# Patient Record
Sex: Male | Born: 1955
Health system: Southern US, Community
[De-identification: ages and names within clinical notes are randomized; demographics above are authoritative.]

## PROBLEM LIST (undated history)

## (undated) DIAGNOSIS — J439 Emphysema, unspecified: Secondary | ICD-10-CM

## (undated) DIAGNOSIS — E785 Hyperlipidemia, unspecified: Secondary | ICD-10-CM

## (undated) DIAGNOSIS — I219 Acute myocardial infarction, unspecified: Secondary | ICD-10-CM

## (undated) DIAGNOSIS — M1990 Unspecified osteoarthritis, unspecified site: Secondary | ICD-10-CM

## (undated) DIAGNOSIS — F32A Depression, unspecified: Secondary | ICD-10-CM

## (undated) DIAGNOSIS — F329 Major depressive disorder, single episode, unspecified: Secondary | ICD-10-CM

## (undated) DIAGNOSIS — J449 Chronic obstructive pulmonary disease, unspecified: Secondary | ICD-10-CM

## (undated) DIAGNOSIS — F419 Anxiety disorder, unspecified: Secondary | ICD-10-CM

## (undated) DIAGNOSIS — I10 Essential (primary) hypertension: Secondary | ICD-10-CM

## (undated) DIAGNOSIS — T7840XA Allergy, unspecified, initial encounter: Secondary | ICD-10-CM

## (undated) DIAGNOSIS — F410 Panic disorder [episodic paroxysmal anxiety] without agoraphobia: Secondary | ICD-10-CM

## (undated) DIAGNOSIS — K589 Irritable bowel syndrome without diarrhea: Secondary | ICD-10-CM

## (undated) DIAGNOSIS — D649 Anemia, unspecified: Secondary | ICD-10-CM

## (undated) DIAGNOSIS — F909 Attention-deficit hyperactivity disorder, unspecified type: Secondary | ICD-10-CM

## (undated) DIAGNOSIS — I2510 Atherosclerotic heart disease of native coronary artery without angina pectoris: Secondary | ICD-10-CM

## (undated) DIAGNOSIS — N400 Enlarged prostate without lower urinary tract symptoms: Secondary | ICD-10-CM

## (undated) DIAGNOSIS — K219 Gastro-esophageal reflux disease without esophagitis: Secondary | ICD-10-CM

## (undated) DIAGNOSIS — M797 Fibromyalgia: Secondary | ICD-10-CM

## (undated) HISTORY — DX: Fibromyalgia: M79.7

## (undated) HISTORY — DX: Emphysema, unspecified: J43.9

## (undated) HISTORY — DX: Irritable bowel syndrome, unspecified: K58.9

## (undated) HISTORY — DX: Hyperlipidemia, unspecified: E78.5

## (undated) HISTORY — DX: Essential (primary) hypertension: I10

## (undated) HISTORY — DX: Acute myocardial infarction, unspecified: I21.9

## (undated) HISTORY — DX: Anemia, unspecified: D64.9

## (undated) HISTORY — DX: Allergy, unspecified, initial encounter: T78.40XA

## (undated) HISTORY — DX: Chronic obstructive pulmonary disease, unspecified: J44.9

## (undated) HISTORY — PX: CARDIAC CATHETERIZATION: SHX172

## (undated) HISTORY — DX: Gastro-esophageal reflux disease without esophagitis: K21.9

## (undated) HISTORY — PX: NO PAST SURGERIES: SHX2092

---

## 1998-05-26 ENCOUNTER — Encounter (HOSPITAL_COMMUNITY): Admission: RE | Admit: 1998-05-26 | Discharge: 1998-08-24 | Payer: Self-pay | Admitting: Psychiatry

## 1998-07-04 ENCOUNTER — Emergency Department (HOSPITAL_COMMUNITY): Admission: EM | Admit: 1998-07-04 | Discharge: 1998-07-04 | Payer: Self-pay | Admitting: Emergency Medicine

## 1998-09-30 ENCOUNTER — Inpatient Hospital Stay (HOSPITAL_COMMUNITY): Admission: AD | Admit: 1998-09-30 | Discharge: 1998-10-03 | Payer: Self-pay | Admitting: *Deleted

## 1998-12-24 ENCOUNTER — Emergency Department (HOSPITAL_COMMUNITY): Admission: EM | Admit: 1998-12-24 | Discharge: 1998-12-24 | Payer: Self-pay

## 1999-05-16 ENCOUNTER — Inpatient Hospital Stay (HOSPITAL_COMMUNITY): Admission: EM | Admit: 1999-05-16 | Discharge: 1999-05-19 | Payer: Self-pay | Admitting: *Deleted

## 1999-05-22 ENCOUNTER — Other Ambulatory Visit: Admission: RE | Admit: 1999-05-22 | Discharge: 1999-05-25 | Payer: Self-pay

## 1999-11-08 ENCOUNTER — Emergency Department (HOSPITAL_COMMUNITY): Admission: EM | Admit: 1999-11-08 | Discharge: 1999-11-08 | Payer: Self-pay

## 1999-12-31 ENCOUNTER — Emergency Department (HOSPITAL_COMMUNITY): Admission: EM | Admit: 1999-12-31 | Discharge: 1999-12-31 | Payer: Self-pay | Admitting: Emergency Medicine

## 1999-12-31 ENCOUNTER — Encounter: Payer: Self-pay | Admitting: Internal Medicine

## 2006-01-07 ENCOUNTER — Emergency Department (HOSPITAL_COMMUNITY): Admission: EM | Admit: 2006-01-07 | Discharge: 2006-01-07 | Payer: Self-pay | Admitting: Emergency Medicine

## 2006-02-03 ENCOUNTER — Emergency Department (HOSPITAL_COMMUNITY): Admission: EM | Admit: 2006-02-03 | Discharge: 2006-02-03 | Payer: Self-pay | Admitting: Emergency Medicine

## 2006-02-19 ENCOUNTER — Encounter: Admission: RE | Admit: 2006-02-19 | Discharge: 2006-02-19 | Payer: Self-pay | Admitting: Internal Medicine

## 2006-04-10 ENCOUNTER — Ambulatory Visit (HOSPITAL_COMMUNITY): Admission: RE | Admit: 2006-04-10 | Discharge: 2006-04-10 | Payer: Self-pay | Admitting: Gastroenterology

## 2006-04-10 ENCOUNTER — Encounter (INDEPENDENT_AMBULATORY_CARE_PROVIDER_SITE_OTHER): Payer: Self-pay | Admitting: *Deleted

## 2007-06-08 ENCOUNTER — Emergency Department (HOSPITAL_COMMUNITY): Admission: EM | Admit: 2007-06-08 | Discharge: 2007-06-09 | Payer: Self-pay | Admitting: Emergency Medicine

## 2008-10-13 ENCOUNTER — Encounter: Admission: RE | Admit: 2008-10-13 | Discharge: 2008-10-13 | Payer: Self-pay | Admitting: Internal Medicine

## 2008-10-26 ENCOUNTER — Emergency Department (HOSPITAL_COMMUNITY): Admission: EM | Admit: 2008-10-26 | Discharge: 2008-10-26 | Payer: Self-pay | Admitting: Emergency Medicine

## 2008-11-02 ENCOUNTER — Emergency Department (HOSPITAL_COMMUNITY): Admission: EM | Admit: 2008-11-02 | Discharge: 2008-11-02 | Payer: Self-pay | Admitting: Emergency Medicine

## 2008-11-15 ENCOUNTER — Encounter: Admission: RE | Admit: 2008-11-15 | Discharge: 2008-11-15 | Payer: Self-pay | Admitting: Internal Medicine

## 2009-03-30 ENCOUNTER — Emergency Department (HOSPITAL_COMMUNITY): Admission: EM | Admit: 2009-03-30 | Discharge: 2009-03-30 | Payer: Self-pay | Admitting: Emergency Medicine

## 2009-05-25 ENCOUNTER — Encounter: Payer: Self-pay | Admitting: Cardiovascular Disease

## 2009-09-22 ENCOUNTER — Encounter: Payer: Self-pay | Admitting: Cardiovascular Disease

## 2009-10-12 DIAGNOSIS — R079 Chest pain, unspecified: Secondary | ICD-10-CM | POA: Insufficient documentation

## 2009-10-12 DIAGNOSIS — M19041 Primary osteoarthritis, right hand: Secondary | ICD-10-CM | POA: Insufficient documentation

## 2009-10-12 DIAGNOSIS — M19042 Primary osteoarthritis, left hand: Secondary | ICD-10-CM | POA: Insufficient documentation

## 2009-11-01 ENCOUNTER — Ambulatory Visit: Payer: Self-pay | Admitting: Cardiovascular Disease

## 2009-11-01 DIAGNOSIS — R072 Precordial pain: Secondary | ICD-10-CM | POA: Insufficient documentation

## 2009-11-18 ENCOUNTER — Ambulatory Visit: Payer: Self-pay

## 2009-11-18 ENCOUNTER — Ambulatory Visit (HOSPITAL_COMMUNITY): Admission: RE | Admit: 2009-11-18 | Discharge: 2009-11-18 | Payer: Self-pay | Admitting: Cardiovascular Disease

## 2009-11-18 ENCOUNTER — Ambulatory Visit: Payer: Self-pay | Admitting: Cardiovascular Disease

## 2009-11-18 ENCOUNTER — Encounter: Payer: Self-pay | Admitting: Cardiovascular Disease

## 2009-11-21 ENCOUNTER — Telehealth: Payer: Self-pay | Admitting: Cardiovascular Disease

## 2009-11-22 ENCOUNTER — Encounter: Payer: Self-pay | Admitting: Cardiovascular Disease

## 2009-12-06 ENCOUNTER — Ambulatory Visit: Payer: Self-pay | Admitting: Cardiovascular Disease

## 2010-02-14 ENCOUNTER — Encounter: Admission: RE | Admit: 2010-02-14 | Discharge: 2010-02-14 | Payer: Self-pay | Admitting: Internal Medicine

## 2010-04-11 ENCOUNTER — Encounter
Admission: RE | Admit: 2010-04-11 | Discharge: 2010-04-11 | Payer: Self-pay | Source: Home / Self Care | Attending: Internal Medicine | Admitting: Internal Medicine

## 2010-05-15 ENCOUNTER — Encounter: Payer: Self-pay | Admitting: Internal Medicine

## 2010-05-23 NOTE — Progress Notes (Signed)
Summary: test result  Phone Note Call from Patient Call back at Home Phone [phone]   Caller: Patient Reason for Call: Talk to Nurse, Lab or Test Results Complaint: Breathing Problems Initial call taken by: Lorne Skeens,  November 21, 2009 12:33 PM  Follow-up for Phone Call        Phone Call Completed PT AWARE OF TEST RESULTS AND OF APPT WITH DR Clifton James. Follow-up by: Scherrie Bateman, LPN,  November 21, 2009 12:41 PM

## 2010-05-23 NOTE — Letter (Signed)
Summary: [facility]  [facility]   Imported By: Marylou Mccoy 11/17/2009 10:17:10  _____________________________________________________________________  External Attachment:    Type:   Image     Comment:   External Document

## 2010-05-23 NOTE — Assessment & Plan Note (Signed)
Summary: 1 month rov/sl   Visit Type:  1 mo Referring Provider:  Dr Lonia Blood Primary Provider:  Dr Lonia Blood  CC:  pt states he had a "big panic attack" after the stress test...edema/ankles/hands says due to osteoarthritis...denies any sob.  History of Present Illness: 55 yo WM with history of borderline hyperlipidemia, anxiety and panic disorder who is here today for cardiac follow up. He told  me at his initial visit in July 2011  that he was  pedaling his stationary bicycle after smoking a cigarette and had a slight chest discomfort. This lasted for a few seconds. There was no associated SOB, diaphoresis. This occurred in February. When he becomes anxious, he has the onset of chest pressure. This is substernal without radiation. There is associated tachypnia. No dizziness or syncope. I arranged a stress echo which showed normal exercise tolerance with target heart rate achieved, no ischemic wall motion changes or EKG changes. He has had a few panic attacks since last visit but has been feeling well. He is still smoking but is trying to stop.   Current Medications (verified): 1)  Xanax 1 Mg Tabs (Alprazolam) .... Five Times A Day 2)  Gabapentin 400 Mg Caps (Gabapentin) .... Four Times A Day 3)  Lexapro 10 Mg Tabs (Escitalopram Oxalate) .... Once Daily 4)  Prevacid 15 Mg Cpdr (Lansoprazole) .... Once Daily  Allergies: 1)  ! Tetracycline  Past History:  Past Medical History: Reviewed history from 11/01/2009 and no changes required. Anxiety/Panic disorder Psoriasis Osteoarthritis Borderline hyperlipidemia Hiatal hernia/GERD Depression  Social History: Reviewed history from 11/01/2009 and no changes required. Tobacco Use - Yes. 3/4 packs per day currently. He had smoked 1.5 to 2 ppd for 37 years. 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  The patient  denies fatigue, malaise, fever, weight gain/loss, vision loss, decreased hearing, hoarseness, palpitations, shortness of breath, prolonged cough, wheezing, sleep apnea, coughing up blood, abdominal pain, blood in stool, nausea, vomiting, diarrhea, heartburn, incontinence, blood in urine, muscle weakness, joint pain, leg swelling, rash, skin lesions, headache, fainting, dizziness, depression, enlarged lymph nodes, easy bruising or bleeding, and environmental allergies.    Vital Signs:  Patient profile:   55 year old male Height:      67 inches Weight:      184 pounds Pulse rate:   88 / minute Pulse rhythm:   regular BP sitting:   112 / 70  (left arm) Cuff size:   large  Vitals Entered By: Danielle Rankin, CMA (December 06, 2009 3:38 PM)  Physical Exam  General:  General: Well developed, well nourished, NAD Musculoskeletal: Muscle strength 5/5 all ext Psychiatric: Mood and affect normal Neck: No JVD, no carotid bruits, no thyromegaly, no lymphadenopathy. Lungs:Clear bilaterally, no wheezes, rhonci, crackles CV: RRR no murmurs, gallops rubs Abdomen: soft, NT, ND, BS present Extremities: No edema, pulses 2+.    Stress Echocardiogram  Procedure date:  11/18/2009  Findings:      Stress results: Maximal heart rate during stress was 157bpm (95% of maximal predicted heart rate). The maximal predicted heart rate was 165bpm. The target heart rate was achieved. The heart rate response to stress was normal. There was a normal resting blood pressure with an appropriate response to stress. The rate-pressure product for the peak heart rate and blood pressure was Hg/min.  The patient experienced no chest pain during stress.  -------------------------------------------------------------------- Stress ECG: There were no stress arrhythmias or conduction abnormalities. The stress ECG was negative for  ischemia.  -------------------------------------------------------------------- Baseline:  - LV global systolic function was normal. - Normal wall motion; no LV regional wall motion abnormalities. Peak stress:  - LV global systolic function was vigorous. - Normal wall motion; no LV regional wall motion abnormalities.  -------------------------------------------------------------------- Stress echo results: Left ventricular ejection fraction was normal at rest and with stress. There was no echocardiographic evidence for stress-induced ischemia.  Impression & Recommendations:  Problem # 1:  CHEST PAIN-PRECORDIAL (ICD-786.51) No evidence of ischemia on stress echo. Chest pain most likely related to anxiety. I have discussed lifestyle modification with change in diet, daily exercise and complete tobacco cessation. He is in agreement. No further cardiac workup at this time.   Patient Instructions: 1)  Your physician recommends that you schedule a follow-up appointment as needed 2)  Your physician recommends that you continue on your current medications as directed. Please refer to the Current Medication list given to you today.

## 2010-05-23 NOTE — Assessment & Plan Note (Signed)
Summary: np6/chest pain/eval for stress test   Visit Type:  Initial Consult Referring Provider:  Dr Lonia Blood Primary Provider:  Dr Lonia Blood  CC:  chest pain.  History of Present Illness: 55 yo WM with history of borderline hyperlipidemia, anxiety and panic disorder who is here today for cardiac evaluation. He tells me that he was recently pedaling his stationary bicycle after smoking a cigarette and had a slight discomfort. This lasted for a few seconds. There was no associated SOB, diaphoresis. This occurred in February. When he becomes anxious, he has the onset of chest pressure. This is substernal without radiation. There is associated tachypneia. No dizziness or syncope.   Preventive Screening-Counseling & Management  Caffeine-Diet-Exercise     Does Patient Exercise: no  Current Medications (verified): 1)  Xanax 1 Mg Tabs (Alprazolam) .... Five Times A Day 2)  Gabapentin 400 Mg Caps (Gabapentin) .... Four Times A Day 3)  Lexapro 10 Mg Tabs (Escitalopram Oxalate) .... Once Daily 4)  Prevacid 15 Mg Cpdr (Lansoprazole) .... Once Daily  Allergies (verified): 1)  ! Tetracycline  Past History:  Past Medical History: Anxiety/Panic disorder Psoriasis Osteoarthritis Borderline hyperlipidemia Hiatal hernia/GERD Depression  Past Surgical History: None  Family History: Mother alive at age 25-DM, dementia Father deceased age 9-pancreatic cancer Brother alive, age 7-healthy  Social History: Tobacco Use - Yes. 3/4 packs per day currently. He had smoked 1.5 to 2 ppd for 37 years. 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  The patient denies fatigue, malaise, fever, weight gain/loss, vision loss, decreased hearing, hoarseness, palpitations, shortness of breath, prolonged cough, wheezing, sleep apnea, coughing up blood, abdominal pain,  blood in stool, nausea, vomiting, diarrhea, heartburn, incontinence, blood in urine, muscle weakness, joint pain, leg swelling, rash, skin lesions, headache, fainting, dizziness, depression, anxiety, enlarged lymph nodes, easy bruising or bleeding, and environmental allergies.    Vital Signs:  Patient profile:   55 year old male Height:      67 inches Weight:      182 pounds BMI:     28.61 Pulse rate:   99 / minute BP sitting:   114 / 76  (left arm) Cuff size:   regular  Vitals Entered By: Hardin Negus, RMA (November 01, 2009 4:33 PM)  Physical Exam  General:  General: Well developed, well nourished, NAD HEENT: OP clear, mucus membranes moist SKIN: warm, dry Neuro: No focal deficits Musculoskeletal: Muscle strength 5/5 all ext Psychiatric: Mood and affect normal Neck: No JVD, no carotid bruits, no thyromegaly, no lymphadenopathy. Lungs:Clear bilaterally, no wheezes, rhonci, crackles ZO:[id number], regular,  no murmurs, gallops rubs Abdomen: soft, NT, ND, BS present Extremities: No edema, pulses 2+.    EKG  Procedure date:  11/01/2009  Findings:      NSR, rate 99 bpm. Normal EKG  Impression & Recommendations:  Problem # 1:  CHEST PAIN-PRECORDIAL (ICD-786.51) Risk factors for obstructive CAD include tobacco abuse and FH of CAD. Symptoms are atypical. Difficult to sort out with his severe anxiety and panic disorder. Will arrange exercise stress echo. Will follow up after testing.   Other Orders: Stress Echo (Stress Echo)  Patient Instructions: 1)  Your physician recommends that you schedule a follow-up appointment in: 3- 4 weeks 2)  Your physician has requested that you have a stress echocardiogram. For  further information please visit [email or web address]  Please follow instruction sheet as given.

## 2010-05-30 ENCOUNTER — Emergency Department (HOSPITAL_COMMUNITY)
Admission: EM | Admit: 2010-05-30 | Discharge: 2010-05-30 | Disposition: A | Discharge status: Home / Self Care | Payer: Medicare Other | Attending: Emergency Medicine | Admitting: Emergency Medicine

## 2010-05-30 DIAGNOSIS — F988 Other specified behavioral and emotional disorders with onset usually occurring in childhood and adolescence: Secondary | ICD-10-CM | POA: Insufficient documentation

## 2010-05-30 DIAGNOSIS — H60399 Other infective otitis externa, unspecified ear: Secondary | ICD-10-CM | POA: Insufficient documentation

## 2010-05-30 DIAGNOSIS — F17200 Nicotine dependence, unspecified, uncomplicated: Secondary | ICD-10-CM | POA: Insufficient documentation

## 2010-07-04 ENCOUNTER — Emergency Department (HOSPITAL_COMMUNITY)
Admission: EM | Admit: 2010-07-04 | Discharge: 2010-07-04 | Disposition: A | Discharge status: Home / Self Care | Payer: Medicare Other | Attending: Emergency Medicine | Admitting: Emergency Medicine

## 2010-07-04 DIAGNOSIS — J029 Acute pharyngitis, unspecified: Secondary | ICD-10-CM | POA: Insufficient documentation

## 2010-07-04 DIAGNOSIS — F988 Other specified behavioral and emotional disorders with onset usually occurring in childhood and adolescence: Secondary | ICD-10-CM | POA: Insufficient documentation

## 2010-07-04 LAB — RAPID STREP SCREEN (MED CTR MEBANE ONLY): Streptococcus, Group A Screen (Direct): NEGATIVE

## 2010-07-25 LAB — DIFFERENTIAL
Basophils Absolute: 0 10*3/uL (ref 0.0–0.1)
Basophils Relative: 1 % (ref 0–1)
Eosinophils Absolute: 0.1 10*3/uL (ref 0.0–0.7)
Eosinophils Relative: 1 % (ref 0–5)
Lymphocytes Relative: 34 % (ref 12–46)
Lymphs Abs: 2.4 10*3/uL (ref 0.7–4.0)
Monocytes Absolute: 0.6 10*3/uL (ref 0.1–1.0)
Monocytes Relative: 8 % (ref 3–12)
Neutro Abs: 4 10*3/uL (ref 1.7–7.7)
Neutrophils Relative %: 56 % (ref 43–77)

## 2010-07-25 LAB — URINE MICROSCOPIC-ADD ON

## 2010-07-25 LAB — POCT I-STAT, CHEM 8
BUN: 12 mg/dL (ref 6–23)
Calcium, Ion: 1.25 mmol/L (ref 1.12–1.32)
Chloride: 104 mEq/L (ref 96–112)
Creatinine, Ser: 0.6 mg/dL (ref 0.4–1.5)
Glucose, Bld: 119 mg/dL — ABNORMAL HIGH (ref 70–99)
HCT: 49 % (ref 39.0–52.0)
Hemoglobin: 16.7 g/dL (ref 13.0–17.0)
Potassium: 3.7 mEq/L (ref 3.5–5.1)
Sodium: 142 mEq/L (ref 135–145)
TCO2: 30 mmol/L (ref 0–100)

## 2010-07-25 LAB — COMPREHENSIVE METABOLIC PANEL
ALT: 41 U/L (ref 0–53)
AST: 32 U/L (ref 0–37)
Albumin: 4 g/dL (ref 3.5–5.2)
Alkaline Phosphatase: 101 U/L (ref 39–117)
BUN: 10 mg/dL (ref 6–23)
CO2: 30 mEq/L (ref 19–32)
Calcium: 9.6 mg/dL (ref 8.4–10.5)
Chloride: 103 mEq/L (ref 96–112)
Creatinine, Ser: 0.81 mg/dL (ref 0.4–1.5)
GFR calc non Af Amer: >60 mL/min (ref >60)
Glucose, Bld: 111 mg/dL — ABNORMAL HIGH (ref 70–99)
Potassium: 3.6 mEq/L (ref 3.5–5.1)
Sodium: 140 mEq/L (ref 135–145)
Total Bilirubin: 0.4 mg/dL (ref 0.3–1.2)
Total Protein: 7.9 g/dL (ref 6.0–8.3)

## 2010-07-25 LAB — URINALYSIS, ROUTINE W REFLEX MICROSCOPIC
Bilirubin Urine: NEGATIVE
Glucose, UA: NEGATIVE mg/dL
Ketones, ur: NEGATIVE mg/dL
Leukocytes, UA: NEGATIVE
Nitrite: NEGATIVE
Protein, ur: NEGATIVE mg/dL
Specific Gravity, Urine: 1.018 (ref 1.005–1.030)
Urobilinogen, UA: 0.2 mg/dL (ref 0.0–1.0)
pH: 6 (ref 5.0–8.0)

## 2010-07-25 LAB — CBC
HCT: 48.8 % (ref 39.0–52.0)
Hemoglobin: 16.1 g/dL (ref 13.0–17.0)
MCHC: 33 g/dL (ref 30.0–36.0)
MCV: 97 fL (ref 78.0–100.0)
Platelets: 255 10*3/uL (ref 150–400)
RBC: 5.03 MIL/uL (ref 4.22–5.81)
RDW: 13.6 % (ref 11.5–15.5)
WBC: 7.2 10*3/uL (ref 4.0–10.5)

## 2010-07-25 LAB — URINE CULTURE
Colony Count: NO GROWTH
Culture: NO GROWTH

## 2010-07-25 LAB — PROTIME-INR
INR: 1.05 (ref 0.00–1.49)
Prothrombin Time: 13.6 seconds (ref 11.6–15.2)

## 2010-07-25 LAB — LIPASE, BLOOD: Lipase: 15 U/L (ref 11–59)

## 2010-09-08 NOTE — Consult Note (Signed)
NAMEJABBAR, Kyle Walters              ACCOUNT NO.:  [id number]   MEDICAL RECORD NO.:  [id number]          PATIENT TYPE:  EMS   LOCATION:  ED                           FACILITY:  [facility]   PHYSICIAN:  Bertram Millard. Dahlstedt, M.D.DATE OF BIRTH:  [birth year]   DATE OF CONSULTATION:  01/07/2006  DATE OF DISCHARGE:                                   CONSULTATION   REASON FOR CONSULTATION:  Swollen penis.   BRIEF HISTORY:  A 55 year old male who presented this morning to the  emergency room at [facility] for evaluation of a swollen penis.  The  patient had erotic activity, both two nights ago and yesterday morning.  During intercourse and masturbation, he denied any sudden pain to this  penis.  He was unable to ejaculate Saturday.  He did not have any severe  angulation of his penis urine erections.  He has no significant pain to his  penis at the present time.   He has been treated for prostatitis.  He is in the middle of a 3-week course  of Cipro for prostatitis, diagnosed and treated by [facility] on  [address].  He denies any prior episodes of prostatitis.  He denies any  gross hematuria.  He denies any bloody or purulent urethral discharge.  He  does have a little bit of bladder tenderness.  This has been present for  the past few days.  This may be a little bit worse this morning.  It is not  exacerbated or relieved by anything in particular.  He has had no fever or  chills.   MEDICAL HISTORY:  Is essentially noncontributory.   CURRENT MEDICATIONS:  Cipro.   He is single.  The he has a roommate.  He smokes.  He is a Runner, broadcasting/film/video at  [employer]   The patient typically has a good stream.  He denies any prior episodes of  prostatitis.  He denies any lower intestinal problems.  He has no unplanned  weight gain or weight loss.   PHYSICAL EXAMINATION:  GENERAL: A pleasant, affable middle-aged male.  GU: His phallus was circumcised.  There are two areas of edema of the penile  skin.  The  first area was on the distal penile skin on the right.  This gave  the penis sort of a rams head deformity.  There was some more proximal  swelling, an area of 1-2 cm on the left shaft.  I was unable to palpate any  corporal defect.  There is no ecchymosis.  There was no penile tenderness.  Urethral meatus was normal.  There was no discharge present.  No blood was  present there either.  Penile skin was without erythema.  There was no  inguinal adenopathy.  Scrotal skin, testicles, cords and epididymal  structures were normal.  There are no inguinal hernias.  RECTAL:  Exam was deferred.   Urinalysis is pending.   IMPRESSION:  1. History of prostatitis, currently being treated with Cipro.  2. Penile edema, idiopathic.  I do not think it is related to any trauma.      Perhaps there  was an insect bite, although I do not see any punctate      areas of ecchymosis or erythema.   PLAN:  1. I reassured the patient.  2. Continue full course of Cipro; he has another 5 days left.  3. I will follow him up in about a week.  He knows to call me before then      if it gets any worse.  4. I will have a urinalysis sent on this patient.      Bertram Millard. Dahlstedt, M.D.  Electronically Signed     SMD/MEDQ  D:  01/07/2006  T:  01/07/2006  Job:  [id number]

## 2010-12-04 ENCOUNTER — Emergency Department (HOSPITAL_COMMUNITY): Payer: Medicare Other

## 2010-12-04 ENCOUNTER — Encounter (HOSPITAL_COMMUNITY): Payer: Self-pay

## 2010-12-04 ENCOUNTER — Emergency Department (HOSPITAL_COMMUNITY)
Admission: EM | Admit: 2010-12-04 | Discharge: 2010-12-04 | Disposition: A | Discharge status: Home / Self Care | Payer: Medicare Other | Attending: Emergency Medicine | Admitting: Emergency Medicine

## 2010-12-04 DIAGNOSIS — M1990 Unspecified osteoarthritis, unspecified site: Secondary | ICD-10-CM | POA: Insufficient documentation

## 2010-12-04 DIAGNOSIS — F3289 Other specified depressive episodes: Secondary | ICD-10-CM | POA: Insufficient documentation

## 2010-12-04 DIAGNOSIS — F988 Other specified behavioral and emotional disorders with onset usually occurring in childhood and adolescence: Secondary | ICD-10-CM | POA: Insufficient documentation

## 2010-12-04 DIAGNOSIS — R319 Hematuria, unspecified: Secondary | ICD-10-CM | POA: Insufficient documentation

## 2010-12-04 DIAGNOSIS — R1031 Right lower quadrant pain: Secondary | ICD-10-CM | POA: Insufficient documentation

## 2010-12-04 DIAGNOSIS — R509 Fever, unspecified: Secondary | ICD-10-CM | POA: Insufficient documentation

## 2010-12-04 DIAGNOSIS — F329 Major depressive disorder, single episode, unspecified: Secondary | ICD-10-CM | POA: Insufficient documentation

## 2010-12-04 LAB — URINE MICROSCOPIC-ADD ON

## 2010-12-04 LAB — BASIC METABOLIC PANEL
BUN: 9 mg/dL (ref 6–23)
CO2: 28 mEq/L (ref 19–32)
Calcium: 10 mg/dL (ref 8.4–10.5)
Chloride: 103 mEq/L (ref 96–112)
Creatinine, Ser: 0.82 mg/dL (ref 0.50–1.35)
GFR calc Af Amer: >60 mL/min (ref >60)
GFR calc non Af Amer: >60 mL/min (ref >60)
Glucose, Bld: 86 mg/dL (ref 70–99)
Potassium: 3.6 mEq/L (ref 3.5–5.1)
Sodium: 140 mEq/L (ref 135–145)

## 2010-12-04 LAB — CBC
HCT: 45.6 % (ref 39.0–52.0)
Hemoglobin: 15.8 g/dL (ref 13.0–17.0)
MCH: 31.5 pg (ref 26.0–34.0)
MCHC: 34.6 g/dL (ref 30.0–36.0)
MCV: 91 fL (ref 78.0–100.0)
Platelets: 250 10*3/uL (ref 150–400)
RBC: 5.01 MIL/uL (ref 4.22–5.81)
RDW: 13.3 % (ref 11.5–15.5)
WBC: 9 10*3/uL (ref 4.0–10.5)

## 2010-12-04 LAB — URINALYSIS, ROUTINE W REFLEX MICROSCOPIC
Bilirubin Urine: NEGATIVE
Glucose, UA: NEGATIVE mg/dL
Ketones, ur: NEGATIVE mg/dL
Leukocytes, UA: NEGATIVE
Nitrite: NEGATIVE
Protein, ur: NEGATIVE mg/dL
Specific Gravity, Urine: 1.013 (ref 1.005–1.030)
Urobilinogen, UA: 0.2 mg/dL (ref 0.0–1.0)
pH: 5 (ref 5.0–8.0)

## 2010-12-04 LAB — DIFFERENTIAL
Basophils Absolute: 0.1 10*3/uL (ref 0.0–0.1)
Basophils Relative: 1 % (ref 0–1)
Eosinophils Absolute: 0.1 10*3/uL (ref 0.0–0.7)
Eosinophils Relative: 1 % (ref 0–5)
Lymphocytes Relative: 36 % (ref 12–46)
Lymphs Abs: 3.2 10*3/uL (ref 0.7–4.0)
Monocytes Absolute: 1 10*3/uL (ref 0.1–1.0)
Monocytes Relative: 11 % (ref 3–12)
Neutro Abs: 4.6 10*3/uL (ref 1.7–7.7)
Neutrophils Relative %: 51 % (ref 43–77)

## 2010-12-04 MED ORDER — IOHEXOL 300 MG/ML  SOLN
100.0000 mL | Freq: Once | INTRAMUSCULAR | Status: AC | PRN
  Administered 2010-12-04: 100 mL via INTRAVENOUS

## 2011-02-18 ENCOUNTER — Emergency Department (HOSPITAL_COMMUNITY)
Admission: EM | Admit: 2011-02-18 | Discharge: 2011-02-18 | Disposition: A | Discharge status: Home / Self Care | Payer: Medicare Other | Attending: Emergency Medicine | Admitting: Emergency Medicine

## 2011-02-18 DIAGNOSIS — Z79899 Other long term (current) drug therapy: Secondary | ICD-10-CM | POA: Insufficient documentation

## 2011-02-18 DIAGNOSIS — F329 Major depressive disorder, single episode, unspecified: Secondary | ICD-10-CM | POA: Insufficient documentation

## 2011-02-18 DIAGNOSIS — R339 Retention of urine, unspecified: Secondary | ICD-10-CM | POA: Insufficient documentation

## 2011-02-18 DIAGNOSIS — M1990 Unspecified osteoarthritis, unspecified site: Secondary | ICD-10-CM | POA: Insufficient documentation

## 2011-02-18 DIAGNOSIS — F988 Other specified behavioral and emotional disorders with onset usually occurring in childhood and adolescence: Secondary | ICD-10-CM | POA: Insufficient documentation

## 2011-02-18 DIAGNOSIS — F3289 Other specified depressive episodes: Secondary | ICD-10-CM | POA: Insufficient documentation

## 2011-02-18 LAB — DIFFERENTIAL
Basophils Absolute: 0.1 10*3/uL (ref 0.0–0.1)
Basophils Relative: 2 % — ABNORMAL HIGH (ref 0–1)
Eosinophils Absolute: 0.1 10*3/uL (ref 0.0–0.7)
Eosinophils Relative: 2 % (ref 0–5)
Lymphocytes Relative: 37 % (ref 12–46)
Lymphs Abs: 2.8 10*3/uL (ref 0.7–4.0)
Monocytes Absolute: 0.9 10*3/uL (ref 0.1–1.0)
Monocytes Relative: 12 % (ref 3–12)
Neutro Abs: 3.8 10*3/uL (ref 1.7–7.7)
Neutrophils Relative %: 48 % (ref 43–77)

## 2011-02-18 LAB — URINALYSIS, ROUTINE W REFLEX MICROSCOPIC
Glucose, UA: NEGATIVE mg/dL
Leukocytes, UA: NEGATIVE
Nitrite: NEGATIVE
Protein, ur: NEGATIVE mg/dL
Specific Gravity, Urine: 1.022 (ref 1.005–1.030)
Urobilinogen, UA: 0.2 mg/dL (ref 0.0–1.0)
pH: 5.5 (ref 5.0–8.0)

## 2011-02-18 LAB — URINE MICROSCOPIC-ADD ON

## 2011-02-18 LAB — BASIC METABOLIC PANEL
BUN: 13 mg/dL (ref 6–23)
CO2: 32 mEq/L (ref 19–32)
Calcium: 10.2 mg/dL (ref 8.4–10.5)
Chloride: 103 mEq/L (ref 96–112)
Creatinine, Ser: 0.82 mg/dL (ref 0.50–1.35)
GFR calc Af Amer: >90 mL/min (ref >90)
GFR calc non Af Amer: >90 mL/min (ref >90)
Glucose, Bld: 89 mg/dL (ref 70–99)
Potassium: 4.3 mEq/L (ref 3.5–5.1)
Sodium: 142 mEq/L (ref 135–145)

## 2011-02-18 LAB — CBC
HCT: 46.7 % (ref 39.0–52.0)
Hemoglobin: 16.1 g/dL (ref 13.0–17.0)
MCH: 31.4 pg (ref 26.0–34.0)
MCHC: 34.5 g/dL (ref 30.0–36.0)
MCV: 91 fL (ref 78.0–100.0)
Platelets: 306 10*3/uL (ref 150–400)
RBC: 5.13 MIL/uL (ref 4.22–5.81)
RDW: 13.2 % (ref 11.5–15.5)
WBC: 7.8 10*3/uL (ref 4.0–10.5)

## 2011-02-19 LAB — URINE CULTURE
Colony Count: NO GROWTH
Culture  Setup Time: 201210282157
Culture: NO GROWTH

## 2011-08-24 ENCOUNTER — Encounter (HOSPITAL_COMMUNITY): Payer: Self-pay

## 2011-08-24 ENCOUNTER — Emergency Department (HOSPITAL_COMMUNITY)
Admission: EM | Admit: 2011-08-24 | Discharge: 2011-08-24 | Disposition: A | Discharge status: Home / Self Care | Payer: Medicare Other | Attending: Emergency Medicine | Admitting: Emergency Medicine

## 2011-08-24 ENCOUNTER — Emergency Department (HOSPITAL_COMMUNITY): Payer: Medicare Other

## 2011-08-24 DIAGNOSIS — F17200 Nicotine dependence, unspecified, uncomplicated: Secondary | ICD-10-CM | POA: Insufficient documentation

## 2011-08-24 DIAGNOSIS — I861 Scrotal varices: Secondary | ICD-10-CM | POA: Insufficient documentation

## 2011-08-24 DIAGNOSIS — N509 Disorder of male genital organs, unspecified: Secondary | ICD-10-CM | POA: Insufficient documentation

## 2011-08-24 HISTORY — DX: Anxiety disorder, unspecified: F41.9

## 2011-08-24 HISTORY — DX: Depression, unspecified: F32.A

## 2011-08-24 HISTORY — DX: Unspecified osteoarthritis, unspecified site: M19.90

## 2011-08-24 HISTORY — DX: Benign prostatic hyperplasia without lower urinary tract symptoms: N40.0

## 2011-08-24 HISTORY — DX: Major depressive disorder, single episode, unspecified: F32.9

## 2011-08-24 HISTORY — DX: Attention-deficit hyperactivity disorder, unspecified type: F90.9

## 2011-08-24 HISTORY — DX: Panic disorder (episodic paroxysmal anxiety): F41.0

## 2011-08-24 LAB — URINALYSIS, ROUTINE W REFLEX MICROSCOPIC
Bilirubin Urine: NEGATIVE
Glucose, UA: NEGATIVE mg/dL
Hgb urine dipstick: NEGATIVE
Leukocytes, UA: NEGATIVE
Nitrite: NEGATIVE
Protein, ur: NEGATIVE mg/dL
Specific Gravity, Urine: 1.029 (ref 1.005–1.030)
Urobilinogen, UA: 1 mg/dL (ref 0.0–1.0)
pH: 7.5 (ref 5.0–8.0)

## 2011-08-24 NOTE — ED Notes (Signed)
Patient reports that he was taking a shower and noticed a lump in the right testicle. Patient states the pain is worse and is now radiating into his right groin area.

## 2011-08-24 NOTE — Discharge Instructions (Signed)
Testicular Masses Most testicular masses, such as a growth or a swelling, are benign. This means they are not cancerous. Common types of testicular masses include:   Hydrocele is the most common benign testicular mass in an adult. Hydroceles are generally soft, painless scrotal swellings that are collections of fluid in the scrotal sac. These can rapidly change size as the fluid enters or leaves.   Spermatoceles are generally soft, painless, benign swellings that are cyst-like masses in the scrotum containing fluid. They can rapidly change size as the fluid enters or leaves. They are more prominent while standing or exercising. Sometimes, spermatoceles may cause a sensation of heaviness or a dull ache.   Varicocele is an enlargement of the veins that drain the testicles. This condition can increase the risk of infertility. They are more prominent while standing or exercising. Sometimes, varicoceles may cause a sensation of heaviness or a dull ache.   Inguinal hernia is a bulge caused by a portion of intestine protruding into the scrotum through a weak area in the abdominal muscles. Hernias may or may not be painful. They are soft and usually enlarge with coughing or straining.   Torsion of the testis can cause a testicular mass that develops quickly and is associated with tenderness and/or fever. This is caused by a twisting of the testicle within the sac. It also reduces the blood supply and can destroy the testis if not treated quickly with surgery.   Epididymitis is inflammation of the epididymis (a structure attached to the testicle), usually caused by a sexually transmitted infection or a urinary tract infection. This generally shows up as testicular discomfort and swelling, and may include pain during urination.   Testicular appendages are remnants of tissue on the testis present since birth. A testicular appendage can twist on its blood supply and cause pain. In most cases, this is seen as a  blue dot on the scrotum.  A cancerous growth in the scrotum may first appear as a swelling. There may or may not be pain. The growth usually feels firm and shows up as a growth on the testicle. Any solid, firm growth in a testicle is considered cancer until proven otherwise. Cancer of the testicle most commonly affects men 18 to 57 years old. Risk factors include prior testicular tumor and cryptorchidism (undescended testis). Occasionally, testicular cancer may appear with symptoms (problems) of metastasis. This means the tumor (abnormal growth) has spread and is causing other problems that may include cough, shortness of breath or weight loss. Monthly testicular self-exams are recommended for all men. Get in the habit of examining your own testicles. A good time is while taking a shower. Get to know what your testicles feel like so you will know if there is a new growth or change in them. DIAGNOSIS  See your caregiver if you feel a growth in your testicle. Sometimes, all that is needed to make the diagnosis (determine what is wrong) is a physical exam. Your caregiver may shine a bright light through the scrotum to help make the diagnosis. This is called transillumination. The light will shine easily through a collection of fluid but will usually not shine through a tumor. Other testing, including blood tests and an ultrasound exam, may be done. An ultrasound exam bounces harmless sound waves off the testicles and produces a black and white picture almost like that produced by a camera. Diagnosis of testicular cancer can be made by measuring several substances in the blood, called markers), that may  indicate the presence of certain cancers. TREATMENT  What is wrong determines how it is treated. Small hydroceles and spermatoceles often require no treatment. In some cases, however, they may be treated surgically. Hernias are repaired with surgery. Because epididymitis is usually caused by an infection, it is  usually treated with antibiotics. Varicoceles may be treated by surgery to tie off the affected veins. Testicular cancer treatment depends upon the type of cancer. Sometimes, some tissue is removed surgically as a way of trying to preserve the testicle but if a tumor is suspected, the preferred treatment is removal of the entire testicle. Further treatment may include watching the growth with strict follow-up, chemotherapy or radiation. If a growth has been found in a testicle, your caregiver will help you determine the best treatment. Document Released: 10/14/2002 Document Revised: 03/29/2011 Document Reviewed: 04/09/2005   [facility] Patient Information 2012 [place], [place].Varicocele A varicocele is a swelling of veins in the scrotum (the bag of skin that contains the testicles). It is most common in [place] men. It occurs most often on the left side. Small or painless varicoceles do not need treatment. Most often, this is not a serious problem, but further tests may be needed to confirm the diagnosis. Surgery may be needed if complications of varicoceles arise. Rarely, varicoceles can reoccur after surgery. CAUSES  The swelling is due to blood backing up in the vein that leads from the testicle back to the body. Blood backs up because the valves inside the vein are not working properly. Veins normally return blood to the heart. Valves in veins are supposed to be one-way valves. They should not allow blood to flow backwards. If the valves do not work well, blood can pool in a vein and make it swell. The same thing happens with varicose veins in the leg. SYMPTOMS  A varicocele most often causes no symptoms. When they occur, symptoms include:   Swelling on one side of the scrotum.   Swelling that is more obvious when standing up.   A lumpy feeling in the scrotum.   Heaviness on one side of the scrotum.   Dull ache in the scrotum, especially after exercise or prolonged standing or sitting.   Slower  growth or reduced size of the testicle on the side of the varicocele (in [place] males).   Problems with fertility can arise if the testicle does not grow normally.  DIAGNOSIS  Varicocele is usually diagnosed by a physical exam. Sometimes ultrasonography is done. TREATMENT  Usually, varicoceles need no treatment. They are often routinely monitored on exam by your caregiver to ensure they do not slow the growth of the testicle on that side. Treatment may be needed if:  The varicocele is large.   There is a lot of pain.   The varicocele causes a decrease in the size of the testicle in a growing adolescent.   The other testicle is absent or not normal.   Varicoceles are found on both sides of the scrotum.   There is pain when exercising.   There are fertility problems.  There are two types of treatment:  Surgery. The surgeon ties off the swollen veins. Surgery may be done with an incision in the skin or through a laparoscope. The surgery is usually done in an outpatient setting. Outpatient means there is no overnight stay in a hospital.   Embolization. A small tube is placed in a vein and guided into the swollen veins. X-rays are used to guide the small tube.  Tiny metal coils or other blocking items are put through the tube. This blocks swollen veins and the flow of blood. This is usually done in an outpatient setting without the use of general anesthesia.  HOME CARE INSTRUCTIONS  To decrease discomfort:  Wear supportive underwear.   Use an athletic supporter for sports.   Only take over-the-counter or prescription medicines for pain or discomfort as directed by your caregiver.  SEEK MEDICAL CARE IF:   Pain is increasing.   Swelling does not decrease when lying down.   Testicle is smaller.   The testicle becomes enlarged, swollen, red, or painful.  Document Released: 07/16/2000 Document Revised: 03/29/2011 Document Reviewed: 07/20/2009 [facility] Patient Information 2012  [place], [place].

## 2011-08-24 NOTE — ED Notes (Signed)
Patient transported to and back from Ultrasound.  NAD noted.

## 2011-08-24 NOTE — ED Provider Notes (Signed)
History    55yM with with testicular pain. Gradual onset yesterday. Noticed lump R testicle in shower yesterday. Dull ache with radiation to R inguinal region. No rash. No swelling. Hx of epididymitis about 20y ago. Doesn't remember if current symptoms feel similar. Sexually active in monogamous relationship. Hx of prostate enlargement. No acute urinary complaints. No abdominal or back pain. No discharge. No n/v.  CSN: [id number]  Arrival date & time 08/24/11  1326   First MD Initiated Contact with Patient 08/24/11 1450      Chief Complaint  Patient presents with  . Testicle Pain    (Consider location/radiation/quality/duration/timing/severity/associated sxs/prior treatment) HPI  Past Medical History  Diagnosis Date  . Arthritis   . Enlarged prostate   . Anxiety panic attack  . Panic attack   . Depression   . ADHD (attention deficit hyperactivity disorder)   . Migraine     History reviewed. No pertinent past surgical history.  Family History  Problem Relation Age of Onset  . Cancer Mother   . Diabetes Mother   . Osteoarthritis Mother   . Cancer Father   . Diabetes Father   . Stroke Father   . Hypertension Father     History  Substance Use Topics  . Smoking status: Current Everyday Smoker -- 1.0 packs/day for 38 years  . Smokeless tobacco: Not on file  . Alcohol Use: No      Review of Systems  Review of symptoms negative unless otherwise noted in HPI.  Allergies  Bactrim; Tetracycline; and Effexor  Home Medications   Current Outpatient Rx  Name Route Sig Dispense Refill  . ALPRAZOLAM 1 MG PO TABS Oral Take 1 mg by mouth 5 (five) times daily.    Marland Kitchen GABAPENTIN 400 MG PO CAPS Oral Take 400 mg by mouth 4 (four) times daily.    Marland Kitchen LANSOPRAZOLE 15 MG PO CPDR Oral Take 15 mg by mouth daily.    . METHYLPHENIDATE HCL 10 MG PO TABS Oral Take 10 mg by mouth 2 (two) times daily.    Marland Kitchen SILODOSIN 8 MG PO CAPS Oral Take 8 mg by mouth daily with breakfast.    .  SUMATRIPTAN SUCCINATE 50 MG PO TABS Oral Take 50 mg by mouth every 2 (two) hours as needed. For migraine      BP 132/74  Pulse 84  Temp(Src) 98.7 F (37.1 C) (Oral)  Resp 16  SpO2 98%  Physical Exam  Nursing note and vitals reviewed. Constitutional: He appears well-developed and well-nourished. No distress.  HENT:  Head: Normocephalic and atraumatic.  Eyes: Conjunctivae are normal. Right eye exhibits no discharge. Left eye exhibits no discharge.  Neck: Neck supple.  Cardiovascular: Normal rate, regular rhythm and normal heart sounds.  Exam reveals no gallop and no friction rub.   No murmur heard. Pulmonary/Chest: Effort normal and breath sounds normal. No respiratory distress.  Abdominal: Soft. He exhibits no distension. There is no tenderness.  Genitourinary:       Normal external male genitalia. Circumsized. No scrotal swelling or skin changes. No discharge. No testicular lesions palpated. Normal lie.Tenderness R inguinal canal but no hernia appreciated with valsalva. No inguinal adenopathy. No CVA tenderness.  Musculoskeletal: He exhibits no edema and no tenderness.  Neurological: He is alert.  Skin: Skin is warm and dry.  Psychiatric: He has a normal mood and affect. His behavior is normal. Thought content normal.    ED Course  Procedures (including critical care time)   Labs Reviewed  URINALYSIS,  ROUTINE W REFLEX MICROSCOPIC   US Scrotum  08/24/2011  *RADIOLOGY REPORT*  Clinical Data:  Bilateral testicular pain  SCROTAL ULTRASOUND DOPPLER ULTRASOUND OF THE TESTICLES  Technique: Complete ultrasound examination of the testicles, epididymis, and other scrotal structures was performed.  Color and spectral Doppler ultrasound were also utilized to evaluate blood flow to the testicles.  Comparison:  None  Findings:  Right testis:  Normal in size and appearance, measuring 3.9 x 1.8 x 2.8 cm.  Left testis:  Normal in size and appearance, measuring 3.4 x 1.6 x 3.1 cm.  Right  epididymis:  3 mm right epididymal head cyst.  Otherwise normal.  Left epididymis:  Normal in size and appearance.  Hydocele:  Physiologic fluid bilaterally.  Varicocele:  Right varicocele.  Pulsed Doppler interrogation of both testes demonstrates low resistance flow bilaterally.  IMPRESSION: Normal sonographic appearance of the bilateral testes.  Right varicocele.  No evidence of testicular torsion.  Original Report Authenticated By: Charline Bills, M.D.   [place] Art/ven Flow Abd Pelv Doppler  08/24/2011  *RADIOLOGY REPORT*  Clinical Data:  Bilateral testicular pain  SCROTAL ULTRASOUND DOPPLER ULTRASOUND OF THE TESTICLES  Technique: Complete ultrasound examination of the testicles, epididymis, and other scrotal structures was performed.  Color and spectral Doppler ultrasound were also utilized to evaluate blood flow to the testicles.  Comparison:  None  Findings:  Right testis:  Normal in size and appearance, measuring 3.9 x 1.8 x 2.8 cm.  Left testis:  Normal in size and appearance, measuring 3.4 x 1.6 x 3.1 cm.  Right epididymis:  3 mm right epididymal head cyst.  Otherwise normal.  Left epididymis:  Normal in size and appearance.  Hydocele:  Physiologic fluid bilaterally.  Varicocele:  Right varicocele.  Pulsed Doppler interrogation of both testes demonstrates low resistance flow bilaterally.  IMPRESSION: Normal sonographic appearance of the bilateral testes.  Right varicocele.  No evidence of testicular torsion.  Original Report Authenticated By: Charline Bills, M.D.     1. Right varicocele       MDM  [phone] with R testicular pain. Likely 2/2 varicocele. Discussed findings with pt. Plan symptomatic tx.        Raeford Razor, MD 08/25/11 0030

## 2011-08-24 NOTE — ED Notes (Signed)
Pt aware we need urine sample. Will attempt when RN is finished in room. Pt given cup

## 2011-09-11 ENCOUNTER — Emergency Department (HOSPITAL_COMMUNITY): Payer: Medicare Other

## 2011-09-11 ENCOUNTER — Emergency Department (HOSPITAL_COMMUNITY)
Admission: EM | Admit: 2011-09-11 | Discharge: 2011-09-12 | Disposition: A | Discharge status: Home / Self Care | Payer: Medicare Other | Attending: Emergency Medicine | Admitting: Emergency Medicine

## 2011-09-11 ENCOUNTER — Encounter (HOSPITAL_COMMUNITY): Payer: Self-pay

## 2011-09-11 DIAGNOSIS — R202 Paresthesia of skin: Secondary | ICD-10-CM

## 2011-09-11 DIAGNOSIS — F341 Dysthymic disorder: Secondary | ICD-10-CM | POA: Insufficient documentation

## 2011-09-11 DIAGNOSIS — R269 Unspecified abnormalities of gait and mobility: Secondary | ICD-10-CM | POA: Insufficient documentation

## 2011-09-11 DIAGNOSIS — F909 Attention-deficit hyperactivity disorder, unspecified type: Secondary | ICD-10-CM | POA: Insufficient documentation

## 2011-09-11 DIAGNOSIS — R209 Unspecified disturbances of skin sensation: Secondary | ICD-10-CM | POA: Insufficient documentation

## 2011-09-11 DIAGNOSIS — Z79899 Other long term (current) drug therapy: Secondary | ICD-10-CM | POA: Insufficient documentation

## 2011-09-11 DIAGNOSIS — M129 Arthropathy, unspecified: Secondary | ICD-10-CM | POA: Insufficient documentation

## 2011-09-11 DIAGNOSIS — R51 Headache: Secondary | ICD-10-CM

## 2011-09-11 DIAGNOSIS — R200 Anesthesia of skin: Secondary | ICD-10-CM

## 2011-09-11 MED ORDER — ACETAMINOPHEN 325 MG PO TABS
650.0000 mg | ORAL_TABLET | Freq: Once | ORAL | Status: AC
  Administered 2011-09-11: 650 mg via ORAL
  Filled 2011-09-11: qty 2

## 2011-09-11 NOTE — ED Notes (Signed)
Pt complains of right sided numbness

## 2011-09-11 NOTE — ED Provider Notes (Signed)
History     CSN: [id number]  Arrival date & time 09/11/11  2123   First MD Initiated Contact with Patient 09/11/11 2238      Chief Complaint  Patient presents with  . Numbness  . Headache    (Consider location/radiation/quality/duration/timing/severity/associated sxs/prior treatment) HPI Comments: Days he has a history of migraine headaches and lots of environmental smells, odors, noises acting as triggers.  He was at his daughter's graduation tonight with  a number of people,  loud clapping, perfumes, flashing lights that triggered his migraine headache.  He also developed some right-sided numbness and tingling.  His wife states that he was stumbling slightly.  No nausea, no photophobia.  He states he takes 5 mg of Xanax daily for his panic attacks, as well as Neurontin 4 times a day.  He takes Imitrex for his chronic migraine headaches - he took 2 last night and is within a 24-hour period to soon to begin taking  additional medication.  His requesting only Tylenol for his headache, but is concerned about the numbness and tingling  Patient is a 56 y.o. male presenting with headaches. The history is provided by the patient.  Headache  This is a new problem. The current episode started 1 to 2 hours ago. The problem occurs constantly. The problem has been gradually improving. The headache is associated with emotional stress. The pain is located in the right unilateral region. The quality of the pain is described as dull. The pain is at a severity of 3/10. The pain is mild. Pertinent negatives include no fever, no near-syncope, no orthopnea, no palpitations, no syncope, no shortness of breath, no nausea and no vomiting.    Past Medical History  Diagnosis Date  . Arthritis   . Enlarged prostate   . Anxiety panic attack  . Panic attack   . Depression   . ADHD (attention deficit hyperactivity disorder)   . Migraine     History reviewed. No pertinent past surgical history.  Family  History  Problem Relation Age of Onset  . Cancer Mother   . Diabetes Mother   . Osteoarthritis Mother   . Cancer Father   . Diabetes Father   . Stroke Father   . Hypertension Father     History  Substance Use Topics  . Smoking status: Current Everyday Smoker -- 1.0 packs/day for 38 years  . Smokeless tobacco: Not on file  . Alcohol Use: No      Review of Systems  Constitutional: Negative for fever.  HENT: Negative for neck pain and neck stiffness.   Eyes: Negative for photophobia and visual disturbance.  Respiratory: Negative for shortness of breath.   Cardiovascular: Negative for chest pain, palpitations, orthopnea, leg swelling, syncope and near-syncope.  Gastrointestinal: Negative for nausea and vomiting.  Genitourinary: Negative for dysuria.  Neurological: Positive for numbness and headaches. Negative for dizziness, speech difficulty and weakness.  Psychiatric/Behavioral: The patient is nervous/anxious.     Allergies  Bactrim; Tetracycline; and Effexor  Home Medications   Current Outpatient Rx  Name Route Sig Dispense Refill  . ALPRAZOLAM 1 MG PO TABS Oral Take 1 mg by mouth 5 (five) times daily.    Marland Kitchen GABAPENTIN 400 MG PO CAPS Oral Take 400 mg by mouth 4 (four) times daily.    Marland Kitchen LANSOPRAZOLE 15 MG PO CPDR Oral Take 15 mg by mouth daily.    . METHYLPHENIDATE HCL 10 MG PO TABS Oral Take 10 mg by mouth 2 (two) times daily.    Marland Kitchen  SILODOSIN 8 MG PO CAPS Oral Take 8 mg by mouth daily with breakfast.    . SUMATRIPTAN SUCCINATE 50 MG PO TABS Oral Take 50 mg by mouth every 2 (two) hours as needed. For migraine      BP 121/78  Pulse 81  Temp 98.2 F (36.8 C)  Resp 20  SpO2 95%  Physical Exam  Constitutional: He is oriented to person, place, and time. He appears well-developed and well-nourished.  HENT:  Head: Normocephalic and atraumatic.       No sinus tenderness  Eyes: EOM are normal. Pupils are equal, round, and reactive to light.  Neck: Normal range of motion.   Cardiovascular: Normal rate.   Pulmonary/Chest: Effort normal.  Abdominal: Soft.  Musculoskeletal: Normal range of motion.  Neurological: He is alert and oriented to person, place, and time. No cranial nerve deficit.  Skin: Skin is warm and dry.    ED Course  Procedures (including critical care time)  Labs Reviewed - No data to display Ct Head Wo Contrast  09/11/2011  *RADIOLOGY REPORT*  Clinical Data: Right-sided headache  CT HEAD WITHOUT CONTRAST  Technique:  Contiguous axial images were obtained from the base of the skull through the vertex without contrast.  Comparison: None.  Findings: There is no evidence for acute hemorrhage, hydrocephalus, mass lesion, or abnormal extra-axial fluid collection.  No definite CT evidence for acute infarction.  The visualized paranasal sinuses and mastoid air cells are predominately clear.  IMPRESSION: No acute intracranial abnormality.  Original Report Authenticated By: Waneta Martins, M.D.     1. Numbness and tingling of right arm and leg   2. Headache       MDM  Will treat headache with requested Tylenol and CT Head for potential source of numbness although unlikely.  Most likely complex Miagraine headache         Arman Filter, NP 09/12/11 [phone]

## 2011-09-11 NOTE — ED Notes (Signed)
edmd notified pt's c/o numbness

## 2011-09-11 NOTE — ED Notes (Signed)
Pt complains of a headache and numbness since earlier this evening, he states the numbness started about 20 minutes ago, has hx of migraines

## 2011-09-12 NOTE — ED Provider Notes (Signed)
Medical screening examination/treatment/procedure(s) were performed by non-physician practitioner and as supervising physician I was immediately available for consultation/collaboration.  Tanea Moga M Ida Uppal, MD 09/12/11 0721 

## 2011-09-12 NOTE — Discharge Instructions (Signed)
As Discussed your head CT Scan is normal your headache has improved.  If your paresthsia persists please follow up with your PCP for further evaluation

## 2011-10-09 ENCOUNTER — Other Ambulatory Visit: Payer: Self-pay | Admitting: Neurology

## 2011-10-09 DIAGNOSIS — R519 Headache, unspecified: Secondary | ICD-10-CM

## 2011-10-09 DIAGNOSIS — R200 Anesthesia of skin: Secondary | ICD-10-CM

## 2011-10-18 ENCOUNTER — Ambulatory Visit
Admission: RE | Admit: 2011-10-18 | Discharge: 2011-10-18 | Disposition: A | Discharge status: Home / Self Care | Payer: Medicare Other | Source: Ambulatory Visit | Attending: Neurology | Admitting: Neurology

## 2011-10-18 DIAGNOSIS — R200 Anesthesia of skin: Secondary | ICD-10-CM

## 2011-10-18 DIAGNOSIS — R519 Headache, unspecified: Secondary | ICD-10-CM

## 2012-02-14 ENCOUNTER — Ambulatory Visit
Admission: RE | Admit: 2012-02-14 | Discharge: 2012-02-14 | Disposition: A | Discharge status: Home / Self Care | Payer: Medicare Other | Source: Ambulatory Visit | Attending: Internal Medicine | Admitting: Internal Medicine

## 2012-02-14 ENCOUNTER — Other Ambulatory Visit: Payer: Self-pay | Admitting: Internal Medicine

## 2012-02-14 DIAGNOSIS — R262 Difficulty in walking, not elsewhere classified: Secondary | ICD-10-CM

## 2012-03-24 ENCOUNTER — Ambulatory Visit
Admission: RE | Admit: 2012-03-24 | Discharge: 2012-03-24 | Disposition: A | Discharge status: Home / Self Care | Payer: Medicare Other | Source: Ambulatory Visit | Attending: Internal Medicine | Admitting: Internal Medicine

## 2012-03-24 ENCOUNTER — Other Ambulatory Visit: Payer: Self-pay | Admitting: Internal Medicine

## 2012-03-24 DIAGNOSIS — R29898 Other symptoms and signs involving the musculoskeletal system: Secondary | ICD-10-CM

## 2012-03-24 DIAGNOSIS — R52 Pain, unspecified: Secondary | ICD-10-CM

## 2012-09-30 ENCOUNTER — Encounter (HOSPITAL_COMMUNITY): Payer: Self-pay | Admitting: Emergency Medicine

## 2012-09-30 ENCOUNTER — Emergency Department (HOSPITAL_COMMUNITY): Payer: Medicare Other

## 2012-09-30 ENCOUNTER — Emergency Department (HOSPITAL_COMMUNITY)
Admission: EM | Admit: 2012-09-30 | Discharge: 2012-09-30 | Disposition: A | Discharge status: Home / Self Care | Payer: Medicare Other | Attending: Emergency Medicine | Admitting: Emergency Medicine

## 2012-09-30 DIAGNOSIS — F3289 Other specified depressive episodes: Secondary | ICD-10-CM | POA: Insufficient documentation

## 2012-09-30 DIAGNOSIS — R0602 Shortness of breath: Secondary | ICD-10-CM | POA: Insufficient documentation

## 2012-09-30 DIAGNOSIS — F909 Attention-deficit hyperactivity disorder, unspecified type: Secondary | ICD-10-CM | POA: Insufficient documentation

## 2012-09-30 DIAGNOSIS — J438 Other emphysema: Secondary | ICD-10-CM | POA: Insufficient documentation

## 2012-09-30 DIAGNOSIS — Z87891 Personal history of nicotine dependence: Secondary | ICD-10-CM | POA: Insufficient documentation

## 2012-09-30 DIAGNOSIS — Z8739 Personal history of other diseases of the musculoskeletal system and connective tissue: Secondary | ICD-10-CM | POA: Insufficient documentation

## 2012-09-30 DIAGNOSIS — R0600 Dyspnea, unspecified: Secondary | ICD-10-CM

## 2012-09-30 DIAGNOSIS — G43909 Migraine, unspecified, not intractable, without status migrainosus: Secondary | ICD-10-CM | POA: Insufficient documentation

## 2012-09-30 DIAGNOSIS — F410 Panic disorder [episodic paroxysmal anxiety] without agoraphobia: Secondary | ICD-10-CM | POA: Insufficient documentation

## 2012-09-30 DIAGNOSIS — F329 Major depressive disorder, single episode, unspecified: Secondary | ICD-10-CM | POA: Insufficient documentation

## 2012-09-30 DIAGNOSIS — R0609 Other forms of dyspnea: Secondary | ICD-10-CM | POA: Insufficient documentation

## 2012-09-30 DIAGNOSIS — Z79899 Other long term (current) drug therapy: Secondary | ICD-10-CM | POA: Insufficient documentation

## 2012-09-30 DIAGNOSIS — R059 Cough, unspecified: Secondary | ICD-10-CM | POA: Insufficient documentation

## 2012-09-30 DIAGNOSIS — Z87448 Personal history of other diseases of urinary system: Secondary | ICD-10-CM | POA: Insufficient documentation

## 2012-09-30 DIAGNOSIS — R0989 Other specified symptoms and signs involving the circulatory and respiratory systems: Secondary | ICD-10-CM | POA: Insufficient documentation

## 2012-09-30 DIAGNOSIS — R079 Chest pain, unspecified: Secondary | ICD-10-CM | POA: Insufficient documentation

## 2012-09-30 DIAGNOSIS — J439 Emphysema, unspecified: Secondary | ICD-10-CM

## 2012-09-30 DIAGNOSIS — R05 Cough: Secondary | ICD-10-CM | POA: Insufficient documentation

## 2012-09-30 DIAGNOSIS — F411 Generalized anxiety disorder: Secondary | ICD-10-CM | POA: Insufficient documentation

## 2012-09-30 DIAGNOSIS — R450 Nervousness: Secondary | ICD-10-CM | POA: Insufficient documentation

## 2012-09-30 LAB — POCT I-STAT, CHEM 8
BUN: 8 mg/dL (ref 6–23)
Calcium, Ion: 1.21 mmol/L (ref 1.12–1.23)
Chloride: 106 mEq/L (ref 96–112)
Creatinine, Ser: 0.8 mg/dL (ref 0.50–1.35)
Glucose, Bld: 105 mg/dL — ABNORMAL HIGH (ref 70–99)
HCT: 44 % (ref 39.0–52.0)
Hemoglobin: 15 g/dL (ref 13.0–17.0)
Potassium: 3.8 mEq/L (ref 3.5–5.1)
Sodium: 143 mEq/L (ref 135–145)
TCO2: 28 mmol/L (ref 0–100)

## 2012-09-30 LAB — POCT I-STAT TROPONIN I: Troponin i, poc: 0 ng/mL (ref 0.00–0.08)

## 2012-09-30 MED ORDER — ALBUTEROL SULFATE HFA 108 (90 BASE) MCG/ACT IN AERS
2.0000 | INHALATION_SPRAY | Freq: Once | RESPIRATORY_TRACT | Status: AC
  Administered 2012-09-30: 2 via RESPIRATORY_TRACT
  Filled 2012-09-30: qty 6.7

## 2012-09-30 NOTE — Progress Notes (Signed)
During 09/30/12 ED visit Pt confirms pcp is [place] [facility] updated

## 2012-09-30 NOTE — ED Notes (Signed)
Patient transported to X-ray 

## 2012-09-30 NOTE — ED Notes (Addendum)
Pt reports becoming SOB last pm and has been SOB ever since. Pt ambulating and talking in complete sentences. NAD. 95% on room air. Pt smoked for 40 years and concerned he has COPD. Some e cigarettes at this time.

## 2012-09-30 NOTE — ED Provider Notes (Signed)
History     CSN: [id number]  Arrival date & time 09/30/12  1434   First MD Initiated Contact with Patient 09/30/12 1504      Chief Complaint - Shortness of breath   Patient is a 57 y.o. male presenting with shortness of breath. The history is provided by the patient.  Shortness of Breath Severity:  Moderate Onset quality:  Gradual Duration:  12 hours Timing:  Constant Progression:  Improving Chronicity:  New Relieved by:  Nothing Worsened by:  Nothing tried Ineffective treatments:  None tried Associated symptoms: chest pain and cough   Associated symptoms: no abdominal pain, no diaphoresis, no fever, no syncope, no vomiting and no wheezing   Risk factors: tobacco use   patient reports he started having SOB last night while he writing and using an e-cigarette He reports mild chest tightness that is nearly resolved (resolved well over an hour ago) No syncope No pleuritic CP No weakness/dizziness No dyspnea on exertion No orthopnea   He reports his chest tightness he gets "all the time" and has had for years and is not new.  He reports the SOB is new  He reports he used the computer to check on his symptoms and it told him he may have COPD due to his smoking history   He denies h/o CAD/DVT/PE   Past Medical History  Diagnosis Date  . Arthritis   . Enlarged prostate   . Anxiety panic attack  . Panic attack   . Depression   . ADHD (attention deficit hyperactivity disorder)   . Migraine     History reviewed. No pertinent past surgical history.  Family History  Problem Relation Age of Onset  . Cancer Mother   . Diabetes Mother   . Osteoarthritis Mother   . Cancer Father   . Diabetes Father   . Stroke Father   . Hypertension Father     History  Substance Use Topics  . Smoking status: Former Smoker -- 1.00 packs/day for 38 years  . Smokeless tobacco: Not on file  . Alcohol Use: No      Review of Systems  Constitutional: Negative for fever and  diaphoresis.  Respiratory: Positive for cough and shortness of breath. Negative for wheezing.   Cardiovascular: Positive for chest pain. Negative for syncope.  Gastrointestinal: Negative for vomiting and abdominal pain.  Neurological: Negative for dizziness, syncope and weakness.  Psychiatric/Behavioral: The patient is nervous/anxious.   All other systems reviewed and are negative.    Allergies  Tetracycline; Bactrim; and Effexor  Home Medications   Current Outpatient Rx  Name  Route  Sig  Dispense  Refill  . ALPRAZolam (XANAX) 1 MG tablet   Oral   Take 1 mg by mouth 5 (five) times daily.         Marland Kitchen gabapentin (NEURONTIN) 400 MG capsule   Oral   Take 400 mg by mouth 4 (four) times daily.         . lansoprazole (PREVACID) 15 MG capsule   Oral   Take 15 mg by mouth daily.         . methylphenidate (RITALIN) 10 MG tablet   Oral   Take 10 mg by mouth 2 (two) times daily.         . SUMAtriptan (IMITREX) 50 MG tablet   Oral   Take 50 mg by mouth every 2 (two) hours as needed. For migraine           BP 123/78  Pulse 73  Temp(Src) 98.6 F (37 C) (Oral)  Resp 16  Ht 5' 6.5" (1.689 m)  Wt 175 lb (79.379 kg)  BMI 27.83 kg/m2  SpO2 95%  Physical Exam CONSTITUTIONAL: Well developed/well nourished HEAD: Normocephalic/atraumatic EYES: EOMI/PERRL ENMT: Mucous membranes moist NECK: supple no meningeal signs SPINE:entire spine nontender CV: S1/S2 noted, no murmurs/rubs/gallops noted LUNGS: Lungs are clear to auscultation bilaterally, no apparent distress ABDOMEN: soft, nontender, no rebound or guarding GU:no cva tenderness NEURO: Pt is awake/alert, moves all extremitiesx4 EXTREMITIES: pulses normal, full ROM SKIN: warm, color normal PSYCH: no abnormalities of mood noted  ED Course  Procedures  Labs Reviewed  POCT I-STAT, CHEM 8 - Abnormal; Notable for the following:    Glucose, Bld 105 (*)    All other components within normal limits  POCT I-STAT TROPONIN  I   Suspect long standing COPD, advised to quit smoking and f/u with PCP Low suspicion for acute PE/CHF/pericardial effusion I doubt ACS, his pain had essentially resolved by the time of eval and his troponin was negative  Stable for d/c home  MDM  Nursing notes including past medical history and social history reviewed and considered in documentation Labs/vital reviewed and considered xrays reviewed and considered        Date: 09/30/2012  Rate: 74  Rhythm: normal sinus rhythm  QRS Axis: normal  Intervals: normal  ST/T Wave abnormalities: nonspecific ST changes  Conduction Disutrbances:none  Narrative Interpretation:   Old EKG Reviewed: unchanged    Joya Gaskins, MD 09/30/12 1648

## 2012-09-30 NOTE — ED Notes (Signed)
MD at bedside. 

## 2013-01-29 ENCOUNTER — Other Ambulatory Visit: Payer: Medicare Other

## 2013-01-29 ENCOUNTER — Encounter: Payer: Self-pay | Admitting: Emergency Medicine

## 2013-01-29 ENCOUNTER — Ambulatory Visit (INDEPENDENT_AMBULATORY_CARE_PROVIDER_SITE_OTHER): Payer: Medicare Other | Admitting: Emergency Medicine

## 2013-01-29 VITALS — BP 130/78 | HR 82 | Temp 98.1°F | Ht 67.0 in | Wt 177.8 lb

## 2013-01-29 DIAGNOSIS — J449 Chronic obstructive pulmonary disease, unspecified: Secondary | ICD-10-CM | POA: Insufficient documentation

## 2013-01-29 DIAGNOSIS — F17200 Nicotine dependence, unspecified, uncomplicated: Secondary | ICD-10-CM

## 2013-01-29 NOTE — Patient Instructions (Signed)
Please stop scheduled albuterol  Start Breo once a day  You may still use albuterol 2 puffs if needed We will perform full pulmonary function testing  We will check your alpha-one antitrypsin blood work today Follow with Dr Delton Coombes in 1 month or next available opening

## 2013-01-29 NOTE — Progress Notes (Signed)
Subjective:    Patient ID: Kyle Walters, male    DOB: 1956/02/10, 57 y.o.   MRN: [id number]  HPI 57 yo man, hx tobacco use (1.5 pk/day x 39 yrs), arthritis, anxiety/panic, migraines. Presents today to discuss dyspnea, he first started to notice Dec '13, then again in Spring 2014. Went to the ED in June after an episode of dyspnea, HA. A CXR at that time was hyperinflated. He was given albuterol that he uses qid on a schedule. No cough. He does sometimes hear wheeze, this was more prominent when he smoked more. His exertion is limited by hip pain, but he is also limited by breathing at times. He was not tried on anti-cholinergic due to prostate enlargement.    Father > mild COPD.    Review of Systems  Constitutional: Negative for fever and unexpected weight change.  HENT: Negative for congestion, dental problem, ear pain, nosebleeds, postnasal drip, rhinorrhea, sinus pressure, sneezing, sore throat and trouble swallowing.   Eyes: Negative for redness and itching.  Respiratory: Positive for shortness of breath. Negative for cough, chest tightness and wheezing.        With exercise  Cardiovascular: Positive for chest pain. Negative for palpitations and leg swelling.  Gastrointestinal: Negative for nausea and vomiting.  Genitourinary: Positive for penile pain. Negative for dysuria.  Musculoskeletal: Negative for joint swelling.  Skin: Negative for rash.  Neurological: Positive for headaches.       Migrains  Hematological: Does not bruise/bleed easily.  Psychiatric/Behavioral: Positive for dysphoric mood. The patient is nervous/anxious.     Past Medical History  Diagnosis Date  . Arthritis   . Enlarged prostate   . Anxiety panic attack  . Panic attack   . Depression   . ADHD (attention deficit hyperactivity disorder)   . Migraine      Family History  Problem Relation Age of Onset  . Breast cancer Mother   . Diabetes Mother   . Osteoarthritis Mother   . Bladder Cancer Father    . Diabetes Father   . Stroke Father   . Hypertension Father   . Pancreatic cancer Father      History   Social History  . Marital Status: Married    Spouse Name: N/A    Number of Children: N/A  . Years of Education: N/A   Occupational History  . Not on file.   Social History Main Topics  . Smoking status: Current Every Day Smoker -- 0.50 packs/day for 38 years    Types: Cigarettes  . Smokeless tobacco: Not on file     Comment: Also using E-Cig  . Alcohol Use: No  . Drug Use: No  . Sexual Activity: Not on file   Other Topics Concern  . Not on file   Social History Narrative  . No narrative on file     Allergies  Allergen Reactions  . Tetracycline Anaphylaxis  . Bactrim [Sulfamethoxazole-Trimethoprim] Nausea And Vomiting  . Effexor [Venlafaxine Hcl] Rash     Outpatient Prescriptions Prior to Visit  Medication Sig Dispense Refill  . ALPRAZolam (XANAX) 1 MG tablet Take 1 mg by mouth 5 (five) times daily.      Marland Kitchen doxazosin (CARDURA) 2 MG tablet Take 2 mg by mouth at bedtime.      Marland Kitchen escitalopram (LEXAPRO) 10 MG tablet Take 10 mg by mouth at bedtime.      . gabapentin (NEURONTIN) 400 MG capsule Take 400 mg by mouth 4 (four) times daily.      Marland Kitchen  lansoprazole (PREVACID) 15 MG capsule Take 15 mg by mouth daily.      . methylphenidate (RITALIN) 10 MG tablet Take 10 mg by mouth 2 (two) times daily.      . SUMAtriptan (IMITREX) 50 MG tablet Take 50 mg by mouth every 2 (two) hours as needed. For migraine       No facility-administered medications prior to visit.   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   CXR 09/30/12 Comparison: CT abdomen and pelvis 02/14/2010.  Findings: The heart is enlarged. Emphysematous changes are  evident. No focal airspace disease is present. There is no edema  or effusion to suggest failure. Chronic end plate changes are  noted.  IMPRESSION:  1. Emphysema.  2. Cardiomegaly without failure.  3. No acute cardiopulmonary disease      Objective:    Physical Exam Filed Vitals:   01/29/13 1535  BP: 130/78  Pulse: 82  Temp: 98.1 F (36.7 C)  TempSrc: Oral  Height: 5\' 7"  (1.702 m)  Weight: 177 lb 12.8 oz (80.65 kg)  SpO2: 95%   Gen: Pleasant, well-nourished, in no distress,  normal affect  ENT: No lesions,  mouth clear,  oropharynx clear, no postnasal drip  Neck: No JVD, no TMG, no carotid bruits  Lungs: No use of accessory muscles, no dullness to percussion, clear without rales or rhonchi  Cardiovascular: RRR, heart sounds normal, no murmur or gallops, no peripheral edema  Musculoskeletal: No deformities, no cyanosis or clubbing  Neuro: alert, non focal  Skin: Warm, no lesions or rashes     Assessment & Plan:  COPD (chronic obstructive pulmonary disease) Change scheduled albuterol to breo to see if he benefits Avoid anti-cholinergics due to urinary retention Albuterol to prn Check PFT and a1-AT rov next available

## 2013-01-29 NOTE — Assessment & Plan Note (Signed)
Change scheduled albuterol to breo to see if he benefits Avoid anti-cholinergics due to urinary retention Albuterol to prn Check PFT and a1-AT rov next available

## 2013-02-04 LAB — ALPHA-1 ANTITRYPSIN PHENOTYPE: A-1 Antitrypsin: 155 mg/dL (ref 83–199)

## 2013-02-23 ENCOUNTER — Telehealth: Payer: Self-pay | Admitting: Emergency Medicine

## 2013-02-23 NOTE — Telephone Encounter (Signed)
I think he should stay off the breo, set up OV to discuss whether he still has sx and to consider an alternative to [facility]

## 2013-02-23 NOTE — Telephone Encounter (Signed)
I spoke with pt. He reports he has been experiencing fatigue and weakness x 1 week but worse since Friday. He reports he started the breo 3 weeks ago. The breathing has been better since being on this medication. He has not used the breo in the past 2 days and noticed the fatigue and muscle weakness was not as bad. Please advise RB thanks  Allergies  Allergen Reactions  . Tetracycline Anaphylaxis  . Bactrim [Sulfamethoxazole-Trimethoprim] Nausea And Vomiting  . Effexor [Venlafaxine Hcl] Rash

## 2013-02-23 NOTE — Telephone Encounter (Signed)
I spoke with pt and appt scheduled for 02/25/13 with RB. Nothing further needed

## 2013-02-25 ENCOUNTER — Ambulatory Visit (INDEPENDENT_AMBULATORY_CARE_PROVIDER_SITE_OTHER): Payer: Medicare Other | Admitting: Emergency Medicine

## 2013-02-25 ENCOUNTER — Encounter: Payer: Self-pay | Admitting: Emergency Medicine

## 2013-02-25 VITALS — BP 120/78 | HR 70 | Ht 66.5 in | Wt 176.6 lb

## 2013-02-25 DIAGNOSIS — J449 Chronic obstructive pulmonary disease, unspecified: Secondary | ICD-10-CM

## 2013-02-25 NOTE — Patient Instructions (Signed)
Please stop Virgel Bouquet We will try starting symbicort 2 puffs twice a day. Start this medication when your fatigue, weakness, nausea, cramping seems to be back to baseline. We will use it for 3-4 weeks to see if effective. Rinse your mouth after using.  Have albuterol available to use as needed.  Get your breathing tests as planned.  Follow with Dr Delton Coombes in 2 months or sooner if you have any problems.

## 2013-02-25 NOTE — Progress Notes (Signed)
Subjective:    Patient ID: Kyle Walters, male    DOB: 11/06/1955, 57 y.o.   MRN: [id number]  HPI 57 yo man, hx tobacco use (1.5 pk/day x 39 yrs), arthritis, anxiety/panic, migraines. Presents today to discuss dyspnea, he first started to notice Dec '13, then again in Spring 2014. Went to the ED in June after an episode of dyspnea, HA. A CXR at that time was hyperinflated. He was given albuterol that he uses qid on a schedule. No cough. He does sometimes hear wheeze, this was more prominent when he smoked more. His exertion is limited by hip pain, but he is also limited by breathing at times. He was not tried on anti-cholinergic due to prostate enlargement.    ROV 02/25/13 -- 57 yo man with COPD. Last time we started breo to see if he would benefit (urinary retention so avoided spiriva). a1-AT is MM. He did not have PFT > . Returns stating that he is having fatigue, weakness, has some muscle soreness and cramping, ascribes it to the [place]. The sx came on slowly, seemed to be leaving slowly. It did seem to help his breathing.      Review of Systems  Constitutional: Negative for fever and unexpected weight change.  HENT: Negative for congestion, dental problem, ear pain, nosebleeds, postnasal drip, rhinorrhea, sinus pressure, sneezing, sore throat and trouble swallowing.   Eyes: Negative for redness and itching.  Respiratory: Positive for shortness of breath. Negative for cough, chest tightness and wheezing.        With exercise  Cardiovascular: Positive for chest pain. Negative for palpitations and leg swelling.  Gastrointestinal: Negative for nausea and vomiting.  Genitourinary: Positive for penile pain. Negative for dysuria.  Musculoskeletal: Negative for joint swelling.  Skin: Negative for rash.  Neurological: Positive for headaches.       Migrains  Hematological: Does not bruise/bleed easily.  Psychiatric/Behavioral: Positive for dysphoric mood. The patient is nervous/anxious.      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   CXR 09/30/12 Comparison: CT abdomen and pelvis 02/14/2010.  Findings: The heart is enlarged. Emphysematous changes are  evident. No focal airspace disease is present. There is no edema  or effusion to suggest failure. Chronic end plate changes are  noted.  IMPRESSION:  1. Emphysema.  2. Cardiomegaly without failure.  3. No acute cardiopulmonary disease      Objective:   Physical Exam Filed Vitals:   02/25/13 1211  BP: 120/78  Pulse: 70  Height: 5' 6.5" (1.689 m)  Weight: 176 lb 9.6 oz (80.105 kg)  SpO2: 93%   Gen: Pleasant, well-nourished, in no distress,  normal affect  ENT: No lesions,  mouth clear,  oropharynx clear, no postnasal drip  Neck: No JVD, no TMG, no carotid bruits  Lungs: No use of accessory muscles, no dullness to percussion, clear without rales or rhonchi  Cardiovascular: RRR, heart sounds normal, no murmur or gallops, no peripheral edema  Musculoskeletal: No deformities, no cyanosis or clubbing  Neuro: alert, non focal  Skin: Warm, no lesions or rashes     Assessment & Plan:  COPD (chronic obstructive pulmonary disease) Patient benefited from Brio but developed possible side effects. Will stop this and try Symbicort twice a day. He will start the medication when his fatigue nausea cramping subsides. Like him to use it for at least a month to see if its beneficial. In the meantime he will get his pulmonary function testing. Follow up in 2 months

## 2013-02-25 NOTE — Assessment & Plan Note (Signed)
Patient benefited from [facility] but developed possible side effects. Will stop this and try Symbicort twice a day. He will start the medication when his fatigue nausea cramping subsides. Like him to use it for at least a month to see if its beneficial. In the meantime he will get his pulmonary function testing. Follow up in 2 months

## 2013-03-09 ENCOUNTER — Ambulatory Visit (INDEPENDENT_AMBULATORY_CARE_PROVIDER_SITE_OTHER): Payer: Medicare Other | Admitting: Emergency Medicine

## 2013-03-09 ENCOUNTER — Encounter (INDEPENDENT_AMBULATORY_CARE_PROVIDER_SITE_OTHER): Payer: Self-pay

## 2013-03-09 ENCOUNTER — Ambulatory Visit: Payer: Medicare Other | Admitting: Emergency Medicine

## 2013-03-09 DIAGNOSIS — J4489 Other specified chronic obstructive pulmonary disease: Secondary | ICD-10-CM

## 2013-03-09 DIAGNOSIS — J449 Chronic obstructive pulmonary disease, unspecified: Secondary | ICD-10-CM

## 2013-03-09 NOTE — Progress Notes (Signed)
PFT done today. 

## 2013-03-25 ENCOUNTER — Telehealth: Payer: Self-pay | Admitting: Emergency Medicine

## 2013-03-25 NOTE — Telephone Encounter (Signed)
lmtcb x1 with spouse

## 2013-03-25 NOTE — Telephone Encounter (Signed)
Last visit 02-25-13. I spoke with the pt and he states when he tried Breo in the past he developed a lot of muscle weakness in his legs and arms as well as fatigue. He states he was switched to symbicort at last OV and the same thing is happening. He states he is having muscle weakness in legs and arms. He states it is not as bad as it was on the [place], but it is still present. Pt asking for recs. Since RB is on nights I will send this to the doc-of-the-day. Please advise. Carron Curie, CMA Allergies  Allergen Reactions  . Tetracycline Anaphylaxis  . Bactrim [Sulfamethoxazole-Trimethoprim] Nausea And Vomiting  . Effexor [Venlafaxine Hcl] Rash

## 2013-03-25 NOTE — Telephone Encounter (Signed)
Can try dulera 100/5, 2 puffs bid. Can come by for one sample to see if he can tolerate.  Please send this to Dr. Delton Coombes as well

## 2013-03-26 NOTE — Telephone Encounter (Signed)
Pt is aware of the change to his inhaler. Will leave samples up front for pick up.

## 2013-03-26 NOTE — Telephone Encounter (Signed)
LMTCBx2. Amun Stemm, CMA  

## 2013-04-14 ENCOUNTER — Telehealth: Payer: Self-pay | Admitting: Emergency Medicine

## 2013-04-14 NOTE — Telephone Encounter (Signed)
Attempted to call x1 LMTCB 

## 2013-04-14 NOTE — Telephone Encounter (Signed)
Returning call can be reached at [phone].Kyle Walters

## 2013-04-14 NOTE — Telephone Encounter (Signed)
I spoke with pt. He reports the dulera has caused him to have some muscle weakness. He took this x 1 week then stopped. He has been off this x 1 week. He has his albuterol inhaler to use PRN. Also breo and symbicort caused same symptom as well. He has pending appt with RB in Jan. He wants to wait until RB addresses this to see if any other options are available or if just using his rescue inhaler PRN is okay. Please advise thanks

## 2013-04-20 NOTE — Telephone Encounter (Signed)
Agree with stopping symbicort. He can use his albuterol q4h prn for now. Future alternatives would be an ICS or Spiriva. I didn't try Spiriva first because he has had urinary retention in the past. We could also consider scheduled albuterol/ipratropium nebs or combivent. Make sure he has an OV

## 2013-04-21 NOTE — Telephone Encounter (Signed)
[email or web address].Sarrinah Gardin, CMA  

## 2013-04-21 NOTE — Telephone Encounter (Signed)
Advised pt of RB's recs.  Pt request to wait on trying spiriva or any other inhaler until his ov with RB 05/13/13.  Advised pt to contact office if symptoms worsen. Nothing further is needed, will discuss options with RB at next OV

## 2013-05-13 ENCOUNTER — Encounter (INDEPENDENT_AMBULATORY_CARE_PROVIDER_SITE_OTHER): Payer: Self-pay

## 2013-05-13 ENCOUNTER — Encounter: Payer: Self-pay | Admitting: Emergency Medicine

## 2013-05-13 ENCOUNTER — Ambulatory Visit (INDEPENDENT_AMBULATORY_CARE_PROVIDER_SITE_OTHER): Payer: Medicare Other | Admitting: Emergency Medicine

## 2013-05-13 VITALS — BP 122/80 | HR 78 | Ht 66.0 in | Wt 178.8 lb

## 2013-05-13 DIAGNOSIS — J449 Chronic obstructive pulmonary disease, unspecified: Secondary | ICD-10-CM

## 2013-05-13 NOTE — Patient Instructions (Signed)
Please try restarting breo 1 inhalation daily. Rinse your mouth after using.  Use your albuterol 2 puffs only as needed for shortness of breath Follow with Dr Lamonte Sakai in 2 months or sooner if you have any problems.

## 2013-05-13 NOTE — Assessment & Plan Note (Signed)
Discussed his PFT with him today - severe AFL + BD response, hyperinflation, DLCO that corrects.  - will try to restart Breo - albuterol to prn - rov 2 months

## 2013-05-13 NOTE — Progress Notes (Signed)
Subjective:    Patient ID: Kyle Walters, male    DOB: [birth year], 58 y.o.   MRN: [id number]  HPI 58 yo man, hx tobacco use (1.5 pk/day x 39 yrs), arthritis, anxiety/panic, migraines. Presents today to discuss dyspnea, he first started to notice Dec '13, then again in Spring 2014. Went to the ED in June after an episode of dyspnea, HA. A CXR at that time was hyperinflated. He was given albuterol that he uses qid on a schedule. No cough. He does sometimes hear wheeze, this was more prominent when he smoked more. His exertion is limited by hip pain, but he is also limited by breathing at times. He was not tried on anti-cholinergic due to prostate enlargement.    ROV 02/25/13 -- 58 yo man with COPD. Last time we started breo to see if he would benefit (urinary retention so avoided spiriva). a1-AT is MM. He did not have PFT > . Returns stating that he is having fatigue, weakness, has some muscle soreness and cramping, ascribes it to the [place]. The sx came on slowly, seemed to be leaving slowly. It did seem to help his breathing.   ROV 05/13/13 -- follows for COPD. We stopped Breo after he had muscle cramping, ? Side effect. Also avoided Spiriva due to urinary retention. Started Symbicort last visit, stopped it due to same sx. In retrospect  He believes that diet and decreased protein intake contributed to his muscle complaint > better on Whey protein. He has mae efforts to stop smoking > just stopped on 05/10/13. He is on 21mg  nicotine, nicotine gum. He is interested in trying [place] again.    Review of Systems  Constitutional: Negative for fever and unexpected weight change.  HENT: Negative for congestion, dental problem, ear pain, nosebleeds, postnasal drip, rhinorrhea, sinus pressure, sneezing, sore throat and trouble swallowing.   Eyes: Negative for redness and itching.  Respiratory: Positive for shortness of breath. Negative for cough, chest tightness and wheezing.        With exercise   Cardiovascular: Positive for chest pain. Negative for palpitations and leg swelling.  Gastrointestinal: Negative for nausea and vomiting.  Genitourinary: Positive for penile pain. Negative for dysuria.  Musculoskeletal: Negative for joint swelling.  Skin: Negative for rash.  Neurological: Positive for headaches.       Migrains  Hematological: Does not bruise/bleed easily.  Psychiatric/Behavioral: Positive for dysphoric mood. The patient is nervous/anxious.     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   CXR 09/30/12 Comparison: CT abdomen and pelvis 02/14/2010.  Findings: The heart is enlarged. Emphysematous changes are  evident. No focal airspace disease is present. There is no edema  or effusion to suggest failure. Chronic end plate changes are  noted.  IMPRESSION:  1. Emphysema.  2. Cardiomegaly without failure.  3. No acute cardiopulmonary disease      Objective:   Physical Exam Filed Vitals:   05/13/13 1427  BP: 122/80  Pulse: 78  Height: 5\' 6"  (1.676 m)  Weight: 178 lb 12.8 oz (81.103 kg)  SpO2: 94%   Gen: Pleasant, well-nourished, in no distress,  normal affect  ENT: No lesions,  mouth clear,  oropharynx clear, no postnasal drip  Neck: No JVD, no TMG, no carotid bruits  Lungs: No use of accessory muscles, no dullness to percussion, clear without rales or rhonchi  Cardiovascular: RRR, heart sounds normal, no murmur or gallops, no peripheral edema  Musculoskeletal: No deformities, no cyanosis or clubbing  Neuro: alert, non focal  Skin: Warm, no lesions or rashes     Assessment & Plan:  COPD (chronic obstructive pulmonary disease) Discussed his PFT with him today - severe AFL + BD response, hyperinflation, DLCO that corrects.  - will try to restart Breo - albuterol to prn - rov 2 months

## 2013-05-14 LAB — PULMONARY FUNCTION TEST
DL/VA % pred: 87 %
DL/VA: 3.81 ml/min/mmHg/L
DLCO unc % pred: 76 %
DLCO unc: 20.68 ml/min/mmHg
FEF 25-75 Post: 1.26 L/sec
FEF 25-75 Pre: 0.56 L/sec
FEF2575-%Change-Post: 124 %
FEF2575-%Pred-Post: 46 %
FEF2575-%Pred-Pre: 20 %
FEV1-%Change-Post: 32 %
FEV1-%Pred-Post: 59 %
FEV1-%Pred-Pre: 44 %
FEV1-Post: 1.88 L
FEV1-Pre: 1.43 L
FEV1FVC-%Change-Post: 11 %
FEV1FVC-%Pred-Pre: 69 %
FEV6-%Change-Post: 18 %
FEV6-%Pred-Post: 76 %
FEV6-%Pred-Pre: 64 %
FEV6-Post: 3.02 L
FEV6-Pre: 2.56 L
FEV6FVC-%Change-Post: 0 %
FEV6FVC-%Pred-Post: 100 %
FEV6FVC-%Pred-Pre: 100 %
FVC-%Change-Post: 18 %
FVC-%Pred-Post: 76 %
FVC-%Pred-Pre: 64 %
FVC-Post: 3.18 L
Post FEV1/FVC ratio: 59 %
Post FEV6/FVC ratio: 96 %
Pre FEV1/FVC ratio: 53 %
Pre FEV6/FVC Ratio: 95 %
RV % pred: 167 %
RV: 3.33 L
TLC % pred: 98 %
TLC: 6.08 L

## 2013-07-08 ENCOUNTER — Ambulatory Visit (INDEPENDENT_AMBULATORY_CARE_PROVIDER_SITE_OTHER): Payer: Medicare Other | Admitting: Emergency Medicine

## 2013-07-08 ENCOUNTER — Encounter: Payer: Self-pay | Admitting: Emergency Medicine

## 2013-07-08 VITALS — BP 140/90 | HR 84 | Ht 67.0 in | Wt 174.0 lb

## 2013-07-08 DIAGNOSIS — J449 Chronic obstructive pulmonary disease, unspecified: Secondary | ICD-10-CM

## 2013-07-08 MED ORDER — FLUTICASONE FUROATE-VILANTEROL 100-25 MCG/INH IN AEPB: 1.0000 | INHALATION_SPRAY | Freq: Every day | RESPIRATORY_TRACT | Status: DC

## 2013-07-08 NOTE — Assessment & Plan Note (Signed)
-   walking oximetry today - we will continue Breo qd - rov6

## 2013-07-08 NOTE — Progress Notes (Signed)
Subjective:    Patient ID: Kyle Walters, male    DOB: [birth year], 58 y.o.   MRN: [id number]  HPI 58 yo man, hx tobacco use (1.5 pk/day x 39 yrs), arthritis, anxiety/panic, migraines. Presents today to discuss dyspnea, he first started to notice Dec '13, then again in Spring 2014. Went to the ED in June after an episode of dyspnea, HA. A CXR at that time was hyperinflated. He was given albuterol that he uses qid on a schedule. No cough. He does sometimes hear wheeze, this was more prominent when he smoked more. His exertion is limited by hip pain, but he is also limited by breathing at times. He was not tried on anti-cholinergic due to prostate enlargement.    ROV 02/25/13 -- 58 yo man with COPD. Last time we started breo to see if he would benefit (urinary retention so avoided spiriva). a1-AT is MM. He did not have PFT > . Returns stating that he is having fatigue, weakness, has some muscle soreness and cramping, ascribes it to the [place]. The sx came on slowly, seemed to be leaving slowly. It did seem to help his breathing.   ROV 05/13/13 -- follows for COPD. We stopped Breo after he had muscle cramping, ? Side effect. Also avoided Spiriva due to urinary retention. Started Symbicort last visit, stopped it due to same sx. In retrospect  He believes that diet and decreased protein intake contributed to his muscle complaint > better on Whey protein. He has mae efforts to stop smoking > just stopped on 05/10/13. He is on 21mg  nicotine, nicotine gum. He is interested in trying [place] again.   ROV 07/08/13 -- follow up for COPD. We tried again to start Breo in January, he has tolerated. Believes that he is benefiting > less CP, increasing his exercise. Not smoking. No flares, no new issues.    Review of Systems  Constitutional: Negative for fever and unexpected weight change.  HENT: Negative for congestion, dental problem, ear pain, nosebleeds, postnasal drip, rhinorrhea, sinus pressure, sneezing, sore throat  and trouble swallowing.   Eyes: Negative for redness and itching.  Respiratory: Positive for shortness of breath. Negative for cough, chest tightness and wheezing.        With exercise  Cardiovascular: Positive for chest pain. Negative for palpitations and leg swelling.  Gastrointestinal: Negative for nausea and vomiting.  Genitourinary: Positive for penile pain. Negative for dysuria.  Musculoskeletal: Negative for joint swelling.  Skin: Negative for rash.  Neurological: Positive for headaches.       Migrains  Hematological: Does not bruise/bleed easily.  Psychiatric/Behavioral: Positive for dysphoric mood. The patient is nervous/anxious.     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   CXR 09/30/12 Comparison: CT abdomen and pelvis 02/14/2010.  Findings: The heart is enlarged. Emphysematous changes are  evident. No focal airspace disease is present. There is no edema  or effusion to suggest failure. Chronic end plate changes are  noted.  IMPRESSION:  1. Emphysema.  2. Cardiomegaly without failure.  3. No acute cardiopulmonary disease      Objective:   Physical Exam Filed Vitals:   07/08/13 1442  BP: 140/90  Pulse: 84  Height: 5\' 7"  (1.702 m)  Weight: 174 lb (78.926 kg)  SpO2: 96%   Gen: Pleasant, well-nourished, in no distress,  normal affect  ENT: No lesions,  mouth clear,  oropharynx clear, no postnasal drip  Neck: No JVD, no TMG, no carotid bruits  Lungs: No use of accessory muscles,  no dullness to percussion, clear without rales or rhonchi  Cardiovascular: RRR, heart sounds normal, no murmur or gallops, no peripheral edema  Musculoskeletal: No deformities, no cyanosis or clubbing  Neuro: alert, non focal  Skin: Warm, no lesions or rashes     Assessment & Plan:  COPD (chronic obstructive pulmonary disease) - walking oximetry today - we will continue Breo qd - rov6

## 2013-07-08 NOTE — Addendum Note (Signed)
Addended by: Carlos American A on: 07/08/2013 03:37 PM   Modules accepted: Orders

## 2013-07-08 NOTE — Patient Instructions (Signed)
Walking oximetry today We will continue Breo once a day. Rinse your mouth after using.  Follow with Dr Lamonte Sakai in 6 months or sooner if you have any problems

## 2014-01-05 ENCOUNTER — Ambulatory Visit (INDEPENDENT_AMBULATORY_CARE_PROVIDER_SITE_OTHER): Payer: Medicare Other | Admitting: Emergency Medicine

## 2014-01-05 ENCOUNTER — Encounter: Payer: Self-pay | Admitting: Emergency Medicine

## 2014-01-05 VITALS — BP 108/64 | HR 68 | Ht 66.75 in | Wt 173.2 lb

## 2014-01-05 DIAGNOSIS — Z23 Encounter for immunization: Secondary | ICD-10-CM

## 2014-01-05 DIAGNOSIS — J449 Chronic obstructive pulmonary disease, unspecified: Secondary | ICD-10-CM

## 2014-01-05 NOTE — Patient Instructions (Signed)
Please continue your Breo and albuterol as you have been taking them  Flu shot today Follow with Dr Lamonte Sakai in 6 months or sooner if you have any problems

## 2014-01-05 NOTE — Assessment & Plan Note (Signed)
Doing well. Will continue his breo, albuterol prn Flu shot today rov 6

## 2014-01-05 NOTE — Progress Notes (Signed)
Subjective:    Patient ID: Kyle Walters, male    DOB: [birth year], 58 y.o.   MRN: [id number]  HPI 58 yo man, hx tobacco use (1.5 pk/day x 39 yrs), arthritis, anxiety/panic, migraines. Presents today to discuss dyspnea, he first started to notice Dec '13, then again in Spring 2014. Went to the ED in June after an episode of dyspnea, HA. A CXR at that time was hyperinflated. He was given albuterol that he uses qid on a schedule. No cough. He does sometimes hear wheeze, this was more prominent when he smoked more. His exertion is limited by hip pain, but he is also limited by breathing at times. He was not tried on anti-cholinergic due to prostate enlargement.    ROV 02/25/13 -- 58 yo man with COPD. Last time we started breo to see if he would benefit (urinary retention so avoided spiriva). a1-AT is MM. He did not have PFT > . Returns stating that he is having fatigue, weakness, has some muscle soreness and cramping, ascribes it to the [place]. The sx came on slowly, seemed to be leaving slowly. It did seem to help his breathing.   ROV 05/13/13 -- follows for COPD. We stopped Breo after he had muscle cramping, ? Side effect. Also avoided Spiriva due to urinary retention. Started Symbicort last visit, stopped it due to same sx. In retrospect  He believes that diet and decreased protein intake contributed to his muscle complaint > better on Whey protein. He has mae efforts to stop smoking > just stopped on 05/10/13. He is on 21mg  nicotine, nicotine gum. He is interested in trying [place] again.   ROV 07/08/13 -- follow up for COPD. We tried again to start Breo in January, he has tolerated. Believes that he is benefiting > less CP, increasing his exercise. Not smoking. No flares, no new issues.   ROV 01/05/14 -- follows for hx COPD. FEV1 1.43L (44%) with a BD response. His breathing has been stable, but a lot of other things going on > lost his job, death in the family. Has not been limited by his breathing. Rare  albuterol use. He continues to use nicotine patches from time to time.    Review of Systems  Constitutional: Negative for fever and unexpected weight change.  HENT: Negative for congestion, dental problem, ear pain, nosebleeds, postnasal drip, rhinorrhea, sinus pressure, sneezing, sore throat and trouble swallowing.   Eyes: Negative for redness and itching.  Respiratory: Positive for shortness of breath. Negative for cough, chest tightness and wheezing.        With exercise  Cardiovascular: Positive for chest pain. Negative for palpitations and leg swelling.  Gastrointestinal: Negative for nausea and vomiting.  Genitourinary: Positive for penile pain. Negative for dysuria.  Musculoskeletal: Negative for joint swelling.  Skin: Negative for rash.  Neurological: Positive for headaches.       Migrains  Hematological: Does not bruise/bleed easily.  Psychiatric/Behavioral: Positive for dysphoric mood. The patient is nervous/anxious.     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   CXR 09/30/12 Comparison: CT abdomen and pelvis 02/14/2010.  Findings: The heart is enlarged. Emphysematous changes are  evident. No focal airspace disease is present. There is no edema  or effusion to suggest failure. Chronic end plate changes are  noted.  IMPRESSION:  1. Emphysema.  2. Cardiomegaly without failure.  3. No acute cardiopulmonary disease      Objective:   Physical Exam Filed Vitals:   01/05/14 1458  BP: 108/64  Pulse: 68  Height: 5' 6.75" (1.695 m)  Weight: 173 lb 3.2 oz (78.563 kg)  SpO2: 96%   Gen: Pleasant, well-nourished, in no distress,  normal affect  ENT: No lesions,  mouth clear,  oropharynx clear, no postnasal drip  Neck: No JVD, no TMG, no carotid bruits  Lungs: No use of accessory muscles, no dullness to percussion, clear without rales or rhonchi  Cardiovascular: RRR, heart sounds normal, no murmur or gallops, no peripheral edema  Musculoskeletal: No deformities, no cyanosis or  clubbing  Neuro: alert, non focal  Skin: Warm, no lesions or rashes     Assessment & Plan:  COPD (chronic obstructive pulmonary disease) Doing well. Will continue his breo, albuterol prn Flu shot today rov 6

## 2014-03-02 ENCOUNTER — Telehealth: Payer: Self-pay | Admitting: Emergency Medicine

## 2014-03-02 MED ORDER — FLUTICASONE FUROATE-VILANTEROL 100-25 MCG/INH IN AEPB: 1.0000 | INHALATION_SPRAY | Freq: Every day | RESPIRATORY_TRACT | Status: DC

## 2014-03-02 NOTE — Telephone Encounter (Signed)
Called spouse. Aware RX sent in. Nothing further needed

## 2014-08-20 ENCOUNTER — Telehealth: Payer: Self-pay | Admitting: Emergency Medicine

## 2014-08-20 NOTE — Telephone Encounter (Signed)
Called and spoke to pt. Pt stated he has been prescribed a calcium channel blocker for migranes. The dose was recently increased and pt has been having a slight increase in SOB, which pt thinks it is from the medication. Advised pt to contact the provider that prescribed the medication as it may be from the adjusted dose. Pt verbalized understanding and denied any further questions or concerns at this time.

## 2014-08-23 ENCOUNTER — Other Ambulatory Visit: Payer: Self-pay | Admitting: Emergency Medicine

## 2014-09-14 ENCOUNTER — Other Ambulatory Visit (INDEPENDENT_AMBULATORY_CARE_PROVIDER_SITE_OTHER): Payer: Medicare Other

## 2014-09-14 ENCOUNTER — Encounter: Payer: Self-pay | Admitting: Adult Health

## 2014-09-14 ENCOUNTER — Ambulatory Visit (INDEPENDENT_AMBULATORY_CARE_PROVIDER_SITE_OTHER): Payer: Medicare Other | Admitting: Adult Health

## 2014-09-14 ENCOUNTER — Ambulatory Visit (INDEPENDENT_AMBULATORY_CARE_PROVIDER_SITE_OTHER)
Admission: RE | Admit: 2014-09-14 | Discharge: 2014-09-14 | Disposition: A | Discharge status: Home / Self Care | Payer: Medicare Other | Source: Ambulatory Visit | Attending: Adult Health | Admitting: Adult Health

## 2014-09-14 VITALS — BP 118/66 | HR 81 | Temp 98.1°F | Ht 66.0 in | Wt 184.0 lb

## 2014-09-14 DIAGNOSIS — R0600 Dyspnea, unspecified: Secondary | ICD-10-CM

## 2014-09-14 DIAGNOSIS — J449 Chronic obstructive pulmonary disease, unspecified: Secondary | ICD-10-CM

## 2014-09-14 LAB — CBC WITH DIFFERENTIAL/PLATELET
Basophils Absolute: 0 10*3/uL (ref 0.0–0.1)
Basophils Relative: 0.4 % (ref 0.0–3.0)
Eosinophils Absolute: 0.3 10*3/uL (ref 0.0–0.7)
Eosinophils Relative: 3.7 % (ref 0.0–5.0)
HCT: 42 % (ref 39.0–52.0)
Hemoglobin: 14.4 g/dL (ref 13.0–17.0)
Lymphocytes Relative: 36.1 % (ref 12.0–46.0)
Lymphs Abs: 3 10*3/uL (ref 0.7–4.0)
MCHC: 34.2 g/dL (ref 30.0–36.0)
MCV: 90.2 fl (ref 78.0–100.0)
Monocytes Absolute: 1 10*3/uL (ref 0.1–1.0)
Monocytes Relative: 11.9 % (ref 3.0–12.0)
Neutro Abs: 4 10*3/uL (ref 1.4–7.7)
Neutrophils Relative %: 47.9 % (ref 43.0–77.0)
Platelets: 267 10*3/uL (ref 150.0–400.0)
RBC: 4.66 Mil/uL (ref 4.22–5.81)
RDW: 13.7 % (ref 11.5–15.5)
WBC: 8.3 10*3/uL (ref 4.0–10.5)

## 2014-09-14 NOTE — Progress Notes (Signed)
Subjective:    Patient ID: Kyle Walters, male    DOB: 17-Aug-1955, 59 y.o.   MRN: [id number]  HPI 59 yo man, hx tobacco use (1.5 pk/day x 39 yrs), arthritis, anxiety/panic, migraines. Presents today to discuss dyspnea, he first started to notice Dec '13, then again in Spring 2014. Went to the ED in June after an episode of dyspnea, HA. A CXR at that time was hyperinflated. He was given albuterol that he uses qid on a schedule. No cough. He does sometimes hear wheeze, this was more prominent when he smoked more. His exertion is limited by hip pain, but he is also limited by breathing at times. He was not tried on anti-cholinergic due to prostate enlargement.    ROV 02/25/13 -- 59 yo man with COPD. Last time we started breo to see if he would benefit (urinary retention so avoided spiriva). a1-AT is MM. He did not have PFT > . Returns stating that he is having fatigue, weakness, has some muscle soreness and cramping, ascribes it to the [place]. The sx came on slowly, seemed to be leaving slowly. It did seem to help his breathing.   ROV 05/13/13 -- follows for COPD. We stopped Breo after he had muscle cramping, ? Side effect. Also avoided Spiriva due to urinary retention. Started Symbicort last visit, stopped it due to same sx. In retrospect  He believes that diet and decreased protein intake contributed to his muscle complaint > better on Whey protein. He has mae efforts to stop smoking > just stopped on 05/10/13. He is on 21mg  nicotine, nicotine gum. He is interested in trying [place] again.   ROV 07/08/13 -- follow up for COPD. We tried again to start Breo in January, he has tolerated. Believes that he is benefiting > less CP, increasing his exercise. Not smoking. No flares, no new issues.   ROV 01/05/14 -- follows for hx COPD. FEV1 1.43L (44%) with a BD response. His breathing has been stable, but a lot of other things going on > lost his job, death in the family. Has not been limited by his breathing. Rare  albuterol use. He continues to use nicotine patches from time to time.   09/14/2014 Acute OV : COPD  Complains over last 2-3 months more dyspnea on/off .  Sometimes worse when he tries to concentrate on things , at rest and bedtime .  Comes and goes.  Rides stationary bike without trouble with no dyspnea.  No desats with walk in office  No fever, chest pain, edema, calf pain, hemoptysis , wt loss. .  No cough or wheezing .  Still smokes on/off. Advised on cessation.  Does complains of migraines and anxiety -worse than usual.  Has reflux but controlled on PPI.  Remains on Stiolto.       Review of Systems  Constitutional: Negative for fever and unexpected weight change.  HENT: Negative for congestion, dental problem, ear pain, nosebleeds, postnasal drip, rhinorrhea, sinus pressure, sneezing, sore throat and trouble swallowing.   Eyes: Negative for redness and itching.  Respiratory: Positive for shortness of breath. Negative for cough, chest tightness and wheezing.    Cardiovascular: . Negative for palpitations and leg swelling.  Gastrointestinal: Negative for nausea and vomiting.  Genitourinary. Negative for dysuria.  Musculoskeletal: Negative for joint swelling.  Skin: Negative for rash.  Neurological: Positive for headaches.       Migrains  Hematological: Does not bruise/bleed easily.  Psychiatric/Behavioral: Positive for dysphoric mood. The patient is nervous/anxious.  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   CXR 09/30/12 Comparison: CT abdomen and pelvis 02/14/2010.  Findings: The heart is enlarged. Emphysematous changes are  evident. No focal airspace disease is present. There is no edema  or effusion to suggest failure. Chronic end plate changes are  noted.  IMPRESSION:  1. Emphysema.  2. Cardiomegaly without failure.  3. No acute cardiopulmonary disease      Objective:   Physical Exam  Gen: Pleasant, well-nourished, in no distress,  normal affect  ENT: No lesions,   mouth clear,  oropharynx clear, no postnasal drip  Neck: No JVD, no TMG, no carotid bruits  Lungs: No use of accessory muscles, no dullness to percussion, clear without rales or rhonchi  Cardiovascular: RRR, heart sounds normal, no murmur or gallops, no peripheral edema  Musculoskeletal: No deformities, no cyanosis or clubbing  Neuro: alert, non focal  Skin: Warm, no lesions or rashes     Assessment & Plan:

## 2014-09-14 NOTE — Assessment & Plan Note (Signed)
COPD ? Progression  Check cxr , labs with cbc and bnp.   Plan  Continue on Stiolto daily  Chest xray today .  Labs today  Follow up with Dr. Lamonte Sakai  In 3 weeks with PFT  Please contact office for sooner follow up if symptoms do not improve or worsen or seek emergency care

## 2014-09-14 NOTE — Patient Instructions (Addendum)
Continue on Stiolto daily  Chest xray today .  Labs today  Follow up with Dr. Lamonte Sakai  In 3 weeks with PFT  Please contact office for sooner follow up if symptoms do not improve or worsen or seek emergency care

## 2014-09-15 LAB — BRAIN NATRIURETIC PEPTIDE: Pro B Natriuretic peptide (BNP): 46 pg/mL (ref 0.0–100.0)

## 2014-09-15 LAB — BASIC METABOLIC PANEL
BUN: 11 mg/dL (ref 6–23)
CO2: 29 mEq/L (ref 19–32)
Calcium: 9.4 mg/dL (ref 8.4–10.5)
Chloride: 103 mEq/L (ref 96–112)
Creatinine, Ser: 0.87 mg/dL (ref 0.40–1.50)
GFR: 95.19 mL/min (ref >60.00)
Glucose, Bld: 91 mg/dL (ref 70–99)
Potassium: 4.6 mEq/L (ref 3.5–5.1)
Sodium: 138 mEq/L (ref 135–145)

## 2014-09-15 NOTE — Progress Notes (Signed)
Quick Note:  Spoke with pt and notified of results per Tammy Parrett. Pt verbalized understanding and denied any questions.  ______ 

## 2014-09-17 ENCOUNTER — Telehealth: Payer: Self-pay | Admitting: Emergency Medicine

## 2014-09-17 DIAGNOSIS — J439 Emphysema, unspecified: Secondary | ICD-10-CM

## 2014-09-17 NOTE — Progress Notes (Signed)
Quick Note:  LM w/ male family member to have pt call back. ______

## 2014-09-17 NOTE — Telephone Encounter (Signed)
Order placed for PFT for [facility] and called for 10/08/14 @ 2pm. Nothing further needed.

## 2014-09-24 NOTE — Progress Notes (Signed)
Quick Note:  Called and spoke to pt. Informed him of the results and recs per TP. OV with RB and PFT on 6/11. Pt verbalized understanding and denied any further questions or concerns at this time.   ______

## 2014-09-24 NOTE — Progress Notes (Signed)
Quick Note:  Correction: OV and PFT on 6/17. ______

## 2014-09-28 ENCOUNTER — Other Ambulatory Visit: Payer: Self-pay | Admitting: Internal Medicine

## 2014-09-29 ENCOUNTER — Telehealth: Payer: Self-pay | Admitting: Emergency Medicine

## 2014-09-29 NOTE — Telephone Encounter (Signed)
Spoke with pt's wife and advised that refill for [facility] was sent to pharmacy.

## 2014-10-08 ENCOUNTER — Ambulatory Visit: Payer: Medicare Other | Admitting: Emergency Medicine

## 2014-10-08 ENCOUNTER — Encounter (HOSPITAL_COMMUNITY): Payer: Medicare Other

## 2014-10-11 ENCOUNTER — Encounter (HOSPITAL_COMMUNITY): Payer: Medicare Other

## 2014-10-14 ENCOUNTER — Encounter (HOSPITAL_COMMUNITY): Payer: Medicare Other

## 2014-11-10 ENCOUNTER — Ambulatory Visit (INDEPENDENT_AMBULATORY_CARE_PROVIDER_SITE_OTHER): Payer: Medicare Other | Admitting: Emergency Medicine

## 2014-11-10 DIAGNOSIS — J439 Emphysema, unspecified: Secondary | ICD-10-CM

## 2014-11-10 LAB — PULMONARY FUNCTION TEST
DL/VA % pred: 87 %
DL/VA: 3.79 ml/min/mmHg/L
DLCO unc % pred: 78 %
DLCO unc: 21.25 ml/min/mmHg
FEF 25-75 Post: 0.91 L/sec
FEF 25-75 Pre: 1.17 L/sec
FEF2575-%Change-Post: -22 %
FEF2575-%Pred-Post: 34 %
FEF2575-%Pred-Pre: 44 %
FEV1-%Change-Post: -5 %
FEV1-%Pred-Post: 65 %
FEV1-%Pred-Pre: 69 %
FEV1-Post: 2.04 L
FEV1-Pre: 2.15 L
FEV1FVC-%Change-Post: -1 %
FEV1FVC-%Pred-Pre: 86 %
FEV6-%Change-Post: -3 %
FEV6-%Pred-Post: 78 %
FEV6-%Pred-Pre: 81 %
FEV6-Post: 3.07 L
FEV6-Pre: 3.18 L
FEV6FVC-%Change-Post: 0 %
FEV6FVC-%Pred-Post: 102 %
FEV6FVC-%Pred-Pre: 102 %
FVC-%Change-Post: -4 %
FVC-%Pred-Post: 76 %
FVC-%Pred-Pre: 79 %
FVC-Post: 3.15 L
FVC-Pre: 3.29 L
Post FEV1/FVC ratio: 65 %
Post FEV6/FVC ratio: 98 %
Pre FEV1/FVC ratio: 65 %
Pre FEV6/FVC Ratio: 98 %

## 2014-11-10 NOTE — Progress Notes (Signed)
PFT done today. 

## 2014-11-18 ENCOUNTER — Ambulatory Visit (INDEPENDENT_AMBULATORY_CARE_PROVIDER_SITE_OTHER): Payer: Medicare Other | Admitting: Emergency Medicine

## 2014-11-18 ENCOUNTER — Encounter: Payer: Self-pay | Admitting: Emergency Medicine

## 2014-11-18 VITALS — BP 112/62 | HR 80 | Ht 66.0 in | Wt 182.8 lb

## 2014-11-18 DIAGNOSIS — J449 Chronic obstructive pulmonary disease, unspecified: Secondary | ICD-10-CM

## 2014-11-18 NOTE — Progress Notes (Signed)
Subjective:    Patient ID: Kyle Walters, male    DOB: [birth year], 59 y.o.   MRN: [id number]  HPI 20- yo man, hx tobacco use (1.5 pk/day x 39 yrs), arthritis, anxiety/panic, migraines. Presents today to discuss dyspnea, he first started to notice Dec '13, then again in Spring 2014. Went to the ED in June after an episode of dyspnea, HA. A CXR at that time was hyperinflated. He was given albuterol that he uses qid on a schedule. No cough. He does sometimes hear wheeze, this was more prominent when he smoked more. His exertion is limited by hip pain, but he is also limited by breathing at times. He was not tried on anti-cholinergic due to prostate enlargement.    ROV 02/25/13 -- 59 yo man with COPD. Last time we started breo to see if he would benefit (urinary retention so avoided spiriva). a1-AT is MM. He did not have PFT > . Returns stating that he is having fatigue, weakness, has some muscle soreness and cramping, ascribes it to the [place]. The sx came on slowly, seemed to be leaving slowly. It did seem to help his breathing.   ROV 05/13/13 -- follows for COPD. We stopped Breo after he had muscle cramping, ? Side effect. Also avoided Spiriva due to urinary retention. Started Symbicort last visit, stopped it due to same sx. In retrospect  He believes that diet and decreased protein intake contributed to his muscle complaint > better on Whey protein. He has mae efforts to stop smoking > just stopped on 05/10/13. He is on 21mg  nicotine, nicotine gum. He is interested in trying [place] again.   ROV 07/08/13 -- follow up for COPD. We tried again to start Breo in January, he has tolerated. Believes that he is benefiting > less CP, increasing his exercise. Not smoking. No flares, no new issues.   ROV 01/05/14 -- follows for hx COPD. FEV1 1.43L (44%) with a BD response. His breathing has been stable, but a lot of other things going on > lost his job, death in the family. Has not been limited by his breathing. Rare  albuterol use. He continues to use nicotine patches from time to time.   Acute OV : COPD  09/14/14 --  Complains over last 2-3 months more dyspnea on/off .  Sometimes worse when he tries to concentrate on things , at rest and bedtime .  Comes and goes.  Rides stationary bike without trouble with no dyspnea.  No desats with walk in office  No fever, chest pain, edema, calf pain, hemoptysis , wt loss. .  No cough or wheezing .  Still smokes on/off. Advised on cessation.  Does complains of migraines and anxiety -worse than usual.  Has reflux but controlled on PPI.     ROV 11/18/14 -- follow-up visit for COPD with asthmatic component and a positive bronchodilator response. He is currently managed on Breo, albuterol prn. He underwent repeat pulmonary function testing on 11/10/14 that I personally reviewed today. This shows an improvement in his FEV1 from 1.43 L back in November 2014 to 2.15 L or 69% of predicted. His total lung capacity is decreased as is his diffusion capacity although this corrects for alveolar volume.  I reviewed his chest x-ray from 09/15/14 which shows no infiltrates and a normal cardiac shadow. He asked several questions today about PFT, BP. Of note he was on diltiazem and queried whether this was effecting his functional status.     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   CXR 09/30/12 Comparison: CT abdomen and pelvis 02/14/2010.  Findings: The heart is enlarged. Emphysematous changes are  evident. No focal airspace disease is present. There is no edema  or effusion to suggest failure. Chronic end plate changes are  noted.  IMPRESSION:  1. Emphysema.  2. Cardiomegaly without failure.  3. No acute cardiopulmonary disease     Objective:   Physical Exam Filed Vitals:   11/18/14 1518  BP: 112/62  Pulse: 80  Height: 5\' 6"  (1.676 m)  Weight: 182 lb 12.8 oz (82.918 kg)  SpO2: 94%    Gen: Pleasant, well-nourished, in no distress,  normal affect  ENT: No lesions,  mouth clear,   oropharynx clear, no postnasal drip  Neck: No JVD, no TMG, no carotid bruits  Lungs: No use of accessory muscles, no dullness to percussion, clear without rales or rhonchi  Cardiovascular: RRR, heart sounds normal, no murmur or gallops, no peripheral edema  Musculoskeletal: No deformities, no cyanosis or clubbing  Neuro: awake, a bit slow to   Skin: Warm, no lesions or rashes     Assessment & Plan:  COPD (chronic obstructive pulmonary disease) Clinically stable to improved since he stopped smoking. He is tolerating Breo and appears to have benefited. We spent 80% of our 25 minute visit talking about the physiology, about his point function testing, about the interplay between his blood pressure, heart rate cardiac status and his pulmonary status.    Please continue your breo as you have been taking it.  Have albuterol available to use if needed for shortness of breath.  Follow with Dr Lamonte Sakai in 6 months or sooner if you have any problems

## 2014-11-18 NOTE — Patient Instructions (Addendum)
Please continue your breo as you have been taking it.  Have albuterol available to use if needed for shortness of breath.  Follow with Dr Lamonte Sakai in 6 months or sooner if you have any problems

## 2014-11-18 NOTE — Assessment & Plan Note (Signed)
Clinically stable to improved since he stopped smoking. He is tolerating Breo and appears to have benefited. We spent 80% of our 25 minute visit talking about the physiology, about his point function testing, about the interplay between his blood pressure, heart rate cardiac status and his pulmonary status.    Please continue your breo as you have been taking it.  Have albuterol available to use if needed for shortness of breath.  Follow with Dr Lamonte Sakai in 6 months or sooner if you have any problems

## 2014-11-25 ENCOUNTER — Encounter (HOSPITAL_COMMUNITY): Payer: Self-pay | Admitting: Emergency Medicine

## 2014-11-25 ENCOUNTER — Emergency Department (HOSPITAL_COMMUNITY)
Admission: EM | Admit: 2014-11-25 | Discharge: 2014-11-25 | Disposition: A | Discharge status: Home / Self Care | Payer: Medicare Other | Attending: Emergency Medicine | Admitting: Emergency Medicine

## 2014-11-25 ENCOUNTER — Emergency Department (HOSPITAL_COMMUNITY): Payer: Medicare Other

## 2014-11-25 DIAGNOSIS — Z79899 Other long term (current) drug therapy: Secondary | ICD-10-CM | POA: Diagnosis not present

## 2014-11-25 DIAGNOSIS — G43909 Migraine, unspecified, not intractable, without status migrainosus: Secondary | ICD-10-CM | POA: Insufficient documentation

## 2014-11-25 DIAGNOSIS — F329 Major depressive disorder, single episode, unspecified: Secondary | ICD-10-CM | POA: Diagnosis not present

## 2014-11-25 DIAGNOSIS — F410 Panic disorder [episodic paroxysmal anxiety] without agoraphobia: Secondary | ICD-10-CM | POA: Diagnosis not present

## 2014-11-25 DIAGNOSIS — R519 Headache, unspecified: Secondary | ICD-10-CM

## 2014-11-25 DIAGNOSIS — M1990 Unspecified osteoarthritis, unspecified site: Secondary | ICD-10-CM | POA: Insufficient documentation

## 2014-11-25 DIAGNOSIS — Z87891 Personal history of nicotine dependence: Secondary | ICD-10-CM | POA: Diagnosis not present

## 2014-11-25 DIAGNOSIS — R51 Headache: Secondary | ICD-10-CM

## 2014-11-25 NOTE — Discharge Instructions (Signed)

## 2014-11-25 NOTE — ED Provider Notes (Signed)
CSN: [id number]     Arrival date & time 11/25/14  1334 History   First MD Initiated Contact with Patient 11/25/14 1347     Chief Complaint  Patient presents with  . Headache     (Consider location/radiation/quality/duration/timing/severity/associated sxs/prior Treatment) Patient is a 59 y.o. male presenting with headaches.  Headache Pain location:  Generalized Quality:  Dull Radiates to:  Does not radiate Severity currently:  4/10 Severity at highest:  4/10 Onset quality:  Gradual Duration:  4 days Timing:  Constant Progression:  Unchanged Chronicity:  Recurrent Similar to prior headaches: yes   Context: bright light   Relieved by:  Nothing Worsened by:  Nothing Ineffective treatments:  None tried Associated symptoms: no abdominal pain, no diarrhea, no nausea, no numbness and no vomiting     Past Medical History  Diagnosis Date  . Arthritis   . Enlarged prostate   . Anxiety panic attack  . Panic attack   . Depression   . ADHD (attention deficit hyperactivity disorder)   . Migraine    History reviewed. No pertinent past surgical history. Family History  Problem Relation Age of Onset  . Breast cancer Mother   . Diabetes Mother   . Osteoarthritis Mother   . Bladder Cancer Father   . Diabetes Father   . Stroke Father   . Hypertension Father   . Pancreatic cancer Father    History  Substance Use Topics  . Smoking status: Former Smoker -- 1.00 packs/day for 38 years    Types: Cigarettes    Quit date: 05/17/2013  . Smokeless tobacco: Not on file     Comment: Also using E-Cig  . Alcohol Use: No    Review of Systems  Gastrointestinal: Negative for nausea, vomiting, abdominal pain and diarrhea.  Neurological: Positive for headaches. Negative for numbness.  All other systems reviewed and are negative.     Allergies  Tetracycline; Bactrim; and Effexor  Home Medications   Prior to Admission medications   Medication Sig Start Date End Date Taking?  Authorizing Provider  ALPRAZolam Duanne Moron) 1 MG tablet Take 1 mg by mouth 4 (four) times daily.    Yes Historical Provider, MD  b complex vitamins tablet Take 1 tablet by mouth daily.   Yes Historical Provider, MD  BREO ELLIPTA 100-25 MCG/INH AEPB INHALE 1 PUFF INTO THE LUNGS DAILY. 09/29/14  Yes Collene Gobble, MD  Cholecalciferol (VITAMIN D3) 2000 UNITS TABS Take 1 tablet by mouth every evening.    Yes Historical Provider, MD  co-enzyme Q-10 50 MG capsule Take 50 mg by mouth every evening.    Yes Historical Provider, MD  doxazosin (CARDURA) 4 MG tablet 1 tablet daily. 06/13/13  Yes Historical Provider, MD  escitalopram (LEXAPRO) 20 MG tablet Take 20 mg by mouth at bedtime. Take 1/2 table by mouth daily 10/26/14  Yes Historical Provider, MD  fish oil-omega-3 fatty acids 1000 MG capsule Take 2 g by mouth daily.   Yes Historical Provider, MD  gabapentin (NEURONTIN) 400 MG capsule Take 400 mg by mouth 4 (four) times daily.    Yes Historical Provider, MD  lansoprazole (PREVACID) 15 MG capsule Take 15 mg by mouth daily.   Yes Historical Provider, MD  methylphenidate (RITALIN) 10 MG tablet Take 10 mg by mouth 2 (two) times daily.   Yes Historical Provider, MD  Multiple Vitamins-Minerals (CENTRUM SILVER ADULT 50+ PO) Take by mouth.   Yes Historical Provider, MD  nicotine (NICODERM CQ - DOSED IN MG/24 HOURS) 21  mg/24hr patch Place 21 mg onto the skin daily.   Yes Historical Provider, MD  PROAIR HFA 108 (90 BASE) MCG/ACT inhaler USE 1 PUFF FOUR TIMES A DAY Patient taking differently: Use 1 Puff four times daily as needed for shortness of breath and wheezing 08/23/14  Yes Collene Gobble, MD  Riboflavin (VITAMIN B2 PO) Take 1 tablet by mouth daily.   Yes Historical Provider, MD  rizatriptan (MAXALT) 10 MG tablet Take 1 tablet by mouth as directed. May repeat every 2 hours if needed for migraines, but do not take more than 2 tablets in 24 hours or for more than 2-3 days/week 09/17/14  Yes Historical Provider, MD   Specialty Vitamins Products (MAGNESIUM, AMINO ACID CHELATE,) 133 MG tablet Take 3 tablets by mouth daily. 750 mg (3 250mg  tablets)   Yes Historical Provider, MD  zonisamide (ZONEGRAN) 25 MG capsule Take 3 capsules by mouth at bedtime. MAY INCREASE BY 1 CAP EVERY WEEK AS NEEDED UP TO 6 AT BEDTIME 10/19/14  Yes Historical Provider, MD   BP 109/61 mmHg  Pulse 62  Temp(Src) 98.1 F (36.7 C) (Oral)  Resp 14  SpO2 94% Physical Exam  Constitutional: He is oriented to person, place, and time. He appears well-developed and well-nourished.  HENT:  Head: Normocephalic and atraumatic.  Eyes: Conjunctivae and EOM are normal.  Neck: Normal range of motion. Neck supple.  Cardiovascular: Normal rate, regular rhythm and normal heart sounds.   Pulmonary/Chest: Effort normal and breath sounds normal. No respiratory distress.  Abdominal: He exhibits no distension. There is no tenderness. There is no rebound and no guarding.  Musculoskeletal: Normal range of motion.  Neurological: He is alert and oriented to person, place, and time. He has normal strength. No cranial nerve deficit or sensory deficit. GCS eye subscore is 4. GCS verbal subscore is 5. GCS motor subscore is 6.  Skin: Skin is warm and dry.  Vitals reviewed.   ED Course  Procedures (including critical care time) Labs Review Labs Reviewed - No data to display  Imaging Review Ct Head Wo Contrast  11/25/2014   CLINICAL DATA:  Dizziness and headache after walking into a wall several nights ago.  EXAM: CT HEAD WITHOUT CONTRAST  TECHNIQUE: Contiguous axial images were obtained from the base of the skull through the vertex without intravenous contrast.  COMPARISON:  Brain MRI 10/18/2011.  Head CT 09/11/2011.  FINDINGS: The ventricles and sulci are within normal limits for age. There is no evidence of acute infarct, intracranial hemorrhage, mass, midline shift, or extra-axial collection.  The orbits are unremarkable. There is mild right ethmoid air cell  mucosal thickening. The no significant mastoid effusion. No skull fracture is identified.  IMPRESSION: Unremarkable CT appearance of the brain.   Electronically Signed   By: Logan Bores   On: 11/25/2014 14:40     EKG Interpretation None      MDM   Final diagnoses:  Acute nonintractable headache, unspecified headache type    59 y.o. male with pertinent PMH of migraines, anxiety, panic attack presents with recurrent ha.  On arrival vitals and physical exam as above.  No focal neuro deficits.  Offered reglan and benadryl, pt refused, was concerned about anxiety symptoms.  He was primarily concerned as he hit his head 4 days ago.  Subsequent CT scan unremarkable.  DC home in stable condition.    I have reviewed all laboratory and imaging studies if ordered as above  1. Acute nonintractable headache, unspecified headache type  Debby Freiberg, MD 11/26/14 [phone]

## 2014-11-25 NOTE — ED Notes (Signed)
Per pt, states he got to use bathroom Sunday night and ran into wall-now having dizziness and headache

## 2015-04-07 ENCOUNTER — Other Ambulatory Visit: Payer: Self-pay | Admitting: Emergency Medicine

## 2015-05-23 ENCOUNTER — Encounter (HOSPITAL_COMMUNITY): Payer: Self-pay | Admitting: Emergency Medicine

## 2015-05-23 ENCOUNTER — Emergency Department (HOSPITAL_COMMUNITY)
Admission: EM | Admit: 2015-05-23 | Discharge: 2015-05-24 | Disposition: A | Discharge status: Home / Self Care | Payer: Medicare Other | Attending: Emergency Medicine | Admitting: Emergency Medicine

## 2015-05-23 ENCOUNTER — Emergency Department (HOSPITAL_COMMUNITY): Payer: Medicare Other

## 2015-05-23 DIAGNOSIS — J449 Chronic obstructive pulmonary disease, unspecified: Secondary | ICD-10-CM | POA: Insufficient documentation

## 2015-05-23 DIAGNOSIS — F329 Major depressive disorder, single episode, unspecified: Secondary | ICD-10-CM | POA: Insufficient documentation

## 2015-05-23 DIAGNOSIS — F419 Anxiety disorder, unspecified: Secondary | ICD-10-CM | POA: Insufficient documentation

## 2015-05-23 DIAGNOSIS — G43709 Chronic migraine without aura, not intractable, without status migrainosus: Secondary | ICD-10-CM | POA: Diagnosis not present

## 2015-05-23 DIAGNOSIS — Z720 Tobacco use: Secondary | ICD-10-CM

## 2015-05-23 DIAGNOSIS — Z79899 Other long term (current) drug therapy: Secondary | ICD-10-CM | POA: Insufficient documentation

## 2015-05-23 DIAGNOSIS — K2970 Gastritis, unspecified, without bleeding: Secondary | ICD-10-CM | POA: Diagnosis not present

## 2015-05-23 DIAGNOSIS — R079 Chest pain, unspecified: Secondary | ICD-10-CM | POA: Diagnosis present

## 2015-05-23 DIAGNOSIS — R110 Nausea: Secondary | ICD-10-CM

## 2015-05-23 DIAGNOSIS — F17200 Nicotine dependence, unspecified, uncomplicated: Secondary | ICD-10-CM | POA: Insufficient documentation

## 2015-05-23 DIAGNOSIS — F909 Attention-deficit hyperactivity disorder, unspecified type: Secondary | ICD-10-CM | POA: Insufficient documentation

## 2015-05-23 DIAGNOSIS — F410 Panic disorder [episodic paroxysmal anxiety] without agoraphobia: Secondary | ICD-10-CM

## 2015-05-23 DIAGNOSIS — R0789 Other chest pain: Secondary | ICD-10-CM | POA: Diagnosis not present

## 2015-05-23 DIAGNOSIS — Z87438 Personal history of other diseases of male genital organs: Secondary | ICD-10-CM | POA: Diagnosis not present

## 2015-05-23 DIAGNOSIS — M79622 Pain in left upper arm: Secondary | ICD-10-CM | POA: Insufficient documentation

## 2015-05-23 DIAGNOSIS — N400 Enlarged prostate without lower urinary tract symptoms: Secondary | ICD-10-CM | POA: Diagnosis not present

## 2015-05-23 DIAGNOSIS — Z8719 Personal history of other diseases of the digestive system: Secondary | ICD-10-CM

## 2015-05-23 LAB — CBC WITH DIFFERENTIAL/PLATELET
Basophils Absolute: 0.1 10*3/uL (ref 0.0–0.1)
Basophils Relative: 1 %
Eosinophils Absolute: 0.2 10*3/uL (ref 0.0–0.7)
Eosinophils Relative: 2 %
HCT: 43.2 % (ref 39.0–52.0)
Hemoglobin: 14.6 g/dL (ref 13.0–17.0)
Lymphocytes Relative: 30 %
Lymphs Abs: 3 10*3/uL (ref 0.7–4.0)
MCH: 31.6 pg (ref 26.0–34.0)
MCHC: 33.8 g/dL (ref 30.0–36.0)
MCV: 93.5 fL (ref 78.0–100.0)
Monocytes Absolute: 1.1 10*3/uL — ABNORMAL HIGH (ref 0.1–1.0)
Monocytes Relative: 11 %
Neutro Abs: 5.5 10*3/uL (ref 1.7–7.7)
Neutrophils Relative %: 56 %
Platelets: 276 10*3/uL (ref 150–400)
RBC: 4.62 MIL/uL (ref 4.22–5.81)
RDW: 13.5 % (ref 11.5–15.5)
WBC: 9.8 10*3/uL (ref 4.0–10.5)

## 2015-05-23 MED ORDER — MORPHINE SULFATE (PF) 4 MG/ML IV SOLN
4.0000 mg | Freq: Once | INTRAVENOUS | Status: AC
  Administered 2015-05-23: 4 mg via INTRAVENOUS
  Filled 2015-05-23: qty 1

## 2015-05-23 MED ORDER — ASPIRIN 81 MG PO CHEW
324.0000 mg | CHEWABLE_TABLET | Freq: Once | ORAL | Status: DC
  Filled 2015-05-23: qty 4

## 2015-05-23 MED ORDER — SODIUM CHLORIDE 0.9 % IV BOLUS (SEPSIS)
1000.0000 mL | Freq: Once | INTRAVENOUS | Status: AC
  Administered 2015-05-23: 1000 mL via INTRAVENOUS

## 2015-05-23 MED ORDER — LORAZEPAM 2 MG/ML IJ SOLN
0.5000 mg | Freq: Once | INTRAMUSCULAR | Status: AC
  Administered 2015-05-23: 0.5 mg via INTRAVENOUS
  Filled 2015-05-23: qty 1

## 2015-05-23 MED ORDER — GI COCKTAIL ~~LOC~~
30.0000 mL | Freq: Once | ORAL | Status: AC
  Administered 2015-05-23: 30 mL via ORAL
  Filled 2015-05-23: qty 30

## 2015-05-23 NOTE — ED Notes (Signed)
Patient presents for left arm pain, chest "fullness", and nausea after argument with sibling. History of anxiety. Deniea lightheadedness, dizziness, emesis, SOB.

## 2015-05-23 NOTE — ED Provider Notes (Signed)
CSN: [id number]     Arrival date & time 05/23/15  2217 History   First MD Initiated Contact with Patient 05/23/15 2305     Chief Complaint  Patient presents with  . Chest Pain  . Arm Pain  . Anxiety     (Consider location/radiation/quality/duration/timing/severity/associated sxs/prior Treatment) HPI Comments: Kyle Walters is a 60 y.o. male with a PMHx of anxiety, panic attacks, arthritis, depression, BPH, ADHD, COPD, GERD, and migraines, who presents to the ED with complaints of sudden onset left upper arm pain that began at 9 PM after having a "heated argument" with his brother on the phone. He states this feels somewhat like prior anxiety attacks. He describes the pain is 2/10 intermittent dull nonradiating pain from the left upper arm/deltoid region, worsening with lying his arm down on the bed a, and with no treatments tried prior to arrival. Associated symptoms include chest "fullness" which he states is not painful and does not seem to come from or go to his left arm. Additional symptoms include nausea.   He denies any fevers, chills, lightheadedness, diaphoresis, shortness breath, cough, wheezing, leg swelling, recent travel/surgery/immobilization, personal or family history of DVT/PE, claudication, orthopnea, abdominal pain, vomiting, diarrhea, constipation, dysuria, hematuria, numbness, tingling, or weakness. He denies any trauma to his arm. He states he is a former smoker and uses nicotine patches as well as gum, but he had dental issues last week which prevented him from being able to uses Nicorette gum therefore he has been smoking cigarettes for the last 3 days. Positive family history of MI in his maternal grandfather his 10s, maternal grandmother at age 78, and paternal grandfather at age 63. No personal hx of cardiac issues, DM, or HTN/HLD that he is aware of. Rides a stationary bike daily.   He additionally states he has a migraine at this time, which he reports is due to coming to  the hospital, states it's the same as his typical migraines without acute changes or acute onset.   Patient is a 60 y.o. male presenting with chest pain, arm pain, and anxiety. The history is provided by the patient and medical records. No language interpreter was used.  Chest Pain Associated symptoms: anxiety, headache (chronic migraine, denies acute changes) and nausea   Associated symptoms: no abdominal pain, no cough, no diaphoresis, no fever, no numbness, no shortness of breath, not vomiting and no weakness   Arm Pain This is a new problem. The current episode started today. The problem occurs intermittently. The problem has been unchanged. Associated symptoms include chest pain, headaches (chronic migraine, denies acute changes), myalgias (L upper arm) and nausea. Pertinent negatives include no abdominal pain, arthralgias, chills, coughing, diaphoresis, fever, joint swelling, numbness, urinary symptoms, vomiting or weakness. Exacerbated by: laying arm on bed. He has tried nothing for the symptoms. The treatment provided no relief.  Anxiety Associated symptoms include chest pain, headaches (chronic migraine, denies acute changes), myalgias (L upper arm) and nausea. Pertinent negatives include no abdominal pain, arthralgias, chills, coughing, diaphoresis, fever, joint swelling, numbness, urinary symptoms, vomiting or weakness.    Past Medical History  Diagnosis Date  . Arthritis   . Enlarged prostate   . Anxiety panic attack  . Panic attack   . Depression   . ADHD (attention deficit hyperactivity disorder)   . Migraine    History reviewed. No pertinent past surgical history. Family History  Problem Relation Age of Onset  . Breast cancer Mother   . Diabetes Mother   .  Osteoarthritis Mother   . Bladder Cancer Father   . Diabetes Father   . Stroke Father   . Hypertension Father   . Pancreatic cancer Father    Social History  Substance Use Topics  . Smoking status: Former Smoker  -- 1.00 packs/day for 38 years    Types: Cigarettes    Quit date: 05/17/2013  . Smokeless tobacco: None     Comment: Also using E-Cig  . Alcohol Use: No    Review of Systems  Constitutional: Negative for fever, chills and diaphoresis.  Respiratory: Negative for cough, shortness of breath and wheezing.   Cardiovascular: Positive for chest pain. Negative for leg swelling.  Gastrointestinal: Positive for nausea. Negative for vomiting, abdominal pain, diarrhea and constipation.  Genitourinary: Negative for dysuria and hematuria.  Musculoskeletal: Positive for myalgias (L upper arm). Negative for joint swelling and arthralgias.  Skin: Negative for color change.  Allergic/Immunologic: Negative for immunocompromised state.  Neurological: Positive for headaches (chronic migraine, denies acute changes). Negative for weakness, light-headedness and numbness.  Psychiatric/Behavioral: Negative for confusion. The patient is nervous/anxious.    10 Systems reviewed and are negative for acute change except as noted in the HPI.    Allergies  Tetracycline; Bactrim; and Effexor  Home Medications   Prior to Admission medications   Medication Sig Start Date End Date Taking? Authorizing Provider  ALPRAZolam Duanne Moron) 1 MG tablet Take 1 mg by mouth 4 (four) times daily.     Historical Provider, MD  b complex vitamins tablet Take 1 tablet by mouth daily.    Historical Provider, MD  BREO ELLIPTA 100-25 MCG/INH AEPB INHALE 1 PUFF INTO THE LUNGS DAILY. 04/07/15   Collene Gobble, MD  Cholecalciferol (VITAMIN D3) 2000 UNITS TABS Take 1 tablet by mouth every evening.     Historical Provider, MD  co-enzyme Q-10 50 MG capsule Take 50 mg by mouth every evening.     Historical Provider, MD  doxazosin (CARDURA) 4 MG tablet 1 tablet daily. 06/13/13   Historical Provider, MD  escitalopram (LEXAPRO) 20 MG tablet Take 20 mg by mouth at bedtime. Take 1/2 table by mouth daily 10/26/14   Historical Provider, MD  fish  oil-omega-3 fatty acids 1000 MG capsule Take 2 g by mouth daily.    Historical Provider, MD  gabapentin (NEURONTIN) 400 MG capsule Take 400 mg by mouth 4 (four) times daily.     Historical Provider, MD  lansoprazole (PREVACID) 15 MG capsule Take 15 mg by mouth daily.    Historical Provider, MD  methylphenidate (RITALIN) 10 MG tablet Take 10 mg by mouth 2 (two) times daily.    Historical Provider, MD  Multiple Vitamins-Minerals (CENTRUM SILVER ADULT 50+ PO) Take by mouth.    Historical Provider, MD  nicotine (NICODERM CQ - DOSED IN MG/24 HOURS) 21 mg/24hr patch Place 21 mg onto the skin daily.    Historical Provider, MD  PROAIR HFA 108 (90 BASE) MCG/ACT inhaler USE 1 PUFF FOUR TIMES A DAY Patient taking differently: Use 1 Puff four times daily as needed for shortness of breath and wheezing 08/23/14   Collene Gobble, MD  Riboflavin (VITAMIN B2 PO) Take 1 tablet by mouth daily.    Historical Provider, MD  rizatriptan (MAXALT) 10 MG tablet Take 1 tablet by mouth as directed. May repeat every 2 hours if needed for migraines, but do not take more than 2 tablets in 24 hours or for more than 2-3 days/week 09/17/14   Historical Provider, MD  Specialty  Vitamins Products (MAGNESIUM, AMINO ACID CHELATE,) 133 MG tablet Take 3 tablets by mouth daily. 750 mg (3 250mg  tablets)    Historical Provider, MD  zonisamide (ZONEGRAN) 25 MG capsule Take 3 capsules by mouth at bedtime. MAY INCREASE BY 1 CAP EVERY WEEK AS NEEDED UP TO 6 AT BEDTIME 10/19/14   Historical Provider, MD   BP 142/84 mmHg  Pulse 72  Temp(Src) 98 F (36.7 C) (Oral)  Resp 18  Ht 5\' 7"  (1.702 m)  Wt 79.379 kg  BMI 27.40 kg/m2  SpO2 97% Physical Exam  Constitutional: He is oriented to person, place, and time. Vital signs are normal. He appears well-developed and well-nourished.  Non-toxic appearance. No distress.  Afebrile, nontoxic, NAD  HENT:  Head: Normocephalic and atraumatic.  Mouth/Throat: Oropharynx is clear and moist and mucous membranes  are normal.  Eyes: Conjunctivae and EOM are normal. Right eye exhibits no discharge. Left eye exhibits no discharge.  Neck: Normal range of motion. Neck supple.  Cardiovascular: Normal rate, regular rhythm, normal heart sounds and intact distal pulses.  Exam reveals no gallop and no friction rub.   No murmur heard. RRR, nl s1/s2, no m/r/g, distal pulses intact, no pedal edema   Pulmonary/Chest: Effort normal and breath sounds normal. No respiratory distress. He has no decreased breath sounds. He has no wheezes. He has no rhonchi. He has no rales. He exhibits no tenderness, no crepitus, no deformity and no retraction.  CTAB in all lung fields, no w/r/r, no hypoxia or increased WOB, speaking in full sentences, SpO2 97% on RA  No chest wall TTP, no crepitus or deformities, no retractions  Abdominal: Soft. Normal appearance and bowel sounds are normal. He exhibits no distension. There is tenderness in the epigastric area. There is no rigidity, no rebound, no guarding, no CVA tenderness, no tenderness at McBurney's point and negative Murphy's sign.    Soft, nondistended, +BS throughout, with mild epigastric TTP, no r/g/r, neg murphy's, neg mcburney's, no CVA TTP   Musculoskeletal: Normal range of motion.       Left upper arm: He exhibits tenderness. He exhibits no bony tenderness, no swelling and no deformity.  L deltoid with minimal TTP without focal bony TTP, no swelling or crepitus, no bruising or skin changes, FROM intact in all joints of all extremities MAE x4 Strength and sensation grossly intact Distal pulses intact No pedal edema, neg homan's bilaterally Gait steady   Neurological: He is alert and oriented to person, place, and time. He has normal strength. No cranial nerve deficit or sensory deficit. Gait normal. GCS eye subscore is 4. GCS verbal subscore is 5. GCS motor subscore is 6.  No focal neuro deficits noted  Skin: Skin is warm, dry and intact. No rash noted.  Psychiatric: He has  a normal mood and affect.  Nursing note and vitals reviewed.   ED Course  Procedures (including critical care time) Labs Review Labs Reviewed  CBC WITH DIFFERENTIAL/PLATELET - Abnormal; Notable for the following:    Monocytes Absolute 1.1 (*)    All other components within normal limits  COMPREHENSIVE METABOLIC PANEL - Abnormal; Notable for the following:    Glucose, Bld 106 (*)    All other components within normal limits  LIPASE, BLOOD  I-STAT TROPOININ, ED    Imaging Review Dg Chest 2 View  05/24/2015  CLINICAL DATA:  Chest pain EXAM: CHEST  2 VIEW COMPARISON:  09/14/2014 FINDINGS: Normal heart size and mediastinal contours. Mild hyperinflation, suspect COPD in this  former smoker. No acute infiltrate or edema. No effusion or pneumothorax. No acute osseous findings. IMPRESSION: No active cardiopulmonary disease. Hyperinflation. Electronically Signed   By: Monte Fantasia M.D.   On: 05/24/2015 00:58     Narrative    -------------------------------------------------------------------- Stress Echocardiography 11/18/2009:  Patient:  Conell, Nardolillo MR #:    [id number] Study Date: 11/18/2009 Gender:   M Age:    41 Height:   170.2cm Weight:   82.7kg BSA:    7m^2 Pt. Status: Room:  SONOGRAPHER Victorio Palm PERFORMING  [facility] Pontes, Site 3 ORDERING   Lauree Chandler cc: Dr Barbette Merino  -------------------------------------------------------------------- Indications:  Chest pain 786.51.  -------------------------------------------------------------------- History: PMH: Acquired from the patient and from the patient's chart. Chest pain. Risk factors: Family history of coronary artery disease. Current tobacco use. Dyslipidemia.  -------------------------------------------------------------------- Study Conclusions  - Stress ECG conclusions: There were no stress arrhythmias or  conduction abnormalities. The stress  ECG was negative for  ischemia. - Staged echo: There was no echocardiographic evidence for  stress-induced ischemia. Bruce protocol. Preprocedural. Stress echocardiography. Height: Height: 170.2cm. Height: 67in. Weight: Weight: 82.7kg. Weight: 182lb. Body mass index: BMI: 28.6kg/m^2. Body surface area: BSA: 45m^2. Blood pressure: 102/69. Patient status: Outpatient.  --------------------------------------------------------------------  -------------------------------------------------------------------- Baseline ECG: Normal.  -------------------------------------------------------------------- Stress protocol:  +---------------+---+-----------+----------------------------------+ Stage     HR BP (mmHg) Symptoms              +---------------+---+-----------+----------------------------------+ Baseline    81 102/69 (80)None                +---------------+---+-----------+----------------------------------+ Stage 1    [id number] (85)None                +---------------+---+-----------+----------------------------------+ Stage 2    [id number] (87)Chest discomfort, Patient said                   discomforts feels like what he has                 with anxiety.            +---------------+---+-----------+----------------------------------+ Stage 3    139177/45 (89)Fatigue               +---------------+---+-----------+----------------------------------+ Recovery; 1 [id number] (80)None                +---------------+---+-----------+----------------------------------+ Recovery; 2 min116-----------None                +---------------+---+-----------+----------------------------------+ Recovery; 3 min111-----------None                 +---------------+---+-----------+----------------------------------+ Recovery; 4 [id number] (69)None                +---------------+---+-----------+----------------------------------+  -------------------------------------------------------------------- Stress results: Maximal heart rate during stress was 157bpm (95% of maximal predicted heart rate). The maximal predicted heart rate was 165bpm. The target heart rate was achieved. The heart rate response to stress was normal. There was a normal resting blood pressure with an appropriate response to stress. The rate-pressure product for the peak heart rate and blood pressure was 24638mm Hg/min. The patient experienced no chest pain during stress.  -------------------------------------------------------------------- Stress ECG: There were no stress arrhythmias or conduction abnormalities. The stress ECG was negative for ischemia.  -------------------------------------------------------------------- Baseline:  - LV global systolic function was normal. - Normal wall motion; no LV regional wall motion abnormalities. Peak stress:  - LV global systolic function was vigorous. - Normal wall motion; no LV regional wall motion abnormalities.  -------------------------------------------------------------------- Stress echo results: Left ventricular ejection fraction was normal  at rest and with stress. There was no echocardiographic evidence for stress-induced ischemia.  -------------------------------------------------------------------- Prepared and Electronically Authenticated by  Jenkins Rouge, MD, [facility] 2011-07-29T15:15:35.280    I have personally reviewed and evaluated these images and lab results as part of my medical decision-making.   EKG Interpretation   Date/Time:  Monday May 23 2015 22:32:32 EST Ventricular Rate:  67 PR Interval:  157 QRS Duration: 94 QT Interval:   402 QTC Calculation: 424 R Axis:   85 Text Interpretation:  Sinus rhythm Borderline right axis deviation  Baseline wander in lead(s) V1 No significant change was found Confirmed by  CAMPOS  MD, KEVIN ([id number]) on 05/24/2015 12:15:08 AM      MDM   Final diagnoses:  Pain of left upper arm  Atypical chest pain  Anxiety attack  Nausea  Hx of gastroesophageal reflux (GERD)  Gastritis  Tobacco use    60 y.o. male here with sudden onset L upper arm pain that started when he had a heated argument over the phone with his brother at 9pm. States he also felt chest fullness but denies this is painful and states this isn't coming from or going to his arm. Some nausea. On exam, NVI in all extremities, no chest wall TTP, mild deltoid tenderness but no focal bony tenderness, with mild epigastric TTP. EKG without acute ischemic changes, no significant changes found from prior. Given symptoms, will get CXR, trop, labs, and give fluids, morphine, ativan, GI cocktail, and ASA. He declines wanting anything else for nausea, aside from GI cocktail. Denies SOB, no tachycardia or hypoxia, doubt PE. He has had stress echo in 2011 which was negative. Pt also reports migraine which he states is due to being in the hospital and that this is his regular migraine, denies any acute changes/worsening or sudden onset. No focal neuro deficits. Will reassess shortly.   1:02 AM Labs unremarkable, trop neg. CXR negative. HEART score 2, doubt need for delta trop or admission. Symptoms likely from anxiety attack from acute stressful situation. Pt feels better. Discussed use of xanax (home med) and ongoing use of prevacid. Smoking cessation advised. Home pain meds discussed (has vicodin at home), or use of tylenol for additional pain. Diet modifications for GERD/gastritis discussed, avoid spicy foods, fatty foods, etc. Avoid NSAIDs on empty stomach. F/up with PCP in 3-5 days for ongoing management and recheck of symptoms. I explained  the diagnosis and have given explicit precautions to return to the ER including for any other new or worsening symptoms. The patient understands and accepts the medical plan as it's been dictated and I have answered their questions. Discharge instructions concerning home care and prescriptions have been given. The patient is STABLE and is discharged to home in good condition.   BP 141/85 mmHg  Pulse 63  Temp(Src) 98 F (36.7 C) (Oral)  Resp 16  Ht 5\' 7"  (1.702 m)  Wt 79.379 kg  BMI 27.40 kg/m2  SpO2 96%  Meds ordered this encounter  Medications  . aspirin chewable tablet 324 mg    Sig:   . gi cocktail (Maalox,Lidocaine,Donnatal)    Sig:   . LORazepam (ATIVAN) injection 0.5 mg    Sig:   . sodium chloride 0.9 % bolus 1,000 mL    Sig:   . morphine 4 MG/ML injection 4 mg    Sig:      Kyle Walters Camprubi-Soms, PA-C 05/24/15 [place], MD 05/24/15 [phone]

## 2015-05-24 ENCOUNTER — Emergency Department (HOSPITAL_COMMUNITY): Payer: Medicare Other

## 2015-05-24 LAB — COMPREHENSIVE METABOLIC PANEL
ALT: 27 U/L (ref 17–63)
AST: 26 U/L (ref 15–41)
Albumin: 4.1 g/dL (ref 3.5–5.0)
Alkaline Phosphatase: 87 U/L (ref 38–126)
Anion gap: 8 (ref 5–15)
BUN: 14 mg/dL (ref 6–20)
CO2: 29 mmol/L (ref 22–32)
Calcium: 9.9 mg/dL (ref 8.9–10.3)
Chloride: 106 mmol/L (ref 101–111)
Creatinine, Ser: 0.79 mg/dL (ref 0.61–1.24)
GFR calc Af Amer: >60 mL/min (ref >60)
GFR calc non Af Amer: >60 mL/min (ref >60)
Glucose, Bld: 106 mg/dL — ABNORMAL HIGH (ref 65–99)
Potassium: 3.9 mmol/L (ref 3.5–5.1)
Sodium: 143 mmol/L (ref 135–145)
Total Bilirubin: 0.3 mg/dL (ref 0.3–1.2)
Total Protein: 7.9 g/dL (ref 6.5–8.1)

## 2015-05-24 LAB — I-STAT TROPONIN, ED: Troponin i, poc: 0.01 ng/mL (ref 0.00–0.08)

## 2015-05-24 LAB — LIPASE, BLOOD: Lipase: 19 U/L (ref 11–51)

## 2015-05-24 NOTE — Discharge Instructions (Signed)
Your nausea/abdominal pain could be from gastritis or an ulcer. Continue taking home prevacid as directed for your GERD/indigestion. Avoid spicy/fatty/acidic foods. Avoid laying down flat within 30 minutes of eating. Avoid NSAIDs like ibuprofen on an empty stomach.  Your arm pain/chest discomfort could be from anxiety from stressful/upsetting phone call this evening. Continue taking home xanax as directed for your anxiety, which may be contributing to today's symptoms. Take your home pain medications, or tylenol/motrin as needed for pain (always on a full stomach), but don't drive or operate machinery while taking home narcotics (such as vicodin/hydrocodone). Stay well hydrated with plenty of water. Stop smoking! Follow up with your regular doctor in 3-5 days for recheck of symptoms and ongoing management after today's visit. Return to the ER for changes or worsening symptoms.    Nonspecific Chest Pain It is often hard to find the cause of chest pain. There is always a chance that your pain could be related to something serious, such as a heart attack or a blood clot in your lungs. Chest pain can also be caused by conditions that are not life-threatening. If you have chest pain, it is very important to follow up with your doctor.  HOME CARE 1. If you were prescribed an antibiotic medicine, finish it all even if you start to feel better. 2. Avoid any activities that cause chest pain. 3. Do not use any tobacco products, including cigarettes, chewing tobacco, or electronic cigarettes. If you need help quitting, ask your doctor. 4. Do not drink alcohol. 5. Take medicines only as told by your doctor. 6. Keep all follow-up visits as told by your doctor. This is important. This includes any further testing if your chest pain does not go away. 7. Your doctor may tell you to keep your head raised (elevated) while you sleep. 8. Make lifestyle changes as told by your doctor. These may include: 1. Getting  regular exercise. Ask your doctor to suggest some activities that are safe for you. 2. Eating a heart-healthy diet. Your doctor or a diet specialist (dietitian) can help you to learn healthy eating options. 3. Maintaining a healthy weight. 4. Managing diabetes, if necessary. 5. Reducing stress. GET HELP IF:  Your chest pain does not go away, even after treatment.  You have a rash with blisters on your chest.  You have a fever. GET HELP RIGHT AWAY IF:  Your chest pain is worse.  You have an increasing cough, or you cough up blood.  You have severe belly (abdominal) pain.  You feel extremely weak.  You pass out (faint).  You have chills.  You have sudden, unexplained chest discomfort.  You have sudden, unexplained discomfort in your arms, back, neck, or jaw.  You have shortness of breath at any time.  You suddenly start to sweat, or your skin gets clammy.  You feel nauseous.  You vomit.  You suddenly feel light-headed or dizzy.  Your heart begins to beat quickly, or it feels like it is skipping beats. These symptoms may be an emergency. Do not wait to see if the symptoms will go away. Get medical help right away. Call your local emergency services (911 in the [place] Do not drive yourself to the hospital.   This information is not intended to replace advice given to you by your health care provider. Make sure you discuss any questions you have with your health care provider.   Document Released: 09/26/2007 Document Revised: 04/30/2014 Document Reviewed: 11/13/2013 Elsevier Interactive Patient Education 2016  [employer]  Musculoskeletal Pain Musculoskeletal pain is muscle and boney aches and pains. These pains can occur in any part of the body. Your caregiver may treat you without knowing the cause of the pain. They may treat you if blood or urine tests, X-rays, and other tests were normal.  CAUSES There is often not a definite cause or reason for these pains. These  pains may be caused by a type of germ (virus). The discomfort may also come from overuse. Overuse includes working out too hard when your body is not fit. Boney aches also come from weather changes. Bone is sensitive to atmospheric pressure changes. HOME CARE INSTRUCTIONS  9. Ask when your test results will be ready. Make sure you get your test results. 10. Only take over-the-counter or prescription medicines for pain, discomfort, or fever as directed by your caregiver. If you were given medications for your condition, do not drive, operate machinery or power tools, or sign legal documents for 24 hours. Do not drink alcohol. Do not take sleeping pills or other medications that may interfere with treatment. 11. Continue all activities unless the activities cause more pain. When the pain lessens, slowly resume normal activities. Gradually increase the intensity and duration of the activities or exercise. 12. During periods of severe pain, bed rest may be helpful. Lay or sit in any position that is comfortable. 13. Putting ice on the injured area. 1. Put ice in a bag. 2. Place a towel between your skin and the bag. 3. Leave the ice on for 15 to 20 minutes, 3 to 4 times a day. 14. Follow up with your caregiver for continued problems and no reason can be found for the pain. If the pain becomes worse or does not go away, it may be necessary to repeat tests or do additional testing. Your caregiver may need to look further for a possible cause. SEEK IMMEDIATE MEDICAL CARE IF:  You have pain that is getting worse and is not relieved by medications.  You develop chest pain that is associated with shortness or breath, sweating, feeling sick to your stomach (nauseous), or throw up (vomit).  Your pain becomes localized to the abdomen.  You develop any new symptoms that seem different or that concern you. MAKE SURE YOU:   Understand these instructions.  Will watch your condition.  Will get help right  away if you are not doing well or get worse.   This information is not intended to replace advice given to you by your health care provider. Make sure you discuss any questions you have with your health care provider.   Document Released: 04/09/2005 Document Revised: 07/02/2011 Document Reviewed: 12/12/2012 Elsevier Interactive Patient Education 2016 [employer]  Nausea, Adult Nausea is the feeling that you have an upset stomach or have to vomit. Nausea by itself is not likely a serious concern, but it may be an early sign of more serious medical problems. As nausea gets worse, it can lead to vomiting. If vomiting develops, there is the risk of dehydration.  CAUSES  15. Viral infections. 19. Food poisoning. 17. Medicines. 18. Pregnancy. 19. Motion sickness. 20. Migraine headaches. 21. Emotional distress. 22. Severe pain from any source. 23. Alcohol intoxication. HOME CARE INSTRUCTIONS  Get plenty of rest.  Ask your caregiver about specific rehydration instructions.  Eat small amounts of food and sip liquids more often.  Take all medicines as told by your caregiver. SEEK MEDICAL CARE IF:  You have not improved after 2  days, or you get worse.  You have a headache. SEEK IMMEDIATE MEDICAL CARE IF:   You have a fever.  You faint.  You keep vomiting or have blood in your vomit.  You are extremely weak or dehydrated.  You have dark or bloody stools.  You have severe chest or abdominal pain. MAKE SURE YOU:  Understand these instructions.  Will watch your condition.  Will get help right away if you are not doing well or get worse.   This information is not intended to replace advice given to you by your health care provider. Make sure you discuss any questions you have with your health care provider.   Document Released: 05/17/2004 Document Revised: 04/30/2014 Document Reviewed: 12/20/2010 Elsevier Interactive Patient Education 2016 [employer]  Panic  Attacks Panic attacks are sudden, short-livedsurges of severe anxiety, fear, or discomfort. They may occur for no reason when you are relaxed, when you are anxious, or when you are sleeping. Panic attacks may occur for a number of reasons:  24. Healthy people occasionally have panic attacks in extreme, life-threatening situations, such as war or natural disasters. Normal anxiety is a protective mechanism of the body that helps [place] react to danger (fight or flight response). 25. Panic attacks are often seen with anxiety disorders, such as panic disorder, social anxiety disorder, generalized anxiety disorder, and phobias. Anxiety disorders cause excessive or uncontrollable anxiety. They may interfere with your relationships or other life activities. 26. Panic attacks are sometimes seen with other mental illnesses, such as depression and posttraumatic stress disorder. 27. Certain medical conditions, prescription medicines, and drugs of abuse can cause panic attacks. SYMPTOMS  Panic attacks start suddenly, peak within 20 minutes, and are accompanied by four or more of the following symptoms:  Pounding heart or fast heart rate (palpitations).  Sweating.  Trembling or shaking.  Shortness of breath or feeling smothered.  Feeling choked.  Chest pain or discomfort.  Nausea or strange feeling in your stomach.  Dizziness, light-headedness, or feeling like you will faint.  Chills or hot flushes.  Numbness or tingling in your lips or hands and feet.  Feeling that things are not real or feeling that you are not yourself.  Fear of losing control or going crazy.  Fear of dying. Some of these symptoms can mimic serious medical conditions. For example, you may think you are having a heart attack. Although panic attacks can be very scary, they are not life threatening. DIAGNOSIS  Panic attacks are diagnosed through an assessment by your health care provider. Your health care provider will ask  questions about your symptoms, such as where and when they occurred. Your health care provider will also ask about your medical history and use of alcohol and drugs, including prescription medicines. Your health care provider may order blood tests or other studies to rule out a serious medical condition. Your health care provider may refer you to a mental health professional for further evaluation. TREATMENT   Most healthy people who have one or two panic attacks in an extreme, life-threatening situation will not require treatment.  The treatment for panic attacks associated with anxiety disorders or other mental illness typically involves counseling with a mental health professional, medicine, or a combination of both. Your health care provider will help determine what treatment is best for you.  Panic attacks due to physical illness usually go away with treatment of the illness. If prescription medicine is causing panic attacks, talk with your health care provider about stopping  the medicine, decreasing the dose, or substituting another medicine.  Panic attacks due to alcohol or drug abuse go away with abstinence. Some adults need professional help in order to stop drinking or using drugs. HOME CARE INSTRUCTIONS   Take all medicines as directed by your health care provider.   Schedule and attend follow-up visits as directed by your health care provider. It is important to keep all your appointments. SEEK MEDICAL CARE IF:  You are not able to take your medicines as prescribed.  Your symptoms do not improve or get worse. SEEK IMMEDIATE MEDICAL CARE IF:   You experience panic attack symptoms that are different than your usual symptoms.  You have serious thoughts about hurting yourself or others.  You are taking medicine for panic attacks and have a serious side effect. MAKE SURE YOU:  Understand these instructions.  Will watch your condition.  Will get help right away if you are not  doing well or get worse.   This information is not intended to replace advice given to you by your health care provider. Make sure you discuss any questions you have with your health care provider.   Document Released: 04/09/2005 Document Revised: 04/14/2013 Document Reviewed: 11/21/2012 Elsevier Interactive Patient Education 2016 [employer].  Smoking Cessation, Tips for Success If you are ready to quit smoking, congratulations! You have chosen to help yourself be healthier. Cigarettes bring nicotine, tar, carbon monoxide, and other irritants into your body. Your lungs, heart, and blood vessels will be able to work better without these poisons. There are many different ways to quit smoking. Nicotine gum, nicotine patches, a nicotine inhaler, or nicotine nasal spray can help with physical craving. Hypnosis, support groups, and medicines help break the habit of smoking. WHAT THINGS CAN I DO TO MAKE QUITTING EASIER?  Here are some tips to help you quit for good: 74. Pick a date when you will quit smoking completely. Tell all of your friends and family about your plan to quit on that date. 29. Do not try to slowly cut down on the number of cigarettes you are smoking. Pick a quit date and quit smoking completely starting on that day. 30. Throw away all cigarettes.  70. Clean and remove all ashtrays from your home, work, and car. 32. On a card, write down your reasons for quitting. Carry the card with you and read it when you get the urge to smoke. 33. Cleanse your body of nicotine. Drink enough water and fluids to keep your urine clear or pale yellow. Do this after quitting to flush the nicotine from your body. 34. Learn to predict your moods. Do not let a bad situation be your excuse to have a cigarette. Some situations in your life might tempt you into wanting a cigarette. 35. Never have "just one" cigarette. It leads to wanting another and another. Remind yourself of your decision to  quit. 36. Change habits associated with smoking. If you smoked while driving or when feeling stressed, try other activities to replace smoking. Stand up when drinking your coffee. Brush your teeth after eating. Sit in a different chair when you read the paper. Avoid alcohol while trying to quit, and try to drink fewer caffeinated beverages. Alcohol and caffeine may urge you to smoke. 37. Avoid foods and drinks that can trigger a desire to smoke, such as sugary or spicy foods and alcohol. 45. Ask people who smoke not to smoke around you. 39. Have something planned to do right after eating  or having a cup of coffee. For example, plan to take a walk or exercise. 40. Try a relaxation exercise to calm you down and decrease your stress. Remember, you may be tense and nervous for the first 2 weeks after you quit, but this will pass. 41. Find new activities to keep your hands busy. Play with a pen, coin, or rubber band. Doodle or draw things on paper. 42. Brush your teeth right after eating. This will help cut down on the craving for the taste of tobacco after meals. You can also try mouthwash.  43. Use oral substitutes in place of cigarettes. Try using lemon drops, carrots, cinnamon sticks, or chewing gum. Keep them handy so they are available when you have the urge to smoke. 44. When you have the urge to smoke, try deep breathing. 45. Designate your home as a nonsmoking area. 46. If you are a heavy smoker, ask your health care provider about a prescription for nicotine chewing gum. It can ease your withdrawal from nicotine. 47. Reward yourself. Set aside the cigarette money you save and buy yourself something nice. 48. Look for support from others. Join a support group or smoking cessation program. Ask someone at home or at work to help you with your plan to quit smoking. 49. Always ask yourself, "Do I need this cigarette or is this just a reflex?" Tell yourself, "Today, I choose not to smoke," or "I do not  want to smoke." You are reminding yourself of your decision to quit. 50. Do not replace cigarette smoking with electronic cigarettes (commonly called e-cigarettes). The safety of e-cigarettes is unknown, and some may contain harmful chemicals. 51. If you relapse, do not give up! Plan ahead and think about what you will do the next time you get the urge to smoke. HOW WILL I FEEL WHEN I QUIT SMOKING? You may have symptoms of withdrawal because your body is used to nicotine (the addictive substance in cigarettes). You may crave cigarettes, be irritable, feel very hungry, cough often, get headaches, or have difficulty concentrating. The withdrawal symptoms are only temporary. They are strongest when you first quit but will go away within 10-14 days. When withdrawal symptoms occur, stay in control. Think about your reasons for quitting. Remind yourself that these are signs that your body is healing and getting used to being without cigarettes. Remember that withdrawal symptoms are easier to treat than the major diseases that smoking can cause.  Even after the withdrawal is over, expect periodic urges to smoke. However, these cravings are generally short lived and will go away whether you smoke or not. Do not smoke! WHAT RESOURCES ARE AVAILABLE TO HELP ME QUIT SMOKING? Your health care provider can direct you to community resources or hospitals for support, which may include:  Group support.  Education.  Hypnosis.  Therapy.   This information is not intended to replace advice given to you by your health care provider. Make sure you discuss any questions you have with your health care provider.   Document Released: 01/06/2004 Document Revised: 04/30/2014 Document Reviewed: 09/25/2012 Elsevier Interactive Patient Education 2016 Else[employer] Inc.  Gastritis, Adult Gastritis is soreness and puffiness (inflammation) of the lining of the stomach. If you do not get help, gastritis can cause bleeding and sores  (ulcers) in the stomach. HOME CARE  52. Only take medicine as told by your doctor. 53. If you were given antibiotic medicines, take them as told. Finish the medicines even if you start to feel better.  54. Drink enough fluids to keep your pee (urine) clear or pale yellow. 55. Avoid foods and drinks that make your problems worse. Foods you may want to avoid include: 1. Caffeine or alcohol. 2. Chocolate. 3. Mint. 4. Garlic and onions. 5. Spicy foods. 6. Citrus fruits, including oranges, lemons, or limes. 7. Food containing tomatoes, including sauce, chili, salsa, and pizza. 8. Fried and fatty foods. 56. Eat small meals throughout the day instead of large meals. GET HELP RIGHT AWAY IF:   You have black or dark red poop (stools).  You throw up (vomit) blood. It may look like coffee grounds.  You cannot keep fluids down.  Your belly (abdominal) pain gets worse.  You have a fever.  You do not feel better after 1 week.  You have any other questions or concerns. MAKE SURE YOU:   Understand these instructions.  Will watch your condition.  Will get help right away if you are not doing well or get worse.   This information is not intended to replace advice given to you by your health care provider. Make sure you discuss any questions you have with your health care provider.   Document Released: 09/26/2007 Document Revised: 07/02/2011 Document Reviewed: 05/23/2011 Elsevier Interactive Patient Education [employer].

## 2015-05-24 NOTE — ED Notes (Signed)
Patient transported to X-ray 

## 2015-06-02 ENCOUNTER — Encounter: Payer: Self-pay | Admitting: Emergency Medicine

## 2015-06-02 ENCOUNTER — Ambulatory Visit (INDEPENDENT_AMBULATORY_CARE_PROVIDER_SITE_OTHER): Payer: Medicare Other | Admitting: Emergency Medicine

## 2015-06-02 VITALS — BP 128/80 | HR 84 | Ht 67.0 in | Wt 176.0 lb

## 2015-06-02 DIAGNOSIS — J449 Chronic obstructive pulmonary disease, unspecified: Secondary | ICD-10-CM | POA: Diagnosis not present

## 2015-06-02 NOTE — Patient Instructions (Addendum)
Please continue your Breo as you have been taking it Follow with Dr Lamonte Sakai in 12 months or sooner if you have any problems

## 2015-06-02 NOTE — Progress Notes (Signed)
Subjective:    Patient ID: Kyle Walters, male    DOB: Aug 21, 1955, 60 y.o.   MRN: [id number]  HPI 70- yo man, hx tobacco use (1.5 pk/day x 39 yrs), arthritis, anxiety/panic, migraines. Presents today to discuss dyspnea, he first started to notice Dec '13, then again in Spring 2014. Went to the ED in June after an episode of dyspnea, HA. A CXR at that time was hyperinflated. He was given albuterol that he uses qid on a schedule. No cough. He does sometimes hear wheeze, this was more prominent when he smoked more. His exertion is limited by hip pain, but he is also limited by breathing at times. He was not tried on anti-cholinergic due to prostate enlargement.    ROV 02/25/13 -- 60 yo man with COPD. Last time we started breo to see if he would benefit (urinary retention so avoided spiriva). a1-AT is MM. He did not have PFT > . Returns stating that he is having fatigue, weakness, has some muscle soreness and cramping, ascribes it to the [place]. The sx came on slowly, seemed to be leaving slowly. It did seem to help his breathing.   ROV 05/13/13 -- follows for COPD. We stopped Breo after he had muscle cramping, ? Side effect. Also avoided Spiriva due to urinary retention. Started Symbicort last visit, stopped it due to same sx. In retrospect  He believes that diet and decreased protein intake contributed to his muscle complaint > better on Whey protein. He has mae efforts to stop smoking > just stopped on 05/10/13. He is on 21mg  nicotine, nicotine gum. He is interested in trying [place] again.   ROV 07/08/13 -- follow up for COPD. We tried again to start Breo in January, he has tolerated. Believes that he is benefiting > less CP, increasing his exercise. Not smoking. No flares, no new issues.   ROV 01/05/14 -- follows for hx COPD. FEV1 1.43L (44%) with a BD response. His breathing has been stable, but a lot of other things going on > lost his job, death in the family. Has not been limited by his breathing. Rare  albuterol use. He continues to use nicotine patches from time to time.   Acute OV : COPD  09/14/14 --  Complains over last 2-3 months more dyspnea on/off .  Sometimes worse when he tries to concentrate on things , at rest and bedtime .  Comes and goes.  Rides stationary bike without trouble with no dyspnea.  No desats with walk in office  No fever, chest pain, edema, calf pain, hemoptysis , wt loss. .  No cough or wheezing .  Still smokes on/off. Advised on cessation.  Does complains of migraines and anxiety -worse than usual.  Has reflux but controlled on PPI.     ROV 11/18/14 -- follow-up visit for COPD with asthmatic component and a positive bronchodilator response. He is currently managed on Breo, albuterol prn. He underwent repeat pulmonary function testing on 11/10/14 that I personally reviewed today. This shows an improvement in his FEV1 from 1.43 L back in November 2014 to 2.15 L or 69% of predicted. His total lung capacity is decreased as is his diffusion capacity although this corrects for alveolar volume.  I reviewed his chest x-ray from 09/15/14 which shows no infiltrates and a normal cardiac shadow. He asked several questions today about PFT, BP. Of note he was on diltiazem and queried whether this was effecting his functional status.   ROV 06/02/15 -- patient with a  history of COPD with a positive bronchodilator response and an asthmatic component. He also has been evidence for coexisting restrictive disease on lung volumes. He is being managed on breo qd.  Tolerating well, no side effects. Believes that it is helping him. He is more active. No significant changes in cough, sputum. No flares.  He has been dealing with sprained foot, torn knee miniscus.    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   CXR 09/30/12 Comparison: CT abdomen and pelvis 02/14/2010.  Findings: The heart is enlarged. Emphysematous changes are  evident. No focal airspace disease is present. There is no edema  or effusion to  suggest failure. Chronic end plate changes are  noted.  IMPRESSION:  1. Emphysema.  2. Cardiomegaly without failure.  3. No acute cardiopulmonary disease     Objective:   Physical Exam Filed Vitals:   06/02/15 1605 06/02/15 1606  BP:  128/80  Pulse:  84  Height: 5\' 7"  (1.702 m)   Weight: 176 lb (79.833 kg)   SpO2:  95%    Gen: Pleasant, well-nourished, in no distress,  normal affect  ENT: No lesions,  mouth clear,  oropharynx clear, no postnasal drip  Neck: No JVD, no TMG, no carotid bruits  Lungs: No use of accessory muscles, no dullness to percussion, clear without rales or rhonchi  Cardiovascular: RRR, heart sounds normal, no murmur or gallops, no peripheral edema  Musculoskeletal: No deformities, no cyanosis or clubbing  Neuro: awake, a bit slow to   Skin: Warm, no lesions or rashes     Assessment & Plan:  COPD (chronic obstructive pulmonary disease) Stable at this time on breo. No side effects from the medication. No flares or exacerbations. His exercise tolerance is actually improved. We discussed briefly potentially transitioning all minus medication at some point in the future. But he is doing very well and we will continue the current regimen at this time. I will see him in 1 year or sooner if he has any problems.

## 2015-06-02 NOTE — Assessment & Plan Note (Signed)
Stable at this time on breo. No side effects from the medication. No flares or exacerbations. His exercise tolerance is actually improved. We discussed briefly potentially transitioning all minus medication at some point in the future. But he is doing very well and we will continue the current regimen at this time. I will see him in 1 year or sooner if he has any problems.

## 2015-07-19 ENCOUNTER — Other Ambulatory Visit: Payer: Self-pay | Admitting: Emergency Medicine

## 2015-10-18 ENCOUNTER — Other Ambulatory Visit: Payer: Self-pay | Admitting: Emergency Medicine

## 2016-02-07 DIAGNOSIS — H6123 Impacted cerumen, bilateral: Secondary | ICD-10-CM | POA: Insufficient documentation

## 2016-02-07 DIAGNOSIS — H6063 Unspecified chronic otitis externa, bilateral: Secondary | ICD-10-CM | POA: Insufficient documentation

## 2016-03-21 ENCOUNTER — Other Ambulatory Visit: Payer: Self-pay | Admitting: *Deleted

## 2016-03-21 ENCOUNTER — Other Ambulatory Visit: Payer: Self-pay | Admitting: Neurology

## 2016-03-21 DIAGNOSIS — M542 Cervicalgia: Secondary | ICD-10-CM

## 2016-03-21 DIAGNOSIS — S0990XA Unspecified injury of head, initial encounter: Secondary | ICD-10-CM

## 2016-03-22 ENCOUNTER — Ambulatory Visit
Admission: RE | Admit: 2016-03-22 | Discharge: 2016-03-22 | Disposition: A | Discharge status: Home / Self Care | Payer: Medicare Other | Source: Ambulatory Visit | Attending: Neurology | Admitting: Neurology

## 2016-03-22 DIAGNOSIS — S0990XA Unspecified injury of head, initial encounter: Secondary | ICD-10-CM

## 2016-03-22 DIAGNOSIS — M542 Cervicalgia: Secondary | ICD-10-CM

## 2016-03-27 ENCOUNTER — Other Ambulatory Visit: Payer: Medicare Other

## 2016-04-14 ENCOUNTER — Other Ambulatory Visit: Payer: Self-pay | Admitting: Emergency Medicine

## 2016-06-07 ENCOUNTER — Ambulatory Visit (INDEPENDENT_AMBULATORY_CARE_PROVIDER_SITE_OTHER): Payer: Medicare Other | Admitting: Emergency Medicine

## 2016-06-07 DIAGNOSIS — J449 Chronic obstructive pulmonary disease, unspecified: Secondary | ICD-10-CM | POA: Diagnosis not present

## 2016-06-07 MED ORDER — ALBUTEROL SULFATE HFA 108 (90 BASE) MCG/ACT IN AERS: 2.0000 | INHALATION_SPRAY | Freq: Four times a day (QID) | RESPIRATORY_TRACT | 1 refills | Status: DC | PRN

## 2016-06-07 NOTE — Patient Instructions (Signed)
Please continue your Breo once a day Keep albuterol available to use 2 puffs if needed for shortness of breath.  Follow with Dr Jaymi Tinner in 12 months or sooner if you have any problems 

## 2016-06-07 NOTE — Progress Notes (Signed)
Subjective:    Patient ID: Kyle Walters, male    DOB: Nov 02, 1955, 60 y.o.   MRN: [id number]  HPI 46- yo man, hx tobacco use (1.5 pk/day x 39 yrs), arthritis, anxiety/panic, migraines. Presents today to discuss dyspnea, he first started to notice Dec '13, then again in Spring 2014. Went to the ED in June after an episode of dyspnea, HA. A CXR at that time was hyperinflated. He was given albuterol that he uses qid on a schedule. No cough. He does sometimes hear wheeze, this was more prominent when he smoked more. His exertion is limited by hip pain, but he is also limited by breathing at times. He was not tried on anti-cholinergic due to prostate enlargement.    ROV 02/25/13 -- 61 yo man with COPD. Last time we started breo to see if he would benefit (urinary retention so avoided spiriva). a1-AT is MM. He did not have PFT > . Returns stating that he is having fatigue, weakness, has some muscle soreness and cramping, ascribes it to the [place]. The sx came on slowly, seemed to be leaving slowly. It did seem to help his breathing.   ROV 05/13/13 -- follows for COPD. We stopped Breo after he had muscle cramping, ? Side effect. Also avoided Spiriva due to urinary retention. Started Symbicort last visit, stopped it due to same sx. In retrospect  He believes that diet and decreased protein intake contributed to his muscle complaint > better on Whey protein. He has mae efforts to stop smoking > just stopped on 05/10/13. He is on 21mg  nicotine, nicotine gum. He is interested in trying [place] again.   ROV 07/08/13 -- follow up for COPD. We tried again to start Breo in January, he has tolerated. Believes that he is benefiting > less CP, increasing his exercise. Not smoking. No flares, no new issues.   ROV 01/05/14 -- follows for hx COPD. FEV1 1.43L (44%) with a BD response. His breathing has been stable, but a lot of other things going on > lost his job, death in the family. Has not been limited by his breathing. Rare  albuterol use. He continues to use nicotine patches from time to time.   Acute OV : COPD  09/14/14 --  Complains over last 2-3 months more dyspnea on/off .  Sometimes worse when he tries to concentrate on things , at rest and bedtime .  Comes and goes.  Rides stationary bike without trouble with no dyspnea.  No desats with walk in office  No fever, chest pain, edema, calf pain, hemoptysis , wt loss. .  No cough or wheezing .  Still smokes on/off. Advised on cessation.  Does complains of migraines and anxiety -worse than usual.  Has reflux but controlled on PPI.     ROV 11/18/14 -- follow-up visit for COPD with asthmatic component and a positive bronchodilator response. He is currently managed on Breo, albuterol prn. He underwent repeat pulmonary function testing on 11/10/14 that I personally reviewed today. This shows an improvement in his FEV1 from 1.43 L back in November 2014 to 2.15 L or 69% of predicted. His total lung capacity is decreased as is his diffusion capacity although this corrects for alveolar volume.  I reviewed his chest x-ray from 09/15/14 which shows no infiltrates and a normal cardiac shadow. He asked several questions today about PFT, BP. Of note he was on diltiazem and queried whether this was effecting his functional status.   ROV 06/02/15 -- patient with a  history of COPD with a positive bronchodilator response and an asthmatic component. He also has been evidence for coexisting restrictive disease on lung volumes. He is being managed on breo qd.  Tolerating well, no side effects. Believes that it is helping him. He is more active. No significant changes in cough, sputum. No flares.  He has been dealing with sprained foot, torn knee miniscus.   ROV 06/07/16 -- this is a follow-up visit for patient with a history of COPD with asthmatic component based on spirometry. He also has some restrictive lung disease. Since last time he suffered a trauma, hit his head in 02/2017. Some  post-concussion syndrome, couldn't do his cycling. Lost some of his pulm reserves. He tolerates breo, feels that it helps him. He uses albuterol very rarely.    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   CXR 09/30/12 Comparison: CT abdomen and pelvis 02/14/2010.  Findings: The heart is enlarged. Emphysematous changes are  evident. No focal airspace disease is present. There is no edema  or effusion to suggest failure. Chronic end plate changes are  noted.  IMPRESSION:  1. Emphysema.  2. Cardiomegaly without failure.  3. No acute cardiopulmonary disease     Objective:   Physical Exam Vitals:   06/07/16 1550  BP: 124/74  Pulse: 69  SpO2: 96%  Weight: 73.3 kg (161 lb 9.6 oz)  Height: 5\' 7"  (1.702 m)    Gen: Pleasant, well-nourished, in no distress,  normal affect  ENT: No lesions,  mouth clear,  oropharynx clear, no postnasal drip  Neck: No JVD, no TMG, no carotid bruits  Lungs: No use of accessory muscles, no dullness to percussion, clear without rales or rhonchi  Cardiovascular: RRR, heart sounds normal, no murmur or gallops, no peripheral edema  Musculoskeletal: No deformities, no cyanosis or clubbing  Neuro: awake, a bit slow to   Skin: Warm, no lesions or rashes     Assessment & Plan:  COPD (chronic obstructive pulmonary disease) Please continue your Breo once a day Keep albuterol available to use 2 puffs if needed for shortness of breath.  Follow with Dr Lamonte Sakai in 12 months or sooner if you have any problems  Baltazar Apo, MD, PhD [facility] [phone] or if no answer [phone]

## 2016-09-12 ENCOUNTER — Encounter: Payer: Self-pay | Admitting: Emergency Medicine

## 2016-09-12 NOTE — Assessment & Plan Note (Signed)
Please continue your Breo once a day Keep albuterol available to use 2 puffs if needed for shortness of breath.  Follow with Dr Lamonte Sakai in 12 months or sooner if you have any problems

## 2016-12-07 ENCOUNTER — Other Ambulatory Visit: Payer: Self-pay | Admitting: Emergency Medicine

## 2017-03-01 IMAGING — CR DG CHEST 2V
2 series · 2 of 2 positions shown · non-contrast
Comparison: 09/14/2014

CLINICAL DATA: Chest pain

EXAM:
CHEST  2 VIEW

[w chest pa]
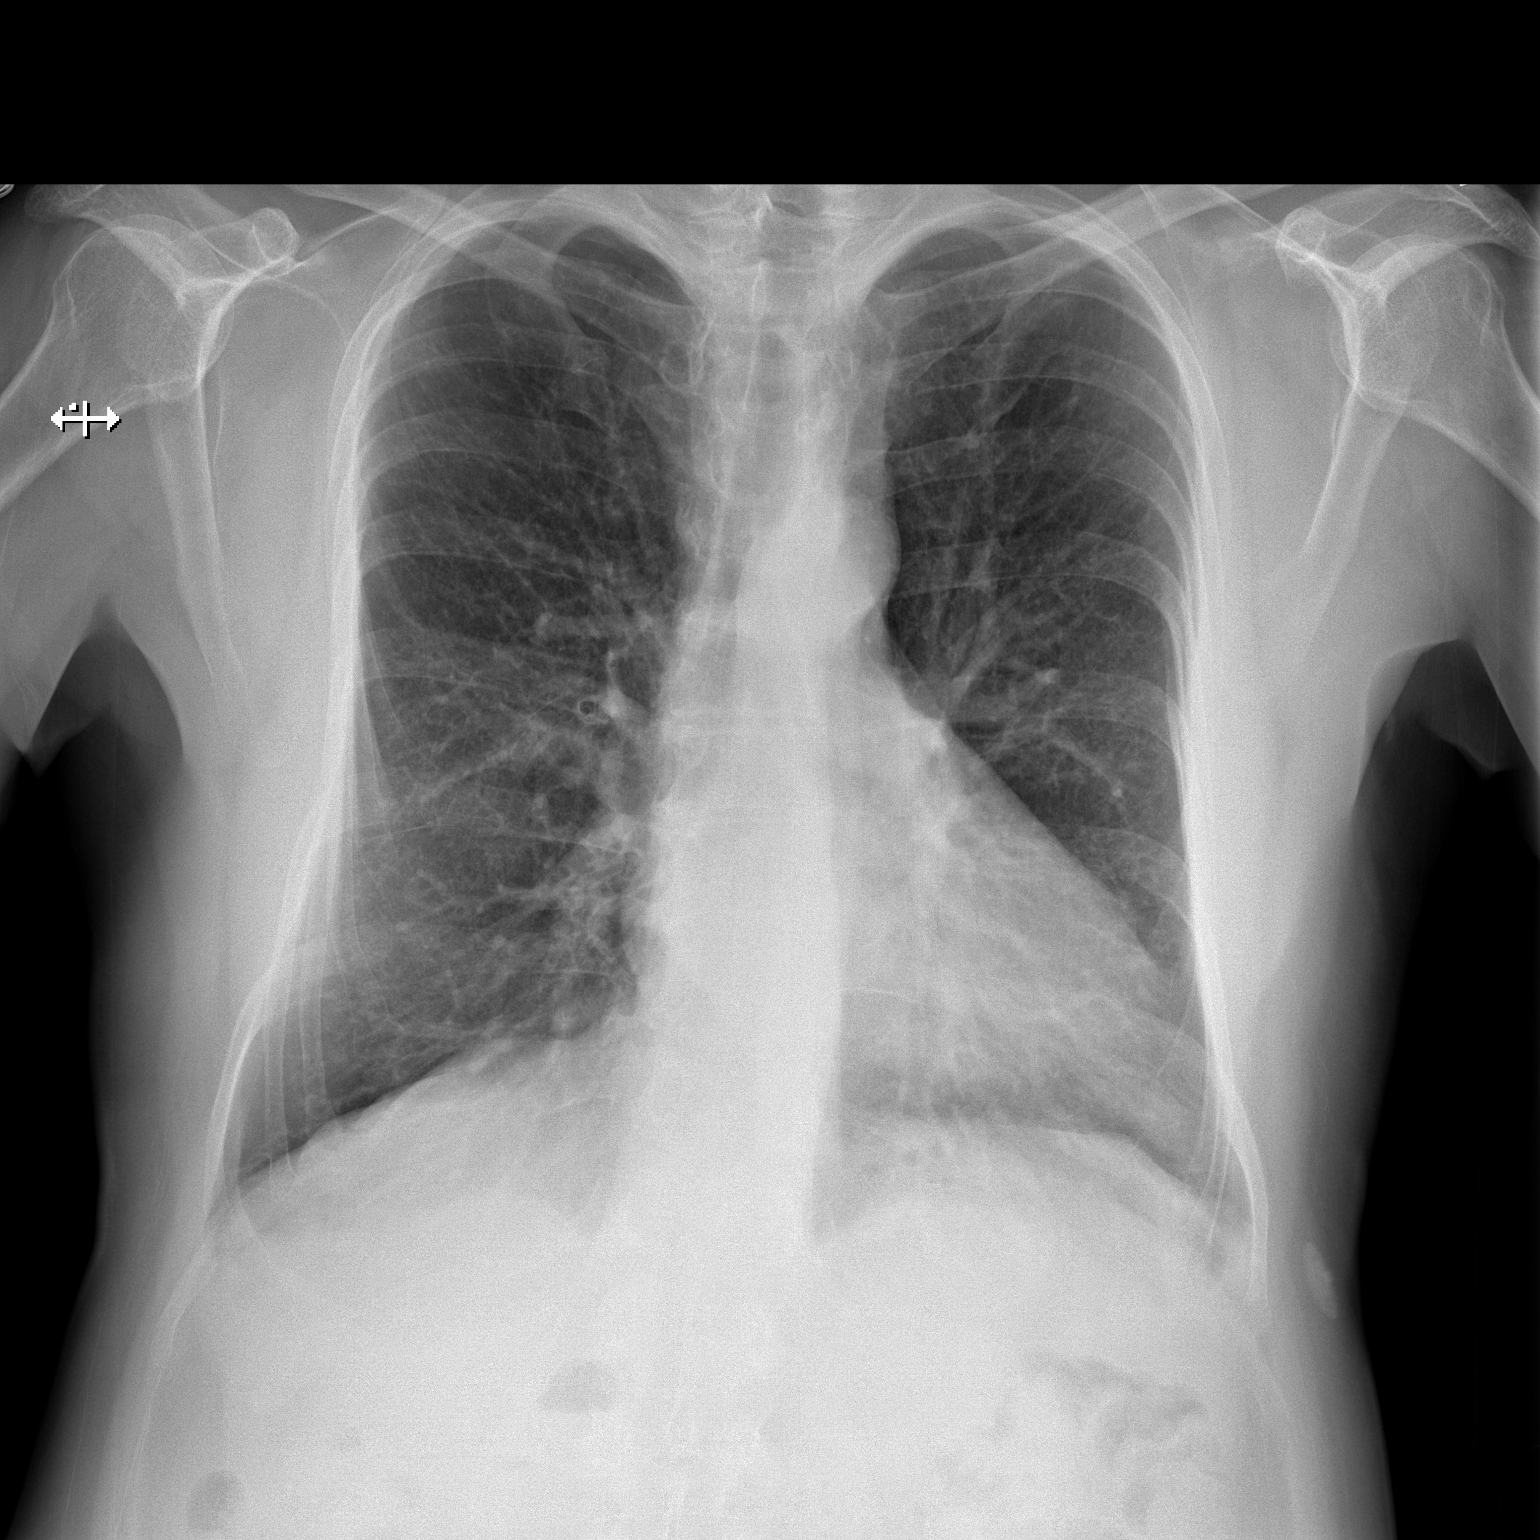

[w chest lat]
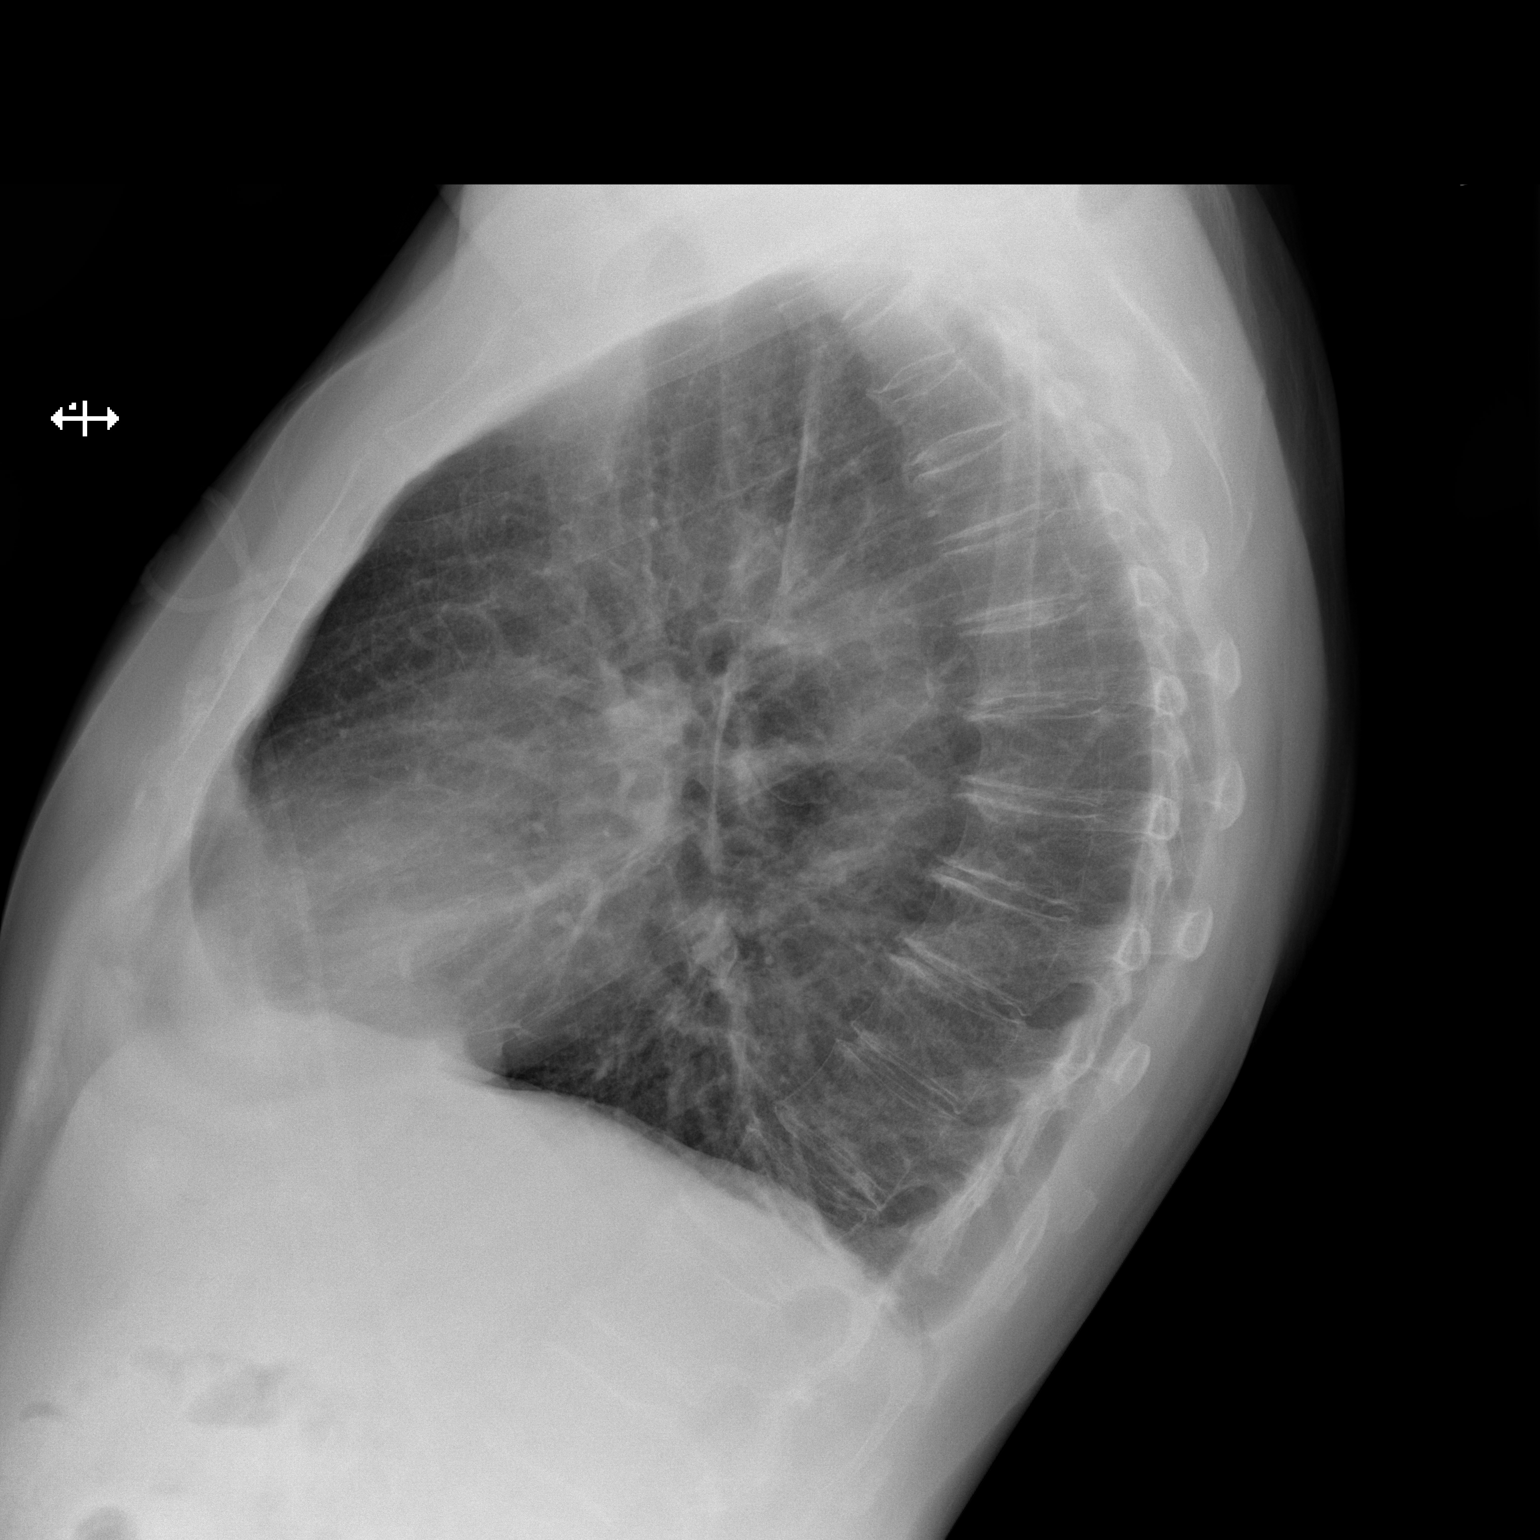

[2 of 2 positions shown; findings below may reference images not displayed]

FINDINGS: Normal heart size and mediastinal contours. Mild hyperinflation,
suspect COPD in this former smoker. No acute infiltrate or edema. No
effusion or pneumothorax. No acute osseous findings.
IMPRESSION: No active cardiopulmonary disease.

Hyperinflation.

## 2017-03-29 DIAGNOSIS — M26609 Unspecified temporomandibular joint disorder, unspecified side: Secondary | ICD-10-CM | POA: Insufficient documentation

## 2017-06-25 ENCOUNTER — Encounter: Payer: Self-pay | Admitting: Emergency Medicine

## 2017-06-25 ENCOUNTER — Ambulatory Visit (INDEPENDENT_AMBULATORY_CARE_PROVIDER_SITE_OTHER): Payer: Medicare Other | Admitting: Emergency Medicine

## 2017-06-25 DIAGNOSIS — J449 Chronic obstructive pulmonary disease, unspecified: Secondary | ICD-10-CM

## 2017-06-25 NOTE — Patient Instructions (Signed)
Please continue Breo as you have been taking it.  Remember to rinse and gargle after using this medication. Keep albuterol available to use 2 puffs if needed for chest tightness, shortness of breath, wheezing. Agree with slowly increasing your exercise routine. Follow with Dr Lamonte Sakai in 12 months or sooner if you have any problems

## 2017-06-25 NOTE — Assessment & Plan Note (Signed)
No exacerbations since last time.  He rarely uses albuterol.  His daily symptoms are well controlled.  He rinses and gargles after using his Brio.  We will continue his current regimen.  Follow annually and as needed.  Please continue Breo as you have been taking it.  Remember to rinse and gargle after using this medication. Keep albuterol available to use 2 puffs if needed for chest tightness, shortness of breath, wheezing. Agree with slowly increasing your exercise routine. Follow with Dr Lamonte Sakai in 12 months or sooner if you have any problems

## 2017-06-25 NOTE — Progress Notes (Signed)
Subjective:    Patient ID: Kyle Walters, male    DOB: Sep 14, 1955, 62 y.o.   MRN: [id number]  HPI 53- yo man, hx tobacco use (1.5 pk/day x 39 yrs), arthritis, anxiety/panic, migraines. Presents today to discuss dyspnea, he first started to notice Dec '13, then again in Spring 2014. Went to the ED in June after an episode of dyspnea, HA. A CXR at that time was hyperinflated. He was given albuterol that he uses qid on a schedule. No cough. He does sometimes hear wheeze, this was more prominent when he smoked more. His exertion is limited by hip pain, but he is also limited by breathing at times. He was not tried on anti-cholinergic due to prostate enlargement.    ROV 02/25/13 -- 62 yo man with COPD. Last time we started breo to see if he would benefit (urinary retention so avoided spiriva). a1-AT is MM. He did not have PFT > . Returns stating that he is having fatigue, weakness, has some muscle soreness and cramping, ascribes it to the [place]. The sx came on slowly, seemed to be leaving slowly. It did seem to help his breathing.   ROV 05/13/13 -- follows for COPD. We stopped Breo after he had muscle cramping, ? Side effect. Also avoided Spiriva due to urinary retention. Started Symbicort last visit, stopped it due to same sx. In retrospect  He believes that diet and decreased protein intake contributed to his muscle complaint > better on Whey protein. He has mae efforts to stop smoking > just stopped on 05/10/13. He is on 21mg  nicotine, nicotine gum. He is interested in trying [place] again.   ROV 07/08/13 -- follow up for COPD. We tried again to start Breo in January, he has tolerated. Believes that he is benefiting > less CP, increasing his exercise. Not smoking. No flares, no new issues.   ROV 01/05/14 -- follows for hx COPD. FEV1 1.43L (44%) with a BD response. His breathing has been stable, but a lot of other things going on > lost his job, death in the family. Has not been limited by his breathing. Rare  albuterol use. He continues to use nicotine patches from time to time.   Acute OV : COPD  09/14/14 --  Complains over last 2-3 months more dyspnea on/off .  Sometimes worse when he tries to concentrate on things , at rest and bedtime .  Comes and goes.  Rides stationary bike without trouble with no dyspnea.  No desats with walk in office  No fever, chest pain, edema, calf pain, hemoptysis , wt loss. .  No cough or wheezing .  Still smokes on/off. Advised on cessation.  Does complains of migraines and anxiety -worse than usual.  Has reflux but controlled on PPI.     ROV 11/18/14 -- follow-up visit for COPD with asthmatic component and a positive bronchodilator response. He is currently managed on Breo, albuterol prn. He underwent repeat pulmonary function testing on 11/10/14 that I personally reviewed today. This shows an improvement in his FEV1 from 1.43 L back in November 2014 to 2.15 L or 69% of predicted. His total lung capacity is decreased as is his diffusion capacity although this corrects for alveolar volume.  I reviewed his chest x-ray from 09/15/14 which shows no infiltrates and a normal cardiac shadow. He asked several questions today about PFT, BP. Of note he was on diltiazem and queried whether this was effecting his functional status.   ROV 06/02/15 -- patient with a  history of COPD with a positive bronchodilator response and an asthmatic component. He also has been evidence for coexisting restrictive disease on lung volumes. He is being managed on breo qd.  Tolerating well, no side effects. Believes that it is helping him. He is more active. No significant changes in cough, sputum. No flares.  He has been dealing with sprained foot, torn knee miniscus.   ROV 06/07/16 -- this is a follow-up visit for patient with a history of COPD with asthmatic component based on spirometry. He also has some restrictive lung disease. Since last time he suffered a trauma, hit his head in 02/2017. Some  post-concussion syndrome, couldn't do his cycling. Lost some of his pulm reserves. He tolerates breo, feels that it helps him. He uses albuterol very rarely.   ROV 06/25/17 --follow-up visit for patient with a history of COPD with a positive bronchodilator response on spirometry, some associated restrictive lung disease.  Last seen 1 year ago.  Has been managed on Breo.  He has albuterol to use if needed. He was able to get back to some exercise since last time, although he has been limited by migraine HA's. Very rare albuterol use. Tolerates Breo. No exacerbations since last time.    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   CXR 09/30/12 Comparison: CT abdomen and pelvis 02/14/2010.  Findings: The heart is enlarged. Emphysematous changes are  evident. No focal airspace disease is present. There is no edema  or effusion to suggest failure. Chronic end plate changes are  noted.  IMPRESSION:  1. Emphysema.  2. Cardiomegaly without failure.  3. No acute cardiopulmonary disease     Objective:   Physical Exam Vitals:   06/25/17 1437  BP: (!) 140/92  Pulse: 75  SpO2: 95%  Weight: 173 lb (78.5 kg)  Height: 5\' 7"  (1.702 m)    Gen: Pleasant, well-nourished, in no distress,  normal affect  ENT: No lesions,  mouth clear,  oropharynx clear, no postnasal drip  Neck: No JVD, no stridor  Lungs: No use of accessory muscles, clear without rales or rhonchi  Cardiovascular: RRR, heart sounds normal, no murmur or gallops, no peripheral edema  Musculoskeletal: No deformities, no cyanosis or clubbing  Neuro: awake, a bit slow to   Skin: Warm, no lesions or rashes     Assessment & Plan:  COPD (chronic obstructive pulmonary disease) No exacerbations since last time.  He rarely uses albuterol.  His daily symptoms are well controlled.  He rinses and gargles after using his Brio.  We will continue his current regimen.  Follow annually and as needed.  Please continue Breo as you have been taking it.  Remember  to rinse and gargle after using this medication. Keep albuterol available to use 2 puffs if needed for chest tightness, shortness of breath, wheezing. Agree with slowly increasing your exercise routine. Follow with Dr Lamonte Sakai in 12 months or sooner if you have any problems  Baltazar Apo, MD, PhD [facility] [phone] or if no answer [phone]

## 2017-07-18 ENCOUNTER — Other Ambulatory Visit: Payer: Self-pay | Admitting: Emergency Medicine

## 2017-11-01 NOTE — Progress Notes (Signed)
Office Visit Note  Patient: Kyle Walters             Date of Birth: 1955/11/04           MRN: [id number]             PCP: Elwyn Reach, MD Referring: Elwyn Reach, MD Visit Date: 11/15/2017 Occupation: on disability, Chief of Staff at [employer]  Subjective:  Pain and swelling in multiple joints   History of Present Illness: Kyle Walters is a 62 y.o. male in consultation per request of his PCP.  According to patient in May 2019 he developed 102.7 fever for which he is took some Tylenol and it came down.  He states the fever lingered for about 1 week and then resolved.  He started noticing pain and swelling in multiple joints and also aching in his muscles.  He was also extremely tired.  He went to see his PCP where he had lab work which showed positive ANA.  He was also given prednisone taper he stopped prednisone taper after 4 days due to hallucinations.  He states the prednisone did help his joint swelling.  He complains of joint pain and swelling in his bilateral shoulders, bilateral elbows, bilateral hands, bilateral hip joints, knee joints, ankles and feet.  He also has been having discomfort in his spine.  He states the pain is generalized.  He also has achiness in his muscles.  He took some Celebrex but discontinued due to GI side effects.  He has been taking Mobic now which helps to some extent.  He gives positive family history of psoriasis on his maternal side.  Activities of Daily Living:  Patient reports morning stiffness for 24 hours.   Patient Denies nocturnal pain.  Difficulty dressing/grooming: Reports Difficulty climbing stairs: Reports Difficulty getting out of chair: Denies Difficulty using hands for taps, buttons, cutlery, and/or writing: Reports  Review of Systems  Constitutional: Positive for fatigue. Negative for activity change and night sweats.  HENT: Negative for mouth sores, mouth dryness and nose dryness.   Eyes: Positive for redness. Negative for  dryness.  Respiratory: Positive for shortness of breath. Negative for difficulty breathing.   Cardiovascular: Positive for swelling in legs/feet. Negative for chest pain, palpitations, hypertension and irregular heartbeat.  Gastrointestinal: Positive for constipation, diarrhea and heartburn.  Endocrine: Negative for increased urination.  Genitourinary: Negative for difficulty urinating.  Musculoskeletal: Positive for arthralgias, gait problem, joint pain, joint swelling, morning stiffness and muscle tenderness. Negative for myalgias, muscle weakness and myalgias.  Skin: Positive for rash and hair loss. Negative for color change, nodules/bumps, skin tightness, ulcers and sensitivity to sunlight.  Allergic/Immunologic: Negative for susceptible to infections.  Neurological: Positive for numbness, headaches and weakness. Negative for dizziness, fainting, memory loss and night sweats.       H/o migraine  Hematological: Negative for bruising/bleeding tendency and swollen glands.  Psychiatric/Behavioral: Negative for depressed mood and sleep disturbance. The patient is not nervous/anxious.     PMFS History:  Patient Active Problem List   Diagnosis Date Noted  . COPD (chronic obstructive pulmonary disease) ([place]) 01/29/2013  . CHEST PAIN-PRECORDIAL 11/01/2009  . OSTEOARTHRITIS 10/12/2009  . CHEST PAIN 10/12/2009    Past Medical History:  Diagnosis Date  . ADHD (attention deficit hyperactivity disorder)   . Anxiety panic attack  . Arthritis   . Depression   . Emphysema lung ([place])   . Enlarged prostate   . Migraine   . Panic attack  Family History  Problem Relation Age of Onset  . Breast cancer Mother   . Diabetes Mother   . Osteoarthritis Mother   . Bladder Cancer Father   . Diabetes Father   . Stroke Father   . Hypertension Father   . Pancreatic cancer Father    History reviewed. No pertinent surgical history. Social History   Social History Narrative  . Not on file     Objective: Vital Signs: BP (!) 103/54 (BP Location: Right Arm, Patient Position: Sitting, Cuff Size: Normal)   Pulse (!) 58   Resp 14   Ht 5\' 7"  (1.702 m)   Wt 178 lb (80.7 kg)   BMI 27.88 kg/m    Physical Exam  Constitutional: He is oriented to person, place, and time. He appears well-developed and well-nourished.  HENT:  Head: Normocephalic and atraumatic.  Eyes: Pupils are equal, round, and reactive to light. Conjunctivae and EOM are normal.  Neck: Normal range of motion. Neck supple.  Cardiovascular: Normal rate, regular rhythm and normal heart sounds.  Pulmonary/Chest: Effort normal and breath sounds normal.  Abdominal: Soft. Bowel sounds are normal.  Neurological: He is alert and oriented to person, place, and time.  Skin: Skin is warm and dry. Capillary refill takes less than 2 seconds.  Psychiatric: He has a normal mood and affect. His behavior is normal.  Nursing note and vitals reviewed.    Musculoskeletal Exam: C-spine good range of motion.  He has  severe thoracic kyphosis.  Limited range of motion of lumbar spine.  He has some tenderness over bilateral SI joints.  He had good range of motion of his shoulder joints with some discomfort in his left shoulder.  The lateral elbow joints were in good range of motion.  He has limitation of range of motion of bilateral wrist joints.  He has tenderness across MCPs and PIPs bilaterally.  He has some warmth and swelling in his bilateral knee joints.  Left ankle joint was inflamed and swollen.  He has tenderness across MTPs without any synovitis.  CDAI Exam: No CDAI exam completed.   Investigation: Findings:  09/06/17: ANA 1:320 homogeneous, CRP 83.6, TSH 1.17, RF <5, CCP <16, SSA <1, SSB <1   Imaging: Xr Thoracic Spine 2 View  Result Date: 11/15/2017 Thoracic kyphosis was noted.  Lateral osteophytes were noted.  No significant disc space narrowing was noted.  No syndesmophytes were noted. Impression: These findings are  consistent with thoracic kyphosis and multilevel spondylosis.  Xr Foot 2 Views Left  Result Date: 11/15/2017 First MTP, PIP and DIP narrowing was noted.  No other MTP joint narrowing or erosive changes were noted.  No intertarsal joint space narrowing was noted.  No subtalar joint space narrowing was noted. Impression: These findings are consistent with osteoarthritis of the foot.  Xr Foot 2 Views Right  Result Date: 11/15/2017 First MTP, PIP and DIP narrowing was noted.  No other MTP joint narrowing or erosive changes were noted.  No intertarsal joint space narrowing was noted.  No subtalar joint space narrowing was noted. Impression: These findings are consistent with osteoarthritis of the foot.  Xr Hand 2 View Left  Result Date: 11/15/2017 PIP, DIP and CMC narrowing was noted.  No erosive changes were noted.  No intercarpal radiocarpal joint space narrowing was noted. Impression: These findings are consistent with osteoarthritis of the hand.  Xr Hand 2 View Right  Result Date: 11/15/2017 PIP, DIP and CMC narrowing was noted.  No erosive changes were  noted.  No intercarpal radiocarpal joint space narrowing was noted. Impression: These findings are consistent with osteoarthritis of the hand.  Xr Knee 3 View Left  Result Date: 11/15/2017 No significant knee joint narrowing was noted.  No patellofemoral joint narrowing was noted.  No chondrocalcinosis was noted. Impression: Unremarkable x-ray of the knee joint.  Xr Knee 3 View Right  Result Date: 11/15/2017 No significant knee joint narrowing was noted.  No patellofemoral joint narrowing was noted.  No chondrocalcinosis was noted. Impression: Unremarkable x-ray of the knee joint.  Xr Pelvis 1-2 Views  Result Date: 11/15/2017 No SI joint narrowing or sclerosis was noted.   Recent Labs: Lab Results  Component Value Date   WBC 9.8 05/23/2015   HGB 14.6 05/23/2015   PLT 276 05/23/2015   NA 143 05/23/2015   K 3.9 05/23/2015   CL 106  05/23/2015   CO2 29 05/23/2015   GLUCOSE 106 (H) 05/23/2015   BUN 14 05/23/2015   CREATININE 0.79 05/23/2015   BILITOT 0.3 05/23/2015   ALKPHOS 87 05/23/2015   AST 26 05/23/2015   ALT 27 05/23/2015   PROT 7.9 05/23/2015   ALBUMIN 4.1 05/23/2015   CALCIUM 9.9 05/23/2015   GFRAA >60 05/23/2015    Speciality Comments: No specialty comments available.  Procedures:  No procedures performed Allergies: Tetracycline; Nsaids; Bactrim [sulfamethoxazole-trimethoprim]; Prednisone; and Effexor [venlafaxine hcl]   Assessment / Plan:     Visit Diagnoses: Polyarthralgia -he complains of pain and swelling in multiple joints for the last few months.  He has been having difficulty with mobility.  He states prednisone helped but it caused a lot of side effects and he could not tolerate it.  He has been taking Mobic which is been helpful.  His ANA is low titer positive.  I will do a obtain some additional labs today.  At this point I would not add any further medications.  09/06/17: ANA 1:320 homogeneous, CRP 83.6, TSH 1.17, RF <5, CCP <16, SSA <1, SSB <1 - Plan: Uric acid, Sedimentation rate, 14-3-3 eta Protein, Angiotensin converting enzyme  Pain in both hands -he has pain and swelling in his bilateral hands.  Plan: XR Hand 2 View Right, XR Hand 2 View Left.  The x-rays were consistent with osteoarthritis of the bilateral hands.  Chronic SI joint pain - Plan: XR Pelvis 1-2 Views.  The x-ray was unremarkable.  Chronic pain of both knees -he is pain and warmth in his bilateral knee joints.  plan: XR KNEE 3 VIEW RIGHT, XR KNEE 3 VIEW LEFT.  Knee joint x-rays were unremarkable.  Pain in both feet -he has pain and swelling over his left ankle and discomfort in his feet.  Plan: XR Foot 2 Views Right, XR Foot 2 Views Left.  X-ray of bilateral feet were consistent with osteoarthritis.  Pain in thoracic spine -he has severe thoracic kyphosis and discomfort in his thoracic spine.  Plan: XR Thoracic Spine 2 View.   The x-ray showed thoracic kyphosis and multilevel spondylosis.  Other kyphosis of thoracic region  Positive ANA (antinuclear antibody) - Plan: Anti-scleroderma antibody, RNP Antibody, Anti-Smith antibody, Anti-DNA antibody, double-stranded, C3 and C4  High risk medication use - Plan: Hepatitis B core antibody, IgM, Hepatitis B surface antigen, Hepatitis C antibody, QuantiFERON-TB Gold Plus, HIV antibody, Serum protein electrophoresis with reflex, IgG, IgA, IgM, Glucose 6 phosphate dehydrogenase  History of COPD  Anxiety and depression  History of BPH  History of gastroesophageal reflux (GERD)  Hx of migraines  Fungal ear infection - Extensive fungal infection of inner ears, seen by ENT in the past  Family history of psoriasis in mother - Also in maternal aunt and cousins on maternal side  Other fatigue - Plan: CBC with Differential/Platelet, COMPLETE METABOLIC PANEL WITH GFR, Urinalysis, Routine w reflex microscopic, CK, TSH   Orders: Orders Placed This Encounter  Procedures  . XR Hand 2 View Right  . XR Hand 2 View Left  . XR KNEE 3 VIEW RIGHT  . XR KNEE 3 VIEW LEFT  . XR Foot 2 Views Right  . XR Foot 2 Views Left  . XR Pelvis 1-2 Views  . XR Thoracic Spine 2 View  . CBC with Differential/Platelet  . COMPLETE METABOLIC PANEL WITH GFR  . Urinalysis, Routine w reflex microscopic  . CK  . TSH  . Uric acid  . Sedimentation rate  . 14-3-3 eta Protein  . Anti-scleroderma antibody  . RNP Antibody  . Anti-Smith antibody  . Anti-DNA antibody, double-stranded  . C3 and C4  . Angiotensin converting enzyme  . Hepatitis B core antibody, IgM  . Hepatitis B surface antigen  . Hepatitis C antibody  . QuantiFERON-TB Gold Plus  . HIV antibody  . Serum protein electrophoresis with reflex  . IgG, IgA, IgM  . Glucose 6 phosphate dehydrogenase   No orders of the defined types were placed in this encounter.   Face-to-face time spent with patient was 60 minutes. Greater than 50%  of time was spent in counseling and coordination of care.  Follow-Up Instructions: Return for Inflammatory arthritis.   Bo Merino, MD  Note - This record has been created using Editor, commissioning.  Chart creation errors have been sought, but may not always  have been located. Such creation errors do not reflect on  the standard of medical care.

## 2017-11-15 ENCOUNTER — Ambulatory Visit (INDEPENDENT_AMBULATORY_CARE_PROVIDER_SITE_OTHER): Payer: Medicare Other

## 2017-11-15 ENCOUNTER — Ambulatory Visit (INDEPENDENT_AMBULATORY_CARE_PROVIDER_SITE_OTHER): Payer: Medicare Other | Admitting: Rheumatology

## 2017-11-15 ENCOUNTER — Ambulatory Visit (INDEPENDENT_AMBULATORY_CARE_PROVIDER_SITE_OTHER): Payer: Self-pay

## 2017-11-15 ENCOUNTER — Encounter: Payer: Self-pay | Admitting: Rheumatology

## 2017-11-15 VITALS — BP 103/54 | HR 58 | Resp 14 | Ht 67.0 in | Wt 178.0 lb

## 2017-11-15 DIAGNOSIS — M79671 Pain in right foot: Secondary | ICD-10-CM

## 2017-11-15 DIAGNOSIS — G8929 Other chronic pain: Secondary | ICD-10-CM

## 2017-11-15 DIAGNOSIS — M79641 Pain in right hand: Secondary | ICD-10-CM | POA: Diagnosis not present

## 2017-11-15 DIAGNOSIS — M25562 Pain in left knee: Secondary | ICD-10-CM | POA: Diagnosis not present

## 2017-11-15 DIAGNOSIS — M79672 Pain in left foot: Secondary | ICD-10-CM

## 2017-11-15 DIAGNOSIS — M79642 Pain in left hand: Secondary | ICD-10-CM | POA: Diagnosis not present

## 2017-11-15 DIAGNOSIS — M533 Sacrococcygeal disorders, not elsewhere classified: Secondary | ICD-10-CM | POA: Diagnosis not present

## 2017-11-15 DIAGNOSIS — R768 Other specified abnormal immunological findings in serum: Secondary | ICD-10-CM

## 2017-11-15 DIAGNOSIS — F419 Anxiety disorder, unspecified: Secondary | ICD-10-CM

## 2017-11-15 DIAGNOSIS — H6240 Otitis externa in other diseases classified elsewhere, unspecified ear: Secondary | ICD-10-CM

## 2017-11-15 DIAGNOSIS — M546 Pain in thoracic spine: Secondary | ICD-10-CM

## 2017-11-15 DIAGNOSIS — Z87438 Personal history of other diseases of male genital organs: Secondary | ICD-10-CM

## 2017-11-15 DIAGNOSIS — Z8669 Personal history of other diseases of the nervous system and sense organs: Secondary | ICD-10-CM

## 2017-11-15 DIAGNOSIS — Z8719 Personal history of other diseases of the digestive system: Secondary | ICD-10-CM

## 2017-11-15 DIAGNOSIS — F329 Major depressive disorder, single episode, unspecified: Secondary | ICD-10-CM

## 2017-11-15 DIAGNOSIS — F32A Anxiety disorder, unspecified: Secondary | ICD-10-CM

## 2017-11-15 DIAGNOSIS — M40294 Other kyphosis, thoracic region: Secondary | ICD-10-CM

## 2017-11-15 DIAGNOSIS — M25561 Pain in right knee: Secondary | ICD-10-CM

## 2017-11-15 DIAGNOSIS — B369 Superficial mycosis, unspecified: Secondary | ICD-10-CM

## 2017-11-15 DIAGNOSIS — Z840 Family history of diseases of the skin and subcutaneous tissue: Secondary | ICD-10-CM

## 2017-11-15 DIAGNOSIS — M2550 Pain in unspecified joint: Secondary | ICD-10-CM

## 2017-11-15 DIAGNOSIS — Z79899 Other long term (current) drug therapy: Secondary | ICD-10-CM

## 2017-11-15 DIAGNOSIS — R5383 Other fatigue: Secondary | ICD-10-CM

## 2017-11-15 DIAGNOSIS — Z8709 Personal history of other diseases of the respiratory system: Secondary | ICD-10-CM

## 2017-11-19 LAB — COMPLETE METABOLIC PANEL WITH GFR
AG Ratio: 1.2 (calc) (ref 1.0–2.5)
ALT: 9 U/L (ref 9–46)
AST: 15 U/L (ref 10–35)
Albumin: 4 g/dL (ref 3.6–5.1)
Alkaline phosphatase (APISO): 126 U/L — ABNORMAL HIGH (ref 40–115)
BUN: 16 mg/dL (ref 7–25)
CO2: 30 mmol/L (ref 20–32)
Calcium: 9.5 mg/dL (ref 8.6–10.3)
Chloride: 103 mmol/L (ref 98–110)
Creat: 1.19 mg/dL (ref 0.70–1.25)
GFR, Est African American: 75 mL/min/{1.73_m2} (ref >=60)
GFR, Est Non African American: 65 mL/min/{1.73_m2} (ref >=60)
Globulin: 3.4 g/dL (calc) (ref 1.9–3.7)
Glucose, Bld: 76 mg/dL (ref 65–99)
Potassium: 4.6 mmol/L (ref 3.5–5.3)
Sodium: 139 mmol/L (ref 135–146)
Total Bilirubin: 0.2 mg/dL (ref 0.2–1.2)
Total Protein: 7.4 g/dL (ref 6.1–8.1)

## 2017-11-19 LAB — QUANTIFERON-TB GOLD PLUS
Mitogen-NIL: >10 IU/mL
NIL: 0.07 IU/mL
QuantiFERON-TB Gold Plus: NEGATIVE
TB1-NIL: 0.03 IU/mL
TB2-NIL: 0.08 IU/mL

## 2017-11-19 LAB — CBC WITH DIFFERENTIAL/PLATELET
Basophils Absolute: 78 cells/uL (ref 0–200)
Basophils Relative: 1.1 %
Eosinophils Absolute: 511 cells/uL — ABNORMAL HIGH (ref 15–500)
Eosinophils Relative: 7.2 %
HCT: 37.2 % — ABNORMAL LOW (ref 38.5–50.0)
Hemoglobin: 12.6 g/dL — ABNORMAL LOW (ref 13.2–17.1)
Lymphs Abs: 2095 cells/uL (ref 850–3900)
MCH: 29 pg (ref 27.0–33.0)
MCHC: 33.9 g/dL (ref 32.0–36.0)
MCV: 85.7 fL (ref 80.0–100.0)
MPV: 11.3 fL (ref 7.5–12.5)
Monocytes Relative: 10.8 %
Neutro Abs: 3649 cells/uL (ref 1500–7800)
Neutrophils Relative %: 51.4 %
Platelets: 292 10*3/uL (ref 140–400)
RBC: 4.34 10*6/uL (ref 4.20–5.80)
RDW: 15.7 % — ABNORMAL HIGH (ref 11.0–15.0)
Total Lymphocyte: 29.5 %
WBC mixed population: 767 cells/uL (ref 200–950)
WBC: 7.1 10*3/uL (ref 3.8–10.8)

## 2017-11-19 LAB — IGG, IGA, IGM
IgG (Immunoglobin G), Serum: 1826 mg/dL — ABNORMAL HIGH (ref 600–1540)
IgM, Serum: 32 mg/dL — ABNORMAL LOW (ref 50–300)
Immunoglobulin A: 353 mg/dL — ABNORMAL HIGH (ref 20–320)

## 2017-11-19 LAB — PROTEIN ELECTROPHORESIS, SERUM, WITH REFLEX
Albumin ELP: 3.9 g/dL (ref 3.8–4.8)
Alpha 1: 0.3 g/dL (ref 0.2–0.3)
Alpha 2: 0.8 g/dL (ref 0.5–0.9)
Beta 2: 0.4 g/dL (ref 0.2–0.5)
Beta Globulin: 0.5 g/dL (ref 0.4–0.6)
Gamma Globulin: 1.5 g/dL (ref 0.8–1.7)
Total Protein: 7.4 g/dL (ref 6.1–8.1)

## 2017-11-19 LAB — ANTI-SMITH ANTIBODY: ENA SM Ab Ser-aCnc: <1 AI

## 2017-11-19 LAB — C3 AND C4
C3 Complement: 113 mg/dL (ref 82–185)
C4 Complement: 15 mg/dL (ref 15–53)

## 2017-11-19 LAB — ANGIOTENSIN CONVERTING ENZYME: Angiotensin-Converting Enzyme: 51 U/L (ref 9–67)

## 2017-11-19 LAB — URINALYSIS, ROUTINE W REFLEX MICROSCOPIC
Bilirubin Urine: NEGATIVE
Glucose, UA: NEGATIVE
Hgb urine dipstick: NEGATIVE
Ketones, ur: NEGATIVE
Leukocytes, UA: NEGATIVE
Nitrite: NEGATIVE
Protein, ur: NEGATIVE
Specific Gravity, Urine: 1.009 (ref 1.001–1.03)
pH: 5.5 (ref 5.0–8.0)

## 2017-11-19 LAB — HIV ANTIBODY (ROUTINE TESTING W REFLEX): HIV 1&2 Ab, 4th Generation: NONREACTIVE

## 2017-11-19 LAB — 14-3-3 ETA PROTEIN: 14-3-3 eta Protein: <0.2 ng/mL (ref <0.2)

## 2017-11-19 LAB — GLUCOSE 6 PHOSPHATE DEHYDROGENASE: G-6PDH: 17.3 U/g Hgb (ref 7.0–20.5)

## 2017-11-19 LAB — HEPATITIS B SURFACE ANTIGEN: Hepatitis B Surface Ag: NONREACTIVE

## 2017-11-19 LAB — ANTI-DNA ANTIBODY, DOUBLE-STRANDED: ds DNA Ab: 5 IU/mL — ABNORMAL HIGH

## 2017-11-19 LAB — TSH: TSH: 1.56 mIU/L (ref 0.40–4.50)

## 2017-11-19 LAB — CK: Total CK: 70 U/L (ref 44–196)

## 2017-11-19 LAB — SEDIMENTATION RATE: Sed Rate: 25 mm/h — ABNORMAL HIGH (ref 0–20)

## 2017-11-19 LAB — ANTI-SCLERODERMA ANTIBODY: Scleroderma (Scl-70) (ENA) Antibody, IgG: <1 AI

## 2017-11-19 LAB — HEPATITIS C ANTIBODY
Hepatitis C Ab: NONREACTIVE
SIGNAL TO CUT-OFF: 0.14 (ref <1.00)

## 2017-11-19 LAB — HEPATITIS B CORE ANTIBODY, IGM: Hep B C IgM: NONREACTIVE

## 2017-11-19 LAB — IFE INTERPRETATION: Immunofix Electr Int: NOT DETECTED

## 2017-11-19 LAB — RNP ANTIBODY: Ribonucleic Protein(ENA) Antibody, IgG: <1 AI

## 2017-11-19 LAB — URIC ACID: Uric Acid, Serum: 5.2 mg/dL (ref 4.0–8.0)

## 2017-11-19 NOTE — Progress Notes (Signed)
I will discuss results at the follow-up visit.

## 2017-11-20 ENCOUNTER — Encounter: Payer: Self-pay | Admitting: Rheumatology

## 2017-12-04 ENCOUNTER — Encounter: Payer: Self-pay | Admitting: Rheumatology

## 2017-12-04 DIAGNOSIS — R899 Unspecified abnormal finding in specimens from other organs, systems and tissues: Secondary | ICD-10-CM

## 2017-12-05 NOTE — Telephone Encounter (Signed)
I could not reach patient.  I will discuss the immunologic globulin levels with him at follow-up visit.  If he is concerned I can refer him to hematology for evaluation.

## 2017-12-10 ENCOUNTER — Telehealth: Payer: Self-pay | Admitting: Internal Medicine

## 2017-12-10 ENCOUNTER — Encounter: Payer: Self-pay | Admitting: Internal Medicine

## 2017-12-10 NOTE — Telephone Encounter (Signed)
New hematology referral received from Dr. Estanislado Pandy. Pt has been scheduled to see Dr. Julien Nordmann on 9/10 at 1130am. Pt aware to arrive 30 minutes early. Letter mailed.

## 2017-12-13 DIAGNOSIS — Z8719 Personal history of other diseases of the digestive system: Secondary | ICD-10-CM | POA: Insufficient documentation

## 2017-12-13 DIAGNOSIS — F32A Anxiety disorder, unspecified: Secondary | ICD-10-CM | POA: Insufficient documentation

## 2017-12-13 DIAGNOSIS — M19071 Primary osteoarthritis, right ankle and foot: Secondary | ICD-10-CM | POA: Insufficient documentation

## 2017-12-13 DIAGNOSIS — M19072 Primary osteoarthritis, left ankle and foot: Secondary | ICD-10-CM | POA: Insufficient documentation

## 2017-12-13 DIAGNOSIS — Z840 Family history of diseases of the skin and subcutaneous tissue: Secondary | ICD-10-CM | POA: Insufficient documentation

## 2017-12-13 DIAGNOSIS — Z87438 Personal history of other diseases of male genital organs: Secondary | ICD-10-CM | POA: Insufficient documentation

## 2017-12-13 DIAGNOSIS — F329 Major depressive disorder, single episode, unspecified: Secondary | ICD-10-CM | POA: Insufficient documentation

## 2017-12-13 DIAGNOSIS — F419 Anxiety disorder, unspecified: Secondary | ICD-10-CM | POA: Insufficient documentation

## 2017-12-13 NOTE — Progress Notes (Signed)
Office Visit Note  Patient: Kyle Walters             Date of Birth: September 29, 1955           MRN: [id number]             PCP: Elwyn Reach, MD Referring: Elwyn Reach, MD Visit Date: 12/26/2017 Occupation: '@GUAROCC'$ @  Subjective:  Fatigue.   History of Present Illness: Kyle Walters is a 62 y.o. male with history of arthralgias.  His symptoms started after an episode of fever.  He states the swelling has gone down and he is not having that much discomfort in his joints now.  Although he feels that the arthritis in his hands is getting worse and he has difficulty writing.  He is also noticed that the swelling in his feet has decreased.  He is able to put on his wedding band again.  He denies any history of rash, Raynaud's, oral ulcers or nasal ulcers.  He has been reviewing his labs and is concerned about anemia and increased immunoglobulins.  He has appointment coming up with hematology.  Activities of Daily Living:  Patient reports morning stiffness for 12 hours.   Patient Denies nocturnal pain.  Difficulty dressing/grooming: Denies Difficulty climbing stairs: Reports Difficulty getting out of chair: Denies Difficulty using hands for taps, buttons, cutlery, and/or writing: Reports  Review of Systems  Constitutional: Positive for fatigue. Negative for night sweats.  HENT: Negative for mouth sores, mouth dryness and nose dryness.   Eyes: Negative for redness and dryness.  Respiratory: Negative for shortness of breath and difficulty breathing.   Cardiovascular: Positive for swelling in legs/feet. Negative for chest pain, palpitations, hypertension and irregular heartbeat.  Gastrointestinal: Positive for constipation. Negative for diarrhea.  Endocrine: Negative for increased urination.  Genitourinary: Negative for difficulty urinating.  Musculoskeletal: Positive for arthralgias, joint pain, myalgias, morning stiffness and myalgias. Negative for joint swelling, muscle weakness  and muscle tenderness.  Skin: Negative for color change, rash, hair loss, nodules/bumps, skin tightness, ulcers and sensitivity to sunlight.  Allergic/Immunologic: Negative for susceptible to infections.  Neurological: Positive for numbness. Negative for dizziness, fainting, memory loss, night sweats and weakness ( ).  Hematological: Negative for bruising/bleeding tendency and swollen glands.  Psychiatric/Behavioral: Positive for sleep disturbance. Negative for depressed mood. The patient is not nervous/anxious.     PMFS History:  Patient Active Problem List   Diagnosis Date Noted  . Primary osteoarthritis of both feet 12/13/2017  . History of BPH 12/13/2017  . Family history of psoriasis in mother 12/13/2017  . History of gastroesophageal reflux (GERD) 12/13/2017  . Anxiety and depression 12/13/2017  . COPD (chronic obstructive pulmonary disease) ([place]) 01/29/2013  . CHEST PAIN-PRECORDIAL 11/01/2009  . Primary osteoarthritis of both hands 10/12/2009  . CHEST PAIN 10/12/2009    Past Medical History:  Diagnosis Date  . ADHD (attention deficit hyperactivity disorder)   . Anxiety panic attack  . Arthritis   . Depression   . Emphysema lung ([place])   . Enlarged prostate   . Migraine   . Panic attack     Family History  Problem Relation Age of Onset  . Breast cancer Mother   . Diabetes Mother   . Osteoarthritis Mother   . Bladder Cancer Father   . Diabetes Father   . Stroke Father   . Hypertension Father   . Pancreatic cancer Father    History reviewed. No pertinent surgical history. Social History   Social History Narrative  .  Not on file    Objective: Vital Signs: BP 117/65 (BP Location: Left Arm, Patient Position: Sitting, Cuff Size: Normal)   Pulse 72   Resp 16   Ht '5\' 7"'$  (1.702 m)   Wt 179 lb 9.6 oz (81.5 kg)   BMI 28.13 kg/m    Physical Exam  Constitutional: He is oriented to person, place, and time. He appears well-developed and well-nourished.  HENT:    Head: Normocephalic and atraumatic.  Eyes: Pupils are equal, round, and reactive to light. Conjunctivae and EOM are normal.  Neck: Normal range of motion. Neck supple.  Cardiovascular: Normal rate, regular rhythm and normal heart sounds.  Pulmonary/Chest: Effort normal and breath sounds normal.  Abdominal: Soft. Bowel sounds are normal.  Neurological: He is alert and oriented to person, place, and time.  Skin: Skin is warm and dry. Capillary refill takes less than 2 seconds.  Psychiatric: He has a normal mood and affect. His behavior is normal.  Nursing note and vitals reviewed.    Musculoskeletal Exam: C-spine good range of motion, he has thoracic kyphosis.  He is able to reach his toes without difficulty.  Shoulder joints elbow joints wrist joint MCPs PIPs DIPs were in good range of motion with no synovitis.  Hip joints knee joints ankles MTPs PIPs were in good range of motion with no synovitis.  CDAI Exam: CDAI Score: Not documented Patient Global Assessment: Not documented; Provider Global Assessment: Not documented Swollen: Not documented; Tender: Not documented Joint Exam   Not documented   There is currently no information documented on the homunculus. Go to the Rheumatology activity and complete the homunculus joint exam.  Investigation: No additional findings.  Imaging: No results found.  Recent Labs: Lab Results  Component Value Date   WBC 7.1 11/15/2017   HGB 12.6 (L) 11/15/2017   PLT 292 11/15/2017   NA 139 11/15/2017   K 4.6 11/15/2017   CL 103 11/15/2017   CO2 30 11/15/2017   GLUCOSE 76 11/15/2017   BUN 16 11/15/2017   CREATININE 1.19 11/15/2017   BILITOT 0.2 11/15/2017   ALKPHOS 87 05/23/2015   AST 15 11/15/2017   ALT 9 11/15/2017   PROT 7.4 11/15/2017   PROT 7.4 11/15/2017   ALBUMIN 4.1 05/23/2015   CALCIUM 9.5 11/15/2017   GFRAA 75 11/15/2017   QFTBGOLDPLUS NEGATIVE 11/15/2017  UA-, TB Gold negative, HIV negative, hepatitis B negative, hepatitis  C negative, IFE negative, C3-C4 normal, CK 70, TSH normal, uric acid 5.2, ESR 25, '14 3 3 '$ eta negative, SCL 70-, RNP negative, Smith negative, dsDNA 5, ACE 51, G6PD normal, IgG elevated, IgA elevated, IgM low 09/06/17: ANA 1:320 homogeneous, CRP 83.6, TSH 1.17, RF <5, CCP <16, SSA <1, SSB <1  Speciality Comments: No specialty comments available.  Procedures:  No procedures performed Allergies: Tetracycline; Nsaids; Bactrim [sulfamethoxazole-trimethoprim]; Prednisone; and Effexor [venlafaxine hcl]   Assessment / Plan:     Visit Diagnoses: Primary osteoarthritis of both hands-he has mild osteoarthritis in his hands.  No synovitis was noted today.  He gives history of a stiffness and discomfort in his hands.  I will schedule ultrasound of bilateral hands to evaluate this further.  Primary osteoarthritis of both feet-he complains of discomfort in his bilateral feet.  No synovitis was noted.  Positive ANA (antinuclear antibody) - ANA 1:320, ds 5 ( rest of the ENA -) he has low titer ANA and double-stranded DNA.  He has no clinical features of autoimmune disease.  We had detailed discussion  regarding these labs.  I will repeat AVISE labs in 6 months.  If they are all negative then he may cancel his follow-up appointment.  Elevated IgG and IgA-patient has appointment with hematology coming up.   Anemia, unspecified type-patient is concerned about anemia.  We reviewed his labs 2 years ago when his hemoglobin was normal.  He has appointment coming up with hematology to evaluate this further.  Other medical problems are listed as follows:  Anxiety and depression  History of gastroesophageal reflux (GERD)  History of BPH  Family history of psoriasis in mother   Orders: No orders of the defined types were placed in this encounter.  No orders of the defined types were placed in this encounter.  Follow-Up Instructions: Return in about 6 months (around 06/26/2018) for Osteoarthritis.   Bo Merino, MD  Note - This record has been created using Editor, commissioning.  Chart creation errors have been sought, but may not always  have been located. Such creation errors do not reflect on  the standard of medical care.

## 2017-12-26 ENCOUNTER — Ambulatory Visit (INDEPENDENT_AMBULATORY_CARE_PROVIDER_SITE_OTHER): Payer: Medicare Other | Admitting: Rheumatology

## 2017-12-26 ENCOUNTER — Encounter: Payer: Self-pay | Admitting: Rheumatology

## 2017-12-26 VITALS — BP 117/65 | HR 72 | Resp 16 | Ht 67.0 in | Wt 179.6 lb

## 2017-12-26 DIAGNOSIS — F32A Anxiety disorder, unspecified: Secondary | ICD-10-CM

## 2017-12-26 DIAGNOSIS — D649 Anemia, unspecified: Secondary | ICD-10-CM

## 2017-12-26 DIAGNOSIS — Z840 Family history of diseases of the skin and subcutaneous tissue: Secondary | ICD-10-CM

## 2017-12-26 DIAGNOSIS — M19041 Primary osteoarthritis, right hand: Secondary | ICD-10-CM

## 2017-12-26 DIAGNOSIS — F419 Anxiety disorder, unspecified: Secondary | ICD-10-CM | POA: Diagnosis not present

## 2017-12-26 DIAGNOSIS — M19072 Primary osteoarthritis, left ankle and foot: Secondary | ICD-10-CM

## 2017-12-26 DIAGNOSIS — Z87438 Personal history of other diseases of male genital organs: Secondary | ICD-10-CM

## 2017-12-26 DIAGNOSIS — F329 Major depressive disorder, single episode, unspecified: Secondary | ICD-10-CM

## 2017-12-26 DIAGNOSIS — M19071 Primary osteoarthritis, right ankle and foot: Secondary | ICD-10-CM | POA: Diagnosis not present

## 2017-12-26 DIAGNOSIS — M19042 Primary osteoarthritis, left hand: Secondary | ICD-10-CM | POA: Diagnosis not present

## 2017-12-26 DIAGNOSIS — Z8719 Personal history of other diseases of the digestive system: Secondary | ICD-10-CM

## 2017-12-26 DIAGNOSIS — R768 Other specified abnormal immunological findings in serum: Secondary | ICD-10-CM | POA: Diagnosis not present

## 2017-12-26 DIAGNOSIS — R7689 Other specified abnormal immunological findings in serum: Secondary | ICD-10-CM

## 2017-12-31 ENCOUNTER — Telehealth: Payer: Self-pay

## 2017-12-31 ENCOUNTER — Encounter: Payer: Self-pay | Admitting: Internal Medicine

## 2017-12-31 ENCOUNTER — Inpatient Hospital Stay: Payer: Medicare Other | Attending: Internal Medicine | Admitting: Internal Medicine

## 2017-12-31 ENCOUNTER — Inpatient Hospital Stay: Payer: Medicare Other

## 2017-12-31 VITALS — BP 121/56 | HR 64 | Temp 97.8°F | Resp 16 | Ht 67.0 in | Wt 180.0 lb

## 2017-12-31 DIAGNOSIS — Z87891 Personal history of nicotine dependence: Secondary | ICD-10-CM | POA: Diagnosis not present

## 2017-12-31 DIAGNOSIS — R079 Chest pain, unspecified: Secondary | ICD-10-CM | POA: Insufficient documentation

## 2017-12-31 DIAGNOSIS — Z79899 Other long term (current) drug therapy: Secondary | ICD-10-CM | POA: Diagnosis not present

## 2017-12-31 DIAGNOSIS — J449 Chronic obstructive pulmonary disease, unspecified: Secondary | ICD-10-CM | POA: Insufficient documentation

## 2017-12-31 DIAGNOSIS — D472 Monoclonal gammopathy: Secondary | ICD-10-CM | POA: Diagnosis not present

## 2017-12-31 DIAGNOSIS — M898X9 Other specified disorders of bone, unspecified site: Secondary | ICD-10-CM | POA: Diagnosis not present

## 2017-12-31 DIAGNOSIS — G43909 Migraine, unspecified, not intractable, without status migrainosus: Secondary | ICD-10-CM | POA: Diagnosis not present

## 2017-12-31 DIAGNOSIS — D649 Anemia, unspecified: Secondary | ICD-10-CM | POA: Diagnosis not present

## 2017-12-31 DIAGNOSIS — F410 Panic disorder [episodic paroxysmal anxiety] without agoraphobia: Secondary | ICD-10-CM | POA: Diagnosis not present

## 2017-12-31 DIAGNOSIS — R5383 Other fatigue: Secondary | ICD-10-CM | POA: Diagnosis not present

## 2017-12-31 DIAGNOSIS — F909 Attention-deficit hyperactivity disorder, unspecified type: Secondary | ICD-10-CM | POA: Insufficient documentation

## 2017-12-31 LAB — CMP (CANCER CENTER ONLY)
ALT: 11 U/L (ref 0–44)
AST: 17 U/L (ref 15–41)
Albumin: 3.3 g/dL — ABNORMAL LOW (ref 3.5–5.0)
Alkaline Phosphatase: 137 U/L — ABNORMAL HIGH (ref 38–126)
Anion gap: 9 (ref 5–15)
BUN: 15 mg/dL (ref 8–23)
CO2: 27 mmol/L (ref 22–32)
Calcium: 9.3 mg/dL (ref 8.9–10.3)
Chloride: 104 mmol/L (ref 98–111)
Creatinine: 1.16 mg/dL (ref 0.61–1.24)
GFR, Est AFR Am: >60 mL/min (ref >60)
GFR, Estimated: >60 mL/min (ref >60)
Glucose, Bld: 94 mg/dL (ref 70–99)
Potassium: 4.3 mmol/L (ref 3.5–5.1)
Sodium: 140 mmol/L (ref 135–145)
Total Bilirubin: <0.2 mg/dL — ABNORMAL LOW (ref 0.3–1.2)
Total Protein: 7.7 g/dL (ref 6.5–8.1)

## 2017-12-31 LAB — CBC WITH DIFFERENTIAL (CANCER CENTER ONLY)
Basophils Absolute: 0.1 10*3/uL (ref 0.0–0.1)
Basophils Relative: 1 %
Eosinophils Absolute: 0.3 10*3/uL (ref 0.0–0.5)
Eosinophils Relative: 5 %
HCT: 38.9 % (ref 38.4–49.9)
Hemoglobin: 12.7 g/dL — ABNORMAL LOW (ref 13.0–17.1)
Lymphocytes Relative: 28 %
Lymphs Abs: 1.9 10*3/uL (ref 0.9–3.3)
MCH: 29.4 pg (ref 27.2–33.4)
MCHC: 32.7 g/dL (ref 32.0–36.0)
MCV: 90 fL (ref 79.3–98.0)
Monocytes Absolute: 0.8 10*3/uL (ref 0.1–0.9)
Monocytes Relative: 12 %
Neutro Abs: 3.6 10*3/uL (ref 1.5–6.5)
Neutrophils Relative %: 54 %
Platelet Count: 226 10*3/uL (ref 140–400)
RBC: 4.33 MIL/uL (ref 4.20–5.82)
RDW: 14.7 % — ABNORMAL HIGH (ref 11.0–14.6)
WBC Count: 6.7 10*3/uL (ref 4.0–10.3)

## 2017-12-31 LAB — LACTATE DEHYDROGENASE: LDH: 153 U/L (ref 98–192)

## 2017-12-31 NOTE — Telephone Encounter (Signed)
Printed avs and calender of upcoming appointment. per 9/10 los 

## 2017-12-31 NOTE — Progress Notes (Signed)
Kyle Walters [phone]   [phone]) [phone]  CONSULT NOTE  REFERRING PHYSICIAN: Dr. Bo Merino  REASON FOR CONSULTATION:  62 years old white male with elevated IgG level.  HPI Kyle Walters is a 62 y.o. male with past medical history significant for ADHD, anxiety/depression, COPD, benign prostatic hypertrophy, migraine headache, irritable bowel syndrome, and GERD.  The patient was seen by Dr. Jonelle Sidle, his primary care physician, recently complaining of increasing fatigue and weakness over 4 months duration after treatment for herpes simplex of the upper lips.  He was also complaining of his swelling of his hand and feet as well as ankle and joint pain.  He was referred to Dr. Estanislado Pandy for evaluation.  He had a battery of blood work performed during his visit with her including quantitative immunoglobulin that was performed on 11/15/2017 and that showed elevated IgG level of 1826.  The patient also has a slightly elevated IgA of 353 and low IgM of 32.  She referred him to me today for further evaluation and recommendation regarding this abnormality.  He was also found at that time to have mild anemia with hemoglobin of 12.6. When seen today the patient is feeling much better but he continues to have fatigue and intermittent migraine headache.  He also has shortness of breath with exertion and chest pain secondary to anxiety.  He denied having any cough or hemoptysis.  He denied having any weight loss or night sweats.  He has no nausea, vomiting, diarrhea or constipation. Family history significant for mother with breast cancer, father had bladder and pancreatic cancer. The patient is married and has 1 daughter and 2 stepchildren.  He was accompanied today by his wife Kyle Walters.  He is currently on disability.  He used to teach English at college level.  He has a history of his smoking for over 40 years but quit for 5 years ago.  He has no history of alcohol or drug  abuse.  HPI  Past Medical History:  Diagnosis Date  . ADHD (attention deficit hyperactivity disorder)   . Anxiety panic attack  . Arthritis   . Depression   . Emphysema lung ([place])   . Enlarged prostate   . Migraine   . Panic attack     No past surgical history on file.  Family History  Problem Relation Age of Onset  . Breast cancer Mother   . Diabetes Mother   . Osteoarthritis Mother   . Bladder Cancer Father   . Diabetes Father   . Stroke Father   . Hypertension Father   . Pancreatic cancer Father     Social History Social History   Tobacco Use  . Smoking status: Former Smoker    Packs/day: 1.00    Years: 38.00    Pack years: 38.00    Types: Cigarettes    Last attempt to quit: 05/17/2013    Years since quitting: 4.6  . Smokeless tobacco: Never Used  . Tobacco comment: Also using E-Cig  Substance Use Topics  . Alcohol use: No    Alcohol/week: 0.0 standard drinks  . Drug use: No    Allergies  Allergen Reactions  . Tetracycline Anaphylaxis  . Nsaids Other (See Comments)    Cramping, heartburn,GERD  . Bactrim [Sulfamethoxazole-Trimethoprim] Nausea And Vomiting  . Prednisone     Unable to sleep  . Effexor [Venlafaxine Hcl] Rash    Current Outpatient Medications  Medication Sig Dispense Refill  . albuterol (PROAIR HFA) 108 (  90 Base) MCG/ACT inhaler Inhale 2 puffs into the lungs every 6 (six) hours as needed for wheezing or shortness of breath. 8.5 Inhaler 1  . ALPRAZolam (XANAX) 1 MG tablet Take 1 mg by mouth 4 (four) times daily.     Marland Kitchen b complex vitamins tablet Take 1 tablet by mouth daily.    Marland Kitchen BREO ELLIPTA 100-25 MCG/INH AEPB INHALE 1 PUFF INTO THE LUNGS DAILY. 60 each 5  . Cholecalciferol (VITAMIN D3) 2000 UNITS TABS Take 1 tablet by mouth as needed.     Marland Kitchen co-enzyme Q-10 50 MG capsule Take 50 mg by mouth every evening.     Marland Kitchen doxazosin (CARDURA) 4 MG tablet 1 tablet daily.    Marland Kitchen escitalopram (LEXAPRO) 20 MG tablet Take 20 mg by mouth at bedtime. Take  1/2 table by mouth daily  2  . fish oil-omega-3 fatty acids 1000 MG capsule Take 2 g by mouth daily.    Marland Kitchen gabapentin (NEURONTIN) 400 MG capsule Take 400 mg by mouth 4 (four) times daily.     . lansoprazole (PREVACID) 15 MG capsule Take 15 mg by mouth daily.    . meloxicam (MOBIC) 7.5 MG tablet Take 7.5 mg by mouth 2 (two) times daily.  5  . memantine (NAMENDA) 10 MG tablet Take 10 mg by mouth 2 (two) times daily.  6  . MISC NATURAL PRODUCTS PO Take by mouth daily.    . Multiple Vitamins-Minerals (CENTRUM SILVER ADULT 50+ PO) Take by mouth.    . nicotine (NICODERM CQ - DOSED IN MG/24 HOURS) 21 mg/24hr patch Place 21 mg onto the skin daily.    . Riboflavin (VITAMIN B2 PO) Take 1 tablet by mouth daily.    Marland Kitchen Specialty Vitamins Products (MAGNESIUM, AMINO ACID CHELATE,) 133 MG tablet Take 3 tablets by mouth daily. 750 mg (3 216m tablets)    . SUMAtriptan (IMITREX) 20 MG/ACT nasal spray USE 1 (ONE) SPRAY IN ONE NOSTRIL EVERY TWO HOURS, AS NEEDED  11   No current facility-administered medications for this visit.     Review of Systems  Constitutional: positive for fatigue Eyes: negative Ears, nose, mouth, throat, and face: negative Respiratory: positive for dyspnea on exertion and pleurisy/chest pain Cardiovascular: negative Gastrointestinal: negative Genitourinary:negative Integument/breast: negative Hematologic/lymphatic: negative Musculoskeletal:positive for bone pain Neurological: negative Behavioral/Psych: negative Endocrine: negative Allergic/Immunologic: negative  Physical Exam  RPJK:DTOIZ healthy, no distress, well nourished, well developed and anxious SKIN: skin color, texture, turgor are normal, no rashes or significant lesions HEAD: Normocephalic, No masses, lesions, tenderness or abnormalities EYES: normal, PERRLA, Conjunctiva are pink and non-injected EARS: External ears normal, Canals clear OROPHARYNX:no exudate, no erythema and lips, buccal mucosa, and tongue normal   NECK: supple, no adenopathy, no JVD LYMPH:  no palpable lymphadenopathy, no hepatosplenomegaly LUNGS: clear to auscultation , and palpation HEART: regular rate & rhythm, no murmurs and no gallops ABDOMEN:abdomen soft, non-tender, normal bowel sounds and no masses or organomegaly BACK: Back symmetric, no curvature., No CVA tenderness EXTREMITIES:no joint deformities, effusion, or inflammation, no edema  NEURO: alert & oriented x 3 with fluent speech, no focal motor/sensory deficits  PERFORMANCE STATUS: ECOG 1  LABORATORY DATA: Lab Results  Component Value Date   WBC 7.1 11/15/2017   HGB 12.6 (L) 11/15/2017   HCT 37.2 (L) 11/15/2017   MCV 85.7 11/15/2017   PLT 292 11/15/2017      Chemistry      Component Value Date/Time   NA 139 11/15/2017 0958   K 4.6 11/15/2017 01245  CL 103 11/15/2017 0958   CO2 30 11/15/2017 0958   BUN 16 11/15/2017 0958   CREATININE 1.19 11/15/2017 0958      Component Value Date/Time   CALCIUM 9.5 11/15/2017 0958   ALKPHOS 87 05/23/2015 2341   AST 15 11/15/2017 0958   ALT 9 11/15/2017 0958   BILITOT 0.2 11/15/2017 0958       RADIOGRAPHIC STUDIES: No results found.  ASSESSMENT: This is a very pleasant 62 years old white male presented for evaluation of elevated IgG level suspicious for gammopathy of undetermined significance versus reactive elevation of immunoglobulin secondary to his rheumatologic disorder.  The immunofixation did not show any monoclonal protein.   PLAN: I had a lengthy discussion with the patient and his wife today about his condition. I order several studies to rule out the presence of monoclonal gammopathy or early multiple myeloma.  This including repeat CBC, complaints metabolic panel, LDH, beta-2 microglobulin, quantitative immunoglobulin as well as free kappa and lambda light chain. I will see the patient back for follow-up visit in few weeks for evaluation and discussion of his lab results. If there is any concerning  abnormalities on his blood work, we will consider The patient for a bone marrow biopsy and aspirate as well as a skeletal bone survey if needed. The patient was advised to call immediately if he has any concerning symptoms in the interval. The patient voices understanding of current disease status and treatment options and is in agreement with the current care plan.  All questions were answered. The patient knows to call the clinic with any problems, questions or concerns. We can certainly see the patient much sooner if necessary.  Thank you so much for allowing me to participate in the care of Kyle Walters. I will continue to follow up the patient with you and assist in his care.  I spent 40 minutes counseling the patient face to face. The total time spent in the appointment was 60 minutes.  Disclaimer: This note was dictated with voice recognition software. Similar sounding words can inadvertently be transcribed and may not be corrected upon review.   Eilleen Kempf December 31, 2017, 12:01 PM

## 2018-01-01 LAB — IGG, IGA, IGM
IgA: 332 mg/dL (ref 61–437)
IgG (Immunoglobin G), Serum: 1614 mg/dL — ABNORMAL HIGH (ref 700–1600)
IgM (Immunoglobulin M), Srm: 28 mg/dL (ref 20–172)

## 2018-01-02 LAB — KAPPA/LAMBDA LIGHT CHAINS
Kappa free light chain: 33.7 mg/L — ABNORMAL HIGH (ref 3.3–19.4)
Kappa, lambda light chain ratio: 1.76 — ABNORMAL HIGH (ref 0.26–1.65)
Lambda free light chains: 19.2 mg/L (ref 5.7–26.3)

## 2018-01-02 LAB — BETA 2 MICROGLOBULIN, SERUM: Beta-2 Microglobulin: 2.6 mg/L — ABNORMAL HIGH (ref 0.6–2.4)

## 2018-01-06 ENCOUNTER — Encounter: Payer: Self-pay | Admitting: Internal Medicine

## 2018-01-07 ENCOUNTER — Telehealth: Payer: Self-pay | Admitting: Medical Oncology

## 2018-01-07 NOTE — Telephone Encounter (Signed)
Returned call re pt asking if he should take iron. No answer.

## 2018-01-08 ENCOUNTER — Encounter: Payer: Self-pay | Admitting: Internal Medicine

## 2018-01-10 ENCOUNTER — Other Ambulatory Visit: Payer: Self-pay | Admitting: Emergency Medicine

## 2018-01-22 ENCOUNTER — Ambulatory Visit (INDEPENDENT_AMBULATORY_CARE_PROVIDER_SITE_OTHER): Payer: Self-pay

## 2018-01-22 ENCOUNTER — Ambulatory Visit (INDEPENDENT_AMBULATORY_CARE_PROVIDER_SITE_OTHER): Payer: Medicare Other | Admitting: Rheumatology

## 2018-01-22 DIAGNOSIS — M79641 Pain in right hand: Secondary | ICD-10-CM

## 2018-01-22 DIAGNOSIS — M79642 Pain in left hand: Secondary | ICD-10-CM | POA: Diagnosis not present

## 2018-01-22 NOTE — Progress Notes (Signed)
Kyle Walters OFFICE PROGRESS NOTE  Elwyn Reach, MD [address] [place] [address]  DIAGNOSIS: Elevated IgG level suspicious for gammopathy of undetermined significance versus reactive elevation of immunoglobulin secondary to his rheumatologic disorder.  The immunofixation did not show any monoclonal protein.  PRIOR THERAPY: None  CURRENT THERAPY: Observation  INTERVAL HISTORY: Kyle Walters 62 y.o. male returns for routine follow-up visit accompanied by his wife.  The patient reports that he has ongoing fatigue and weakness but is starting to feel better overall.  He denies fevers and chills.  Denies chest pain, shortness of breath, cough, hemoptysis.  Denies nausea and vomiting.  He reports alternating constipation and diarrhea secondary to IBS.  Denies recent weight loss or night sweats.  He reports ongoing anxiety.  The patient had recent lab work and is here to discuss the results.  MEDICAL HISTORY: Past Medical History:  Diagnosis Date  . ADHD (attention deficit hyperactivity disorder)   . Anxiety panic attack  . Arthritis   . Depression   . Emphysema lung ([place])   . Enlarged prostate   . Migraine   . Panic attack     ALLERGIES:  is allergic to tetracycline; nsaids; bactrim [sulfamethoxazole-trimethoprim]; prednisone; and effexor [venlafaxine hcl].  MEDICATIONS:  Current Outpatient Medications  Medication Sig Dispense Refill  . albuterol (PROAIR HFA) 108 (90 Base) MCG/ACT inhaler Inhale 2 puffs into the lungs every 6 (six) hours as needed for wheezing or shortness of breath. 8.5 Inhaler 1  . ALPRAZolam (XANAX) 1 MG tablet Take 1 mg by mouth 4 (four) times daily.     Marland Kitchen BREO ELLIPTA 100-25 MCG/INH AEPB INHALE 1 PUFF INTO THE LUNGS DAILY. 60 each 5  . Cholecalciferol (VITAMIN D3) 2000 UNITS TABS Take 1 tablet by mouth as needed.     . doxazosin (CARDURA) 4 MG tablet 1 tablet daily.    Marland Kitchen escitalopram (LEXAPRO) 20 MG tablet Take 20 mg by mouth at  bedtime. Take 1/2 table by mouth daily  2  . gabapentin (NEURONTIN) 400 MG capsule Take 400 mg by mouth 4 (four) times daily.     . lansoprazole (PREVACID) 15 MG capsule Take 15 mg by mouth daily.    . meloxicam (MOBIC) 7.5 MG tablet Take 7.5 mg by mouth 2 (two) times daily.  5  . memantine (NAMENDA) 10 MG tablet Take 10 mg by mouth 2 (two) times daily.  6  . MISC NATURAL PRODUCTS PO Take by mouth daily.    . Multiple Vitamins-Minerals (CENTRUM SILVER ADULT 50+ PO) Take by mouth.    . nicotine (NICODERM CQ - DOSED IN MG/24 HOURS) 21 mg/24hr patch Place 21 mg onto the skin daily.    . SUMAtriptan (IMITREX) 20 MG/ACT nasal spray USE 1 (ONE) SPRAY IN ONE NOSTRIL EVERY TWO HOURS, AS NEEDED  11  . b complex vitamins tablet Take 1 tablet by mouth daily.    Marland Kitchen co-enzyme Q-10 50 MG capsule Take 50 mg by mouth every evening.     . Riboflavin (VITAMIN B2 PO) Take 1 tablet by mouth daily.    Marland Kitchen Specialty Vitamins Products (MAGNESIUM, AMINO ACID CHELATE,) 133 MG tablet Take 3 tablets by mouth daily. 750 mg (3 '250mg'$  tablets)     No current facility-administered medications for this visit.     SURGICAL HISTORY: History reviewed. No pertinent surgical history.  REVIEW OF SYSTEMS:   Review of Systems  Constitutional: Negative for appetite change, chills, fever and unexpected weight  change.  Positive for fatigue. HENT:   Negative for mouth sores, nosebleeds, sore throat and trouble swallowing.   Eyes: Negative for eye problems and icterus.  Respiratory: Negative for cough, hemoptysis, shortness of breath and wheezing.   Cardiovascular: Negative for chest pain and leg swelling.  Gastrointestinal: Negative for nausea and vomiting.  He has alternating constipation and diarrhea secondary to IBS. Genitourinary: Negative for bladder incontinence, difficulty urinating, dysuria, frequency and hematuria.   Musculoskeletal: Reports generalized arthralgias. Skin: Negative for itching and rash.  Neurological:  Negative for dizziness, extremity weakness, gait problem, headaches, light-headedness and seizures.  Hematological: Negative for adenopathy. Does not bruise/bleed easily.  Psychiatric/Behavioral: Negative for confusion, depression and sleep disturbance.  Positive for anxiety.   PHYSICAL EXAMINATION:  Blood pressure (!) 121/59, pulse 62, temperature 97.8 F (36.6 C), temperature source Oral, resp. rate 16, height '5\' 7"'$  (1.702 m), weight 181 lb 4.8 oz (82.2 kg), SpO2 95 %.  ECOG PERFORMANCE STATUS: 1 - Symptomatic but completely ambulatory  Physical Exam  Constitutional: Oriented to person, place, and time and well-developed, well-nourished, and in no distress. No distress.  HENT:  Head: Normocephalic and atraumatic.  Mouth/Throat: Oropharynx is clear and moist. No oropharyngeal exudate.  Eyes: Conjunctivae are normal. Right eye exhibits no discharge. Left eye exhibits no discharge. No scleral icterus.  Neck: Normal range of motion. Neck supple.  Cardiovascular: Normal rate, regular rhythm, normal heart sounds and intact distal pulses.   Pulmonary/Chest: Effort normal and breath sounds normal. No respiratory distress. No wheezes. No rales.  Abdominal: Soft. Bowel sounds are normal. Exhibits no distension and no mass. There is no tenderness.  Musculoskeletal: Normal range of motion. Exhibits no edema.  Lymphadenopathy:    No cervical adenopathy.  Neurological: Alert and oriented to person, place, and time. Exhibits normal muscle tone. Gait normal. Coordination normal.  Skin: Skin is warm and dry. No rash noted. Not diaphoretic. No erythema. No pallor.  Psychiatric: Mood, memory and judgment normal.  Vitals reviewed.  LABORATORY DATA: Lab Results  Component Value Date   WBC 6.7 12/31/2017   HGB 12.7 (L) 12/31/2017   HCT 38.9 12/31/2017   MCV 90.0 12/31/2017   PLT 226 12/31/2017      Chemistry      Component Value Date/Time   NA 140 12/31/2017 1327   K 4.3 12/31/2017 1327   CL  104 12/31/2017 1327   CO2 27 12/31/2017 1327   BUN 15 12/31/2017 1327   CREATININE 1.16 12/31/2017 1327   CREATININE 1.19 11/15/2017 0958      Component Value Date/Time   CALCIUM 9.3 12/31/2017 1327   ALKPHOS 137 (H) 12/31/2017 1327   AST 17 12/31/2017 1327   ALT 11 12/31/2017 1327   BILITOT <0.2 (L) 12/31/2017 1327       RADIOGRAPHIC STUDIES:  [place] Extrem Up Bilat Comp  Result Date: 01/22/2018 Ultrasound examination of bilateral hands was performed per EULAR recommendations. Using 12 MHz transducer, grayscale and power Doppler bilateral second, third, and fifth MCP joints and bilateral wrist joints both dorsal and volar aspects were evaluated to look for synovitis or tenosynovitis. The findings were there was no synovitis or tenosynovitis on ultrasound examination. Right median nerve was 0.17 cm squares which was in upper limits of normal and left median nerve was 0.20 cm squares which was more than upper limits of normal. Impression: Ultrasound examination show any synovitis.  Bilateral median nerves were enlarged.  He is currently does not have any symptoms of carpal tunnel syndrome.  ASSESSMENT/PLAN:  This is a very pleasant 62 year old white male presented for evaluation of elevated IgG level suspicious for gammopathy of undetermined significance versus reactive elevation of immunoglobulin secondary to his rheumatologic disorder.  The immunofixation did not show any monoclonal protein. The patient had additional lab work at his last visit including a CBC, CMP, LDH, beta-2 microglobulin, quantitative immunoglobulin, and kappa and lambda light chains.  He is here to discuss these results.  The patient was seen with Dr. Julien Nordmann.  At the discussion with the patient and his wife regarding the lab findings.  We discussed that there were no concerning findings on his lab work.  The patient remains concerned that there could still be an underlying condition.  We discussed options including  proceeding with a bone marrow biopsy versus repeat labs in 6 months with a follow-up visit.  The patient would like to go ahead and proceed with a bone marrow biopsy at this time.  We will get him set up within the next week.  He will follow-up 2 to 3 weeks after the bone marrow biopsy to discuss the results.  The patient was advised to call immediately if he has any concerning symptoms in the interval. The patient voices understanding of current disease status and treatment options and is in agreement with the current care plan.  All questions were answered. The patient knows to call the clinic with any problems, questions or concerns. We can certainly see the patient much sooner if necessary.  No orders of the defined types were placed in this encounter.    Mikey Bussing, DNP, AGPCNP-BC, AOCNP 01/23/18   ADDENDUM: Hematology/Oncology Attending: I had a face-to-face encounter with the patient today.  I recommended his care plan.  This is a very pleasant 62 years old white male who was referred for evaluation of elevated IgG level that was not monoclonal by immunofixation and likely reactive in nature.  The patient had several studies performed recently including quantitative immunoglobulin which showed decrease in the IgG close to the normal range.  He continues to have elevated free kappa light chain.  I had a lengthy discussion with the patient and his wife about his condition.  I discussed with the patient several options for his condition including continuous monitoring and observation with repeat myeloma panel in 6 months versus consideration of an marrow biopsy and aspirate to rule out completely the possibility of myeloproliferative or plasma cell neoplasm.  After the discussion the patient and his wife would like to proceed with the bone marrow biopsy and aspirate.  We will arrange for this to be performed in the next few weeks and the patient will come back for follow-up visit 2 weeks  after the biopsy for evaluation and discussion of the results and recommendation regarding any intervention if needed. He was advised to call immediately if he has any concerning symptoms in the interval. Disclaimer: This note was dictated with voice recognition software. Similar sounding words can inadvertently be transcribed and may be missed upon review. Eilleen Kempf, MD 01/23/18

## 2018-01-23 ENCOUNTER — Telehealth: Payer: Self-pay | Admitting: Oncology

## 2018-01-23 ENCOUNTER — Inpatient Hospital Stay: Payer: Medicare Other | Attending: Internal Medicine | Admitting: Oncology

## 2018-01-23 ENCOUNTER — Encounter: Payer: Self-pay | Admitting: Oncology

## 2018-01-23 VITALS — BP 121/59 | HR 62 | Temp 97.8°F | Resp 16 | Ht 67.0 in | Wt 181.3 lb

## 2018-01-23 DIAGNOSIS — D472 Monoclonal gammopathy: Secondary | ICD-10-CM | POA: Diagnosis not present

## 2018-01-23 DIAGNOSIS — Z79899 Other long term (current) drug therapy: Secondary | ICD-10-CM

## 2018-01-23 DIAGNOSIS — K589 Irritable bowel syndrome without diarrhea: Secondary | ICD-10-CM | POA: Diagnosis not present

## 2018-01-23 DIAGNOSIS — R5383 Other fatigue: Secondary | ICD-10-CM | POA: Insufficient documentation

## 2018-01-23 DIAGNOSIS — F419 Anxiety disorder, unspecified: Secondary | ICD-10-CM | POA: Insufficient documentation

## 2018-01-23 NOTE — Telephone Encounter (Signed)
Scheduled appt per 10/3 los and sch message - left message for patient with appt date and time and sent reminder letter in the mail with appt date and time

## 2018-02-04 ENCOUNTER — Inpatient Hospital Stay: Payer: Medicare Other

## 2018-02-04 ENCOUNTER — Inpatient Hospital Stay (HOSPITAL_BASED_OUTPATIENT_CLINIC_OR_DEPARTMENT_OTHER): Payer: Medicare Other | Admitting: Adult Health

## 2018-02-04 DIAGNOSIS — D472 Monoclonal gammopathy: Secondary | ICD-10-CM | POA: Diagnosis not present

## 2018-02-04 LAB — CBC WITH DIFFERENTIAL (CANCER CENTER ONLY)
Abs Immature Granulocytes: 0.03 10*3/uL (ref 0.00–0.07)
Basophils Absolute: 0.1 10*3/uL (ref 0.0–0.1)
Basophils Relative: 1 %
Eosinophils Absolute: 0.5 10*3/uL (ref 0.0–0.5)
Eosinophils Relative: 7 %
HCT: 37.5 % — ABNORMAL LOW (ref 39.0–52.0)
Hemoglobin: 12.4 g/dL — ABNORMAL LOW (ref 13.0–17.0)
Immature Granulocytes: 0 %
Lymphocytes Relative: 28 %
Lymphs Abs: 2.2 10*3/uL (ref 0.7–4.0)
MCH: 29.5 pg (ref 26.0–34.0)
MCHC: 33.1 g/dL (ref 30.0–36.0)
MCV: 89.1 fL (ref 80.0–100.0)
Monocytes Absolute: 1.3 10*3/uL — ABNORMAL HIGH (ref 0.1–1.0)
Monocytes Relative: 16 %
Neutro Abs: 3.8 10*3/uL (ref 1.7–7.7)
Neutrophils Relative %: 48 %
Platelet Count: 247 10*3/uL (ref 150–400)
RBC: 4.21 MIL/uL — ABNORMAL LOW (ref 4.22–5.81)
RDW: 13 % (ref 11.5–15.5)
WBC Count: 7.9 10*3/uL (ref 4.0–10.5)
nRBC: 0 % (ref 0.0–0.2)

## 2018-02-04 NOTE — Patient Instructions (Signed)
Bone Marrow Aspiration and Bone Marrow Biopsy, Adult, Care After This sheet gives you information about how to care for yourself after your procedure. Your health care provider may also give you more specific instructions. If you have problems or questions, contact your health care provider. What can I expect after the procedure? After the procedure, it is common to have:  Mild pain and tenderness.  Swelling.  Bruising.  Follow these instructions at home:  Take over-the-counter or prescription medicines only as told by your health care provider.  Do not take baths, swim, or use a hot tub until your health care provider approves. Ask if you can take a shower or have a sponge bath.  Follow instructions from your health care provider about how to take care of the puncture site. Make sure you: ? Wash your hands with soap and water before you change your bandage (dressing). If soap and water are not available, use hand sanitizer. ? Change your dressing as told by your health care provider.  Check your puncture siteevery day for signs of infection. Check for: ? More redness, swelling, or pain. ? More fluid or blood. ? Warmth. ? Pus or a bad smell.  Return to your normal activities as told by your health care provider. Ask your health care provider what activities are safe for you.  Do not drive for 24 hours if you were given a medicine to help you relax (sedative).  Keep all follow-up visits as told by your health care provider. This is important. Contact a health care provider if:  You have more redness, swelling, or pain around the puncture site.  You have more fluid or blood coming from the puncture site.  Your puncture site feels warm to the touch.  You have pus or a bad smell coming from the puncture site.  You have a fever.  Your pain is not controlled with medicine. This information is not intended to replace advice given to you by your health care provider. Make sure  you discuss any questions you have with your health care provider. Document Released: 10/27/2004 Document Revised: 10/28/2015 Document Reviewed: 09/21/2015 Elsevier Interactive Patient Education  2018 [employer].

## 2018-02-04 NOTE — Progress Notes (Signed)
INDICATION:MGUS r/o myeloma  Bone Marrow Biopsy and Aspiration Procedure Note   Informed consent was obtained and potential risks including bleeding, infection and pain were reviewed with the patient.  The patient's name, date of birth, identification, consent and allergies were verified prior to the start of procedure and time out was performed.  The left posterior iliac crest was chosen as the site of biopsy.  The skin was prepped with ChloraPrep.   9 cc of 2% lidocaine was used to provide local anaesthesia.   10 cc of bone marrow aspirate was obtained followed by 1cm biopsy.  Pressure was applied to the biopsy site and bandage was placed over the biopsy site. Patient was made to lie on the back for 30 mins prior to discharge.  The procedure was tolerated well. COMPLICATIONS: None BLOOD LOSS: none The patient was discharged home in stable condition with a 1 week follow up to review results.  Patient was provided with post bone marrow biopsy instructions and instructed to call if there was any bleeding or worsening pain.  Specimens sent for flow cytometry, cytogenetics and additional studies.  Signed  C , NP   

## 2018-02-05 ENCOUNTER — Telehealth: Payer: Self-pay | Admitting: Adult Health

## 2018-02-05 NOTE — Telephone Encounter (Signed)
Per 10/15 no los

## 2018-02-19 ENCOUNTER — Encounter (HOSPITAL_COMMUNITY): Payer: Self-pay | Admitting: Internal Medicine

## 2018-02-25 ENCOUNTER — Encounter: Payer: Self-pay | Admitting: Internal Medicine

## 2018-02-25 ENCOUNTER — Inpatient Hospital Stay: Payer: Medicare Other | Attending: Internal Medicine | Admitting: Internal Medicine

## 2018-02-25 DIAGNOSIS — D649 Anemia, unspecified: Secondary | ICD-10-CM | POA: Insufficient documentation

## 2018-02-25 DIAGNOSIS — D890 Polyclonal hypergammaglobulinemia: Secondary | ICD-10-CM

## 2018-02-25 DIAGNOSIS — D892 Hypergammaglobulinemia, unspecified: Secondary | ICD-10-CM

## 2018-02-25 DIAGNOSIS — Z79899 Other long term (current) drug therapy: Secondary | ICD-10-CM | POA: Diagnosis not present

## 2018-02-25 NOTE — Progress Notes (Signed)
[facility] [phone]   [phone]  OFFICE PROGRESS NOTE  Elwyn Reach, MD [address] [place] [address]  DIAGNOSIS: Elevated IgG level suspicious for gammopathy of undetermined significance versus reactive elevation of immunoglobulin secondary to his rheumatologic disorder. The immunofixation did not show any monoclonal protein.  PRIOR THERAPY: None  CURRENT THERAPY: Observation  INTERVAL HISTORY: Kyle Walters 62 y.o. male returns to the clinic today for follow-up visit accompanied by his wife.  The patient is feeling fine today with no concerning complaints.  He denied having any recent chest pain, shortness of breath, cough or hemoptysis.  He denied having any weight loss or night sweats.  He has no nausea, vomiting, diarrhea or constipation.  He continues to have mild fatigue.  The patient had a bone marrow biopsy and aspirate performed recently and he is here for evaluation and discussion of his biopsy results and recommendation regarding his condition.  MEDICAL HISTORY: Past Medical History:  Diagnosis Date  . ADHD (attention deficit hyperactivity disorder)   . Anxiety panic attack  . Arthritis   . Depression   . Emphysema lung ([place])   . Enlarged prostate   . Migraine   . Panic attack     ALLERGIES:  is allergic to tetracycline; nsaids; bactrim [sulfamethoxazole-trimethoprim]; prednisone; and effexor [venlafaxine hcl].  MEDICATIONS:  Current Outpatient Medications  Medication Sig Dispense Refill  . albuterol (PROAIR HFA) 108 (90 Base) MCG/ACT inhaler Inhale 2 puffs into the lungs every 6 (six) hours as needed for wheezing or shortness of breath. 8.5 Inhaler 1  . ALPRAZolam (XANAX) 1 MG tablet Take 1 mg by mouth 4 (four) times daily.     Marland Kitchen b complex vitamins tablet Take 1 tablet by mouth daily.    Marland Kitchen BREO ELLIPTA 100-25 MCG/INH AEPB INHALE 1 PUFF INTO THE LUNGS DAILY. 60 each 5  . Cholecalciferol (VITAMIN  D3) 2000 UNITS TABS Take 1 tablet by mouth as needed.     Marland Kitchen co-enzyme Q-10 50 MG capsule Take 50 mg by mouth every evening.     Marland Kitchen doxazosin (CARDURA) 4 MG tablet 1 tablet daily.    Marland Kitchen escitalopram (LEXAPRO) 20 MG tablet Take 20 mg by mouth at bedtime. Take 1/2 table by mouth daily  2  . gabapentin (NEURONTIN) 400 MG capsule Take 400 mg by mouth 4 (four) times daily.     . lansoprazole (PREVACID) 15 MG capsule Take 15 mg by mouth daily.    . meloxicam (MOBIC) 7.5 MG tablet Take 7.5 mg by mouth 2 (two) times daily.  5  . memantine (NAMENDA) 10 MG tablet Take 10 mg by mouth 2 (two) times daily.  6  . MISC NATURAL PRODUCTS PO Take by mouth daily.    . Multiple Vitamins-Minerals (CENTRUM SILVER ADULT 50+ PO) Take by mouth.    . nicotine (NICODERM CQ - DOSED IN MG/24 HOURS) 21 mg/24hr patch Place 21 mg onto the skin daily.    . Riboflavin (VITAMIN B2 PO) Take 1 tablet by mouth daily.    Marland Kitchen Specialty Vitamins Products (MAGNESIUM, AMINO ACID CHELATE,) 133 MG tablet Take 3 tablets by mouth daily. 750 mg (3 242m tablets)    . SUMAtriptan (IMITREX) 20 MG/ACT nasal spray USE 1 (ONE) SPRAY IN ONE NOSTRIL EVERY TWO HOURS, AS NEEDED  11   No current facility-administered medications for this visit.     SURGICAL HISTORY: No past surgical history on file.  REVIEW  OF SYSTEMS:  A comprehensive review of systems was negative except for: Constitutional: positive for fatigue   PHYSICAL EXAMINATION: General appearance: alert, cooperative and no distress Head: Normocephalic, without obvious abnormality, atraumatic Neck: no adenopathy, no JVD, supple, symmetrical, trachea midline and thyroid not enlarged, symmetric, no tenderness/mass/nodules Lymph nodes: Cervical, supraclavicular, and axillary nodes normal. Resp: clear to auscultation bilaterally Back: symmetric, no curvature. ROM normal. No CVA tenderness. Cardio: normal apical impulse GI: soft, non-tender; bowel sounds normal; no masses,  no  organomegaly Extremities: extremities normal, atraumatic, no cyanosis or edema  ECOG PERFORMANCE STATUS: 1 - Symptomatic but completely ambulatory  Blood pressure (!) 144/78, pulse 70, temperature 97.9 F (36.6 C), temperature source Oral, resp. rate 20, height _0  (1.702 m), weight 182 lb 6.4 oz (82.7 kg), SpO2 93 %.  LABORATORY DATA: Lab Results  Component Value Date   WBC 7.9 02/04/2018   HGB 12.4 (L) 02/04/2018   HCT 37.5 (L) 02/04/2018   MCV 89.1 02/04/2018   PLT 247 02/04/2018      Chemistry      Component Value Date/Time   NA 140 12/31/2017 1327   K 4.3 12/31/2017 1327   CL 104 12/31/2017 1327   CO2 27 12/31/2017 1327   BUN 15 12/31/2017 1327   CREATININE 1.16 12/31/2017 1327   CREATININE 1.19 11/15/2017 0958      Component Value Date/Time   CALCIUM 9.3 12/31/2017 1327   ALKPHOS 137 (H) 12/31/2017 1327   AST 17 12/31/2017 1327   ALT 11 12/31/2017 1327   BILITOT <0.2 (L) 12/31/2017 1327       RADIOGRAPHIC STUDIES: No results found.  ASSESSMENT AND PLAN: This is a very pleasant 62 years old white male who is here for evaluation of paraproteinemia. The patient had a bone marrow biopsy and aspirate performed that showed no monoclonal plasmacytosis.  It showed around 6% plasma cells that were polyclonal in nature. I discussed the biopsy results with the patient and his wife.  I recommended for him to continue on observation and close monitoring by his primary care physician. For the mild anemia the patient is overdue for his colonoscopy and I recommended for him to see his gastroenterologist for consideration of repeat colonoscopy.  He also has a history of bleeding hemorrhoids that may be contributing to his mild anemia. I will see the patient on as-needed basis at this point. He was advised to call if he has any concerning issues. The patient voices understanding of current disease status and treatment options and is in agreement with the current care plan. All  questions were answered. The patient knows to call the clinic with any problems, questions or concerns. We can certainly see the patient much sooner if necessary.  I spent 10 minutes counseling the patient face to face. The total time spent in the appointment was 15 minutes.  Disclaimer: This note was dictated with voice recognition software. Similar sounding words can inadvertently be transcribed and may not be corrected upon review.

## 2018-06-25 ENCOUNTER — Ambulatory Visit: Payer: Medicare Other | Admitting: Emergency Medicine

## 2018-06-26 ENCOUNTER — Ambulatory Visit: Payer: Medicare Other | Admitting: Rheumatology

## 2018-07-02 ENCOUNTER — Ambulatory Visit: Payer: Medicare Other | Admitting: Emergency Medicine

## 2018-07-03 ENCOUNTER — Ambulatory Visit: Payer: Medicare Other | Admitting: Adult Health

## 2018-07-09 ENCOUNTER — Telehealth: Payer: Self-pay | Admitting: Emergency Medicine

## 2018-07-09 MED ORDER — ALBUTEROL SULFATE HFA 108 (90 BASE) MCG/ACT IN AERS: 2.0000 | INHALATION_SPRAY | Freq: Four times a day (QID) | RESPIRATORY_TRACT | 0 refills | Status: DC | PRN

## 2018-07-09 NOTE — Telephone Encounter (Signed)
Spoke with the pt  Due to Covid 19 concerns pt cancelled appt  He prefers to call back once he feels comfortable coming out again  I refilled his albuterol  He will call if needed

## 2018-07-10 ENCOUNTER — Ambulatory Visit: Payer: Medicare Other | Admitting: Emergency Medicine

## 2018-08-08 ENCOUNTER — Other Ambulatory Visit: Payer: Self-pay | Admitting: Emergency Medicine

## 2018-10-13 ENCOUNTER — Other Ambulatory Visit: Payer: Self-pay | Admitting: Emergency Medicine

## 2018-10-14 ENCOUNTER — Telehealth: Payer: Self-pay | Admitting: Emergency Medicine

## 2018-10-14 MED ORDER — BREO ELLIPTA 100-25 MCG/INH IN AEPB: 1.0000 | INHALATION_SPRAY | Freq: Every day | RESPIRATORY_TRACT | 3 refills | Status: DC

## 2018-10-14 NOTE — Telephone Encounter (Signed)
Spoke with patient's wife [name]. She stated that she was calling to check on the status of the Breo refill. Advised her that based on his chart, it appears that was denied since he was due for an appt. Attempted to get him scheduled to see RB but he did not have any openings and they prefer to see RB only. I placed a recall for 3 months. I also send in a refill to last until we can get him in to see RB.   She verbalized understanding. Nothing further needed at time of call.

## 2018-12-08 ENCOUNTER — Ambulatory Visit (INDEPENDENT_AMBULATORY_CARE_PROVIDER_SITE_OTHER): Payer: Medicare Other | Admitting: Emergency Medicine

## 2018-12-08 ENCOUNTER — Encounter: Payer: Self-pay | Admitting: Emergency Medicine

## 2018-12-08 ENCOUNTER — Other Ambulatory Visit: Payer: Self-pay

## 2018-12-08 VITALS — BP 136/80 | HR 82 | Temp 98.4°F | Ht 67.0 in | Wt 189.6 lb

## 2018-12-08 DIAGNOSIS — J449 Chronic obstructive pulmonary disease, unspecified: Secondary | ICD-10-CM | POA: Diagnosis not present

## 2018-12-08 MED ORDER — ALBUTEROL SULFATE HFA 108 (90 BASE) MCG/ACT IN AERS: 2.0000 | INHALATION_SPRAY | Freq: Four times a day (QID) | RESPIRATORY_TRACT | 3 refills | Status: DC | PRN

## 2018-12-08 NOTE — Patient Instructions (Addendum)
Please continue Breo 1 inhalation once daily.  Remember to rinse and gargle after using. Keep your albuterol available to use 2 puffs if needed for shortness of breath, chest tightness, wheezing.  We will refill this today. Get the flu shot in the fall Follow with Dr. Lamonte Sakai in 12 months or sooner if you have any problems.

## 2018-12-08 NOTE — Assessment & Plan Note (Signed)
Please continue Breo 1 inhalation once daily.  Remember to rinse and gargle after using. Keep your albuterol available to use 2 puffs if needed for shortness of breath, chest tightness, wheezing.  We will refill this today. Get the flu shot in the fall Follow with Dr. Lamonte Sakai in 12 months or sooner if you have any problems.
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
## 2019-03-01 ENCOUNTER — Other Ambulatory Visit: Payer: Self-pay | Admitting: Emergency Medicine

## 2019-05-27 DIAGNOSIS — F419 Anxiety disorder, unspecified: Secondary | ICD-10-CM | POA: Insufficient documentation

## 2019-05-27 DIAGNOSIS — F900 Attention-deficit hyperactivity disorder, predominantly inattentive type: Secondary | ICD-10-CM | POA: Insufficient documentation

## 2019-06-19 ENCOUNTER — Other Ambulatory Visit: Payer: Self-pay | Admitting: Emergency Medicine

## 2019-10-30 DIAGNOSIS — G4763 Sleep related bruxism: Secondary | ICD-10-CM | POA: Insufficient documentation

## 2019-10-30 DIAGNOSIS — G4451 Hemicrania continua: Secondary | ICD-10-CM | POA: Insufficient documentation

## 2019-10-30 DIAGNOSIS — D649 Anemia, unspecified: Secondary | ICD-10-CM | POA: Insufficient documentation

## 2020-02-20 ENCOUNTER — Ambulatory Visit: Payer: Medicare Other

## 2020-03-25 ENCOUNTER — Other Ambulatory Visit: Payer: Self-pay | Admitting: Emergency Medicine

## 2020-04-11 ENCOUNTER — Ambulatory Visit: Payer: Medicare Other | Admitting: Emergency Medicine

## 2020-05-18 ENCOUNTER — Ambulatory Visit: Payer: Medicare Other | Admitting: Emergency Medicine

## 2020-05-20 ENCOUNTER — Ambulatory Visit: Payer: Medicare Other | Admitting: Emergency Medicine

## 2020-05-20 ENCOUNTER — Other Ambulatory Visit: Payer: Self-pay | Admitting: Emergency Medicine

## 2020-05-20 NOTE — Telephone Encounter (Signed)
Received faxed request from [facility]  Medication name/strength/dose: Breo 100-64mcg Medication last rx'd: 03/25/2020  Instructions: Inhale 1 puff by mouth every day  Last OV: 12/08/2018 Next OV: had appt scheduled this past week 05/20/2020 but canceled due to being sick. No future appt scheduled at this time  Dr Lamonte Sakai please advise on refill request  Allergies  Allergen Reactions  . Tetracycline Anaphylaxis  . Nsaids Other (See Comments)    Cramping, heartburn,GERD  . Bactrim [Sulfamethoxazole-Trimethoprim] Nausea And Vomiting  . Prednisone     Unable to sleep  . Effexor [Venlafaxine Hcl] Rash   Current Outpatient Medications on File Prior to Visit  Medication Sig Dispense Refill  . albuterol (PROAIR HFA) 108 (90 Base) MCG/ACT inhaler Inhale 2 puffs into the lungs every 6 (six) hours as needed for wheezing or shortness of breath. 8 g 3  . ALPRAZolam (XANAX) 1 MG tablet Take 1 mg by mouth 4 (four) times daily.     Marland Kitchen b complex vitamins tablet Take 1 tablet by mouth daily.    Marland Kitchen BREO ELLIPTA 100-25 MCG/INH AEPB INHALE 1 PUFF BY MOUTH EVERY DAY 60 each 0  . Cholecalciferol (VITAMIN D3) 2000 UNITS TABS Take 1 tablet by mouth as needed.     Marland Kitchen co-enzyme Q-10 50 MG capsule Take 50 mg by mouth every evening.     Marland Kitchen doxazosin (CARDURA) 4 MG tablet 1 tablet daily.    Marland Kitchen escitalopram (LEXAPRO) 20 MG tablet Take 20 mg by mouth at bedtime. Take 1/2 table by mouth daily  2  . ferrous sulfate 325 (65 FE) MG tablet Take by mouth.    . gabapentin (NEURONTIN) 400 MG capsule Take 400 mg by mouth 4 (four) times daily.     . lansoprazole (PREVACID) 15 MG capsule Take 15 mg by mouth daily.    . meloxicam (MOBIC) 7.5 MG tablet Take 7.5 mg by mouth 2 (two) times daily.  5  . memantine (NAMENDA) 10 MG tablet Take 10 mg by mouth 2 (two) times daily.  6  . MISC NATURAL PRODUCTS PO Take by mouth daily.    . Multiple Vitamins-Minerals (CENTRUM SILVER ADULT 50+ PO) Take by mouth.    . nicotine (NICODERM CQ - DOSED IN  MG/24 HOURS) 21 mg/24hr patch Place 21 mg onto the skin daily.    . Riboflavin (VITAMIN B2 PO) Take 1 tablet by mouth daily.    Marland Kitchen Specialty Vitamins Products (MAGNESIUM, AMINO ACID CHELATE,) 133 MG tablet Take 3 tablets by mouth daily. 750 mg (3 250mg  tablets)    . SUMAtriptan (IMITREX) 20 MG/ACT nasal spray USE 1 (ONE) SPRAY IN ONE NOSTRIL EVERY TWO HOURS, AS NEEDED  11   No current facility-administered medications on file prior to visit.

## 2020-05-24 ENCOUNTER — Telehealth: Payer: Self-pay | Admitting: Emergency Medicine

## 2020-05-24 MED ORDER — BREO ELLIPTA 100-25 MCG/INH IN AEPB: 1.0000 | INHALATION_SPRAY | Freq: Every day | RESPIRATORY_TRACT | 0 refills | Status: DC

## 2020-05-24 NOTE — Telephone Encounter (Signed)
ATC patient unable to reach patient refilled to appointment for 05/30/20. Left message letting patient know this and to make sure they ask for more refills at that appointment.   Nothing further needed at this time.

## 2020-05-27 ENCOUNTER — Telehealth: Payer: Self-pay | Admitting: Emergency Medicine

## 2020-05-27 ENCOUNTER — Other Ambulatory Visit: Payer: Self-pay | Admitting: Emergency Medicine

## 2020-05-27 MED ORDER — ALBUTEROL SULFATE HFA 108 (90 BASE) MCG/ACT IN AERS: 2.0000 | INHALATION_SPRAY | Freq: Four times a day (QID) | RESPIRATORY_TRACT | 3 refills | Status: DC | PRN

## 2020-05-27 NOTE — Telephone Encounter (Signed)
Spoke with patient's wife. She stated that he needed a refill on his albuterol. This has been sent to [facility] on [address]. Nothing further needed at time of call.

## 2020-05-30 ENCOUNTER — Ambulatory Visit: Payer: Medicare Other | Admitting: Emergency Medicine

## 2020-06-11 ENCOUNTER — Emergency Department (HOSPITAL_COMMUNITY)
Admission: EM | Admit: 2020-06-11 | Discharge: 2020-06-11 | Disposition: A | Discharge status: Home / Self Care | Payer: Medicare Other | Attending: Emergency Medicine | Admitting: Emergency Medicine

## 2020-06-11 ENCOUNTER — Encounter (HOSPITAL_COMMUNITY): Payer: Self-pay | Admitting: Emergency Medicine

## 2020-06-11 ENCOUNTER — Emergency Department (HOSPITAL_COMMUNITY): Payer: Medicare Other

## 2020-06-11 ENCOUNTER — Other Ambulatory Visit: Payer: Self-pay

## 2020-06-11 DIAGNOSIS — G43109 Migraine with aura, not intractable, without status migrainosus: Secondary | ICD-10-CM | POA: Insufficient documentation

## 2020-06-11 DIAGNOSIS — Z87891 Personal history of nicotine dependence: Secondary | ICD-10-CM | POA: Diagnosis not present

## 2020-06-11 DIAGNOSIS — J449 Chronic obstructive pulmonary disease, unspecified: Secondary | ICD-10-CM | POA: Diagnosis not present

## 2020-06-11 DIAGNOSIS — R519 Headache, unspecified: Secondary | ICD-10-CM | POA: Diagnosis present

## 2020-06-11 DIAGNOSIS — Z7951 Long term (current) use of inhaled steroids: Secondary | ICD-10-CM | POA: Diagnosis not present

## 2020-06-11 MED ORDER — METOCLOPRAMIDE HCL 5 MG/ML IJ SOLN
10.0000 mg | Freq: Once | INTRAMUSCULAR | Status: AC
  Administered 2020-06-11: 10 mg via INTRAVENOUS
  Filled 2020-06-11: qty 2

## 2020-06-11 MED ORDER — KETOROLAC TROMETHAMINE 30 MG/ML IJ SOLN
30.0000 mg | Freq: Once | INTRAMUSCULAR | Status: AC
  Administered 2020-06-11: 30 mg via INTRAVENOUS
  Filled 2020-06-11: qty 1

## 2020-06-11 NOTE — Discharge Instructions (Signed)
You were evaluated in the emergency department today for your severe headache.  Your physical exam, vital signs, and CT scan were very reassuring.  There is no bleeding in your brain.  This is very good news.  It appears that you are having a migraine, which you seem to experience frequently.  You were administered 2 medications in the emergency department today to relieve your migraine pain.  The first is called Reglan (also known as metoclopramide) and the second was called Toradol (also known as ketorolac).  You were administered these and you experienced significant relief in your pain.  Below is the contact information for [facility], the local neurology group.  You may call them to schedule follow-up appointment if you are not satisfied with your current neurologic care.  Return the emergency department if you develop any new sudden severe headaches, blurry vision, double vision that is new for you, numbness, tingling, weakness in your extremities, or any other new severe symptoms.

## 2020-06-11 NOTE — ED Triage Notes (Signed)
Patient reports headache since yesterday. States progressed to the "worst headache of his life today." Hx migraines. Denies blurred vision and weakness.

## 2020-06-11 NOTE — ED Notes (Signed)
Care assumed. Report received from Ellen, RN 

## 2020-06-11 NOTE — ED Provider Notes (Signed)
[facility] DEPT Provider Note   CSN: [id number] Arrival date & time: 06/11/20  1353     History Chief Complaint  Patient presents with  . Headache    Kyle Walters is a 65 y.o. male with history of chronic migraines who presents for concern for worsening of headache this afternoon.  Patient states that his migraine started yesterday afternoon after being exposed to strong scent, and he managed it as normal at home with amlotriptan and ibuprofen.  Additionally he took a Flexeril today, with minimal relief in symptoms.  He states that he is managing his migraine as usual at home this afternoon when at 1:30 PM he felt  a severe worsening of his pain in his right temporal area when he turned his neck.  He endorses history of neck spasms with migraine, however he states that he has never had symptoms as severe as today.  He states that he read a book at one point said if he had the "worst headache of his life" he should present to the emergency department, where she felt was consistent with his presentation today so came to the emergency department.  He states that this time his migraines returned to his baseline migraine symptoms.  He does endorse aura with migraine; he experiences loss of vision in a very small slice in his right visual field.  He denies any blurry vision or visual deficits at this time.  Endorses some nausea, denies vomiting.  Additionally endorses any numbness, tingling, weakness in his extremities.  His wife, Kyle Walters, is at the bedside states that he has been cognitively intact with the exception of one moment of confusion when the pain became significantly worse this afternoon.  She states that he immediately returned to his baseline cognitive status.  Entire history is provided by patient with some collateral information given by his wife at the bedside.  Additionally patient endorses "chronic migraine cycle" since last June; states that he is receiving  Botox injections from his neurologist, however has been displeased with his care at their facility and is scheduled to see a new neurologist in March.   I personally reviewed this patient's medical records.  He has history of BPH, and for this reason he is concerned about taking any medications here in the emergency department for his migraine.  He states that he is concerned about worsening of his BPH with migraine cocktail as he wants to avoid need for catheterization.  Additionally has history of GERD, anxiety, depression, and emphysema.  HPI     Past Medical History:  Diagnosis Date  . ADHD (attention deficit hyperactivity disorder)   . Anxiety panic attack  . Arthritis   . Depression   . Emphysema lung ([place])   . Enlarged prostate   . Migraine   . Panic attack     Patient Active Problem List   Diagnosis Date Noted  . Polyclonal gammopathy determined by serum protein electrophoresis 02/25/2018  . Primary osteoarthritis of both feet 12/13/2017  . History of BPH 12/13/2017  . Family history of psoriasis in mother 12/13/2017  . History of gastroesophageal reflux (GERD) 12/13/2017  . Anxiety and depression 12/13/2017  . COPD (chronic obstructive pulmonary disease) ([place]) 01/29/2013  . CHEST PAIN-PRECORDIAL 11/01/2009  . Primary osteoarthritis of both hands 10/12/2009  . CHEST PAIN 10/12/2009    History reviewed. No pertinent surgical history.     Family History  Problem Relation Age of Onset  . Breast cancer Mother   .  Diabetes Mother   . Osteoarthritis Mother   . Bladder Cancer Father   . Diabetes Father   . Stroke Father   . Hypertension Father   . Pancreatic cancer Father     Social History   Tobacco Use  . Smoking status: Former Smoker    Packs/day: 1.00    Years: 38.00    Pack years: 38.00    Types: Cigarettes    Quit date: 05/17/2013    Years since quitting: 7.0  . Smokeless tobacco: Never Used  . Tobacco comment: Also using E-Cig  Vaping Use  .  Vaping Use: Former  Substance Use Topics  . Alcohol use: No    Alcohol/week: 0.0 standard drinks  . Drug use: No    Home Medications Prior to Admission medications   Medication Sig Start Date End Date Taking? Authorizing Provider  albuterol (PROAIR HFA) 108 (90 Base) MCG/ACT inhaler Inhale 2 puffs into the lungs every 6 (six) hours as needed for wheezing or shortness of breath. 05/27/20   Collene Gobble, MD  ALPRAZolam Duanne Moron) 1 MG tablet Take 1 mg by mouth 4 (four) times daily.     [provider]  b complex vitamins tablet Take 1 tablet by mouth daily.    [provider]  BREO ELLIPTA 100-25 MCG/INH AEPB INHALE 1 PUFF BY MOUTH EVERY DAY 03/25/20   Collene Gobble, MD  Cholecalciferol (VITAMIN D3) 2000 UNITS TABS Take 1 tablet by mouth as needed.     [provider]  co-enzyme Q-10 50 MG capsule Take 50 mg by mouth every evening.     [provider]  doxazosin (CARDURA) 4 MG tablet 1 tablet daily. 06/13/13   [provider]  escitalopram (LEXAPRO) 20 MG tablet Take 20 mg by mouth at bedtime. Take 1/2 table by mouth daily 10/26/14   [provider]  ferrous sulfate 325 (65 FE) MG tablet Take by mouth.    [provider]  fluticasone furoate-vilanterol (BREO ELLIPTA) 100-25 MCG/INH AEPB Inhale 1 puff into the lungs daily. 05/24/20   Collene Gobble, MD  gabapentin (NEURONTIN) 400 MG capsule Take 400 mg by mouth 4 (four) times daily.     [provider]  lansoprazole (PREVACID) 15 MG capsule Take 15 mg by mouth daily.    [provider]  meloxicam (MOBIC) 7.5 MG tablet Take 7.5 mg by mouth 2 (two) times daily. 11/06/17   [provider]  memantine (NAMENDA) 10 MG tablet Take 10 mg by mouth 2 (two) times daily. 10/28/17   [provider]  MISC NATURAL PRODUCTS PO Take by mouth daily.    [provider]  Multiple Vitamins-Minerals (CENTRUM SILVER ADULT 50+ PO) Take by mouth.    [provider]  nicotine (NICODERM CQ - DOSED IN MG/24 HOURS) 21 mg/24hr patch Place 21 mg onto the skin daily.    [provider]  Riboflavin (VITAMIN B2 PO) Take 1 tablet by mouth daily.    [provider]  Specialty Vitamins Products (MAGNESIUM, AMINO ACID CHELATE,) 133 MG tablet Take 3 tablets by mouth daily. 750 mg (3 250mg  tablets)    [provider]  SUMAtriptan (IMITREX) 20 MG/ACT nasal spray USE 1 (ONE) SPRAY IN ONE NOSTRIL EVERY TWO HOURS, AS NEEDED 10/12/17   [provider]  VENTOLIN HFA 108 (90 Base) MCG/ACT inhaler TAKE 2 PUFFS BY MOUTH EVERY 6 HOURS AS NEEDED FOR WHEEZE OR SHORTNESS OF BREATH 05/27/20   Collene Gobble,  MD    Allergies    Tetracycline, Nsaids, Bactrim [sulfamethoxazole-trimethoprim], Prednisone, and Effexor [venlafaxine hcl]  Review of Systems   Review of Systems  Constitutional: Negative.   HENT: Negative.   Eyes: Negative for photophobia, pain and visual disturbance.  Respiratory: Negative.   Cardiovascular: Negative.   Gastrointestinal: Positive for nausea. Negative for abdominal pain and vomiting.  Genitourinary: Negative.   Musculoskeletal: Negative.        Chronic neck spasm  Skin: Negative.   Neurological: Positive for headaches. Negative for dizziness, seizures, syncope, facial asymmetry, weakness, light-headedness and numbness.  Psychiatric/Behavioral: Negative for confusion, decreased concentration and dysphoric mood.    Physical Exam Updated Vital Signs BP (!) 150/84 (BP Location: Left Arm)   Pulse 78   Temp 97.9 F (36.6 C) (Oral)   Resp 18   SpO2 95%   Physical Exam Vitals and nursing note reviewed.  HENT:     Head: Normocephalic and atraumatic.     Right Ear: Hearing normal.     Left Ear: Hearing normal.     Nose: Nose normal.     Mouth/Throat:     Mouth: Mucous membranes are moist.     Pharynx: Oropharynx is clear. Uvula midline. No oropharyngeal exudate or posterior oropharyngeal erythema.     Tonsils:  No tonsillar exudate.  Eyes:     General: Lids are normal. Vision grossly intact.        Right eye: No discharge.        Left eye: No discharge.     Extraocular Movements: Extraocular movements intact.     Conjunctiva/sclera: Conjunctivae normal.     Pupils: Pupils are equal, round, and reactive to light.  Neck:     Trachea: Trachea and phonation normal.  Cardiovascular:     Rate and Rhythm: Normal rate and regular rhythm.     Pulses: Normal pulses.          Radial pulses are 2+ on the right side and 2+ on the left side.       Dorsalis pedis pulses are 2+ on the right side and 2+ on the left side.     Heart sounds: Normal heart sounds. No murmur heard.   Pulmonary:     Effort: Pulmonary effort is normal. Prolonged expiration present. No bradypnea, accessory muscle usage or respiratory distress.     Breath sounds: Normal breath sounds. No wheezing or rales.  Chest:     Chest wall: No deformity, swelling, tenderness or crepitus.  Abdominal:     General: Bowel sounds are normal. There is no distension.     Palpations: Abdomen is soft.     Tenderness: There is no abdominal tenderness.  Musculoskeletal:        General: No deformity.     Cervical back: Neck supple. Spasms and tenderness present. No bony tenderness or crepitus. Muscular tenderness present. No pain with movement or spinous process tenderness.     Thoracic back: Spasms present. No tenderness or bony tenderness.     Lumbar back: Normal.     Right lower leg: No edema.     Left lower leg: No edema.  Lymphadenopathy:     Cervical: No cervical adenopathy.  Skin:    General: Skin is warm and dry.     Capillary Refill: Capillary refill takes less than 2 seconds.  Neurological:     General: No focal deficit present.     Mental Status: He is alert and oriented to person, place, and time.  Mental status is at baseline.     Cranial Nerves: Cranial nerves are intact.     Sensory: Sensation is intact.     Motor: Motor function  is intact.     Coordination: Coordination is intact.     Gait: Gait is intact.  Psychiatric:        Attention and Perception: Attention normal.        Mood and Affect: Mood normal.        Speech: Speech normal.        Behavior: Behavior normal.        Thought Content: Thought content normal.     ED Results / Procedures / Treatments   Labs (all labs ordered are listed, but only abnormal results are displayed) Labs Reviewed - No data to display  EKG None  Radiology CT Head Wo Contrast  Result Date: 06/11/2020 CLINICAL DATA:  65 year old male with acute headache and blurred vision for 3 days. EXAM: CT HEAD WITHOUT CONTRAST TECHNIQUE: Contiguous axial images were obtained from the base of the skull through the vertex without intravenous contrast. COMPARISON:  03/22/2016 CT and prior studies FINDINGS: Brain: No evidence of acute infarction, hemorrhage, hydrocephalus, extra-axial collection or mass lesion/mass effect. Vascular: No hyperdense vessel or unexpected calcification. Skull: Normal. Negative for fracture or focal lesion. Sinuses/Orbits: No acute finding. Other: None. IMPRESSION: Unremarkable noncontrast head CT. Electronically Signed   By: Margarette Canada M.D.   On: 06/11/2020 15:32    Procedures Procedures   Medications Ordered in ED Medications - No data to display  ED Course  I have reviewed the triage vital signs and the nursing notes.  Pertinent labs & imaging results that were available during my care of the patient were reviewed by me and considered in my medical decision making (see chart for details).    MDM Rules/Calculators/A&P                         65 year old male with history of chronic migraines who had acute worsening of his headache this afternoon at 1:30 PM.  He presented to the emergency department due to concern for "worst headache of his life".  At this time patient is back to his migraine baseline.  Differential diagnosis for this patient symptoms  include but not limited to intracranial hemorrhage (subarachnoid hemorrhage or sentinel bleed), CVA, space-occupying lesion, hydrocephalus, cerebral venous thrombosis, hypertensive emergency, meningitis, migraine, tension type headache, or hypertensive headache.  Hypertensive on intake to 150/84.  Vital signs otherwise normal.  Cardiopulmonary exam is normal, abdominal exam is benign.  Neurologic exam is normal, without focal deficit.  CT of the head performed in triage was negative.  Of note CT scan was performed approximately 2 hours after acute worsening of the patient's headache which lasted for approximately 10 minutes.  This is very reassuring against intracranial hemorrhage, given clean scan within 6 hours of symptoms.  At this time feel patient presentation is most consistent with migraine headache given reassuring physical exam, CT scan.  Will proceed with migraine cocktail at this time.   Patient experienced significant relief in his migraine pain after administration of migraine cocktail.  He states his pain is now rated at a 1 or 2/10.  He feels he is ready to go home at this time.  Given reassuring physical exam, vital signs, and CT scan, no further work-up is warranted in the ED at this time.  Suspect patient's symptoms are secondary only to his migraine,  likely with muscular spasm that induced acute worsening of his headache that was short in duration.  Will provide contact information for outpatient neurology, as patient is unsatisfied with his current neurology provider.  [name] voiced understanding of his medical evaluation and treatment plan.  Each of his questions was answered to his expressed satisfaction. Return precautions were given.  Patient is well-appearing, stable, and appropriate for discharge at this time.  This chart was dictated using voice recognition software, Dragon. Despite the best efforts of this provider to proofread and correct errors, errors may still occur which can  change documentation meaning.  Final Clinical Impression(s) / ED Diagnoses Final diagnoses:  None    Rx / DC Orders ED Discharge Orders    None       Aura Dials 06/11/20 2002    Lacretia Leigh, MD 06/14/20 1015

## 2020-06-18 ENCOUNTER — Other Ambulatory Visit: Payer: Self-pay | Admitting: Emergency Medicine

## 2020-07-05 ENCOUNTER — Emergency Department (HOSPITAL_COMMUNITY)
Admission: EM | Admit: 2020-07-05 | Discharge: 2020-07-05 | Disposition: A | Discharge status: Home / Self Care | Payer: Medicare Other | Attending: Emergency Medicine | Admitting: Emergency Medicine

## 2020-07-05 ENCOUNTER — Other Ambulatory Visit: Payer: Self-pay

## 2020-07-05 ENCOUNTER — Encounter (HOSPITAL_COMMUNITY): Payer: Self-pay

## 2020-07-05 DIAGNOSIS — Z87891 Personal history of nicotine dependence: Secondary | ICD-10-CM | POA: Diagnosis not present

## 2020-07-05 DIAGNOSIS — Z7951 Long term (current) use of inhaled steroids: Secondary | ICD-10-CM | POA: Insufficient documentation

## 2020-07-05 DIAGNOSIS — R519 Headache, unspecified: Secondary | ICD-10-CM | POA: Diagnosis present

## 2020-07-05 DIAGNOSIS — G43901 Migraine, unspecified, not intractable, with status migrainosus: Secondary | ICD-10-CM | POA: Insufficient documentation

## 2020-07-05 DIAGNOSIS — J449 Chronic obstructive pulmonary disease, unspecified: Secondary | ICD-10-CM | POA: Diagnosis not present

## 2020-07-05 MED ORDER — KETOROLAC TROMETHAMINE 30 MG/ML IJ SOLN
30.0000 mg | Freq: Once | INTRAMUSCULAR | Status: AC
  Administered 2020-07-05: 30 mg via INTRAVENOUS
  Filled 2020-07-05: qty 1

## 2020-07-05 MED ORDER — METOCLOPRAMIDE HCL 5 MG/ML IJ SOLN
10.0000 mg | Freq: Once | INTRAMUSCULAR | Status: AC
  Administered 2020-07-05: 10 mg via INTRAVENOUS
  Filled 2020-07-05: qty 2

## 2020-07-05 NOTE — ED Triage Notes (Addendum)
Patient c/o migraine x 6 days. patient reports intermittent blurred vision, nausea, and sensitivity light.

## 2020-07-05 NOTE — ED Provider Notes (Signed)
[facility] DEPT Provider Note   CSN: [id number] Arrival date & time: 07/05/20  1638     History Chief Complaint  Patient presents with  . Migraine    Walters Walters is a 65 y.o. male.  HPI Patient presents with headache.  Has had over the last 6 days.  Throbbing.  Typical for his migraines.  It is atypical in that it is lasting 6 days.  No relief with triptan's or NSAIDs at home.  No nausea or vomiting.  Mild blurred vision which is typical for him.  No fevers or chills.  No trauma.  States typical migraine.  Throbbing pain.  Patient states that last time he was here Reglan and Toradol workedTo relieve the pain.    Past Medical History:  Diagnosis Date  . ADHD (attention deficit hyperactivity disorder)   . Anxiety panic attack  . Arthritis   . Depression   . Emphysema lung ([place])   . Enlarged prostate   . Migraine   . Panic attack     Patient Active Problem List   Diagnosis Date Noted  . Polyclonal gammopathy determined by serum protein electrophoresis 02/25/2018  . Primary osteoarthritis of both feet 12/13/2017  . History of BPH 12/13/2017  . Family history of psoriasis in mother 12/13/2017  . History of gastroesophageal reflux (GERD) 12/13/2017  . Anxiety and depression 12/13/2017  . COPD (chronic obstructive pulmonary disease) ([place]) 01/29/2013  . CHEST PAIN-PRECORDIAL 11/01/2009  . Primary osteoarthritis of both hands 10/12/2009  . CHEST PAIN 10/12/2009    History reviewed. No pertinent surgical history.     Family History  Problem Relation Age of Onset  . Breast cancer Mother   . Diabetes Mother   . Osteoarthritis Mother   . Bladder Cancer Father   . Diabetes Father   . Stroke Father   . Hypertension Father   . Pancreatic cancer Father     Social History   Tobacco Use  . Smoking status: Former Smoker    Packs/day: 1.00    Years: 38.00    Pack years: 38.00    Types: Cigarettes    Quit date: 05/17/2013    Years  since quitting: 7.1  . Smokeless tobacco: Never Used  . Tobacco comment: Also using E-Cig  Vaping Use  . Vaping Use: Former  Substance Use Topics  . Alcohol use: No    Alcohol/week: 0.0 standard drinks  . Drug use: No    Home Medications Prior to Admission medications   Medication Sig Start Date End Date Taking? Authorizing Provider  albuterol (PROAIR HFA) 108 (90 Base) MCG/ACT inhaler Inhale 2 puffs into the lungs every 6 (six) hours as needed for wheezing or shortness of breath. 05/27/20   Collene Gobble, MD  ALPRAZolam Duanne Moron) 1 MG tablet Take 1 mg by mouth 4 (four) times daily.     [provider]  b complex vitamins tablet Take 1 tablet by mouth daily.    [provider]  BREO ELLIPTA 100-25 MCG/INH AEPB INHALE 1 PUFF BY MOUTH EVERY DAY 03/25/20   Collene Gobble, MD  Cholecalciferol (VITAMIN D3) 2000 UNITS TABS Take 1 tablet by mouth as needed.     [provider]  co-enzyme Q-10 50 MG capsule Take 50 mg by mouth every evening.     [provider]  doxazosin (CARDURA) 4 MG tablet 1 tablet daily. 06/13/13   [provider]  escitalopram (LEXAPRO) 20 MG tablet Take 20 mg by mouth  at bedtime. Take 1/2 table by mouth daily 10/26/14   [provider]  ferrous sulfate 325 (65 FE) MG tablet Take by mouth.    [provider]  fluticasone furoate-vilanterol (BREO ELLIPTA) 100-25 MCG/INH AEPB Inhale 1 puff into the lungs daily. 05/24/20   Collene Gobble, MD  gabapentin (NEURONTIN) 400 MG capsule Take 400 mg by mouth 4 (four) times daily.     [provider]  lansoprazole (PREVACID) 15 MG capsule Take 15 mg by mouth daily.    [provider]  meloxicam (MOBIC) 7.5 MG tablet Take 7.5 mg by mouth 2 (two) times daily. 11/06/17   [provider]  memantine (NAMENDA) 10 MG tablet Take 10 mg by mouth 2 (two) times daily. 10/28/17   [provider]  MISC NATURAL PRODUCTS PO Take by mouth daily.    [provider]  Multiple Vitamins-Minerals (CENTRUM SILVER ADULT 50+ PO) Take by mouth.    [provider]  nicotine (NICODERM CQ - DOSED IN MG/24 HOURS) 21 mg/24hr patch Place 21 mg onto the skin daily.    [provider]  Riboflavin (VITAMIN B2 PO) Take 1 tablet by mouth daily.    [provider]  Specialty Vitamins Products (MAGNESIUM, AMINO ACID CHELATE,) 133 MG tablet Take 3 tablets by mouth daily. 750 mg (3 250mg  tablets)    [provider]  SUMAtriptan (IMITREX) 20 MG/ACT nasal spray USE 1 (ONE) SPRAY IN ONE NOSTRIL EVERY TWO HOURS, AS NEEDED 10/12/17   [provider]  VENTOLIN HFA 108 (90 Base) MCG/ACT inhaler TAKE 2 PUFFS BY MOUTH EVERY 6 HOURS AS NEEDED FOR WHEEZE OR SHORTNESS OF BREATH 05/27/20   Collene Gobble, MD    Allergies    Tetracycline, Nsaids, Bactrim [sulfamethoxazole-trimethoprim], Prednisone, and Effexor [venlafaxine hcl]  Review of Systems   Review of Systems  Constitutional: Positive for appetite change.  HENT: Negative for congestion.   Eyes: Positive for visual disturbance.  Respiratory: Negative for shortness of breath.   Cardiovascular: Negative for chest pain.  Gastrointestinal: Negative for abdominal pain.  Genitourinary: Negative for flank pain.  Musculoskeletal: Negative for back pain.  Neurological: Positive for headaches.  Psychiatric/Behavioral: Negative for confusion.    Physical Exam Updated Vital Signs BP 132/72   Pulse 63   Temp 98 F (36.7 C) (Oral)   Resp 17   Ht 5\' 7"  (1.702 m)   Wt 90.7 kg   SpO2 95%   BMI 31.32 kg/m   Physical Exam Vitals and nursing note reviewed.  HENT:     Head: Atraumatic.     Right Ear: External ear normal.     Left Ear: External ear normal.  Eyes:     General: No scleral icterus.    Pupils: Pupils are equal, round, and reactive to light.  Cardiovascular:     Rate and Rhythm: Regular rhythm.  Pulmonary:     Effort: No respiratory distress.  Abdominal:      General: There is no distension.  Musculoskeletal:        General: No signs of injury.     Cervical back: Neck supple.  Skin:    General: Skin is warm.     Capillary Refill: Capillary refill takes less than 2 seconds.  Neurological:     Mental Status: He is alert and oriented to person, place, and time.  Psychiatric:        Mood and Affect: Mood normal.     ED Results / Procedures /  Treatments   Labs (all labs ordered are listed, but only abnormal results are displayed) Labs Reviewed - No data to display  EKG None  Radiology No results found.  Procedures Procedures   Medications Ordered in ED Medications  metoCLOPramide (REGLAN) injection 10 mg (10 mg Intravenous Given 07/05/20 1922)  ketorolac (TORADOL) 30 MG/ML injection 30 mg (30 mg Intravenous Given 07/05/20 1921)    ED Course  I have reviewed the triage vital signs and the nursing notes.  Pertinent labs & imaging results that were available during my care of the patient were reviewed by me and considered in my medical decision making (see chart for details).    MDM Rules/Calculators/A&P                          Patient presents with headaches.  History of same.  Does see neurologist for it.  Feels better after treatment.  No new red flags.  Likely patient's typical migraine.  Discharge home with outpatient follow-up.  Final Clinical Impression(s) / ED Diagnoses Final diagnoses:  Migraine with status migrainosus, not intractable, unspecified migraine type    Rx / DC Orders ED Discharge Orders    None       Davonna Belling, MD 07/05/20 2303

## 2020-07-10 ENCOUNTER — Other Ambulatory Visit: Payer: Self-pay | Admitting: Emergency Medicine

## 2020-07-12 ENCOUNTER — Encounter: Payer: Self-pay | Admitting: *Deleted

## 2020-07-13 ENCOUNTER — Ambulatory Visit: Payer: Medicare Other | Admitting: Neurology

## 2020-07-13 ENCOUNTER — Telehealth: Payer: Self-pay | Admitting: Neurology

## 2020-07-13 ENCOUNTER — Other Ambulatory Visit: Payer: Self-pay

## 2020-07-13 ENCOUNTER — Encounter: Payer: Self-pay | Admitting: Neurology

## 2020-07-13 VITALS — BP 133/85 | HR 86 | Ht 67.0 in | Wt 205.0 lb

## 2020-07-13 DIAGNOSIS — G43711 Chronic migraine without aura, intractable, with status migrainosus: Secondary | ICD-10-CM | POA: Diagnosis not present

## 2020-07-13 DIAGNOSIS — F332 Major depressive disorder, recurrent severe without psychotic features: Secondary | ICD-10-CM

## 2020-07-13 NOTE — Progress Notes (Signed)
For headache management pt has tried and failed "just about everything" except for Beta blockers which are contraindicated due to his COPD. He has tried Topamax (fell off a porch when he was on it), Depakote, Nortriptyline (problems urinating), allergic to effexor, Lexapro, Memantine, Emgality, Eletriptan, Sumatriptan (jaw tension), Rizatriptan (jaw tension), Frovatriptan (doesn't work), Electrical engineer (doesn't work).

## 2020-07-13 NOTE — Telephone Encounter (Signed)
We need to change patient for botox to our practice. He can come to me for the botoc. He is currently switching from [facility] to [place].

## 2020-07-13 NOTE — Progress Notes (Signed)
[id number]    Provider:  Dr Jaynee Eagles Requesting Provider: Elwyn Reach, MD Primary Care Provider:  Elwyn Reach, MD  CC:  migraines  HPI:  Kyle Walters is a 65 y.o. male here as requested by Elwyn Reach, MD for headaches.  Past medical history panic disorder, COPD, depression, GERD, severe headaches and migraines.  I reviewed Dr. Leontine Locket notes: Headaches improved in the past but now they are back. Went to [facility] and had a bad experience.  Patient has tried Imitrex and and has other medical problems including ADHD and anxiety disorder and chronic allergies to fragrance, he continues to have multiple medical problems including allergies, abdominal discomfort with severe GERD, recurrent nausea, the last time he had a good meaningful relief of his headaches was when he went to the headache clinic at the [facility].  Patient is here alone and reports he has headaches and migraines, has had migraines since his 19s, they became severe about 9 years ago, he is on Botox and takes Emgality cluster headache dose although he denies having cluster headaches.He saw a neurologist at the [facility], then the next neurologist started working for [employer] and was replaced by nurses. He went through a bad migraine cycle in May 30th of last year, he had trigger point injections, they would not do infusions, he cannot take prednisone.He feels he has lasting side effects from prednisone.He was in the ED and he was given reglan and toradol and his migraine cycle stopped. He has BPH so cannot have compazine or benadryl. We spoke about Tardive Dyskinesias and reglan.Migraines worsened 9 years ago.Smells can trigger severe migraines.Hif wife has autism and extreme anxiety. Finger nail polish always starts a severe migraine.He has developed osmophobia and now many smells "set me off". 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 He is going to try peppermint oil to try and find a masking agent to put on his nose.His migraines causeing a lack of memory, in 2018 he went to the [place] [facility].Bright colors also bother him. He has nausea. He still has headaches almost every day, but this is still a significant improvement over prior, in 2018 he had severe daily migraines, now headaches are mild at least 80% improved in severity. He went in last week for reglan and toradol.He struggles with agoraphobia.  Reviewed notes, labs and imaging from outside physicians, which showed:   Medications tried in the past include Emgality, Ajovy, Aimovig, Depakote, gabapentin, Topamax, memantine, Effexor, nortriptyline, beta-blockers are contraindicated due to COPD, sumatriptan, Nurtec, aspirin, Lexapro, ketorolac injections, Mobic, Reglan injections, rizatriptan, sumatriptan hand, zonisamide.  I reviewed chart patient's been seen at the [facility] emergency room twice in the last several months, on June 11, 2020 he presented with headache and on July 05, 2020 he presented with migraine and headache.  I reviewed these records: In February he stated migraine started after triggered by a strong scent, history of neck spasms with the migraine, severe, with aura of visual loss and a small slice in his right visual field, on a "chronic migraine cycle" since last June receiving Botox injections from his neurologist, received relief after migraine cocktail.  He presented in March with headache for 6 days, throbbing, typical for his migraines no relief with current medications.  Again felt better after migraine cocktail and was discharged home.  I also reviewed [place] headache clinic's notes, history of migraines that started in  his late 85s, progressed to chronic migraines in 2012 with possible cluster headache, comorbid anxiety, depression, PTSD, panic attacks, ADD and COPD.  Appears she tried Ajovy and earlier  this month switch back to [facility] which was effective previously.  Blood work collected December 29, 2019 shows a CMP with BUN 14 and creatinine 1.28 otherwise unremarkable,  CT head 06/11/2020: showed No acute intracranial abnormalities including mass lesion or mass effect, hydrocephalus, extra-axial fluid collection, midline shift, hemorrhage, or acute infarction, large ischemic events (personally reviewed images)     Review of Systems: Patient complains of symptoms per HPI as well as the following symptoms headaches, agoraphobia. Pertinent negatives and positives per HPI. All others negative.   Social History   Socioeconomic History  . Marital status: Married    Spouse name: Not on file  . Number of children: Not on file  . Years of education: Not on file  . Highest education level: Not on file  Occupational History  . Not on file  Tobacco Use  . Smoking status: Former Smoker    Packs/day: 1.00    Years: 38.00    Pack years: 38.00    Types: Cigarettes    Quit date: 05/17/2013    Years since quitting: 7.1  . Smokeless tobacco: Never Used  . Tobacco comment: Also using E-Cig  Vaping Use  . Vaping Use: Former  Substance and Sexual Activity  . Alcohol use: No    Alcohol/week: 0.0 standard drinks  . Drug use: No  . Sexual activity: Not on file  Other Topics Concern  . Not on file  Social History Narrative   Lives at home with wife and stepson    Right handed   Caffeine: 8 oz coke per day    Social Determinants of Health   Financial Resource Strain: Not on file  Food Insecurity: Not on file  Transportation Needs: Not on file  Physical Activity: Not on file  Stress: Not on file  Social Connections: Not on file  Intimate Partner Violence: Not on file    Family History  Problem Relation Age of Onset  . Breast cancer Mother   . Diabetes Mother   . Osteoarthritis Mother   . Bladder Cancer Father   . Diabetes Father   . Stroke Father   . Hypertension Father    . Pancreatic cancer Father   . Migraines Father     Past Medical History:  Diagnosis Date  . ADHD (attention deficit hyperactivity disorder)   . Anxiety panic attack  . Arthritis   . COPD (chronic obstructive pulmonary disease) (HCC)    bronchitis/emphysema  . Depression   . Emphysema lung ([place])   . Enlarged prostate   . GERD (gastroesophageal reflux disease)   . Migraine   . Panic attack     Patient Active Problem List   Diagnosis Date Noted  . Chronic migraine without aura, with intractable migraine, so stated, with status migrainosus 07/14/2020  . Severe episode of recurrent major depressive disorder, without psychotic features ([place]) 07/14/2020  . Polyclonal gammopathy determined by serum protein electrophoresis 02/25/2018  . Primary osteoarthritis of both feet 12/13/2017  . History of BPH 12/13/2017  . Family history of psoriasis in mother 12/13/2017  . History of gastroesophageal reflux (GERD) 12/13/2017  . Anxiety and depression 12/13/2017  . COPD (chronic obstructive pulmonary disease) ([place]) 01/29/2013  . CHEST PAIN-PRECORDIAL 11/01/2009  . Primary osteoarthritis of both hands 10/12/2009  . CHEST PAIN 10/12/2009    Past Surgical  History:  Procedure Laterality Date  . NO PAST SURGERIES      Current Outpatient Medications  Medication Sig Dispense Refill  . albuterol (PROAIR HFA) 108 (90 Base) MCG/ACT inhaler Inhale 2 puffs into the lungs every 6 (six) hours as needed for wheezing or shortness of breath. 8 g 3  . ALPRAZolam (XANAX) 1 MG tablet Take 1 mg by mouth 4 (four) times daily.     Marland Kitchen BREO ELLIPTA 100-25 MCG/INH AEPB INHALE 1 PUFF BY MOUTH EVERY DAY 60 each 0  . Cholecalciferol (VITAMIN D3) 2000 UNITS TABS Take 2 tablets by mouth as needed.    . doxazosin (CARDURA) 4 MG tablet 2 tablets daily.    Marland Kitchen eletriptan (RELPAX) 40 MG tablet Take 40 mg by mouth as needed.    Marland Kitchen escitalopram (LEXAPRO) 20 MG tablet Take 20 mg by mouth at bedtime. Take 1/2 table by mouth  daily  2  . ferrous sulfate 325 (65 FE) MG tablet Take by mouth.    . gabapentin (NEURONTIN) 400 MG capsule Take 400 mg by mouth 4 (four) times daily.     . Galcanezumab-gnlm (EMGALITY, 300 MG DOSE,) 100 MG/ML SOSY Inject 300 mg into the skin every 30 (thirty) days.    . lansoprazole (PREVACID) 15 MG capsule Take 15 mg by mouth daily.    . memantine (NAMENDA) 10 MG tablet Take by mouth. Take 20 mg in the morning and 10 mg in the late afternoon  6  . MISC NATURAL PRODUCTS PO Take by mouth daily. GLIACIN    . Multiple Vitamins-Minerals (CENTRUM SILVER ADULT 50+ PO) Take by mouth.    . nicotine (NICODERM CQ - DOSED IN MG/24 HOURS) 21 mg/24hr patch Place 21 mg onto the skin daily.    . OnabotulinumtoxinA (BOTOX IJ) Inject as directed every 3 (three) months.    . simvastatin (ZOCOR) 20 MG tablet Take 20 mg by mouth at bedtime.    . VENTOLIN HFA 108 (90 Base) MCG/ACT inhaler TAKE 2 PUFFS BY MOUTH EVERY 6 HOURS AS NEEDED FOR WHEEZE OR SHORTNESS OF BREATH 18 each 3   No current facility-administered medications for this visit.    Allergies as of 07/13/2020 - Review Complete 07/13/2020  Allergen Reaction Noted  . Tetracycline Anaphylaxis   . Nsaids Other (See Comments) 01/01/2017  . Bactrim [sulfamethoxazole-trimethoprim] Nausea And Vomiting 08/24/2011  . Prednisone  11/15/2017  . Effexor [venlafaxine hcl] Rash 08/24/2011    Vitals: BP 133/85 (BP Location: Left Arm, Patient Position: Sitting)   Pulse 86   Ht 5\' 7"  (1.702 m)   Wt 205 lb (93 kg)   BMI 32.11 kg/m  Last Weight:  Wt Readings from Last 1 Encounters:  07/13/20 205 lb (93 kg)   Last Height:   Ht Readings from Last 1 Encounters:  07/13/20 5\' 7"  (1.702 m)     Physical exam: Exam: Gen: NAD, conversant, well nourised, well groomed                     CV:  No peripheral edema Eyes: Conjunctivae clear without exudates or hemorrhage  Neuro: Detailed Neurologic Exam  Speech:    Speech is normal; fluent and spontaneous with  normal comprehension.  Cognition:    The patient is oriented to person, place, and time;     recent and remote memory intact;     language fluent;     normal attention, concentration,     fund of knowledge Cranial Nerves:  The pupils are equal, round, and reactive to light. Pupils too small to visualize fundi.. Visual fields appear full. Extraocular movements are intact. Muscle of mastication are normal. The face is symmetric. Hearing intact. Voice is normal.  Coordination:    No dysmetria or ataxia.   Gait:    Normal gait   Motor Observation:    No asymmetry, no atrophy, and no involuntary movements noted. Tone:    Normal muscle tone, no spasticity or cogwheeling visually apparent  Posture:    Posture is normal. normal erect    Strength:    Moving all extremities equally       Assessment/Plan:  65 year old very complicated patient with multiple disorders here for migraines. He has a long history, been to multiple neurologists, has tried most medications. I had a long discussion with him, I am not sure that I have anymore answers for him than previousneurologists. We can transfer his botox therapy here and also try [place] for his intractable migraines and refractory depression. Also try nerivio. Discussed other options, medical devices (he tried cefaly, he knows about VNS stimulator but financially not feasible)  Orders Placed This Encounter  Procedures  . Ambulatory referral to Physical Therapy   No orders of the defined types were placed in this encounter.   Cc: Elwyn Reach, MD,  Elwyn Reach, MD   I spent over 70 minutes of face-to-face and non-face-to-face time with patient on the  1. Chronic migraine without aura, with intractable migraine, so stated, with status migrainosus   2. Severe episode of recurrent major depressive disorder, without psychotic features ([place])    diagnosis.  This included previsit chart review, lab review, study review, order entry,  electronic health record documentation, patient education on the different diagnostic and therapeutic options, counseling and coordination of care, risks and benefits of management, compliance, or risk factor reduction   Sarina Ill, MD  [facility] [address] [place], [place] [address]  Phone [phone] Fax [phone]

## 2020-07-13 NOTE — Telephone Encounter (Signed)
Patient said that his last botox appointment from previous doctor was on June 22, 2020 [facility]

## 2020-07-14 ENCOUNTER — Encounter: Payer: Self-pay | Admitting: Neurology

## 2020-07-14 ENCOUNTER — Telehealth: Payer: Self-pay | Admitting: *Deleted

## 2020-07-14 DIAGNOSIS — F332 Major depressive disorder, recurrent severe without psychotic features: Secondary | ICD-10-CM | POA: Insufficient documentation

## 2020-07-14 DIAGNOSIS — G43711 Chronic migraine without aura, intractable, with status migrainosus: Secondary | ICD-10-CM | POA: Insufficient documentation

## 2020-07-14 NOTE — Telephone Encounter (Signed)
Botox charge sheet completed and is pending MD signature.  Diagnosis code G43.711.  Patient needs to sign consent at his first injection appointment.

## 2020-07-14 NOTE — Telephone Encounter (Signed)
-----   Message from Melvenia Beam, MD sent at 07/14/2020 10:00 AM EDT ----- Regarding: Nerivio Can we order nerivio for him please? thanks

## 2020-07-14 NOTE — Telephone Encounter (Signed)
Completed Nerivio migraine device order form. Pending MD signature then will fax to [facility].

## 2020-07-14 NOTE — Telephone Encounter (Addendum)
nerivio order form signed and faxed along with office note, insurance/demographic information to [facility]. Received a receipt of confirmation.  I also mailed a Nerivio pamphlet to the pt at address on file.

## 2020-07-14 NOTE — Telephone Encounter (Signed)
Botox charge sheet signed by Dr Jaynee Eagles and given to botox coordinator.

## 2020-07-26 ENCOUNTER — Encounter: Payer: Self-pay | Admitting: Emergency Medicine

## 2020-07-26 ENCOUNTER — Ambulatory Visit: Payer: Medicare Other | Admitting: Emergency Medicine

## 2020-07-26 ENCOUNTER — Other Ambulatory Visit: Payer: Self-pay

## 2020-07-26 DIAGNOSIS — J449 Chronic obstructive pulmonary disease, unspecified: Secondary | ICD-10-CM

## 2020-07-26 MED ORDER — BREO ELLIPTA 100-25 MCG/INH IN AEPB: INHALATION_SPRAY | RESPIRATORY_TRACT | 0 refills | Status: DC

## 2020-07-26 NOTE — Addendum Note (Signed)
Addended by: Lia Foyer R on: 07/26/2020 03:23 PM   Modules accepted: Orders

## 2020-07-26 NOTE — Assessment & Plan Note (Signed)
With asthmatic features.  No exacerbations.  Doing well on Breo.  We will plan to continue same.  He rarely needs albuterol but we will make sure he has it available.  COVID-19 vaccine is up-to-date, he will need the second booster.  Follow-up annually  Please continue Breo once daily as you have been using it.  Rinse and gargle after you take it Keep your albuterol available to use 2 puffs if needed for shortness of breath, chest tightness, wheezing. COVID-19 vaccine is up-to-date.  Get the next booster shot as recommended Follow Dr. Lamonte Sakai in 1 year or sooner if you have any problems.  Please call us if your breathing changes

## 2020-07-26 NOTE — Patient Instructions (Signed)
Please continue Breo once daily as you have been using it.  Rinse and gargle after you take it Keep your albuterol available to use 2 puffs if needed for shortness of breath, chest tightness, wheezing. COVID-19 vaccine is up-to-date.  Get the next booster shot as recommended Follow Dr. Lamonte Sakai in 1 year or sooner if you have any problems.  Please call us if your breathing changes

## 2020-07-26 NOTE — Progress Notes (Signed)
  Subjective:    Patient ID: Kyle Walters, male    DOB: [birth year], 65 y.o.   MRN: [id number]  HPI  ROV 12/08/2018 --Kyle Walters has a history of asthmatic COPD, also some superimposed restrictive lung disease.  History of trauma and postconcussion syndrome, depression.  He returns today for follow-up.  He reports that his breathing is for the most part stable. May have a bit more exertional dyspnea. His exercise has still been limited. He rarely uses albuterol. Has been stable on Breo. No flares reported. No wheeze or cough.   ROV 07/26/20 --Kyle 65 year old Walters with a history of asthmatic COPD, superimposed restriction.  He has depression, history of trauma and postconcussion syndrome.  He returns today for regular follow-up.  Today he reports that he is overall doing well. He has gained wt since last time, over 20 lbs.  Remains on Breo, has albuterol that he uses almost never. Remains active, can do most of his usual activities. No cough. COVID shots up to date.      Objective:   Physical Exam Vitals:   07/26/20 1424  BP: 110/74  Pulse: 75  Temp: (!) 97.2 F (36.2 C)  TempSrc: Temporal  SpO2: 95%  Weight: 202 lb (91.6 kg)  Height: 5\' 7"  (1.702 m)    Gen: Kyle, well-nourished, in no distress,  normal affect  ENT: No lesions,  mouth clear,  oropharynx clear, no postnasal drip  Neck: No JVD, no stridor  Lungs: No use of accessory muscles, clear without rales or rhonchi  Cardiovascular: RRR, heart sounds normal, no murmur or gallops, no peripheral edema  Musculoskeletal: No deformities, no cyanosis or clubbing  Neuro: awake, a bit slow to   Skin: Warm, no lesions or rashes     Assessment & Plan:  COPD (chronic obstructive pulmonary disease) With asthmatic features.  No exacerbations.  Doing well on Breo.  We will plan to continue same.  He rarely needs albuterol but we will make sure he has it available.  COVID-19 vaccine is up-to-date, he will need the second  booster.  Follow-up annually  Please continue Breo once daily as you have been using it.  Rinse and gargle after you take it Keep your albuterol available to use 2 puffs if needed for shortness of breath, chest tightness, wheezing. COVID-19 vaccine is up-to-date.  Get the next booster shot as recommended Follow Dr. Lamonte Sakai in 1 year or sooner if you have any problems.  Please call us if your breathing changes  Baltazar Apo, MD, PhD [facility] [phone] or if no answer [phone]

## 2020-07-31 DIAGNOSIS — Z87891 Personal history of nicotine dependence: Secondary | ICD-10-CM

## 2020-08-01 NOTE — Telephone Encounter (Signed)
RB, please advise on pt email, thanks!   Dr. Lamonte Sakai,  I forgot to ask about this at my appointment last Tuesday. As someone between 64 and 5 who smoked an average of a pack and a half of cigarettes for forty years, I assume I should get a low dose CT screening for my lungs. What are your thoughts on it, and do I get the referral from you or my primary care physician?  Kyle Walters

## 2020-08-02 MED ORDER — BOTOX 200 UNITS IJ SOLR: INTRAMUSCULAR | 3 refills | Status: DC

## 2020-08-02 NOTE — Telephone Encounter (Signed)
Received approval via [facility] for Botox. [id number] (exp. 11/01/20). Patient will need to use [facility].

## 2020-08-02 NOTE — Addendum Note (Signed)
Addended by: Gildardo Griffes on: 08/02/2020 09:07 AM   Modules accepted: Orders

## 2020-08-04 NOTE — Telephone Encounter (Signed)
I called [place] and spoke with [facility] to check status. She states Botox TBD 4/21.

## 2020-08-04 NOTE — Telephone Encounter (Signed)
[employer] [id number]. Approved through 11/01/2020.

## 2020-08-05 NOTE — Telephone Encounter (Signed)
He does qualify for Lung Cancer Screening. I would like to refer him since he is interested.

## 2020-08-09 ENCOUNTER — Emergency Department (HOSPITAL_COMMUNITY)
Admission: EM | Admit: 2020-08-09 | Discharge: 2020-08-09 | Disposition: A | Discharge status: Home / Self Care | Payer: Medicare Other | Attending: Emergency Medicine | Admitting: Emergency Medicine

## 2020-08-09 ENCOUNTER — Encounter (HOSPITAL_COMMUNITY): Payer: Self-pay | Admitting: Emergency Medicine

## 2020-08-09 DIAGNOSIS — J449 Chronic obstructive pulmonary disease, unspecified: Secondary | ICD-10-CM | POA: Insufficient documentation

## 2020-08-09 DIAGNOSIS — G43909 Migraine, unspecified, not intractable, without status migrainosus: Secondary | ICD-10-CM | POA: Diagnosis present

## 2020-08-09 DIAGNOSIS — Z87891 Personal history of nicotine dependence: Secondary | ICD-10-CM | POA: Insufficient documentation

## 2020-08-09 DIAGNOSIS — G43001 Migraine without aura, not intractable, with status migrainosus: Secondary | ICD-10-CM | POA: Diagnosis not present

## 2020-08-09 DIAGNOSIS — Z7951 Long term (current) use of inhaled steroids: Secondary | ICD-10-CM | POA: Insufficient documentation

## 2020-08-09 MED ORDER — METOCLOPRAMIDE HCL 5 MG/ML IJ SOLN
10.0000 mg | Freq: Once | INTRAMUSCULAR | Status: AC
  Administered 2020-08-09: 10 mg via INTRAVENOUS
  Filled 2020-08-09: qty 2

## 2020-08-09 MED ORDER — KETOROLAC TROMETHAMINE 30 MG/ML IJ SOLN
30.0000 mg | Freq: Once | INTRAMUSCULAR | Status: AC
  Administered 2020-08-09: 30 mg via INTRAVENOUS
  Filled 2020-08-09: qty 1

## 2020-08-09 NOTE — ED Provider Notes (Signed)
[facility] DEPT Provider Note   CSN: [id number] Arrival date & time: 08/09/20  1524     History Chief Complaint  Patient presents with  . Migraine    Kyle Walters is a 65 y.o. male.  Patient complains of a migraine.  Patient has a history of migraines and when he comes to the hospital Reglan Toradol helps.  The history is provided by the patient and medical records.  Migraine This is a recurrent problem. The current episode started 2 days ago. The problem occurs constantly. The problem has not changed since onset.Pertinent negatives include no chest pain, no abdominal pain and no headaches. Nothing aggravates the symptoms. Nothing relieves the symptoms.       Past Medical History:  Diagnosis Date  . ADHD (attention deficit hyperactivity disorder)   . Anxiety panic attack  . Arthritis   . COPD (chronic obstructive pulmonary disease) (HCC)    bronchitis/emphysema  . Depression   . Emphysema lung ([place])   . Enlarged prostate   . GERD (gastroesophageal reflux disease)   . Migraine   . Panic attack     Patient Active Problem List   Diagnosis Date Noted  . Chronic migraine without aura, with intractable migraine, so stated, with status migrainosus 07/14/2020  . Severe episode of recurrent major depressive disorder, without psychotic features ([place]) 07/14/2020  . Polyclonal gammopathy determined by serum protein electrophoresis 02/25/2018  . Primary osteoarthritis of both feet 12/13/2017  . History of BPH 12/13/2017  . Family history of psoriasis in mother 12/13/2017  . History of gastroesophageal reflux (GERD) 12/13/2017  . Anxiety and depression 12/13/2017  . COPD (chronic obstructive pulmonary disease) ([place]) 01/29/2013  . CHEST PAIN-PRECORDIAL 11/01/2009  . Primary osteoarthritis of both hands 10/12/2009  . CHEST PAIN 10/12/2009    Past Surgical History:  Procedure Laterality Date  . NO PAST SURGERIES         Family History   Problem Relation Age of Onset  . Breast cancer Mother   . Diabetes Mother   . Osteoarthritis Mother   . Bladder Cancer Father   . Diabetes Father   . Stroke Father   . Hypertension Father   . Pancreatic cancer Father   . Migraines Father     Social History   Tobacco Use  . Smoking status: Former Smoker    Packs/day: 1.00    Years: 38.00    Pack years: 38.00    Types: Cigarettes    Quit date: 05/17/2013    Years since quitting: 7.2  . Smokeless tobacco: Never Used  . Tobacco comment: Also using E-Cig  Vaping Use  . Vaping Use: Former  Substance Use Topics  . Alcohol use: No    Alcohol/week: 0.0 standard drinks  . Drug use: No    Home Medications Prior to Admission medications   Medication Sig Start Date End Date Taking? Authorizing Provider  albuterol (PROAIR HFA) 108 (90 Base) MCG/ACT inhaler Inhale 2 puffs into the lungs every 6 (six) hours as needed for wheezing or shortness of breath. 05/27/20   Collene Gobble, MD  ALPRAZolam Duanne Moron) 1 MG tablet Take 1 mg by mouth 4 (four) times daily.     [provider]  Botulinum Toxin Type A (BOTOX) 200 units SOLR Provider to inject 155 units into the muscles of the head and neck every 3 months. Discard remainder. 08/02/20   Melvenia Beam, MD  Cholecalciferol (VITAMIN D3) 2000 UNITS TABS Take 2 tablets by mouth  as needed.    [provider]  doxazosin (CARDURA) 4 MG tablet 2 tablets daily. 06/13/13   [provider]  eletriptan (RELPAX) 40 MG tablet Take 40 mg by mouth as needed.    [provider]  escitalopram (LEXAPRO) 20 MG tablet Take 20 mg by mouth at bedtime. Take 1/2 table by mouth daily 10/26/14   [provider]  ferrous sulfate 325 (65 FE) MG tablet Take by mouth.    [provider]  fluticasone furoate-vilanterol (BREO ELLIPTA) 100-25 MCG/INH AEPB INHALE 1 PUFF BY MOUTH EVERY DAY 07/26/20   Collene Gobble, MD  gabapentin (NEURONTIN) 400 MG capsule Take 400 mg by mouth 4  (four) times daily.     [provider]  Galcanezumab-gnlm (EMGALITY, 300 MG DOSE,) 100 MG/ML SOSY Inject 300 mg into the skin every 30 (thirty) days.    [provider]  lansoprazole (PREVACID) 15 MG capsule Take 15 mg by mouth daily.    [provider]  memantine (NAMENDA) 10 MG tablet Take by mouth. Take 20 mg in the morning and 10 mg in the late afternoon 10/28/17   [provider]  MISC NATURAL PRODUCTS PO Take by mouth daily. GLIACIN    [provider]  Multiple Vitamins-Minerals (CENTRUM SILVER ADULT 50+ PO) Take by mouth.    [provider]  nicotine (NICODERM CQ - DOSED IN MG/24 HOURS) 21 mg/24hr patch Place 21 mg onto the skin daily.    [provider]  OnabotulinumtoxinA (BOTOX IJ) Inject as directed every 3 (three) months.    [provider]  simvastatin (ZOCOR) 20 MG tablet Take 20 mg by mouth at bedtime. 04/14/20   [provider]  VENTOLIN HFA 108 (90 Base) MCG/ACT inhaler TAKE 2 PUFFS BY MOUTH EVERY 6 HOURS AS NEEDED FOR WHEEZE OR SHORTNESS OF BREATH 05/27/20   Collene Gobble, MD    Allergies    Tetracycline, Nsaids, Bactrim [sulfamethoxazole-trimethoprim], Prednisone, and Effexor [venlafaxine hcl]  Review of Systems   Review of Systems  Constitutional: Negative for appetite change and fatigue.  HENT: Negative for congestion, ear discharge and sinus pressure.   Eyes: Negative for discharge.  Respiratory: Negative for cough.   Cardiovascular: Negative for chest pain.  Gastrointestinal: Negative for abdominal pain and diarrhea.  Genitourinary: Negative for frequency and hematuria.  Musculoskeletal: Negative for back pain.  Skin: Negative for rash.  Neurological: Negative for seizures and headaches.  Psychiatric/Behavioral: Negative for hallucinations.    Physical Exam Updated Vital Signs BP 136/71   Pulse 64   Temp 98 F (36.7 C) (Oral)   Resp 17   SpO2 95%   Physical Exam Vitals and  nursing note reviewed.  Constitutional:      Appearance: He is well-developed.  HENT:     Head: Normocephalic.     Mouth/Throat:     Mouth: Mucous membranes are moist.  Eyes:     General: No scleral icterus.    Conjunctiva/sclera: Conjunctivae normal.  Neck:     Thyroid: No thyromegaly.  Cardiovascular:     Rate and Rhythm: Normal rate.  Pulmonary:     Effort: Pulmonary effort is normal.     Breath sounds: No stridor.  Abdominal:     General: There is no distension.  Musculoskeletal:        General: Normal range of motion.     Cervical back: Neck supple.  Lymphadenopathy:     Cervical: No cervical adenopathy.  Skin:  Findings: No erythema or rash.  Neurological:     Mental Status: He is alert and oriented to person, place, and time.     Motor: No abnormal muscle tone.     Coordination: Coordination normal.  Psychiatric:        Behavior: Behavior normal.     ED Results / Procedures / Treatments   Labs (all labs ordered are listed, but only abnormal results are displayed) Labs Reviewed - No data to display  EKG None  Radiology No results found.  Procedures Procedures   Medications Ordered in ED Medications  metoCLOPramide (REGLAN) injection 10 mg (10 mg Intravenous Given 08/09/20 1837)  ketorolac (TORADOL) 30 MG/ML injection 30 mg (30 mg Intravenous Given 08/09/20 1838)    ED Course  I have reviewed the triage vital signs and the nursing notes.  Pertinent labs & imaging results that were available during my care of the patient were reviewed by me and considered in my medical decision making (see chart for details).   Patient was given Reglan and Toradol and his headache improved MDM Rules/Calculators/A&P                          Patient with migraine headache.  Resolved with migraine cocktail Final Clinical Impression(s) / ED Diagnoses Final diagnoses:  Migraine without aura and with status migrainosus, not intractable    Rx / DC Orders ED  Discharge Orders    None       Milton Ferguson, MD 08/09/20 2028

## 2020-08-09 NOTE — ED Notes (Signed)
Pt verbalized understanding of d/c and follow up care. Ambulatory with steady gait.  

## 2020-08-09 NOTE — Discharge Instructions (Addendum)
Follow-up with your doctor as needed for your migraines

## 2020-08-09 NOTE — ED Triage Notes (Signed)
Emergency Medicine Provider Triage Evaluation Note  Kyle Walters , a 65 y.o. male  was evaluated in triage.  Pt complains of migraines, being on for last 8 days, has no associated change in vision, paresthesias or weakness of the lower extremities.  States it feels like his typical migraines.  Had a head CT few months ago for same complaint came back unremarkable..  Review of Systems  Positive: Headaches, photophobia Negative: Denies change in vision, paresthesias or weakness upper lower extremities.  Physical Exam  BP 131/80 (BP Location: Right Arm)   Pulse 73   Temp 98 F (36.7 C) (Oral)   Resp 18   SpO2 94%  Gen:   Awake, no distress   HEENT:  Atraumatic  Resp:  Normal effort  Cardiac:  Normal rate  MSK:   Moves extremities without difficulty Neuro:  Cranial nerves II through XII are grossly intact, patient had equal strength in the upper lower extremities  Medical Decision Making  Medically screening exam initiated at 4:06 PM.  Appropriate orders placed.  Leticia Mcdiarmid was informed that the remainder of the evaluation will be completed by another provider, this initial triage assessment does not replace that evaluation, and the importance of remaining in the ED until their evaluation is complete.  Clinical Impression  Patient has a migraine, patient will need further work-up here in the emergency department.   Marcello Fennel, PA-C 08/09/20 1607

## 2020-08-09 NOTE — ED Triage Notes (Signed)
Patient here from home reporting migraine for 8 days. Hx of same. On maintenance meds with no relief.

## 2020-08-11 NOTE — Telephone Encounter (Signed)
Received (1) 200 unit vial of Botox today from [facility]. 

## 2020-08-16 ENCOUNTER — Encounter: Payer: Self-pay | Admitting: Gastroenterology

## 2020-08-16 ENCOUNTER — Other Ambulatory Visit: Payer: Self-pay | Admitting: *Deleted

## 2020-08-16 DIAGNOSIS — Z87891 Personal history of nicotine dependence: Secondary | ICD-10-CM

## 2020-09-07 ENCOUNTER — Other Ambulatory Visit (HOSPITAL_BASED_OUTPATIENT_CLINIC_OR_DEPARTMENT_OTHER): Payer: Self-pay

## 2020-09-07 ENCOUNTER — Ambulatory Visit: Payer: Medicare Other | Attending: Internal Medicine

## 2020-09-07 ENCOUNTER — Other Ambulatory Visit: Payer: Self-pay

## 2020-09-07 DIAGNOSIS — Z23 Encounter for immunization: Secondary | ICD-10-CM

## 2020-09-07 MED ORDER — COVID-19 MRNA VACC (MODERNA) 100 MCG/0.5ML IM SUSP
INTRAMUSCULAR | 0 refills | Status: DC
  Filled 2020-09-07: qty 0.25, 1d supply, fill #0

## 2020-09-07 NOTE — Progress Notes (Signed)
   Covid-19 Vaccination Clinic  Name:  Kyle Walters    MRN: [id number] DOB: [birth year]  09/07/2020  [name] was observed post Covid-19 immunization for 15 minutes without incident. He was provided with Vaccine Information Sheet and instruction to access the V-Safe system.   [name] was instructed to call 911 with any severe reactions post vaccine: Marland Kitchen Difficulty breathing  . Swelling of face and throat  . A fast heartbeat  . A bad rash all over body  . Dizziness and weakness   Immunizations Administered    Name Date Dose VIS Date Route   Moderna Covid-19 Booster Vaccine 09/07/2020  3:07 PM 0.25 mL 02/10/2020 Intramuscular   Manufacturer: Moderna   Lot: [id number]   [place]: [id number]

## 2020-09-16 ENCOUNTER — Emergency Department (HOSPITAL_COMMUNITY)
Admission: EM | Admit: 2020-09-16 | Discharge: 2020-09-16 | Disposition: A | Discharge status: Home / Self Care | Payer: Medicare Other | Attending: Emergency Medicine | Admitting: Emergency Medicine

## 2020-09-16 ENCOUNTER — Other Ambulatory Visit: Payer: Self-pay

## 2020-09-16 ENCOUNTER — Encounter (HOSPITAL_COMMUNITY): Payer: Self-pay

## 2020-09-16 DIAGNOSIS — H53149 Visual discomfort, unspecified: Secondary | ICD-10-CM | POA: Insufficient documentation

## 2020-09-16 DIAGNOSIS — J449 Chronic obstructive pulmonary disease, unspecified: Secondary | ICD-10-CM | POA: Insufficient documentation

## 2020-09-16 DIAGNOSIS — Z79899 Other long term (current) drug therapy: Secondary | ICD-10-CM | POA: Diagnosis not present

## 2020-09-16 DIAGNOSIS — R519 Headache, unspecified: Secondary | ICD-10-CM | POA: Insufficient documentation

## 2020-09-16 DIAGNOSIS — Z87891 Personal history of nicotine dependence: Secondary | ICD-10-CM | POA: Insufficient documentation

## 2020-09-16 MED ORDER — METOCLOPRAMIDE HCL 5 MG/ML IJ SOLN
5.0000 mg | Freq: Once | INTRAMUSCULAR | Status: AC
  Administered 2020-09-16: 5 mg via INTRAVENOUS
  Filled 2020-09-16: qty 2

## 2020-09-16 MED ORDER — SODIUM CHLORIDE 0.9 % IV BOLUS
500.0000 mL | Freq: Once | INTRAVENOUS | Status: AC
  Administered 2020-09-16: 500 mL via INTRAVENOUS

## 2020-09-16 MED ORDER — KETOROLAC TROMETHAMINE 30 MG/ML IJ SOLN
15.0000 mg | Freq: Once | INTRAMUSCULAR | Status: AC
  Administered 2020-09-16: 15 mg via INTRAVENOUS
  Filled 2020-09-16: qty 1

## 2020-09-16 NOTE — ED Triage Notes (Signed)
Patient reports a migraine x 6 days. Patient also reports blurred vision,nausea and sensitivity to light.

## 2020-09-16 NOTE — ED Provider Notes (Signed)
[facility] DEPT Provider Note   CSN: [id number] Arrival date & time: 09/16/20  1315     History Chief Complaint  Patient presents with  . Migraine     Kyle Walters is a 65 y.o. male who complains of migraine headache for 6 day(s). He has a well established history of recurrent migraines.  Description of pain: throbbing pain, unilateral on the right side. Associated symptoms: aura, light sensitivity and nausea. Patient has already taken home abortive therapies for this headache without relief.  There are no associated abnormal neurological symptoms such as TIA's, loss of balance, loss of vision or speech, numbness or weakness on review. Past neurological history: negative for stroke, MS, epilepsy, or brain tumor.     The history is provided by the patient and medical records. No language interpreter was used.  Migraine This is a chronic problem. Episode onset: 6 days. The problem occurs constantly. The problem has not changed since onset.Associated symptoms include headaches. Pertinent negatives include no chest pain, no abdominal pain and no shortness of breath. Exacerbated by: light. Nothing relieves the symptoms. The treatment provided no relief.   Kyle Walters     Past Medical History:  Diagnosis Date  . ADHD (attention deficit hyperactivity disorder)   . Anxiety panic attack  . Arthritis   . COPD (chronic obstructive pulmonary disease) (HCC)    bronchitis/emphysema  . Depression   . Emphysema lung ([place])   . Enlarged prostate   . GERD (gastroesophageal reflux disease)   . Migraine   . Panic attack     Patient Active Problem List   Diagnosis Date Noted  . Chronic migraine without aura, with intractable migraine, so stated, with status migrainosus 07/14/2020  . Severe episode of recurrent major depressive disorder, without psychotic features ([place]) 07/14/2020  . Polyclonal gammopathy determined by serum protein electrophoresis 02/25/2018   . Primary osteoarthritis of both feet 12/13/2017  . History of BPH 12/13/2017  . Family history of psoriasis in mother 12/13/2017  . History of gastroesophageal reflux (GERD) 12/13/2017  . Anxiety and depression 12/13/2017  . COPD (chronic obstructive pulmonary disease) ([place]) 01/29/2013  . CHEST PAIN-PRECORDIAL 11/01/2009  . Primary osteoarthritis of both hands 10/12/2009  . CHEST PAIN 10/12/2009    Past Surgical History:  Procedure Laterality Date  . NO PAST SURGERIES         Family History  Problem Relation Walters of Onset  . Breast cancer Mother   . Diabetes Mother   . Osteoarthritis Mother   . Bladder Cancer Father   . Diabetes Father   . Stroke Father   . Hypertension Father   . Pancreatic cancer Father   . Migraines Father     Social History   Tobacco Use  . Smoking status: Former Smoker    Packs/day: 1.00    Years: 38.00    Pack years: 38.00    Types: Cigarettes    Quit date: 05/17/2013    Years since quitting: 7.3  . Smokeless tobacco: Never Used  . Tobacco comment: Also using E-Cig  Vaping Use  . Vaping Use: Former  Substance Use Topics  . Alcohol use: No    Alcohol/week: 0.0 standard drinks  . Drug use: No    Home Medications Prior to Admission medications   Medication Sig Start Date End Date Taking? Authorizing Provider  albuterol (PROAIR HFA) 108 (90 Base) MCG/ACT inhaler Inhale 2 puffs into the lungs every 6 (six) hours as needed for wheezing or shortness of  breath. 05/27/20   Collene Gobble, MD  ALPRAZolam Duanne Moron) 1 MG tablet Take 1 mg by mouth 4 (four) times daily.     [provider]  Botulinum Toxin Type A (BOTOX) 200 units SOLR Provider to inject 155 units into the muscles of the head and neck every 3 months. Discard remainder. 08/02/20   Melvenia Beam, MD  Cholecalciferol (VITAMIN D3) 2000 UNITS TABS Take 2 tablets by mouth as needed.    [provider]  COVID-19 mRNA vaccine, Moderna, 100 MCG/0.5ML injection Inject into  the muscle. 09/07/20   Carlyle Basques, MD  doxazosin (CARDURA) 4 MG tablet 2 tablets daily. 06/13/13   [provider]  eletriptan (RELPAX) 40 MG tablet Take 40 mg by mouth as needed.    [provider]  escitalopram (LEXAPRO) 20 MG tablet Take 20 mg by mouth at bedtime. Take 1/2 table by mouth daily 10/26/14   [provider]  ferrous sulfate 325 (65 FE) MG tablet Take by mouth.    [provider]  fluticasone furoate-vilanterol (BREO ELLIPTA) 100-25 MCG/INH AEPB INHALE 1 PUFF BY MOUTH EVERY DAY 07/26/20   Collene Gobble, MD  gabapentin (NEURONTIN) 400 MG capsule Take 400 mg by mouth 4 (four) times daily.     [provider]  Galcanezumab-gnlm (EMGALITY, 300 MG DOSE,) 100 MG/ML SOSY Inject 300 mg into the skin every 30 (thirty) days.    [provider]  lansoprazole (PREVACID) 15 MG capsule Take 15 mg by mouth daily.    [provider]  memantine (NAMENDA) 10 MG tablet Take by mouth. Take 20 mg in the morning and 10 mg in the late afternoon 10/28/17   [provider]  MISC NATURAL PRODUCTS PO Take by mouth daily. GLIACIN    [provider]  Multiple Vitamins-Minerals (CENTRUM SILVER ADULT 50+ PO) Take by mouth.    [provider]  nicotine (NICODERM CQ - DOSED IN MG/24 HOURS) 21 mg/24hr patch Place 21 mg onto the skin daily.    [provider]  OnabotulinumtoxinA (BOTOX IJ) Inject as directed every 3 (three) months.    [provider]  simvastatin (ZOCOR) 20 MG tablet Take 20 mg by mouth at bedtime. 04/14/20   [provider]  VENTOLIN HFA 108 (90 Base) MCG/ACT inhaler TAKE 2 PUFFS BY MOUTH EVERY 6 HOURS AS NEEDED FOR WHEEZE OR SHORTNESS OF BREATH 05/27/20   Collene Gobble, MD    Allergies    Tetracycline, Nsaids, Bactrim [sulfamethoxazole-trimethoprim], Prednisone, and Effexor [venlafaxine hcl]  Review of Systems   Review of Systems  Constitutional: Negative for chills and fever.   HENT: Negative for congestion and sinus pain.   Eyes: Positive for photophobia. Negative for pain and visual disturbance.  Respiratory: Negative for shortness of breath.   Cardiovascular: Negative for chest pain.  Gastrointestinal: Negative for abdominal pain, nausea and vomiting.  Genitourinary: Negative for dysuria.  Musculoskeletal: Negative for neck stiffness.  Skin: Negative for rash.  Neurological: Positive for headaches. Negative for dizziness, tremors, seizures, facial asymmetry, speech difficulty, weakness, light-headedness and numbness.  Psychiatric/Behavioral: Negative for agitation and confusion.    Physical Exam Updated Vital Signs BP 113/64   Pulse (!) 55   Temp 98.1 F (36.7 C) (Oral)   Resp 16   Ht 5\' 7"  (1.702 m)   Wt 88.5 kg   SpO2 94%   BMI 30.54 kg/m   Physical Exam Vitals and nursing note reviewed.  Constitutional:  General: He is not in acute distress.    Appearance: He is well-developed. He is not diaphoretic.  HENT:     Head: Normocephalic and atraumatic.  Eyes:     General: No scleral icterus.    Conjunctiva/sclera: Conjunctivae normal.     Pupils: Pupils are equal, round, and reactive to light.     Comments: No horizontal, vertical or rotational nystagmus  Neck:     Comments: Full active and passive ROM without pain No midline or paraspinal tenderness No nuchal rigidity or meningeal signs Cardiovascular:     Rate and Rhythm: Normal rate and regular rhythm.  Pulmonary:     Effort: Pulmonary effort is normal. No respiratory distress.     Breath sounds: Normal breath sounds. No wheezing or rales.  Abdominal:     General: Bowel sounds are normal.     Palpations: Abdomen is soft.     Tenderness: There is no abdominal tenderness. There is no guarding or rebound.  Musculoskeletal:        General: Normal range of motion.     Cervical back: Normal range of motion and neck supple.  Lymphadenopathy:     Cervical: No cervical adenopathy.   Skin:    General: Skin is warm and dry.     Findings: No rash.  Neurological:     Mental Status: He is alert and oriented to person, place, and time.     Cranial Nerves: No cranial nerve deficit.     Motor: No abnormal muscle tone.     Coordination: Coordination normal.     Comments: Mental Status:  Alert, oriented, thought content appropriate. Speech fluent without evidence of aphasia. Able to follow 2 step commands without difficulty.  Cranial Nerves:  II:  Peripheral visual fields grossly normal, pupils equal, round, reactive to light III,IV, VI: ptosis not present, extra-ocular motions intact bilaterally  V,VII: smile symmetric, facial light touch sensation equal VIII: hearing grossly normal bilaterally  IX,X: midline uvula rise  XI: bilateral shoulder shrug equal and strong XII: midline tongue extension  Motor:  5/5 in upper and lower extremities bilaterally including strong and equal grip strength and dorsiflexion/plantar flexion Sensory: Pinprick and light touch normal in all extremities.  Cerebellar: normal finger-to-nose with bilateral upper extremities Gait: normal gait and balance CV: distal pulses palpable throughout   Psychiatric:        Behavior: Behavior normal.        Thought Content: Thought content normal.        Judgment: Judgment normal.     ED Results / Procedures / Treatments   Labs (all labs ordered are listed, but only abnormal results are displayed) Labs Reviewed - No data to display  EKG None  Radiology No results found.  Procedures Procedures   Medications Ordered in ED Medications  sodium chloride 0.9 % bolus 500 mL (0 mLs Intravenous Stopped 09/16/20 1504)  ketorolac (TORADOL) 30 MG/ML injection 15 mg (15 mg Intravenous Given 09/16/20 1431)  metoCLOPramide (REGLAN) injection 5 mg (5 mg Intravenous Given 09/16/20 1430)    ED Course  I have reviewed the triage vital signs and the nursing notes.  Pertinent labs & imaging results that  were available during my care of the patient were reviewed by me and considered in my medical decision making (see chart for details).    MDM Rules/Calculators/A&P                         Kyle Walters  Kyle Walters presents with headache Given the large differential diagnosis for Kyle Walters, the decision making in this case is of high complexity.  After evaluating all of the data points in this case, the presentation of Kyle Walters is NOT consistent with skull fracture, meningitis/encephalitis, SAH/sentinel bleed, Intracranial Hemorrhage (ICH) (subdural/epidural), acute obstructive hydrocephalus, space occupying lesions, CVA, CO Poisoning, Basilar/vertebral artery dissection, preeclampsia, cerebral venous thrombosis, hypertensive emergency, temporal Arteritis, Idiopathic Intracranial Hypertension (pseudotumor cerebri).  Headache symptoms are consistent with chronic, recurrent migraine headache.  Patient given IV fluids, Reglan and Toradol with significant improvement in his symptoms.  Strict return and follow-up precautions have been given by me personally or by detailed written instructions verbalized by nursing staff using the teach back method to patient/family/caregiver.  Data Reviewed/Counseling: I have reviewed the patient's vital signs, nursing notes, and other relevant tests/information. I had a detailed discussion regarding the historical points, exam findings, and any diagnostic results supporting the discharge diagnosis. I also discussed the need for outpatient follow-up and the need to return to the ED if symptoms worsen or if there are any questions or concerns that arise at [facility]  Final Clinical Impression(s) / ED Diagnoses Final diagnoses:  Bad headache    Rx / DC Orders ED Discharge Orders    None       Margarita Mail, PA-C 09/16/20 1510    Luna Fuse, MD 09/23/20 [phone]

## 2020-09-16 NOTE — ED Notes (Signed)
Pt states he has a history of migraines and is currently taking memantine, elitriptin for abortive therapy which he states has not been helping as he has been having on going migraine sx for 6 days. Pt endorses spots in his vision but denies blurry vision, slurred speech. Pt endorses nausea but no vomiting at this time

## 2020-09-16 NOTE — ED Notes (Signed)
Pt reports "feeling better" at this time.

## 2020-09-16 NOTE — ED Notes (Signed)
Pt states he is unable to take any benadryl due to his BPH

## 2020-09-16 NOTE — Discharge Instructions (Signed)

## 2020-09-21 ENCOUNTER — Ambulatory Visit: Payer: Medicare Other | Admitting: Gastroenterology

## 2020-09-21 ENCOUNTER — Encounter: Payer: Self-pay | Admitting: Gastroenterology

## 2020-09-21 VITALS — BP 110/70 | HR 67 | Ht 66.5 in | Wt 198.0 lb

## 2020-09-21 DIAGNOSIS — Z1211 Encounter for screening for malignant neoplasm of colon: Secondary | ICD-10-CM

## 2020-09-21 DIAGNOSIS — K219 Gastro-esophageal reflux disease without esophagitis: Secondary | ICD-10-CM

## 2020-09-21 DIAGNOSIS — R1310 Dysphagia, unspecified: Secondary | ICD-10-CM | POA: Diagnosis not present

## 2020-09-21 DIAGNOSIS — I219 Acute myocardial infarction, unspecified: Secondary | ICD-10-CM | POA: Insufficient documentation

## 2020-09-21 MED ORDER — SUPREP BOWEL PREP KIT 17.5-3.13-1.6 GM/177ML PO SOLN: 1.0000 | ORAL | 0 refills | Status: DC

## 2020-09-21 NOTE — Progress Notes (Signed)
HPI: This is a pleasant 65 year old man who was referred to me by Elwyn Reach, MD  to evaluate colon cancer screening, chronic GERD.    He is here with his wife today.  He is wearing sunglasses and talks in quite a slow fashion.  He told me that he has started [facility] therapy for migraine headaches and they turned up the magnet recently and so he is not thinking very clearly  He had a colonoscopy 15 years ago.  This is for unclear reasons however he does believe he was found to have a "benign polyp".  He was supposed to have a second colonoscopy at some point but never went through with it.  He never has bleeding.  He is not really bothered by constipation or diarrhea except some mild constipation that is associated with one of his medicines.  Colon cancer does not run in his family.  He has had GERD for 10 to 15 years.  He describes it as heartburn on nearly a daily basis sometimes in the middle of the night.  He does have minor intermittent solid food dysphagia that is nonprogressive.  He takes proton pump inhibitor lansoprazole 15 mg before breakfast every morning and this usually helps quite well.  Sometimes he will have to take another pill for breakthrough.  Old Data Reviewed: I have a printed copy of blood test results from September 2021 CBC was completely normal.,  LFTs were normal as well.    Review of systems: Pertinent positive and negative review of systems were noted in the above HPI section. All other review negative.   Past Medical History:  Diagnosis Date  . ADHD (attention deficit hyperactivity disorder)   . Anemia   . Anxiety panic attack  . Arthritis   . COPD (chronic obstructive pulmonary disease) (HCC)    bronchitis/emphysema  . Depression   . Emphysema lung ([facility])   . Enlarged prostate   . Fibromyalgia   . GERD (gastroesophageal reflux disease)   . Hyperlipidemia   . Hypertension   . IBS (irritable bowel syndrome)   . Migraine   . Panic attack      Past Surgical History:  Procedure Laterality Date  . NO PAST SURGERIES      Current Outpatient Medications  Medication Sig Dispense Refill  . albuterol (PROAIR HFA) 108 (90 Base) MCG/ACT inhaler Inhale 2 puffs into the lungs every 6 (six) hours as needed for wheezing or shortness of breath. 8 g 3  . ALPRAZolam (XANAX) 1 MG tablet Take 1 mg by mouth 4 (four) times daily.     . Botulinum Toxin Type A (BOTOX) 200 units SOLR Provider to inject 155 units into the muscles of the head and neck every 3 months. Discard remainder. 1 each 3  . Cholecalciferol (VITAMIN D3) 2000 UNITS TABS Take 2 tablets by mouth as needed.    Marland Kitchen COVID-19 mRNA vaccine, Moderna, 100 MCG/0.5ML injection Inject into the muscle. 0.25 mL 0  . doxazosin (CARDURA) 4 MG tablet 2 tablets daily.    Marland Kitchen eletriptan (RELPAX) 40 MG tablet Take 40 mg by mouth as needed.    Marland Kitchen escitalopram (LEXAPRO) 20 MG tablet Take 20 mg by mouth at bedtime. Take 1/2 table by mouth daily  2  . ferrous sulfate 325 (65 FE) MG tablet Take by mouth.    . fluticasone furoate-vilanterol (BREO ELLIPTA) 100-25 MCG/INH AEPB INHALE 1 PUFF BY MOUTH EVERY DAY 60 each 0  . gabapentin (NEURONTIN) 400 MG capsule  Take 400 mg by mouth 4 (four) times daily.     . Galcanezumab-gnlm (EMGALITY, 300 MG DOSE,) 100 MG/ML SOSY Inject 300 mg into the skin every 30 (thirty) days.    . lansoprazole (PREVACID) 15 MG capsule Take 15 mg by mouth daily.    . memantine (NAMENDA) 10 MG tablet Take by mouth. Take 20 mg in the morning and 10 mg in the late afternoon  6  . MISC NATURAL PRODUCTS PO Take by mouth daily. GLIACIN    . Multiple Vitamins-Minerals (CENTRUM SILVER ADULT 50+ PO) Take by mouth.    . nicotine (NICODERM CQ - DOSED IN MG/24 HOURS) 21 mg/24hr patch Place 21 mg onto the skin daily.    . OnabotulinumtoxinA (BOTOX IJ) Inject as directed every 3 (three) months.    . simvastatin (ZOCOR) 20 MG tablet Take 20 mg by mouth at bedtime.    . VENTOLIN HFA 108 (90 Base) MCG/ACT  inhaler TAKE 2 PUFFS BY MOUTH EVERY 6 HOURS AS NEEDED FOR WHEEZE OR SHORTNESS OF BREATH 18 each 3   No current facility-administered medications for this visit.    Allergies as of 09/21/2020 - Review Complete 09/21/2020  Allergen Reaction Noted  . Tetracycline Anaphylaxis and Other (See Comments) 11/30/2015  . Prednisone Other (See Comments) 11/15/2017  . Sulfamethoxazole-trimethoprim Nausea And Vomiting and Other (See Comments) 08/24/2011  . Effexor [venlafaxine hcl] Rash 08/24/2011  . Venlafaxine Rash 11/30/2015    Family History  Problem Relation Age of Onset  . Breast cancer Mother   . Diabetes Mother   . Osteoarthritis Mother   . Stroke Mother   . Bladder Cancer Father   . Diabetes Father   . Stroke Father   . Hypertension Father   . Pancreatic cancer Father   . Migraines Father   . Irritable bowel syndrome Father   . Hypertension Brother   . Other Brother        stomach ulcer  . Colon cancer Neg Hx   . Rectal cancer Neg Hx   . Esophageal cancer Neg Hx     Social History   Socioeconomic History  . Marital status: Married    Spouse name: [name]  . Number of children: 1  . Years of education: Not on file  . Highest education level: Not on file  Occupational History  . Occupation: disabled  Tobacco Use  . Smoking status: Former Smoker    Packs/day: 1.00    Years: 38.00    Pack years: 38.00    Types: Cigarettes    Quit date: 05/17/2013    Years since quitting: 7.3  . Smokeless tobacco: Never Used  . Tobacco comment: Also using E-Cig  Vaping Use  . Vaping Use: Former  Substance and Sexual Activity  . Alcohol use: No    Alcohol/week: 0.0 standard drinks  . Drug use: No  . Sexual activity: Not on file  Other Topics Concern  . Not on file  Social History Narrative   Lives at home with wife and stepson    Right handed   Caffeine: 8 oz coke per day    Social Determinants of Health   Financial Resource Strain: Not on file  Food Insecurity: Not on file   Transportation Needs: Not on file  Physical Activity: Not on file  Stress: Not on file  Social Connections: Not on file  Intimate Partner Violence: Not on file     Physical Exam: BP 110/70   Pulse 67   Ht 5'  6.5" (1.689 m)   Wt 198 lb (89.8 kg)   BMI 31.48 kg/m  Constitutional: generally well-appearing Psychiatric: alert and oriented x3 Eyes: extraocular movements intact Mouth: oral pharynx moist, no lesions Neck: supple no lymphadenopathy Cardiovascular: heart regular rate and rhythm Lungs: clear to auscultation bilaterally Abdomen: soft, nontender, nondistended, no obvious ascites, no peritoneal signs, normal bowel sounds Extremities: no lower extremity edema bilaterally Skin: no lesions on visible extremities   Assessment and plan: 65 y.o. male with chronic GERD, mild nonprogressive dysphagia, routine risk for colon cancer  We will reach out to the office of his previous gastroenterologist, Dr. Collene Mares, to obtain records of his colonoscopy which was about 15 years ago and any associated pathology.  I do believe he is due for colon cancer screening with a colonoscopy and we will arrange for that to be done at his soonest convenience.  At the same time I recommended proceeding with an EGD to screen him for Barrett's, esophageal complications from longstanding GERD.  He will continue on his proton pump inhibitor once daily for now.  Please see the "Patient Instructions" section for addition details about the plan.   Owens Loffler, MD [facility] 09/21/2020, 3:13 PM  Cc: Elwyn Reach, MD  Total time on date of encounter was 45  minutes (this included time spent preparing to see the patient reviewing records; obtaining and/or reviewing separately obtained history; performing a medically appropriate exam and/or evaluation; counseling and educating the patient and family if present; ordering medications, tests or procedures if applicable; and documenting clinical  information in the health record).

## 2020-09-21 NOTE — Patient Instructions (Signed)
If you are age 66 or older, your body mass index should be between 23-30. Your Body mass index is 31.48 kg/m. If this is out of the aforementioned range listed, please consider follow up with your Primary Care Provider. __________________________________________________________  The [facility] providers would like to encourage you to use [facility] to communicate with providers for non-urgent requests or questions.  Due to long hold times on the telephone, sending your provider a message by [facility] may be a faster and more efficient way to get a response.  Please allow 48 business hours for a response.  Please remember that this is for non-urgent requests.  ___________________________________________________________  Dennis Bast have been scheduled for an endoscopy and colonoscopy. Please follow the written instructions given to you at your visit today. Please pick up your prep supplies at the pharmacy within the next 1-3 days. If you use inhalers (even only as needed), please bring them with you on the day of your procedure.  Due to recent changes in healthcare laws, you may see the results of your imaging and laboratory studies on MyChart before your provider has had a chance to review them.  We understand that in some cases there may be results that are confusing or concerning to you. Not all laboratory results come back in the same time frame and the provider may be waiting for multiple results in order to interpret others.  Please give [place] 48 hours in order for your provider to thoroughly review all the results before contacting the office for clarification of your results.   Thank you for entrusting me with your care and choosing [facility].  Dr Ardis Hughs

## 2020-09-26 ENCOUNTER — Encounter: Payer: Self-pay | Admitting: Acute Care

## 2020-09-26 ENCOUNTER — Other Ambulatory Visit: Payer: Self-pay

## 2020-09-26 ENCOUNTER — Ambulatory Visit (INDEPENDENT_AMBULATORY_CARE_PROVIDER_SITE_OTHER): Payer: Medicare Other | Admitting: Acute Care

## 2020-09-26 ENCOUNTER — Ambulatory Visit (INDEPENDENT_AMBULATORY_CARE_PROVIDER_SITE_OTHER)
Admission: RE | Admit: 2020-09-26 | Discharge: 2020-09-26 | Disposition: A | Discharge status: Home / Self Care | Payer: Medicare Other | Source: Ambulatory Visit | Attending: Cardiology | Admitting: Cardiology

## 2020-09-26 VITALS — BP 122/84 | HR 84 | Temp 97.8°F | Ht 67.0 in | Wt 198.4 lb

## 2020-09-26 DIAGNOSIS — Z87891 Personal history of nicotine dependence: Secondary | ICD-10-CM

## 2020-09-26 NOTE — Progress Notes (Signed)
Shared Decision Making Visit Lung Cancer Screening Program [phone])   Eligibility:  Age 65 y.o.  Pack Years Smoking History Calculation 38 pack year smoking history (# packs/per year x # years smoked)  Recent History of coughing up blood  no  Unexplained weight loss? no ( >Than 15 pounds within the last 6 months )  Prior History Lung / other cancer no (Diagnosis within the last 5 years already requiring surveillance chest CT Scans).  Smoking Status Former Smoker  Former Smokers: Years since quit: 7 years  Quit Date: 2015  Visit Components:  Discussion included one or more decision making aids. yes  Discussion included risk/benefits of screening. yes  Discussion included potential follow up diagnostic testing for abnormal scans. yes  Discussion included meaning and risk of over diagnosis. yes  Discussion included meaning and risk of False Positives. yes  Discussion included meaning of total radiation exposure. yes  Counseling Included:  Importance of adherence to annual lung cancer LDCT screening. yes  Impact of comorbidities on ability to participate in the program. yes  Ability and willingness to under diagnostic treatment. yes  Smoking Cessation Counseling:  Current Smokers:   Discussed importance of smoking cessation. yes  Information about tobacco cessation classes and interventions provided to patient. yes  Patient provided with "ticket" for LDCT Scan. yes  Symptomatic Patient. no  Counseling NA  Diagnosis Code: Tobacco Use Z72.0  Asymptomatic Patient yes  Counseling (Intermediate counseling: > three minutes counseling) Q2229  Former Smokers:   Discussed the importance of maintaining cigarette abstinence. yes  Diagnosis Code: Personal History of Nicotine Dependence. N98.921  Information about tobacco cessation classes and interventions provided to patient. Yes  Patient provided with "ticket" for LDCT Scan. yes  Written Order for Lung  Cancer Screening with LDCT placed in Epic. Yes (CT Chest Lung Cancer Screening Low Dose W/O CM) [id number] Z12.2-Screening of respiratory organs Z87.891-Personal history of nicotine dependence  I spent 25 minutes of face to face time with Mr. Hassing discussing the risks and benefits of lung cancer screening. We viewed a power point together that explained in detail the above noted topics. We took the time to pause the power point at intervals to allow for questions to be asked and answered to ensure understanding. We discussed that he had taken the single most powerful action possible to decrease his risk of developing lung cancer when he quit smoking. I counseled him to remain smoke free, and to contact me if he ever had the desire to smoke again so that I can provide resources and tools to help support the effort to remain smoke free. We discussed the time and location of the scan, and that either  Doroteo Glassman RN or I will call with the results within  24-48 hours of receiving them. He has my card and contact information in the event he needs to speak with me, in addition to a copy of the power point we reviewed as a resource. He verbalized understanding of all of the above and had no further questions upon leaving the office.     I explained to the patient that there has been a high incidence of coronary artery disease noted on these exams. I explained that this is a non-gated exam therefore degree or severity cannot be determined. This patient is on statin therapy. I have asked the patient to follow-up with their PCP regarding any incidental finding of coronary artery disease and management with diet or medication as they feel is clinically  indicated. The patient verbalized understanding of the above and had no further questions.   [name], NP 09/26/2020

## 2020-09-26 NOTE — Patient Instructions (Signed)
Thank you for participating in the [facility] Cancer Screening Program. It was our pleasure to meet you today. We will call you with the results of your scan within the next few days. Your scan will be assigned a Lung RADS category score by the physicians reading the scans.  This Lung RADS score determines follow up scanning.  See below for description of categories, and follow up screening recommendations. We will be in touch to schedule your follow up screening annually or based on recommendations of our providers. We will fax a copy of your scan results to your Primary Care Physician, or the physician who referred you to the program, to ensure they have the results. Please call the office if you have any questions or concerns regarding your scanning experience or results.  Our office number is [phone]. Please speak with Denise Phelps, RN. She is our Lung Cancer Screening RN. If she is unavailable when you call, please have the office staff send her a message. She will return your call at her earliest convenience. Remember, if your scan is normal, we will scan you annually as long as you continue to meet the criteria for the program. (Age 55-77, Current smoker or smoker who has quit within the last 15 years). If you are a smoker, remember, quitting is the single most powerful action that you can take to decrease your risk of lung cancer and other pulmonary, breathing related problems. We know quitting is hard, and we are here to help.  Please let us know if there is anything we can do to help you meet your goal of quitting. If you are a former smoker, congratulations. We are proud of you! Remain smoke free! Remember you can refer friends or family members through the number above.  We will screen them to make sure they meet criteria for the program. Thank you for helping us take better care of you by participating in Lung Screening.  Lung RADS Categories:  Lung RADS 1: no nodules  or definitely non-concerning nodules.  Recommendation is for a repeat annual scan in 12 months.  Lung RADS 2:  nodules that are non-concerning in appearance and behavior with a very low likelihood of becoming an active cancer. Recommendation is for a repeat annual scan in 12 months.  Lung RADS 3: nodules that are probably non-concerning , includes nodules with a low likelihood of becoming an active cancer.  Recommendation is for a 6-month repeat screening scan. Often noted after an upper respiratory illness. We will be in touch to make sure you have no questions, and to schedule your 6-month scan.  Lung RADS 4 A: nodules with concerning findings, recommendation is most often for a follow up scan in 3 months or additional testing based on our provider's assessment of the scan. We will be in touch to make sure you have no questions and to schedule the recommended 3 month follow up scan.  Lung RADS 4 B:  indicates findings that are concerning. We will be in touch with you to schedule additional diagnostic testing based on our provider's  assessment of the scan.   

## 2020-09-27 ENCOUNTER — Other Ambulatory Visit: Payer: Self-pay | Admitting: Emergency Medicine

## 2020-09-27 NOTE — Telephone Encounter (Signed)
Sarah, Please see patient comment and advise.  Thank you.

## 2020-09-28 ENCOUNTER — Telehealth: Payer: Self-pay | Admitting: *Deleted

## 2020-09-28 ENCOUNTER — Ambulatory Visit (INDEPENDENT_AMBULATORY_CARE_PROVIDER_SITE_OTHER): Payer: Medicare Other | Admitting: Acute Care

## 2020-09-28 ENCOUNTER — Encounter: Payer: Self-pay | Admitting: Acute Care

## 2020-09-28 DIAGNOSIS — R931 Abnormal findings on diagnostic imaging of heart and coronary circulation: Secondary | ICD-10-CM | POA: Diagnosis not present

## 2020-09-28 NOTE — Addendum Note (Signed)
Addended by: Vanessa Barbara on: 09/28/2020 05:25 PM   Modules accepted: Orders

## 2020-09-28 NOTE — Patient Instructions (Addendum)
It is good to talk with you today. We will order a 2 D Echo of your heart to better evaluate the finding of a Broad-based outpouching of the left ventricular apex (series 2/image 46), raising the possibility of a left ventricular aneurysm, possibly related to prior myocardial infarction. Pt. Would like Echo as soon as possible We will refer you to cardiology for further evaluation at your request.  Per the patient's request, please make first available, with provider who has first availability. Please make sure they call [phone] to schedule. We will do a follow up low dose CT of the Chest in 12 months. Please contact office for sooner follow up if symptoms do not improve or worsen or seek emergency care

## 2020-09-28 NOTE — Telephone Encounter (Signed)
Pt sent another mychart message as he is still waiting to hear about the results of his lung cancer screen CT and what he needs to do based on some of the results that he saw on his mychart.  Pt was made aware that SG was not at office yesterday 6/7 when he sent his message and that we would let him know recs per SG once we heard from her.  Resending this back to SG with this info and also sending this to [place] as an [place].

## 2020-09-28 NOTE — Addendum Note (Signed)
Addended by: Vanessa Barbara on: 09/28/2020 05:09 PM   Modules accepted: Orders

## 2020-09-28 NOTE — Progress Notes (Signed)
Virtual Visit via Telephone Note  I connected with Kyle Walters on 09/28/20 at  4:00 PM EDT by telephone and verified that I am speaking with the correct person using two identifiers.  Location: Patient: at home Provider:  [address], [place], [place], [address]   I discussed the limitations, risks, security and privacy concerns of performing an evaluation and management service by telephone and the availability of in person appointments. I also discussed with the patient that there may be a patient responsible charge related to this service. The patient expressed understanding and agreed to proceed.  65 y.o. male former smoker ( Quit 2015 with a 38 pack year smoking history) followed by the Lung Cancer Screening program  with medical hx significant for ADHD, Anxiety, panic disorder,COPD,Depression,Emphysema of the lung,GERD,  And  migraine headaches. .    History of Present Illness: Pt.presents for follow up of his low dose CT results.His  scan was read as a Lung RADS 1, negative study: no nodules or definitely benign nodules. Radiology recommendation is for a repeat LDCT in 12 months.Pt.read his scan results on My Chart, and is very concerned about the finding of a Broad-based outpouching of the left ventricular apex (series 2/image 46), raising the possibility of a left ventricular aneurysm, possibly related to prior myocardial infarction noted by radiology..Pt. has a panic disorder and anxiety, and would appreciate immediate referral to cardiology . I will order the echo so that it is completed prior to the cardiology consult. When I asked him if he has chest pain, he states he has had chest pain x the last 40 years due to anxiety and panic disorder.. I have asked him if he can tell that there has been a change in what is his baseline chest pain due to anxiety, and he states perhaps there has been . He is really unsure. He did have a stress test in the past( 11/18/2009). Study conclusion  was no stress arrhythmias or conduction abnormalities. The stress ECG was negative for ischemia. - Staged echo: There was no echocardiographic evidence for stress-induced ischemia.  We will refer to cardiology and get an echo. Cards can refer for any further specialty they feel is needed if any.  Follow up Low Dose CT in 12 months      Observations/Objective: 09/26/2020 Low Dose CT Chest  Cardiovascular: Heart is normal in size. No pericardial effusion. Broad-based outpouching of the left ventricular apex (series 2/image 46), raising the possibility of a left ventricular aneurysm, possibly related to prior myocardial infarction.  No evidence of thoracic aortic aneurysm. Mild atherosclerotic calcifications of the aortic arch.  Three vessel coronary atherosclerosis.  Mediastinum/Nodes: No suspicious mediastinal lymphadenopathy.  Visualized thyroid is unremarkable.  Lungs/Pleura: Moderate centrilobular emphysematous changes, upper lung predominant.  No focal consolidation. Mild linear scarring/atelectasis in the lingula and left lower lobe. Additional mild linear scarring in the posterior right lower lobe.  1.6 mm calcified granuloma in the right lower lobe. No suspicious pulmonary nodules.  No pleural effusion or pneumothorax.  Upper Abdomen: Visualized upper abdomen is notable for small hepatic cysts and vascular calcifications.  Musculoskeletal: Mild degenerative changes of the visualized thoracolumbar spine.  IMPRESSION: Lung-RADS 1, negative. Continue annual screening with low-dose chest CT without contrast in 12 months.  Aortic Atherosclerosis (ICD10-I70.0) and Emphysema (ICD10-J43.9).  Assessment and Plan: Incidental Finding of Broad-based outpouching of the left ventricular apex (series 2/image 46), raising the possibility of a left ventricular aneurysm, possibly related to prior myocardial infarction. Plan  We will order a 2 D Echo of your heart to  better evaluate the finding of a Broad-based outpouching of the left ventricular apex (series 2/image 46), raising the possibility of a left ventricular aneurysm, possibly related to prior myocardial infarction. Pt. Would like Echo as soon as possible We will refer you to cardiology for further evaluation at your request.  Per the patient's request, please make first available, with provider who has first availability. Please make sure they call [phone] to schedule.( Wife's cell) We will do a follow up low dose CT of the Chest in 12 months. Please contact office for sooner follow up if symptoms do not improve or worsen or seek emergency care    Follow Up Instructions: Follow up with cardiology as referred Follow up Echo prior Follow up Low dose CT 09/2021   I discussed the assessment and treatment plan with the patient. The patient was provided an opportunity to ask questions and all were answered. The patient agreed with the plan and demonstrated an understanding of the instructions.   The patient was advised to call back or seek an in-person evaluation if the symptoms worsen or if the condition fails to improve as anticipated.  I provided 35 minutes of non-face-to-face time, including referral and ordering of exams,and personal review of imaging in addition to charting  during this encounter.   [name], NP 09/28/2020

## 2020-09-28 NOTE — Telephone Encounter (Signed)
Called and left a detailed vm per DPR for patient to follow up with cardiology (we are referring him to) in about a week around 10/05/20 after echocardiogram is completed.

## 2020-09-28 NOTE — Telephone Encounter (Signed)
ATC patient to set up a televist to discuss scan results.  Left a detailed VM (DPR) that Kyle Walters would like to have a telephone visit with him today to go over his scan results with him.  Advised to let us know what time today he would like to do the visit, we have 3:30 pm, 4 pm and 4:30 pm. Please be aware that when you send a mychart message there is a 48 hour turn around response time.  Kyle Walters was working in the hospital yesterday.  We respond to sick messages by the end of the business day. We look forward to hearing back from you regarding the time you want to do you televisit today.  Thank you.

## 2020-10-03 NOTE — Progress Notes (Signed)
Please see telephone visit dated 09/28/2020 . These results were shared with the patient at that time.  Langley Gauss, please fax to PCP and order 12 month annual CT Chest. Thanks so much

## 2020-10-04 ENCOUNTER — Ambulatory Visit: Payer: Medicare Other | Admitting: Neurology

## 2020-10-04 ENCOUNTER — Telehealth: Payer: Self-pay | Admitting: *Deleted

## 2020-10-04 ENCOUNTER — Other Ambulatory Visit: Payer: Self-pay | Admitting: *Deleted

## 2020-10-04 DIAGNOSIS — G43711 Chronic migraine without aura, intractable, with status migrainosus: Secondary | ICD-10-CM

## 2020-10-04 DIAGNOSIS — Z87891 Personal history of nicotine dependence: Secondary | ICD-10-CM

## 2020-10-04 DIAGNOSIS — G43109 Migraine with aura, not intractable, without status migrainosus: Secondary | ICD-10-CM

## 2020-10-04 MED ORDER — QULIPTA 60 MG PO TABS: 60.0000 mg | ORAL_TABLET | Freq: Every day | ORAL | 6 refills | Status: DC

## 2020-10-04 NOTE — Progress Notes (Signed)
Consent Form Botulism Toxin Injection For Chronic Migraine  10/04/2020; first Botox. He has chronic migraines without aura and those are doing better on botox. He has episodic migraines with aura will prescribe qulipta. We see his wife [name]  Reviewed orally with patient, additionally signature is on file:  Botulism toxin has been approved by the Federal drug administration for treatment of chronic migraine. Botulism toxin does not cure chronic migraine and it may not be effective in some patients.  The administration of botulism toxin is accomplished by injecting a small amount of toxin into the muscles of the neck and head. Dosage must be titrated for each individual. Any benefits resulting from botulism toxin tend to wear off after 3 months with a repeat injection required if benefit is to be maintained. Injections are usually done every 3-4 months with maximum effect peak achieved by about 2 or 3 weeks. Botulism toxin is expensive and you should be sure of what costs you will incur resulting from the injection.  The side effects of botulism toxin use for chronic migraine may include:   -Transient, and usually mild, facial weakness with facial injections  -Transient, and usually mild, head or neck weakness with head/neck injections  -Reduction or loss of forehead facial animation due to forehead muscle weakness  -Eyelid drooping  -Dry eye  -Pain at the site of injection or bruising at the site of injection  -Double vision  -Potential unknown long term risks  Contraindications: You should not have Botox if you are pregnant, nursing, allergic to albumin, have an infection, skin condition, or muscle weakness at the site of the injection, or have myasthenia gravis, Lambert-Eaton syndrome, or ALS.  It is also possible that as with any injection, there may be an allergic reaction or no effect from the medication. Reduced effectiveness after repeated injections is sometimes seen and  rarely infection at the injection site may occur. All care will be taken to prevent these side effects. If therapy is given over a long time, atrophy and wasting in the muscle injected may occur. Occasionally the patient's become refractory to treatment because they develop antibodies to the toxin. In this event, therapy needs to be modified.  I have read the above information and consent to the administration of botulism toxin.    BOTOX PROCEDURE NOTE FOR MIGRAINE HEADACHE    Contraindications and precautions discussed with patient(above). Aseptic procedure was observed and patient tolerated procedure. Procedure performed by Dr. Georgia Dom  The condition has existed for more than 6 months, and pt does not have a diagnosis of ALS, Myasthenia Gravis or Lambert-Eaton Syndrome.  Risks and benefits of injections discussed and pt agrees to proceed with the procedure.  Written consent obtained  These injections are medically necessary. Pt  receives good benefits from these injections. These injections do not cause sedations or hallucinations which the oral therapies may cause.  Description of procedure:  The patient was placed in a sitting position. The standard protocol was used for Botox as follows, with 5 units of Botox injected at each site:   -Procerus muscle, midline injection  -Corrugator muscle, bilateral injection  -Frontalis muscle, bilateral injection, with 2 sites each side, medial injection was performed in the upper one third of the frontalis muscle, in the region vertical from the medial inferior edge of the superior orbital rim. The lateral injection was again in the upper one third of the forehead vertically above the lateral limbus of the cornea, 1.5 cm lateral to the  medial injection site.  -Temporalis muscle injection, 4 sites, bilaterally. The first injection was 3 cm above the tragus of the ear, second injection site was 1.5 cm to 3 cm up from the first injection site in  line with the tragus of the ear. The third injection site was 1.5-3 cm forward between the first 2 injection sites. The fourth injection site was 1.5 cm posterior to the second injection site.   -Occipitalis muscle injection, 3 sites, bilaterally. The first injection was done one half way between the occipital protuberance and the tip of the mastoid process behind the ear. The second injection site was done lateral and superior to the first, 1 fingerbreadth from the first injection. The third injection site was 1 fingerbreadth superiorly and medially from the first injection site.  -Cervical paraspinal muscle injection, 2 sites, bilateral knee first injection site was 1 cm from the midline of the cervical spine, 3 cm inferior to the lower border of the occipital protuberance. The second injection site was 1.5 cm superiorly and laterally to the first injection site.  -Trapezius muscle injection was performed at 3 sites, bilaterally. The first injection site was in the upper trapezius muscle halfway between the inflection point of the neck, and the acromion. The second injection site was one half way between the acromion and the first injection site. The third injection was done between the first injection site and the inflection point of the neck.   Will return for repeat injection in 3 months.   200 units of Botox was used, any Botox not injected was wasted. The patient tolerated the procedure well, there were no complications of the above procedure.

## 2020-10-04 NOTE — Progress Notes (Signed)
Botox consent signed by patient  Botox- 200 units x 1 vial Lot: [id number] Expiration: 02/2023 NDC: 2527-1292-90  Bacteriostatic 0.9% Sodium Chloride- 38mL total Lot: [id number] Expiration: 09/21/2021 NDC: 9969-2493-24  Dx: N99.144 S/P

## 2020-10-04 NOTE — Telephone Encounter (Signed)
Qulipta 60 mg PO Daily ordered by Dr Jaynee Eagles. Order form has been completed and is pending Dr Cathren Laine signature, then it needs to be faxed to [facility].

## 2020-10-04 NOTE — Telephone Encounter (Signed)
Qulipta application faxed to [place]. Received a receipt of confirmation.

## 2020-10-05 NOTE — Telephone Encounter (Addendum)
Mychart message sent by pt: Kyle Bare "[name]"  [name], NP 7 minutes ago (3:47 PM)   I've tried very hard to be patient. I have an appointment at a cardiologist on July 20th.   I checked the After Visit summary, and it states that you were going to set up an echocardiogram before I saw the cardiologist. When I inquired further, your nurse simply wrote me that the cardiology department would contact me.   Are you setting up an echocardiogram? Is there some delay? You sounded a bit uncertain toward the end of our call. Can you actually set up an echocardiogram from your position? Could Dr. Lamonte Sakai, who I see at [facility] set one up?   I'm simply asking for information that I haven't gotten.   Thank you, [name]    Pt had visit with SG 6/8 and order was placed at that visit for echo. Please advise.

## 2020-10-05 NOTE — Telephone Encounter (Signed)
Received a message from [place] complete which stated they have thoroughly researched pt's coverage options. [employer] might be a coverage option. Contact them directly at [phone] for further information.   Qulipta complete phone number: [phone].   Patient notified.

## 2020-10-07 NOTE — Telephone Encounter (Signed)
Order was placed on 6/8 and we had to see if a prior auth was required.  That has been done and now has been sent to [facility] for scheduling.  I sent a message over to Edmon Crape who does scheduling and she will be giving pt a call.  Will route this to triage to make pt aware thru [place].

## 2020-10-12 ENCOUNTER — Telehealth: Payer: Self-pay | Admitting: Neurology

## 2020-10-12 ENCOUNTER — Emergency Department (HOSPITAL_COMMUNITY)
Admission: EM | Admit: 2020-10-12 | Discharge: 2020-10-12 | Disposition: A | Discharge status: Home / Self Care | Payer: Medicare Other | Attending: Emergency Medicine | Admitting: Emergency Medicine

## 2020-10-12 ENCOUNTER — Encounter (HOSPITAL_COMMUNITY): Payer: Self-pay | Admitting: *Deleted

## 2020-10-12 ENCOUNTER — Other Ambulatory Visit: Payer: Self-pay

## 2020-10-12 ENCOUNTER — Emergency Department (HOSPITAL_COMMUNITY): Payer: Medicare Other

## 2020-10-12 DIAGNOSIS — Z87891 Personal history of nicotine dependence: Secondary | ICD-10-CM | POA: Insufficient documentation

## 2020-10-12 DIAGNOSIS — J449 Chronic obstructive pulmonary disease, unspecified: Secondary | ICD-10-CM | POA: Insufficient documentation

## 2020-10-12 DIAGNOSIS — Z7982 Long term (current) use of aspirin: Secondary | ICD-10-CM | POA: Diagnosis not present

## 2020-10-12 DIAGNOSIS — I10 Essential (primary) hypertension: Secondary | ICD-10-CM | POA: Diagnosis not present

## 2020-10-12 DIAGNOSIS — Z79899 Other long term (current) drug therapy: Secondary | ICD-10-CM | POA: Diagnosis not present

## 2020-10-12 DIAGNOSIS — R072 Precordial pain: Secondary | ICD-10-CM | POA: Insufficient documentation

## 2020-10-12 DIAGNOSIS — R070 Pain in throat: Secondary | ICD-10-CM | POA: Insufficient documentation

## 2020-10-12 DIAGNOSIS — R079 Chest pain, unspecified: Secondary | ICD-10-CM

## 2020-10-12 DIAGNOSIS — R0789 Other chest pain: Secondary | ICD-10-CM | POA: Diagnosis not present

## 2020-10-12 DIAGNOSIS — M79602 Pain in left arm: Secondary | ICD-10-CM | POA: Insufficient documentation

## 2020-10-12 LAB — BASIC METABOLIC PANEL
Anion gap: 6 (ref 5–15)
BUN: 16 mg/dL (ref 8–23)
CO2: 28 mmol/L (ref 22–32)
Calcium: 9.9 mg/dL (ref 8.9–10.3)
Chloride: 102 mmol/L (ref 98–111)
Creatinine, Ser: 1.12 mg/dL (ref 0.61–1.24)
GFR, Estimated: >60 mL/min (ref >60)
Glucose, Bld: 115 mg/dL — ABNORMAL HIGH (ref 70–99)
Potassium: 4.2 mmol/L (ref 3.5–5.1)
Sodium: 136 mmol/L (ref 135–145)

## 2020-10-12 LAB — CBC
HCT: 43.6 % (ref 39.0–52.0)
Hemoglobin: 14.6 g/dL (ref 13.0–17.0)
MCH: 30.9 pg (ref 26.0–34.0)
MCHC: 33.5 g/dL (ref 30.0–36.0)
MCV: 92.4 fL (ref 80.0–100.0)
Platelets: 245 10*3/uL (ref 150–400)
RBC: 4.72 MIL/uL (ref 4.22–5.81)
RDW: 13.4 % (ref 11.5–15.5)
WBC: 7.3 10*3/uL (ref 4.0–10.5)
nRBC: 0 % (ref 0.0–0.2)

## 2020-10-12 LAB — TROPONIN I (HIGH SENSITIVITY)
Troponin I (High Sensitivity): 8 ng/L (ref <18)
Troponin I (High Sensitivity): 9 ng/L (ref <18)

## 2020-10-12 MED ORDER — ASPIRIN 81 MG PO CHEW
324.0000 mg | CHEWABLE_TABLET | Freq: Once | ORAL | Status: AC
  Administered 2020-10-12: 324 mg via ORAL
  Filled 2020-10-12: qty 4

## 2020-10-12 MED ORDER — ASPIRIN 81 MG PO CHEW: 81.0000 mg | CHEWABLE_TABLET | Freq: Every day | ORAL | 0 refills | Status: DC

## 2020-10-12 NOTE — ED Provider Notes (Signed)
[place] DEPT Provider Note   CSN: [id number] Arrival date & time: 10/12/20  1209     History Chief Complaint  Patient presents with   Chest Pain    Kyle Walters is a 65 y.o. male.  HPI  HPI: A 65 year old patient with a history of hypertension and hypercholesterolemia presents for evaluation of chest pain. Initial onset of pain was less than one hour ago. The patient's chest pain is described as heaviness/pressure/tightness and is not worse with exertion. The patient's chest pain is middle- or left-sided, is not well-localized, is not sharp and does radiate to the arms/jaw/neck. The patient does not complain of nausea and denies diaphoresis. The patient has no history of stroke, has no history of peripheral artery disease, has not smoked in the past 90 days, denies any history of treated diabetes, has no relevant family history of coronary artery disease (first degree relative at less than age 58) and does not have an elevated BMI (>=30).   Intermittent chest pain for the last few days.  Yesterday the pain became more severe after an argument.  This morning he woke up, still having the chest discomfort.  History of anxiety, but reports that this pain is different in his anxiety and GERD.  The pain has been persistent, no specific aggravating or relieving factors and the pain has not intensified with exertion or walking today.  Pain is described as pressure type pain with some soreness in his left arm and tightness in his throat.  Past Medical History:  Diagnosis Date   ADHD (attention deficit hyperactivity disorder)    Anemia    Anxiety panic attack   Arthritis    COPD (chronic obstructive pulmonary disease) (HCC)    bronchitis/emphysema   Depression    Emphysema lung (HCC)    Enlarged prostate    Fibromyalgia    GERD (gastroesophageal reflux disease)    Hyperlipidemia    Hypertension    IBS (irritable bowel syndrome)    Migraine    Panic  attack     Patient Active Problem List   Diagnosis Date Noted   Chronic migraine without aura, with intractable migraine, so stated, with status migrainosus 07/14/2020   Severe episode of recurrent major depressive disorder, without psychotic features ([place]) 07/14/2020   Polyclonal gammopathy determined by serum protein electrophoresis 02/25/2018   Primary osteoarthritis of both feet 12/13/2017   History of BPH 12/13/2017   Family history of psoriasis in mother 12/13/2017   History of gastroesophageal reflux (GERD) 12/13/2017   Anxiety and depression 12/13/2017   COPD (chronic obstructive pulmonary disease) ([place]) 01/29/2013   CHEST PAIN-PRECORDIAL 11/01/2009   Primary osteoarthritis of both hands 10/12/2009   CHEST PAIN 10/12/2009    Past Surgical History:  Procedure Laterality Date   NO PAST SURGERIES         Family History  Problem Relation Age of Onset   Breast cancer Mother    Diabetes Mother    Osteoarthritis Mother    Stroke Mother    Bladder Cancer Father    Diabetes Father    Stroke Father    Hypertension Father    Pancreatic cancer Father    Migraines Father    Irritable bowel syndrome Father    Hypertension Brother    Other Brother        stomach ulcer   Colon cancer Neg Hx    Rectal cancer Neg Hx    Esophageal cancer Neg Hx     Social  History   Tobacco Use   Smoking status: Former    Packs/day: 1.50    Years: 40.00    Pack years: 60.00    Types: Cigarettes    Quit date: 05/17/2013    Years since quitting: 7.4   Smokeless tobacco: Never   Tobacco comments:    Also using E-Cig  Vaping Use   Vaping Use: Former  Substance Use Topics   Alcohol use: No    Alcohol/week: 0.0 standard drinks   Drug use: No    Home Medications Prior to Admission medications   Medication Sig Start Date End Date Taking? Authorizing Provider  aspirin 81 MG chewable tablet Chew 1 tablet (81 mg total) by mouth daily. 10/12/20 11/11/20 Yes Varney Biles, MD  albuterol  (PROAIR HFA) 108 (90 Base) MCG/ACT inhaler Inhale 2 puffs into the lungs every 6 (six) hours as needed for wheezing or shortness of breath. 05/27/20   Collene Gobble, MD  ALPRAZolam Duanne Moron) 1 MG tablet Take 1 mg by mouth 4 (four) times daily.     [provider]  Atogepant (QULIPTA) 60 MG TABS Take 60 mg by mouth daily. 10/04/20   Melvenia Beam, MD  Botulinum Toxin Type A (BOTOX) 200 units SOLR Provider to inject 155 units into the muscles of the head and neck every 3 months. Discard remainder. 08/02/20   Melvenia Beam, MD  BREO ELLIPTA 100-25 MCG/INH AEPB INHALE 1 PUFF BY MOUTH EVERY DAY 09/27/20   Collene Gobble, MD  Cholecalciferol (VITAMIN D3) 2000 UNITS TABS Take 2 tablets by mouth as needed.    [provider]  COVID-19 mRNA vaccine, Moderna, 100 MCG/0.5ML injection Inject into the muscle. 09/07/20   Carlyle Basques, MD  doxazosin (CARDURA) 4 MG tablet 2 tablets daily. 06/13/13   [provider]  eletriptan (RELPAX) 40 MG tablet Take 40 mg by mouth as needed. Patient not taking: No sig reported    [provider]  escitalopram (LEXAPRO) 20 MG tablet Take 20 mg by mouth at bedtime. Take 1/2 table by mouth daily 10/26/14   [provider]  ferrous sulfate 325 (65 FE) MG tablet Take by mouth.    [provider]  gabapentin (NEURONTIN) 400 MG capsule Take 400 mg by mouth 4 (four) times daily.     [provider]  Galcanezumab-gnlm (EMGALITY, 300 MG DOSE,) 100 MG/ML SOSY Inject 300 mg into the skin every 30 (thirty) days.    [provider]  lansoprazole (PREVACID) 15 MG capsule Take 15 mg by mouth daily.    [provider]  memantine (NAMENDA) 10 MG tablet Take by mouth. Take 20 mg in the morning and 10 mg in the late afternoon 10/28/17   [provider]  MISC NATURAL PRODUCTS PO Take by mouth daily. GLIACIN    [provider]  Multiple Vitamins-Minerals (CENTRUM SILVER ADULT 50+ PO) Take by mouth.     [provider]  Na Sulfate-K Sulfate-Mg Sulf (SUPREP BOWEL PREP KIT) 17.5-3.13-1.6 GM/177ML SOLN Take 1 kit by mouth as directed. 09/21/20   Milus Banister, MD  nicotine (NICODERM CQ - DOSED IN MG/24 HOURS) 21 mg/24hr patch Place 21 mg onto the skin daily.    [provider]  OnabotulinumtoxinA (BOTOX IJ) Inject as directed every 3 (three) months.    [provider]  simvastatin (ZOCOR) 20 MG tablet Take 20 mg by mouth at bedtime. 04/14/20   [provider]    Allergies  Tetracycline, Prednisone, Sulfamethoxazole-trimethoprim, Effexor [venlafaxine hcl], and Venlafaxine  Review of Systems   Review of Systems  Constitutional:  Positive for activity change.  Respiratory:  Positive for shortness of breath.   Cardiovascular:  Positive for chest pain.  All other systems reviewed and are negative.  Physical Exam Updated Vital Signs BP (!) 157/74   Pulse (!) 59   Temp 97.8 F (36.6 C) (Oral)   Resp 16   SpO2 94%   Physical Exam Vitals and nursing note reviewed.  Constitutional:      Appearance: He is well-developed.  HENT:     Head: Atraumatic.  Cardiovascular:     Rate and Rhythm: Normal rate.  Pulmonary:     Effort: Pulmonary effort is normal.  Musculoskeletal:     Cervical back: Neck supple.  Skin:    General: Skin is warm.  Neurological:     Mental Status: He is alert and oriented to person, place, and time.    ED Results / Procedures / Treatments   Labs (all labs ordered are listed, but only abnormal results are displayed) Labs Reviewed  BASIC METABOLIC PANEL - Abnormal; Notable for the following components:      Result Value   Glucose, Bld 115 (*)    All other components within normal limits  CBC  TROPONIN I (HIGH SENSITIVITY)  TROPONIN I (HIGH SENSITIVITY)    EKG EKG Interpretation  Date/Time:  Wednesday October 12 2020 12:26:27 EDT Ventricular Rate:  67 PR Interval:  150 QRS Duration: 93 QT Interval:  393 QTC  Calculation: 415 R Axis:   90 Text Interpretation: Sinus rhythm Borderline right axis deviation Probable anteroseptal infarct, old minimal ST elevation in anterior leads new inferior q waves - likely not acute Confirmed by Varney Biles [phone]) on 10/12/2020 3:20:44 PM  Radiology No results found.  Procedures Procedures   Medications Ordered in ED Medications  aspirin chewable tablet 324 mg (324 mg Oral Given 10/12/20 1241)    ED Course  I have reviewed the triage vital signs and the nursing notes.  Pertinent labs & imaging results that were available during my care of the patient were reviewed by me and considered in my medical decision making (see chart for details).  Clinical Course as of 10/14/20 1606  Wed Oct 12, 2020  1812 Troponin I (High Sensitivity): 9 [AN]  1812 Troponins are reassuring.  Patient reports no worsening of the pain.  I discussed the case with Dr. Claiborne Billings, cardiology as patient has a hear score of 5 and EKG does have some subtle ST elevations, concerns for ischemia as the underlying cause.  Dr. Claiborne Billings recommended that if the troponins remain flat, then patient can be seen in the clinic with strict ER return precautions.  Patient had informed me earlier, that he prefers outpatient follow-up, as his pain has pretty much resolved now.  Results discussed with the patient along with the recommendation from cardiology -stable for discharge. They are comfortable with the plan. [AN]    Clinical Course User Index [AN] Varney Biles, MD   MDM Rules/Calculators/A&P HEAR Score: 27                        65 year old male with about 60 pack smoking history, hypertension, hyperlipidemia comes in a chief complaint of chest pain.  He reports that he also has emphysema and anxiety, however the last few days he has had intermittent episodes of left-sided chest pressure with radiation to the neck  and some arm discomfort.  The symptoms became more pronounced yesterday after an  argument and again this morning he had that symptom, prompting him to come to the ER.  He is quite aware that the symptoms are different than his GERD and anxiety.    Final Clinical Impression(s) / ED Diagnoses Final diagnoses:  Nonspecific chest pain  Precordial chest pain    Rx / DC Orders ED Discharge Orders          Ordered    aspirin 81 MG chewable tablet  Daily        10/12/20 1803             Varney Biles, MD 10/14/20 1607

## 2020-10-12 NOTE — Telephone Encounter (Signed)
Received request from [employer] to complete ePA for Botox. Completed ePA via CMM. Received approval. [id number] (10/12/20- 01/12/2021).

## 2020-10-12 NOTE — ED Provider Notes (Signed)
Emergency Medicine Provider Triage Evaluation Note  Kyle Walters 65 y.o. male was evaluated in triage.  Pt complains of chest pain that began last night.  He states that it is in the midsternal/left-sided chest pain and radiates to his jaw and left arm.  Describes it as a pressure.  States he is currently having chest pain that he rates a 2/10.  No difficulty breathing, nausea/vomiting.  He does not take any aspirin.  He is scheduled to get an echo next week and was told that he may have had a prior MI.  He does not currently smoke.   Review of Systems  Positive: Chest pain Negative: Nausea/vomiting  Physical Exam  BP 134/82   Pulse 70   Temp 98.2 F (36.8 C) (Oral)   Resp 18   Ht 5\' 4"  (1.626 m)   Wt 65.8 kg   SpO2 100%   BMI 24.89 kg/m  Gen:   Awake, no distress   HEENT:  Atraumatic  Resp:  Normal effort  Cardiac:  Normal rate.  2+ radial pulses bilaterally. Abd:   Nondistended, nontender  MSK:   Moves extremities without difficulty  Neuro:  Speech clear   Other:      Medical Decision Making  Medically screening exam initiated at 12:31 PM  Appropriate orders placed.  Kyle Walters was informed that the remainder of the evaluation will be completed by another provider, this initial triage assessment does not replace that evaluation. They are counseled that they will need to remain in the ED until the completion of their workup, including full H&P and results of any tests.  Risks of leaving the emergency department prior to completion of treatment were discussed. Patient was advised to inform ED staff if they are leaving before their treatment is complete. The patient acknowledged these risks and time was allowed for questions.     The patient appears stable so that the remainder of the MSE may be completed by another provider.  12:32 PM: notified charge RN that patient would need immediate placement.    Clinical Impression  Chest pain   Portions of this note were  generated with Dragon dictation software. Dictation errors may occur despite best attempts at proofreading.     Volanda Napoleon, PA-C 10/12/20 1232    Valarie Merino, MD 10/12/20 1500

## 2020-10-12 NOTE — Discharge Instructions (Addendum)
We saw you in the ER for the chest pain/shortness of breath. All of our cardiac workup is normal, including labs, and chest X-RAY are normal. We are not sure what is causing your discomfort, but we feel comfortable sending you home at this time. The workup in the ER is not complete, and you should follow up with CARDIOLOGIST as request.  Please return to the ER if you have worsening chest pain, shortness of breath, pain radiating to your jaw, shoulder, or back, sweats or fainting. Otherwise see the Cardiologist as requested.

## 2020-10-12 NOTE — ED Triage Notes (Signed)
Pt complains of chest pain, left arm pain radiating into throat since last night. Has echocardiogram scheduled for next Monday, had CT scan showing bulging on left ventricle a couple of weeks ago.

## 2020-10-13 NOTE — Telephone Encounter (Signed)
Patient's next Botox appointment is 9/20. I called [facility] at [phone] and spoke with [place] to schedule Botox delivery. Botox TBD 6/28.

## 2020-10-17 ENCOUNTER — Other Ambulatory Visit: Payer: Self-pay

## 2020-10-17 ENCOUNTER — Ambulatory Visit (INDEPENDENT_AMBULATORY_CARE_PROVIDER_SITE_OTHER): Payer: Medicare Other | Admitting: Interventional Cardiology

## 2020-10-17 ENCOUNTER — Ambulatory Visit (HOSPITAL_COMMUNITY): Payer: Medicare Other | Attending: Cardiovascular Disease

## 2020-10-17 ENCOUNTER — Encounter: Payer: Self-pay | Admitting: Interventional Cardiology

## 2020-10-17 ENCOUNTER — Encounter (HOSPITAL_COMMUNITY): Payer: Self-pay | Admitting: Radiology

## 2020-10-17 VITALS — BP 122/84 | HR 67 | Ht 66.5 in | Wt 197.2 lb

## 2020-10-17 DIAGNOSIS — R931 Abnormal findings on diagnostic imaging of heart and coronary circulation: Secondary | ICD-10-CM

## 2020-10-17 DIAGNOSIS — I513 Intracardiac thrombosis, not elsewhere classified: Secondary | ICD-10-CM | POA: Diagnosis not present

## 2020-10-17 DIAGNOSIS — R072 Precordial pain: Secondary | ICD-10-CM

## 2020-10-17 DIAGNOSIS — R9431 Abnormal electrocardiogram [ECG] [EKG]: Secondary | ICD-10-CM | POA: Diagnosis not present

## 2020-10-17 DIAGNOSIS — I252 Old myocardial infarction: Secondary | ICD-10-CM | POA: Diagnosis not present

## 2020-10-17 LAB — ECHOCARDIOGRAM COMPLETE
Area-P 1/2: 2.95 cm2
S' Lateral: 2.8 cm

## 2020-10-17 MED ORDER — QULIPTA 60 MG PO TABS: 60.0000 mg | ORAL_TABLET | Freq: Every day | ORAL | 6 refills | Status: DC

## 2020-10-17 MED ORDER — METOPROLOL TARTRATE 50 MG PO TABS: ORAL_TABLET | ORAL | 0 refills | Status: DC

## 2020-10-17 MED ORDER — APIXABAN 5 MG PO TABS: 5.0000 mg | ORAL_TABLET | Freq: Two times a day (BID) | ORAL | 6 refills | Status: DC

## 2020-10-17 MED ORDER — PERFLUTREN LIPID MICROSPHERE
1.0000 mL | INTRAVENOUS | Status: AC | PRN
  Administered 2020-10-17: 2 mL via INTRAVENOUS

## 2020-10-17 NOTE — Telephone Encounter (Signed)
Qulipta prescription printed and awaiting MD signature before faxing to [facility].

## 2020-10-17 NOTE — Progress Notes (Signed)
CRITICAL VALUE STICKER  RECEIVER (on-site recipient of call):Dr. Felts Mills NOTIFIED: 2:05 pm 10/17/20  MESSENGER (representative from lab): Lenard Galloway  MD NOTIFIED: [place]: 2:05 pm  RESPONSE:  Patient was/is being seen by Dr. Irish Lack

## 2020-10-17 NOTE — Patient Instructions (Addendum)
Medication Instructions:  Your physician has recommended you make the following change in your medication: Start Eliquis 5 mg by mouth twice daily Stop Aspirin Stop Gliacin  *If you need a refill on your cardiac medications before your next appointment, please call your pharmacy*   Lab Work: Lab work to be done today--BMP If you have labs (blood work) drawn today and your tests are completely normal, you will receive your results only by: [place] (if you have MyChart) OR A paper copy in the mail If you have any lab test that is abnormal or we need to change your treatment, we will call you to review the results.   Testing/Procedures: Your physician has requested that you have cardiac CT. Cardiac computed tomography (CT) is a painless test that uses an x-ray machine to take clear, detailed pictures of your heart. For further information please visit [email or web address]. Please follow instruction sheet as given.     Follow-Up: At [facility], you and your health needs are our priority.  As part of our continuing mission to provide you with exceptional heart care, we have created designated Provider Care Teams.  These Care Teams include your primary Cardiologist (physician) and Advanced Practice Providers (APPs -  Physician Assistants and Nurse Practitioners) who all work together to provide you with the care you need, when you need it.  We recommend signing up for the patient portal called "MyChart".  Sign up information is provided on this After Visit Summary.  MyChart is used to connect with patients for Virtual Visits (Telemedicine).  Patients are able to view lab/test results, encounter notes, upcoming appointments, etc.  Non-urgent messages can be sent to your provider as well.   To learn more about what you can do with MyChart, go to [email or web address].    Your next appointment:   Based on CT results  The format for your next appointment:   In Person  Provider:    You may see Larae Grooms, MD or one of the following Advanced Practice Providers on your designated Care Team:   Melina Copa, PA-C Ermalinda Barrios, PA-C   Other Instructions   Your cardiac CT will be scheduled at one of the below locations:   [facility] [address] [place], [place] [address] [phone]  [place] [address] [place], [place] [address] [phone]  If scheduled at [facility], please arrive at the [facility] main entrance (entrance A) of [facility] 30 minutes prior to test start time. Proceed to the [facility] Radiology Department (first floor) to check-in and test prep.  If scheduled at [facility], please arrive 15 mins early for check-in and test prep.  Please follow these instructions carefully (unless otherwise directed):  Hold all erectile dysfunction medications at least 3 days (72 hrs) prior to test.  On the Night Before the Test: Be sure to Drink plenty of water. Do not consume any caffeinated/decaffeinated beverages or chocolate 12 hours prior to your test. Do not take any antihistamines 12 hours prior to your test.   On the Day of the Test: Drink plenty of water until 1 hour prior to the test. Do not eat any food 4 hours prior to the test. You may take your regular medications prior to the test.  Take metoprolol (Lopressor) two hours prior to test.  Dose is 50 mg HOLD Furosemide/Hydrochlorothiazide morning of the test.  After the Test: Drink plenty of water. After receiving IV contrast, you may experience a mild flushed feeling. This is normal. On occasion, you may experience a mild rash up to 24 hours after the test. This is not dangerous. If this occurs, you can take Benadryl 25 mg and increase your fluid intake. If you experience trouble breathing, this can be serious. If it is severe call 911 IMMEDIATELY. If  it is mild, please call our office. If you take any of these medications: Glipizide/Metformin, Avandament, Glucavance, please do not take 48 hours after completing test unless otherwise instructed.   Once we have confirmed authorization from your insurance company, we will call you to set up a date and time for your test. Based on how quickly your insurance processes prior authorizations requests, please allow up to 4 weeks to be contacted for scheduling your Cardiac CT appointment. Be advised that routine Cardiac CT appointments could be scheduled as many as 8 weeks after your provider has ordered it.  For non-scheduling related questions, please contact the cardiac imaging nurse navigator should you have any questions/concerns: Marchia Bond, Cardiac Imaging Nurse Navigator Gordy Clement, Cardiac Imaging Nurse Navigator [facility] Direct Office Dial: [phone]   For scheduling needs, including cancellations and rescheduling, please call [place], [phone].

## 2020-10-17 NOTE — Progress Notes (Signed)
Patient ID: Kyle Walters, male   DOB: 1955/07/02, 65 y.o.   MRN: [id number] CRITICAL VALUE STICKER   RECEIVER (on-site recipient of call):Dr. Van Buren NOTIFIED: 2:05 pm 10/17/20   MESSENGER (representative from lab): Lenard Galloway   MD NOTIFIED: [place]: 2:05 pm   RESPONSE:  Patient was/is being seen by Dr. Irish Lack

## 2020-10-17 NOTE — Progress Notes (Addendum)
Cardiology Office Note   Date:  10/17/2020   ID:  Kyle Walters, DOB 1956/04/16, MRN [id number]  PCP:  Elwyn Reach, MD    No chief complaint on file.  LV thrombus  Wt Readings from Last 3 Encounters:  10/17/20 197 lb 3.2 oz (89.4 kg)  09/26/20 198 lb 6.4 oz (90 kg)  09/21/20 198 lb (89.8 kg)       History of Present Illness: Kyle Walters is a 65 y.o. male who is being seen today for the evaluation of LV apical thrombus at the request of Elwyn Reach, MD.  Patient had a lung cancer screening CT.  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  He was set up for echocardiogram which was done today in our office.  This revealed an LV apical aneurysm and there appeared to be thrombus in the aneurysm with Definity contrast injection.  He had a stress test many years ago no prior catheterization or known MI that he has had.  He does have anxiety and panic issues and has had chest pain on multiple occasions.  He actually went to the emergency room due to anxiety causing chest pain, from reading the CT scan report.  His troponin in the ER was normal.  He was sent home.    He denies any exertional angina.  His exercise level has dropped off in the last few years.  He has migraines stimulated by any type of smell.  This has limited the amount that he goes outside.  Of note, his ECG done in the ER appeared to show poor R wave progression.  When I look back at prior ECG from 2017, he had normal R wave progression.  No other cardiac testing in that time.  Currently, he feels well.  Denies : Exertional chest pain. Dizziness. Leg edema. Nitroglycerin use. Orthopnea. Palpitations. Paroxysmal nocturnal dyspnea. Shortness of breath. Syncope.      Past Medical History:  Diagnosis Date   ADHD (attention deficit hyperactivity disorder)    Anemia    Anxiety panic attack   Arthritis    COPD (chronic obstructive pulmonary disease) (HCC)    bronchitis/emphysema   Depression    Emphysema  lung (HCC)    Enlarged prostate    Fibromyalgia    GERD (gastroesophageal reflux disease)    Hyperlipidemia    Hypertension    IBS (irritable bowel syndrome)    Migraine    Panic attack     Past Surgical History:  Procedure Laterality Date   NO PAST SURGERIES       Current Outpatient Medications  Medication Sig Dispense Refill   albuterol (PROAIR HFA) 108 (90 Base) MCG/ACT inhaler Inhale 2 puffs into the lungs every 6 (six) hours as needed for wheezing or shortness of breath. 8 g 3   ALPRAZolam (XANAX) 1 MG tablet Take 1 mg by mouth 4 (four) times daily.      aspirin 81 MG chewable tablet Chew 1 tablet (81 mg total) by mouth daily. 30 tablet 0   Atogepant (QULIPTA) 60 MG TABS Take 60 mg by mouth daily. 30 tablet 6   Botulinum Toxin Type A (BOTOX) 200 units SOLR Provider to inject 155 units into the muscles of the head and neck every 3 months. Discard remainder. 1 each 3   BREO ELLIPTA 100-25 MCG/INH AEPB INHALE 1 PUFF BY MOUTH EVERY DAY 60 each 0   Cholecalciferol (VITAMIN D3) 2000 UNITS TABS Take 2 tablets by mouth as needed.  doxazosin (CARDURA) 8 MG tablet Take 8 mg by mouth daily.     escitalopram (LEXAPRO) 20 MG tablet Take 20 mg by mouth at bedtime. Take 1/2 table by mouth daily  2   ferrous sulfate 325 (65 FE) MG tablet Take by mouth.     gabapentin (NEURONTIN) 400 MG capsule Take 400 mg by mouth 4 (four) times daily.      Galcanezumab-gnlm (EMGALITY, 300 MG DOSE,) 100 MG/ML SOSY Inject 300 mg into the skin every 30 (thirty) days.     lansoprazole (PREVACID) 15 MG capsule Take 15 mg by mouth daily.     memantine (NAMENDA) 10 MG tablet Take by mouth. Take 20 mg in the morning and 10 mg in the late afternoon  6   MISC NATURAL PRODUCTS PO Take by mouth daily. GLIACIN     Multiple Vitamins-Minerals (CENTRUM SILVER ADULT 50+ PO) Take by mouth.     nicotine (NICODERM CQ - DOSED IN MG/24 HOURS) 14 mg/24hr patch Place 14 mg onto the skin daily.     simvastatin (ZOCOR) 20 MG  tablet Take 20 mg by mouth at bedtime.     No current facility-administered medications for this visit.   Facility-Administered Medications Ordered in Other Visits  Medication Dose Route Frequency Provider Last Rate Last Admin   perflutren lipid microspheres (DEFINITY) IV suspension  1-10 mL Intravenous PRN [name], NP   2 mL at 10/17/20 1410    Allergies:   Tetracycline, Prednisone, Sulfamethoxazole-trimethoprim, Effexor [venlafaxine hcl], and Venlafaxine    Social History:  The patient  reports that he quit smoking about 7 years ago. His smoking use included cigarettes. He has a 60.00 pack-year smoking history. He has never used smokeless tobacco. He reports that he does not drink alcohol and does not use drugs.   Family History:  The patient's family history includes Bladder Cancer in his father; Breast cancer in his mother; Diabetes in his father and mother; Hypertension in his brother and father; Irritable bowel syndrome in his father; Migraines in his father; Osteoarthritis in his mother; Other in his brother; Pancreatic cancer in his father; Stroke in his father and mother.    ROS:  Please see the history of present illness.   Otherwise, review of systems are positive for anxiety/panic.  All other systems are reviewed and negative.    PHYSICAL EXAM: VS:  BP 122/84   Pulse 67   Ht 5' 6.5" (1.689 m)   Wt 197 lb 3.2 oz (89.4 kg)   SpO2 93%   BMI 31.35 kg/m  , BMI Body mass index is 31.35 kg/m. GEN: Well nourished, well developed, in no acute distress HEENT: normal Neck: no JVD, carotid bruits, or masses Cardiac: RRR; no murmurs, rubs, or gallops,; trace pretibial edema  Respiratory:  clear to auscultation bilaterally, normal work of breathing GI: soft, nontender, nondistended, + BS MS: no deformity or atrophy Skin: warm and dry, no rash Neuro:  Strength and sensation are intact Psych: euthymic mood, full affect   EKG:   The ekg ordered in June 2022 demonstrates  normal sinus rhythm, poor R wave progression which is a new finding compared to 2017 ECG   Recent Labs: 10/12/2020: BUN 16; Creatinine, Ser 1.12; Hemoglobin 14.6; Platelets 245; Potassium 4.2; Sodium 136   Lipid Panel No results found for: CHOL, TRIG, HDL, CHOLHDL, VLDL, LDLCALC, LDLDIRECT   Other studies Reviewed: Additional studies/ records that were reviewed today with results demonstrating: ER records reviewed, I personally looked at  the echo images, labs reviewed.   ASSESSMENT AND PLAN:  LV apical thrombus: Unclear how old this finding is.  It appears he has had some type of cardiac event between 2017 and 2022.  We will plan to anticoagulate him for 3 months with Eliquis 5 mg twice daily. GERD: He was scheduled to have EGD colonoscopy done at the end of August.  We will have to postpone this while he is on anticoagulation. Old MI: Based on ECG and echo findings, it appears he has had an old MI.  He will need to have his coronary anatomy evaluated.  We will plan for coronary CTA since he is going to be anticoagulated at this point.  Metoprolol 50 mg x 1.  He had some precordial chest pain prior to ER visit..  I suspect he will have an occlusion of his distal LAD.  Hopefully, there is no other hemodynamically significant CAD.  Apical akinesis noted on the echocardiogram as well.  Overall, his LV function appears mildly decreased.  After he gets his CTA with contrast, we will add ACE inhibitor and beta-blocker as tolerated.  Blood pressure currently is normal so this may be a limitation. Stop aspirin   Current medicines are reviewed at length with the patient today.  The patient concerns regarding his medicines were addressed.  The following changes have been made: Start Eliquis  Labs/ tests ordered today include:  No orders of the defined types were placed in this encounter.   Recommend 150 minutes/week of aerobic exercise Low fat, low carb, high fiber diet recommended  Disposition:    FU after CTA   Signed, Larae Grooms, MD  10/17/2020 3:53 PM    [facility] [place], [place], [place]  [address] Phone: [phone]; Fax: [phone]

## 2020-10-17 NOTE — Telephone Encounter (Signed)
Qulipta 60 mg prescription signed by Dr Jaynee Eagles and faxed to [facility]. Received a receipt of confirmation.

## 2020-10-18 LAB — BASIC METABOLIC PANEL
BUN/Creatinine Ratio: 14 (ref 10–24)
BUN: 16 mg/dL (ref 8–27)
CO2: 26 mmol/L (ref 20–29)
Calcium: 9.8 mg/dL (ref 8.6–10.2)
Chloride: 102 mmol/L (ref 96–106)
Creatinine, Ser: 1.12 mg/dL (ref 0.76–1.27)
Glucose: 99 mg/dL (ref 65–99)
Potassium: 4.8 mmol/L (ref 3.5–5.2)
Sodium: 141 mmol/L (ref 134–144)
eGFR: 73 mL/min/{1.73_m2} (ref >59)

## 2020-10-18 NOTE — Telephone Encounter (Signed)
Received (1) 200 unit vial of Botox today from [facility]. 

## 2020-10-19 ENCOUNTER — Other Ambulatory Visit: Payer: Self-pay | Admitting: Neurology

## 2020-10-19 MED ORDER — UBRELVY 100 MG PO TABS
ORAL_TABLET | ORAL | 3 refills | Status: DC
Start: 2020-10-19 — End: 2020-10-19

## 2020-10-20 ENCOUNTER — Telehealth: Payer: Self-pay | Admitting: *Deleted

## 2020-10-20 NOTE — Telephone Encounter (Signed)
Completed Roselyn Meier PA on Cover My Med[employer]. Key: [id number]. Awaiting determination from [facility] Medicare Part D.

## 2020-10-20 NOTE — Telephone Encounter (Signed)
Completed Qulipta PA on Cover My Meds. Key: [id number]. Awaiting determination from [facility]. Key: [id number] - PA Case ID: [id number]  This likely will not get approved but we need the denial so the patient can apply to the [place] savings program.  The prescription is ready to send but needs to include denial letter with it.

## 2020-10-25 NOTE — Telephone Encounter (Signed)
Awaiting PA determination. We will be faxing to the [facility] team after ppw done. I called the wife and LVM letting her know we will fax when ready.

## 2020-10-25 NOTE — Telephone Encounter (Signed)
Pt's wife, Blanchard Kelch (on [place]) called, have you received form dropped off Thursday, 6/30. Will you be faxing form to [employer] or do I need to pick up to fax with entire application. Would like a call from the nurse.

## 2020-10-25 NOTE — Telephone Encounter (Signed)
Pt's wife, [name] called, do my husband fax in the remaining pages separately? Would like a call from the nurse.

## 2020-10-26 NOTE — Telephone Encounter (Signed)
I called pt and he received denial for the quilipta via text from the pharmacy. We have not received the denial from [facility] on letterhead.  Made copy of denial from [facility].  I relayed that they can pick everything we have so they can fax it themselves.  He wanted to do that.   Will pick up at there convenience, not Friday.  He appreciated call back.

## 2020-10-27 ENCOUNTER — Telehealth: Payer: Self-pay | Admitting: Neurology

## 2020-10-27 NOTE — Telephone Encounter (Signed)
Spoke with patient's wife and let her know that we received the Qulipta denial and Roselyn Meier has been approved. All information for application is ready to be picked up at front desk. She verbalized appreciation and her questions were answered.

## 2020-10-27 NOTE — Telephone Encounter (Signed)
Thanks. No further suggestions at this time as the program they are applying for may be able to help with [place].

## 2020-10-27 NOTE — Telephone Encounter (Signed)
Patient picked up paperwork. Patient wants you to know the Kathrin Ruddy was approved co pay is $250 per month, Patient will also be applying for Qlipta to see what other options are out there for him at a lower copay. If you have any other suggestions please give his wife a call. Thank you

## 2020-10-27 NOTE — Telephone Encounter (Signed)
Request Reference Number: [id number]. UBRELVY TAB 100MG  is approved through 04/22/2021. Your patient may now fill this prescription and it will be covered.

## 2020-10-28 ENCOUNTER — Other Ambulatory Visit (HOSPITAL_COMMUNITY): Payer: Self-pay | Admitting: *Deleted

## 2020-10-28 ENCOUNTER — Telehealth: Payer: Self-pay | Admitting: Interventional Cardiology

## 2020-10-28 ENCOUNTER — Telehealth (HOSPITAL_COMMUNITY): Payer: Self-pay | Admitting: *Deleted

## 2020-10-28 MED ORDER — METOPROLOL TARTRATE 50 MG PO TABS
ORAL_TABLET | ORAL | 0 refills | Status: DC
Start: 2020-10-28 — End: 2020-10-28

## 2020-10-28 MED ORDER — METOPROLOL TARTRATE 50 MG PO TABS: ORAL_TABLET | ORAL | 0 refills | Status: DC

## 2020-10-28 NOTE — Telephone Encounter (Signed)
Pt c/o medication issue:  1. Name of Medication: Metoprolol Tartrate 50 MG   2. How are you currently taking this medication (dosage and times per day)?   3. Are you having a reaction (difficulty breathing--STAT)?   4. What is your medication issue? Patient states the pharmacy ([id number] [place] [place]) did not get the medication order for this. He does not have the medication to take before his CT scan

## 2020-10-28 NOTE — Telephone Encounter (Signed)
Patient returning phone call regarding upcoming cardiac imaging study; pt verbalizes understanding of appt date/time, parking situation and where to check in, pre-test NPO status and medications ordered, and verified current allergies; name and call back number provided for further questions should they arise  Gordy Clement RN [facility] [phone] office [phone] cell  Patient stated he need another prescription for one time dose of 50mg  metoprolol tartrate for test. Prescription sent again to pharmacy. He is aware to take medication 2 hours prior to cardiac CT scan.

## 2020-10-28 NOTE — Telephone Encounter (Signed)
Attempted to call patient regarding upcoming cardiac CT appointment. °Left message on voicemail with name and callback number ° °Jonique Kulig RN Navigator Cardiac Imaging °[facility] °[phone] Office °[phone] Cell ° °

## 2020-10-31 ENCOUNTER — Other Ambulatory Visit: Payer: Self-pay

## 2020-10-31 ENCOUNTER — Ambulatory Visit (HOSPITAL_COMMUNITY)
Admission: RE | Admit: 2020-10-31 | Discharge: 2020-10-31 | Disposition: A | Discharge status: Home / Self Care | Payer: Medicare Other | Source: Ambulatory Visit | Attending: Interventional Cardiology | Admitting: Interventional Cardiology

## 2020-10-31 DIAGNOSIS — R079 Chest pain, unspecified: Secondary | ICD-10-CM | POA: Diagnosis not present

## 2020-10-31 DIAGNOSIS — I6522 Occlusion and stenosis of left carotid artery: Secondary | ICD-10-CM | POA: Insufficient documentation

## 2020-10-31 DIAGNOSIS — I513 Intracardiac thrombosis, not elsewhere classified: Secondary | ICD-10-CM | POA: Diagnosis not present

## 2020-10-31 DIAGNOSIS — I2510 Atherosclerotic heart disease of native coronary artery without angina pectoris: Secondary | ICD-10-CM | POA: Diagnosis not present

## 2020-10-31 DIAGNOSIS — I253 Aneurysm of heart: Secondary | ICD-10-CM | POA: Insufficient documentation

## 2020-10-31 MED ORDER — NITROGLYCERIN 0.4 MG SL SUBL
SUBLINGUAL_TABLET | SUBLINGUAL | Status: AC
  Filled 2020-10-31: qty 2

## 2020-10-31 MED ORDER — NITROGLYCERIN 0.4 MG SL SUBL
0.8000 mg | SUBLINGUAL_TABLET | Freq: Once | SUBLINGUAL | Status: AC
  Administered 2020-10-31: 0.8 mg via SUBLINGUAL

## 2020-10-31 MED ORDER — IOHEXOL 350 MG/ML SOLN
95.0000 mL | Freq: Once | INTRAVENOUS | Status: AC | PRN
  Administered 2020-10-31: 95 mL via INTRAVENOUS

## 2020-11-02 ENCOUNTER — Telehealth: Payer: Self-pay | Admitting: Interventional Cardiology

## 2020-11-02 NOTE — Telephone Encounter (Signed)
Spoke with the patient and gave her recommendations per Marcelle Overlie. Patient verbalized understanding.

## 2020-11-02 NOTE — Telephone Encounter (Signed)
Patient would like to know if he is able to take Flexeril with his other medications and heart conditions.

## 2020-11-02 NOTE — Telephone Encounter (Addendum)
Ok to take. Please make pt aware this medication can cause drowsiness. This can be an additive effect with his alprazolam and gabapentin.   Can cause serotonin symdrom with escitalopram-rare but watch out for tremors, change in mental status

## 2020-11-02 NOTE — Telephone Encounter (Signed)
Pt is calling to confirm he can take a muscle relaxer with his condition

## 2020-11-03 NOTE — Telephone Encounter (Signed)
Received call from [facility] with [facility] pt PA denial for quilipta, no copay card available since pt over 59yrs old.  [employer] PAP is in process for pt. [phone].

## 2020-11-07 ENCOUNTER — Other Ambulatory Visit: Payer: Self-pay | Admitting: Emergency Medicine

## 2020-11-09 ENCOUNTER — Ambulatory Visit: Payer: Medicare Other | Admitting: Cardiology

## 2020-11-14 ENCOUNTER — Telehealth: Payer: Self-pay | Admitting: Interventional Cardiology

## 2020-11-14 NOTE — Telephone Encounter (Signed)
*  STAT* If patient is at the pharmacy, call can be transferred to refill team.   1. Which medications need to be refilled? (please list name of each medication and dose if known) apixaban (ELIQUIS) 5 MG TABS tablet  2. Which pharmacy/location (including street and city if local pharmacy) is medication to be sent to? [id number] [place] [place] [facility] [address]  3. Do they need a 30 day or 90 day supply? 30 day   Patient is completely out of medication.

## 2020-11-14 NOTE — Telephone Encounter (Signed)
Eliquis rx was sent in on 10/17/20 for 60 tablets with 6 refills.  [facility] spoke with Rob the pharmacist gave verbal rx because they had no record of above rx.

## 2020-11-28 MED ORDER — MEMANTINE HCL 10 MG PO TABS: ORAL_TABLET | ORAL | 0 refills | Status: DC

## 2020-11-28 NOTE — Addendum Note (Signed)
Addended by: Gildardo Griffes on: 11/28/2020 09:18 AM   Modules accepted: Orders

## 2020-11-29 ENCOUNTER — Telehealth: Payer: Self-pay | Admitting: Interventional Cardiology

## 2020-11-29 NOTE — Telephone Encounter (Signed)
Pt c/o medication issue:  1. Name of Medication:  Benadryl  Reglan   2. How are you currently taking this medication (dosage and times per day)? Not currently taking   3. Are you having a reaction (difficulty breathing--STAT)? No  4. What is your medication issue? Wanting to know if he can take these two medications for migraines. States he would have to get it at the ED by IV reports it is called a migraine cocktail.  Requesting detailed message be left if he does not answer.

## 2020-11-29 NOTE — Telephone Encounter (Signed)
Called pt in regards to migraine cocktail.  I notified him that we don't prescribe these meds.  He explained that he wants to know if these meds will interact with heart meds.  I will route message to pharmacy to review compatibility.

## 2020-11-30 NOTE — Telephone Encounter (Signed)
Patient notified

## 2020-11-30 NOTE — Telephone Encounter (Signed)
Ok to take these meds, main cardiac concern would be potential for QTc prolongation with Reglan but his EKG from June is stable.

## 2020-12-08 DIAGNOSIS — M542 Cervicalgia: Secondary | ICD-10-CM | POA: Diagnosis not present

## 2020-12-16 ENCOUNTER — Encounter: Payer: Medicare Other | Admitting: Gastroenterology

## 2020-12-21 DIAGNOSIS — M542 Cervicalgia: Secondary | ICD-10-CM | POA: Diagnosis not present

## 2020-12-22 ENCOUNTER — Ambulatory Visit: Payer: Medicare Other | Admitting: Neurology

## 2021-01-04 ENCOUNTER — Telehealth: Payer: Self-pay | Admitting: *Deleted

## 2021-01-04 NOTE — Telephone Encounter (Signed)
Per chart patient is on blood thinner and he is to follow up with cardiologist and needs cath? I called patient to see what is going on with his heart. No answer, left a message for him to call me back.

## 2021-01-04 NOTE — Telephone Encounter (Signed)
Patient called back- I gave him Dr.Jacobs recommendations. Patient aware we need cardiac clearance and for the patient to call us back after cardiac test/cath. are completed and he gets the clearance to have procedure from the cardiologist. Patient is also on a blood thinner at this time- he is aware he would need to be off this medication if able per cardiologist.   Procedure and nurse visit cancelled-pt is aware.

## 2021-01-04 NOTE — Telephone Encounter (Signed)
Dr.Jacobs,  Please review. Looks like after his OV with you on 09/21/20 he went to the ED on 10/12/20 for chest pain. He had echo on 10/17/20 EF 35-40% with LV mural thrombus-he was placed on Eliquis. In the chart I see notes that state he needs an echo with bubble ? PFO, and he has a follow up with cardiologist on 10/7-under the cardiologist notes states he need cath. His ECL with you was rescheduled for 01/30/21. Please review, okay to proceed? Thank you, Bartholomew Ramesh pv

## 2021-01-04 NOTE — Telephone Encounter (Signed)
Called patient. No answer, left a message for the patient to call us back.

## 2021-01-04 NOTE — Telephone Encounter (Signed)
Kyle Banister, MD  Levonne Spiller, RN Caller: Unspecified (Today,  1:21 PM) Please cancel his upcoming procedure appointment and let him know that after his work-up with cardiologist is complete including what ever tests that they recommend he have, that he should call us back and we can get him on schedule for a screening examination if that is still relevant.

## 2021-01-10 ENCOUNTER — Ambulatory Visit: Payer: Medicare Other | Admitting: Neurology

## 2021-01-10 ENCOUNTER — Encounter: Payer: Self-pay | Admitting: *Deleted

## 2021-01-10 DIAGNOSIS — G43711 Chronic migraine without aura, intractable, with status migrainosus: Secondary | ICD-10-CM | POA: Diagnosis not present

## 2021-01-10 NOTE — Progress Notes (Signed)
Consent Form Botulism Toxin Injection For Chronic Migraine  01/10/2021; second Botox. He has chronic migraines without aura and those are doing better on botox > 50% improvement in frequency. . He has episodic migraines with aura will prescribe qulipta. We see his wife [name]. Got the [place]. The nerivio helps some. He has been using it when having a new headache and the TMS helped some. Dry needling hurts but helps, he knew a PT who performs dry needling and that has helped some too.   Reviewed orally with patient, additionally signature is on file:  Botulism toxin has been approved by the Federal drug administration for treatment of chronic migraine. Botulism toxin does not cure chronic migraine and it may not be effective in some patients.  The administration of botulism toxin is accomplished by injecting a small amount of toxin into the muscles of the neck and head. Dosage must be titrated for each individual. Any benefits resulting from botulism toxin tend to wear off after 3 months with a repeat injection required if benefit is to be maintained. Injections are usually done every 3-4 months with maximum effect peak achieved by about 2 or 3 weeks. Botulism toxin is expensive and you should be sure of what costs you will incur resulting from the injection.  The side effects of botulism toxin use for chronic migraine may include:   -Transient, and usually mild, facial weakness with facial injections  -Transient, and usually mild, head or neck weakness with head/neck injections  -Reduction or loss of forehead facial animation due to forehead muscle weakness  -Eyelid drooping  -Dry eye  -Pain at the site of injection or bruising at the site of injection  -Double vision  -Potential unknown long term risks  Contraindications: You should not have Botox if you are pregnant, nursing, allergic to albumin, have an infection, skin condition, or muscle weakness at the site of the  injection, or have myasthenia gravis, Lambert-Eaton syndrome, or ALS.  It is also possible that as with any injection, there may be an allergic reaction or no effect from the medication. Reduced effectiveness after repeated injections is sometimes seen and rarely infection at the injection site may occur. All care will be taken to prevent these side effects. If therapy is given over a long time, atrophy and wasting in the muscle injected may occur. Occasionally the patient's become refractory to treatment because they develop antibodies to the toxin. In this event, therapy needs to be modified.  I have read the above information and consent to the administration of botulism toxin.    BOTOX PROCEDURE NOTE FOR MIGRAINE HEADACHE    Contraindications and precautions discussed with patient(above). Aseptic procedure was observed and patient tolerated procedure. Procedure performed by Dr. Georgia Dom  The condition has existed for more than 6 months, and pt does not have a diagnosis of ALS, Myasthenia Gravis or Lambert-Eaton Syndrome.  Risks and benefits of injections discussed and pt agrees to proceed with the procedure.  Written consent obtained  These injections are medically necessary. Pt  receives good benefits from these injections. These injections do not cause sedations or hallucinations which the oral therapies may cause.  Description of procedure:  The patient was placed in a sitting position. The standard protocol was used for Botox as follows, with 5 units of Botox injected at each site:   -Procerus muscle, midline injection  -Corrugator muscle, bilateral injection  -Frontalis muscle, bilateral injection, with 2 sites each side, medial injection was  performed in the upper one third of the frontalis muscle, in the region vertical from the medial inferior edge of the superior orbital rim. The lateral injection was again in the upper one third of the forehead vertically above the lateral  limbus of the cornea, 1.5 cm lateral to the medial injection site.  -Temporalis muscle injection, 4 sites, bilaterally. The first injection was 3 cm above the tragus of the ear, second injection site was 1.5 cm to 3 cm up from the first injection site in line with the tragus of the ear. The third injection site was 1.5-3 cm forward between the first 2 injection sites. The fourth injection site was 1.5 cm posterior to the second injection site.   -Occipitalis muscle injection, 3 sites, bilaterally. The first injection was done one half way between the occipital protuberance and the tip of the mastoid process behind the ear. The second injection site was done lateral and superior to the first, 1 fingerbreadth from the first injection. The third injection site was 1 fingerbreadth superiorly and medially from the first injection site.  -Cervical paraspinal muscle injection, 2 sites, bilateral knee first injection site was 1 cm from the midline of the cervical spine, 3 cm inferior to the lower border of the occipital protuberance. The second injection site was 1.5 cm superiorly and laterally to the first injection site.  -Trapezius muscle injection was performed at 3 sites, bilaterally. The first injection site was in the upper trapezius muscle halfway between the inflection point of the neck, and the acromion. The second injection site was one half way between the acromion and the first injection site. The third injection was done between the first injection site and the inflection point of the neck.   Will return for repeat injection in 3 months.   155 units of Botox was used, 45U Botox not injected was wasted. The patient tolerated the procedure well, there were no complications of the above procedure.

## 2021-01-10 NOTE — Progress Notes (Signed)
Botox- 200 units x 1 vial Lot: [id number] Expiration: 03/2023 NDC: 5208-0223-36  Bacteriostatic 0.9% Sodium Chloride- 53mL total Lot: [id number] Expiration: 04/23/2022 NDC: 7530-0511-02  Dx: T11.735 S/P

## 2021-01-19 ENCOUNTER — Telehealth: Payer: Self-pay

## 2021-01-19 NOTE — Telephone Encounter (Signed)
PA was started on [facility] Key: [id number]  Request Reference Number: [id number]. BOTOX INJ 200UNIT is approved through 04/20/2021. Your patient may now fill this prescription and it will be covered.

## 2021-01-23 ENCOUNTER — Telehealth: Payer: Self-pay | Admitting: *Deleted

## 2021-01-23 ENCOUNTER — Ambulatory Visit: Payer: Medicare Other | Admitting: Neurology

## 2021-01-23 ENCOUNTER — Encounter: Payer: Self-pay | Admitting: Neurology

## 2021-01-23 ENCOUNTER — Other Ambulatory Visit: Payer: Self-pay

## 2021-01-23 VITALS — BP 129/71 | HR 78 | Ht 67.0 in | Wt 196.0 lb

## 2021-01-23 DIAGNOSIS — G43711 Chronic migraine without aura, intractable, with status migrainosus: Secondary | ICD-10-CM | POA: Diagnosis not present

## 2021-01-23 NOTE — Telephone Encounter (Signed)
-----   Message from Melvenia Beam, MD sent at 01/23/2021 12:24 PM EDT ----- Regarding: Start Vytepti Can we start the Vyepti process for patient? If we could go straight to the highest 300mg  dose would be great thanks, depends on his insurance. He has failed all 3 CGRPs and qulipta shouldn;t be a problem let me know what you need thanks.

## 2021-01-23 NOTE — Progress Notes (Signed)
[facility]    Provider:  Dr Jaynee Eagles Requesting Provider: Elwyn Reach, MD Primary Care Provider:  Elwyn Reach, MD  CC:  migraines  01/23/2021: He returns today. We are performing botox on him every 12 weeks. We sent him to [facility]. We started qulipta. We also started nerivio. We see his wife [name]. Got the [place]. The nerivio helps some. He has been using it when having a new headache and the TMS helped some. Dry needling hurts but helps, he knew a PT who performs dry needling and that has helped some too. He is having memory problems. He forgets what month they are in. This can go on for months in the setting of headaches. The chronic pain bothers him less than his memory loss which he attributes to chronic pain. He has been on the [place] a few months. He was still having migraines on Qulipta but they were just a lot better. 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 The emgality was great then stopped being as great, then went up to 300, now back to 200 and no difference may just have stopped working altogether. Roselyn Meier and Nerivio have worked well.   HPI:  Kyle Walters is a 65 y.o. male here as requested by Elwyn Reach, MD for headaches.  Past medical history panic disorder, COPD, depression, GERD, severe headaches and migraines.  I reviewed Dr. Leontine Locket notes: Headaches improved in the past but now they are back. Went to [facility] and had a bad experience.  Patient has tried Imitrex and and has other medical problems including ADHD and anxiety disorder and chronic allergies to fragrance, he continues to have multiple medical problems including allergies, abdominal discomfort with severe GERD, recurrent nausea, the last time he had a good meaningful relief of his headaches was when he went to the headache clinic at the [facility].  Patient is here alone and reports he has headaches  and migraines, has had migraines since his 67s, they became severe about 9 years ago, he is on Botox and takes Emgality cluster headache dose although he denies having cluster headaches.He saw a neurologist at the [facility], then the next neurologist started working for [facility] and was replaced by nurses. He went through a bad migraine cycle in May 30th of last year, he had trigger point injections, they would not do infusions, he cannot take prednisone.He feels he has lasting side effects from prednisone.He was in the ED and he was given reglan and toradol and his migraine cycle stopped. He has BPH so cannot have compazine or benadryl. We spoke about Tardive Dyskinesias and reglan.Migraines worsened 9 years ago.Smells can trigger severe migraines.Hif wife has autism and extreme anxiety. Finger nail polish always starts a severe migraine.He has developed osmophobia and now many smells "set me off". 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 He is going to try peppermint oil to try and find a masking agent to put on his nose.His migraines causeing a lack of memory, in 2018 he went to the [place] [facility].Bright colors also bother him. He has nausea. He still has headaches almost every day, but this is still a significant improvement over prior, in 2018 he had severe daily migraines, now headaches are mild at least 80% improved in severity. He went in last week for reglan and toradol.He struggles with agoraphobia.  Reviewed  notes, labs and imaging from outside physicians, which showed:   Medications tried in the past include Emgality(worked great then stopped working, went up to 300 didn't help and now back to 200), Ajovy(Didn't work as well as others), Aimovig, Depakote, gabapentin, Topamax, memantine, Effexor, nortriptyline, beta-blockers are contraindicated due to COPD, sumatriptan, Nurtec, aspirin, Lexapro, ketorolac injections,  Mobic, Reglan injections, rizatriptan, sumatriptan hand, zonisamide.  I reviewed chart patient's been seen at the [facility] emergency room twice in the last several months, on June 11, 2020 he presented with headache and on July 05, 2020 he presented with migraine and headache.  I reviewed these records: In February he stated migraine started after triggered by a strong scent, history of neck spasms with the migraine, severe, with aura of visual loss and a small slice in his right visual field, on a "chronic migraine cycle" since last June receiving Botox injections from his neurologist, received relief after migraine cocktail.  He presented in March with headache for 6 days, throbbing, typical for his migraines no relief with current medications.  Again felt better after migraine cocktail and was discharged home.  I also reviewed [place] [facility]'s notes, history of migraines that started in his late 25s, progressed to chronic migraines in 2012 with possible cluster headache, comorbid anxiety, depression, PTSD, panic attacks, ADD and COPD.  Appears she tried Ajovy and earlier this month switch back to [facility] which was effective previously.  Blood work collected December 29, 2019 shows a CMP with BUN 14 and creatinine 1.28 otherwise unremarkable,  CT head 06/11/2020: showed No acute intracranial abnormalities including mass lesion or mass effect, hydrocephalus, extra-axial fluid collection, midline shift, hemorrhage, or acute infarction, large ischemic events (personally reviewed images)     Review of Systems: Patient complains of symptoms per HPI as well as the following symptoms: memory loss . Pertinent negatives and positives per HPI. All others negative    Social History   Socioeconomic History   Marital status: Married    Spouse name: [name]   Number of children: 1   Years of education: Not on file   Highest education level: Not on file  Occupational History   Occupation:  disabled  Tobacco Use   Smoking status: Former    Packs/day: 1.50    Years: 40.00    Pack years: 60.00    Types: Cigarettes    Quit date: 05/17/2013    Years since quitting: 7.6   Smokeless tobacco: Never   Tobacco comments:    Also using E-Cig  Vaping Use   Vaping Use: Former  Substance and Sexual Activity   Alcohol use: No    Alcohol/week: 0.0 standard drinks   Drug use: No   Sexual activity: Not on file  Other Topics Concern   Not on file  Social History Narrative   Lives at home with wife and stepson    Right handed   Caffeine: 8 oz coke per day    Social Determinants of Health   Financial Resource Strain: Not on file  Food Insecurity: Not on file  Transportation Needs: Not on file  Physical Activity: Not on file  Stress: Not on file  Social Connections: Not on file  Intimate Partner Violence: Not on file    Family History  Problem Relation Age of Onset   Breast cancer Mother    Diabetes Mother    Osteoarthritis Mother    Stroke Mother    Bladder Cancer Father    Diabetes Father  Stroke Father    Hypertension Father    Pancreatic cancer Father    Migraines Father    Irritable bowel syndrome Father    Hypertension Brother    Other Brother        stomach ulcer   Colon cancer Neg Hx    Rectal cancer Neg Hx    Esophageal cancer Neg Hx     Past Medical History:  Diagnosis Date   ADHD (attention deficit hyperactivity disorder)    Anemia    Anxiety panic attack   Arthritis    COPD (chronic obstructive pulmonary disease) (HCC)    bronchitis/emphysema   Depression    Emphysema lung (HCC)    Enlarged prostate    Fibromyalgia    GERD (gastroesophageal reflux disease)    Hyperlipidemia    Hypertension    IBS (irritable bowel syndrome)    Migraine    Panic attack     Patient Active Problem List   Diagnosis Date Noted   Chronic migraine without aura, with intractable migraine, so stated, with status migrainosus 07/14/2020   Severe episode of  recurrent major depressive disorder, without psychotic features ([place]) 07/14/2020   Polyclonal gammopathy determined by serum protein electrophoresis 02/25/2018   Primary osteoarthritis of both feet 12/13/2017   History of BPH 12/13/2017   Family history of psoriasis in mother 12/13/2017   History of gastroesophageal reflux (GERD) 12/13/2017   Anxiety and depression 12/13/2017   COPD (chronic obstructive pulmonary disease) ([place]) 01/29/2013   CHEST PAIN-PRECORDIAL 11/01/2009   Primary osteoarthritis of both hands 10/12/2009   CHEST PAIN 10/12/2009    Past Surgical History:  Procedure Laterality Date   NO PAST SURGERIES      Current Outpatient Medications  Medication Sig Dispense Refill   ALPRAZolam (XANAX) 1 MG tablet Take 1 mg by mouth 4 (four) times daily.      apixaban (ELIQUIS) 5 MG TABS tablet Take 1 tablet (5 mg total) by mouth 2 (two) times daily. 60 tablet 6   Botulinum Toxin Type A (BOTOX) 200 units SOLR Provider to inject 155 units into the muscles of the head and neck every 3 months. Discard remainder. 1 each 3   BREO ELLIPTA 100-25 MCG/INH AEPB INHALE 1 PUFF BY MOUTH EVERY DAY 60 each 10   Cholecalciferol (VITAMIN D3) 2000 UNITS TABS Take 2 tablets by mouth as needed.     doxazosin (CARDURA) 8 MG tablet Take 8 mg by mouth daily.     escitalopram (LEXAPRO) 20 MG tablet Take 20 mg by mouth at bedtime. Take 1/2 table by mouth daily  2   ferrous sulfate 325 (65 FE) MG tablet Take by mouth.     gabapentin (NEURONTIN) 400 MG capsule Take 400 mg by mouth 4 (four) times daily.      lansoprazole (PREVACID) 15 MG capsule Take 15 mg by mouth daily.     memantine (NAMENDA) 10 MG tablet Take 20 mg in the morning and 10 mg in the late afternoon 90 tablet 0   Multiple Vitamins-Minerals (CENTRUM SILVER ADULT 50+ PO) Take by mouth.     nicotine (NICODERM CQ - DOSED IN MG/24 HOURS) 14 mg/24hr patch Place 14 mg onto the skin daily.     simvastatin (ZOCOR) 20 MG tablet Take 20 mg by mouth at  bedtime.     Ubrogepant (UBRELVY) 100 MG TABS TAKE 1 TABLET MOUTH AS NEEDED AS CLOSE TO MIGRAINE ONSET AS POSSIBLE. MAY REPEAT IN 2 HOURS IF NEEDED. MAX 200 MG PER  24 HOURS. 10 tablet 3   albuterol (PROAIR HFA) 108 (90 Base) MCG/ACT inhaler Inhale 2 puffs into the lungs every 6 (six) hours as needed for wheezing or shortness of breath. (Patient not taking: Reported on 01/10/2021) 8 g 3   No current facility-administered medications for this visit.    Allergies as of 01/23/2021 - Review Complete 01/23/2021  Allergen Reaction Noted   Tetracycline Anaphylaxis and Other (See Comments) 11/30/2015   Prednisone Other (See Comments) 11/15/2017   Sulfamethoxazole-trimethoprim Nausea And Vomiting and Other (See Comments) 08/24/2011   Triptans  10/19/2020   Effexor [venlafaxine hcl] Rash 08/24/2011   Venlafaxine Rash 11/30/2015    Vitals: BP 129/71   Pulse 78   Ht 5\' 7"  (1.702 m)   Wt 196 lb (88.9 kg)   BMI 30.70 kg/m  Last Weight:  Wt Readings from Last 1 Encounters:  01/23/21 196 lb (88.9 kg)   Last Height:   Ht Readings from Last 1 Encounters:  01/23/21 5\' 7"  (1.702 m)     Exam: NAD, pleasant                  Speech:    Speech is normal; fluent and spontaneous with normal comprehension.  Cognition:    The patient is oriented to person, place, and time;     recent and remote memory intact;     language fluent;    Cranial Nerves:    The pupils are equal, round, and reactive to light.Trigeminal sensation is intact and the muscles of mastication are normal. The face is symmetric. The palate elevates in the midline. Hearing intact. Voice is normal. Shoulder shrug is normal. The tongue has normal motion without fasciculations.   Coordination:  No dysmetria  Motor Observation:    No asymmetry, no atrophy, and no involuntary movements noted. Tone:    Normal muscle tone.     Strength:    Strength is V/V in the upper and lower limbs.      Sensation: intact to  LT    Assessment/Plan:  65 year old very complicated patient with multiple disorders here for migraines. He has a long history, been to multiple neurologists, has tried most medications. I had a long discussion with him, I am not sure that I have anymore answers for him than previousneurologists. We can transfer his botox therapy here and also try [place] for his intractable migraines and refractory depression. Also try nerivio. Discussed other options, medical devices (he tried cefaly, he knows about VNS stimulator but financially not feasible)   STOP EMGALITY AND QULIPTA and start VYEPTI  -We are performing botox on him every 12 weeks. -We sent him to [facility] therapy.  We also started nerivio.  - consider spravato  -  nerivio and ubrelvy acutely help -. The nerivio helps some. He has been using it when having a new headache and the TMS helped some.  - Dry needling hurts but helps,  - The chronic pain bothers him less than his memory loss which he attributes to chronic pain.  - He has been on the [place] a few months. -The emgality was great then stopped being as great, then went up to 300, now back to 200 and no difference may just have stopped working altogether. Roselyn Meier and Nerivio have worked well.    I spent 30 minutes of face-to-face and non-face-to-face time with patient on the  1. Chronic migraine without aura, with intractable migraine, so stated, with status migrainosus    diagnosis.  This included previsit chart review, lab review, study review, order entry, electronic health record documentation, patient education on the different diagnostic and therapeutic options, counseling and coordination of care, risks and benefits of management, compliance, or risk factor reduction   Cc: Elwyn Reach, MD,   Sarina Ill, MD  [facility] [address] [place], [place] [address]  Phone [phone] Fax [phone]

## 2021-01-23 NOTE — Patient Instructions (Signed)
   Start Vyepti. Stop Emgality and qulipta when we start Vyepti.   Eptinezumab injection What is this medication? EPTINEZUMAB (EP ti NEZ ue mab) is used to prevent migraine headaches. This medicine may be used for other purposes; ask your health care provider or pharmacist if you have questions. COMMON BRAND NAME(S): Vyepti What should I tell my care team before I take this medication? They need to know if you have any of these conditions: an unusual or allergic reaction to eptinezumab, other medicines, foods, dyes, or preservatives pregnant or trying to get pregnant breast-feeding How should I use this medication? This medicine is for infusion into a vein. It is given by a health care professional in a hospital or clinic setting. Talk to your pediatrician about the use of this medicine in children. Special care may be needed. Overdosage: If you think you have taken too much of this medicine contact a poison control center or emergency room at once. NOTE: This medicine is only for you. Do not share this medicine with others. What if I miss a dose? Keep appointments for follow-up doses. It is important not to miss your dose. Call your doctor or health care professional if you are unable to keep an appointment. What may interact with this medication? Interactions are not expected. This list may not describe all possible interactions. Give your health care provider a list of all the medicines, herbs, non-prescription drugs, or dietary supplements you use. Also tell them if you smoke, drink alcohol, or use illegal drugs. Some items may interact with your medicine. What should I watch for while using this medication? Your condition will be monitored carefully while you are receiving this medicine. What side effects may I notice from receiving this medication? Side effects that you should report to your doctor or health care professional as soon as possible: allergic reactions like skin rash,  itching or hives; swelling of the face, lips, or tongue Side effects that usually do not require medical attention (report these to your doctor or health care professional if they continue or are bothersome): sore throat This list may not describe all possible side effects. Call your doctor for medical advice about side effects. You may report side effects to FDA at 1-800-FDA-1088. Where should I keep my medication? This medicine is given in a hospital or clinic and will not be stored at home. NOTE: This sheet is a summary. It may not cover all possible information. If you have questions about this medicine, talk to your doctor, pharmacist, or health care provider.  2022 Elsevier/Gold Standard (2018-06-17 20:21:59)

## 2021-01-23 NOTE — Telephone Encounter (Signed)
Initiated PA on Vyepri 300mg  IV every 3 months on CMM. KEY # [id number].   Once approval will send to [facility] for IV infusion.

## 2021-01-24 NOTE — Telephone Encounter (Addendum)
Received approval on Vyepti through 04/22/2022. Order faxed over to [facility] outpatient infusion center along with insurance/demographic info. Received a receipt of confirmation.

## 2021-01-27 ENCOUNTER — Other Ambulatory Visit: Payer: Self-pay

## 2021-01-27 ENCOUNTER — Encounter: Payer: Self-pay | Admitting: Interventional Cardiology

## 2021-01-27 ENCOUNTER — Ambulatory Visit: Payer: Medicare Other | Admitting: Interventional Cardiology

## 2021-01-27 VITALS — BP 122/72 | HR 73 | Ht 67.0 in | Wt 191.8 lb

## 2021-01-27 DIAGNOSIS — I513 Intracardiac thrombosis, not elsewhere classified: Secondary | ICD-10-CM

## 2021-01-27 DIAGNOSIS — I252 Old myocardial infarction: Secondary | ICD-10-CM

## 2021-01-27 DIAGNOSIS — I25118 Atherosclerotic heart disease of native coronary artery with other forms of angina pectoris: Secondary | ICD-10-CM

## 2021-01-27 DIAGNOSIS — E785 Hyperlipidemia, unspecified: Secondary | ICD-10-CM

## 2021-01-27 MED ORDER — ROSUVASTATIN CALCIUM 20 MG PO TABS: 20.0000 mg | ORAL_TABLET | Freq: Every day | ORAL | 3 refills | Status: DC

## 2021-01-27 NOTE — Patient Instructions (Signed)
Medication Instructions:  Your physician recommends that you continue on your current medications as directed. Please refer to the Current Medication list given to you today.  *If you need a refill on your cardiac medications before your next appointment, please call your pharmacy*   Lab Work: Lab work to be done today--BMP and CBC If you have labs (blood work) drawn today and your tests are completely normal, you will receive your results only by: [place] (if you have MyChart) OR A paper copy in the mail If you have any lab test that is abnormal or we need to change your treatment, we will call you to review the results.   Testing/Procedures: Your physician has requested that you have a cardiac MRI. Cardiac MRI uses a computer to create images of your heart as its beating, producing both still and moving pictures of your heart and major blood vessels. For further information please visit [email or web address] Please follow the instruction sheet given to you today for more information.   Your physician has requested that you have a cardiac catheterization. Cardiac catheterization is used to diagnose and/or treat various heart conditions. Doctors may recommend this procedure for a number of different reasons. The most common reason is to evaluate chest pain. Chest pain can be a symptom of coronary artery disease (CAD), and cardiac catheterization can show whether plaque is narrowing or blocking your heart's arteries. This procedure is also used to evaluate the valves, as well as measure the blood flow and oxygen levels in different parts of your heart. For further information please visit [email or web address].[email or web address]. Please follow instruction sheet, as given. To be done after MRI.  We will call you to schedule this  Follow-Up: At [facility], you and your health needs are our priority.  As part of our continuing mission to provide you with exceptional heart care, we have created designated  Provider Care Teams.  These Care Teams include your primary Cardiologist (physician) and Advanced Practice Providers (APPs -  Physician Assistants and Nurse Practitioners) who all work together to provide you with the care you need, when you need it.  We recommend signing up for the patient portal called "MyChart".  Sign up information is provided on this After Visit Summary.  MyChart is used to connect with patients for Virtual Visits (Telemedicine).  Patients are able to view lab/test results, encounter notes, upcoming appointments, etc.  Non-urgent messages can be sent to your provider as well.   To learn more about what you can do with MyChart, go to [email or web address].    Your next appointment:   Based on test results  The format for your next appointment:   In Person  Provider:   You may see Larae Grooms, MD or one of the following Advanced Practice Providers on your designated Care Team:   Melina Copa, PA-C Ermalinda Barrios, PA-C   Other Instructions    You are scheduled for Cardiac MRI on ______________. Please arrive at the [facility] main entrance of [facility] at ________________ (30-45 minutes prior to test start time). ?You will be called with appointment date and time  [facility] [place], [place] [address] [phone]  Please take advantage of the free valet parking available at the MAIN entrance (A entrance). Proceed to the [facility] Radiology Department (First Floor). ? Magnetic resonance imaging (MRI) is a painless test that produces images of the inside of the body without using Xrays.  During an MRI, strong magnets and  radio waves work together in a [employer] officer, political party to form detailed images.   MRI images may provide more details about a medical condition than X-rays, CT scans, and ultrasounds can provide.  You may be given earphones to listen for instructions.  You may eat a light breakfast and take medications as  ordered with the exception of HCTZ (fluid pill, other). Please avoid stimulants for 12 hr prior to test. (Ie. Caffeine, nicotine, chocolate, or antihistamine medications)  If a contrast material will be used, an IV will be inserted into one of your veins. Contrast material will be injected into your IV. It will leave your body through your urine within a day. You may be told to drink plenty of fluids to help flush the contrast material out of your system.  You will be asked to remove all metal, including: Watch, jewelry, and other metal objects including hearing aids, hair pieces and dentures. Also wearable glucose monitoring systems (ie. Freestyle Libre and Omnipods) (Braces and fillings normally are not a problem.)   TEST WILL TAKE APPROXIMATELY 1 HOUR  PLEASE NOTIFY SCHEDULING AT LEAST 24 HOURS IN ADVANCE IF YOU ARE UNABLE TO KEEP YOUR APPOINTMENT. [phone]  Please call Marchia Bond, cardiac imaging nurse navigator with any questions/concerns. Marchia Bond RN Navigator Cardiac Imaging Gordy Clement RN Navigator Cardiac Imaging [facility] [phone] Office

## 2021-01-27 NOTE — Progress Notes (Signed)
Cardiology Office Note   Date:  01/27/2021   ID:  Kyle Walters, DOB 1955-10-10, MRN [id number]  PCP:  Elwyn Reach, MD    No chief complaint on file.  CAD  Wt Readings from Last 3 Encounters:  01/27/21 191 lb 12.8 oz (87 kg)  01/23/21 196 lb (88.9 kg)  10/17/20 197 lb 3.2 oz (89.4 kg)       History of Present Illness: Kyle Walters is a 65 y.o. male with left ventricular dysfunction.  Patient had a lung cancer screening CT.  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  He was set up for echocardiogram which was done today in our office.  This revealed an LV apical aneurysm and there appeared to be thrombus in the aneurysm with Definity contrast injection.   He had a stress test many years ago no prior catheterization or known MI that he has had.  He does have anxiety and panic issues and has had chest pain on multiple occasions.  He actually went to the emergency room due to anxiety causing chest pain, from reading the CT scan report.  His troponin in the ER was normal.  He was sent home.     He denies any exertional angina.  His exercise level has dropped off in the last few years.  He has migraines stimulated by any type of smell.  This has limited the amount that he goes outside.   Of note, his ECG done in the ER appeared to show poor R wave progression.  When I look back at prior ECG from 2017, he had normal R wave progression.  No other cardiac testing in that time.   Since he had to stay on Eliquis, CTA of the coronaries was done instead of cardiac cath.  This showed an occluded LAD.  There was a possible stenosis in the RCA which was medically treated for some time.  His circumflex and left main were widely patent.  He returns for follow-up today.  Did well with Eliquis.  Denies : Dizziness. Leg edema. Nitroglycerin use. Orthopnea. Palpitations. Paroxysmal nocturnal dyspnea. Shortness of breath. Syncope.    Has chest pain with emotional stress/anxiety.  Past Medical  History:  Diagnosis Date   ADHD (attention deficit hyperactivity disorder)    Anemia    Anxiety panic attack   Arthritis    COPD (chronic obstructive pulmonary disease) (HCC)    bronchitis/emphysema   Depression    Emphysema lung (HCC)    Enlarged prostate    Fibromyalgia    GERD (gastroesophageal reflux disease)    Hyperlipidemia    Hypertension    IBS (irritable bowel syndrome)    Migraine    Panic attack     Past Surgical History:  Procedure Laterality Date   NO PAST SURGERIES       Current Outpatient Medications  Medication Sig Dispense Refill   ALPRAZolam (XANAX) 1 MG tablet Take 1 mg by mouth 4 (four) times daily.      apixaban (ELIQUIS) 5 MG TABS tablet Take 1 tablet (5 mg total) by mouth 2 (two) times daily. 60 tablet 6   Atogepant (QULIPTA) 10 MG TABS Take by mouth.     Botulinum Toxin Type A (BOTOX) 200 units SOLR Provider to inject 155 units into the muscles of the head and neck every 3 months. Discard remainder. 1 each 3   BREO ELLIPTA 100-25 MCG/INH AEPB INHALE 1 PUFF BY MOUTH EVERY DAY 60 each 10   Cholecalciferol (VITAMIN D3)  2000 UNITS TABS Take 2 tablets by mouth as needed.     doxazosin (CARDURA) 8 MG tablet Take 8 mg by mouth daily.     escitalopram (LEXAPRO) 20 MG tablet Take 20 mg by mouth at bedtime. Take 1/2 table by mouth daily  2   ferrous sulfate 325 (65 FE) MG tablet Take by mouth.     gabapentin (NEURONTIN) 400 MG capsule Take 400 mg by mouth 4 (four) times daily.      lansoprazole (PREVACID) 15 MG capsule Take 15 mg by mouth daily.     memantine (NAMENDA) 10 MG tablet Take 20 mg in the morning and 10 mg in the late afternoon 90 tablet 0   Multiple Vitamins-Minerals (CENTRUM SILVER ADULT 50+ PO) Take by mouth.     nicotine (NICODERM CQ - DOSED IN MG/24 HOURS) 14 mg/24hr patch Place 14 mg onto the skin daily.     simvastatin (ZOCOR) 20 MG tablet Take 20 mg by mouth at bedtime.     Ubrogepant (UBRELVY) 100 MG TABS TAKE 1 TABLET MOUTH AS NEEDED AS  CLOSE TO MIGRAINE ONSET AS POSSIBLE. MAY REPEAT IN 2 HOURS IF NEEDED. MAX 200 MG PER 24 HOURS. 10 tablet 3   No current facility-administered medications for this visit.    Allergies:   Tetracycline, Prednisone, Sulfamethoxazole-trimethoprim, Triptans, Effexor [venlafaxine hcl], and Venlafaxine    Social History:  The patient  reports that he quit smoking about 7 years ago. His smoking use included cigarettes. He has a 60.00 pack-year smoking history. He has never used smokeless tobacco. He reports that he does not drink alcohol and does not use drugs.   Family History:  The patient's family history includes Bladder Cancer in his father; Breast cancer in his mother; Diabetes in his father and mother; Hypertension in his brother and father; Irritable bowel syndrome in his father; Migraines in his father; Osteoarthritis in his mother; Other in his brother; Pancreatic cancer in his father; Stroke in his father and mother.    ROS:  Please see the history of present illness.   Otherwise, review of systems are positive for .   All other systems are reviewed and negative.    PHYSICAL EXAM: VS:  BP 122/72   Pulse 73   Ht 5\' 7"  (1.702 m)   Wt 191 lb 12.8 oz (87 kg)   SpO2 94%   BMI 30.04 kg/m  , BMI Body mass index is 30.04 kg/m. GEN: Well nourished, well developed, in no acute distress HEENT: normal Neck: no JVD, carotid bruits, or masses Cardiac: RRR; no murmurs, rubs, or gallops,no edema  Respiratory:  clear to auscultation bilaterally, normal work of breathing GI: soft, nontender, nondistended, + BS MS: no deformity or atrophy Skin: warm and dry, no rash Neuro:  Strength and sensation are intact Psych: euthymic mood, full affect   EKG:   The ekg ordered today demonstrates NSR, septal Q waves   Recent Labs: 10/12/2020: Hemoglobin 14.6; Platelets 245 10/17/2020: BUN 16; Creatinine, Ser 1.12; Potassium 4.8; Sodium 141   Lipid Panel No results found for: CHOL, TRIG, HDL, CHOLHDL,  VLDL, LDLCALC, LDLDIRECT   Other studies Reviewed: Additional studies/ records that were reviewed today with results demonstrating: prior echo reviewed; CT reviewed.   ASSESSMENT AND PLAN:  CAD: Known occlusion of the LAD.  RCA disease noted by CTA as well.  Need to determine what myocardial tissue is viable. Plan for cardiac MRI.  After that, will plan for cath and can base revascularization  on viability results. LV thrombus: He has completed 3 months of anticoagulation. Hyperlipidemia: Has been on simvastatin.  Will increase to a more high potency statin.  Start rosuvastatin 20 mg p.o. daily.  The patient understands that risks include but are not limited to stroke (1 in 1000), death (1 in 75), kidney failure [usually temporary] (1 in 500), bleeding (1 in 200), allergic reaction [possibly serious] (1 in 200), and agrees to proceed.    Current medicines are reviewed at length with the patient today.  The patient concerns regarding his medicines were addressed.  The following changes have been made:  No change  Labs/ tests ordered today include:  No orders of the defined types were placed in this encounter.   Recommend 150 minutes/week of aerobic exercise Low fat, low carb, high fiber diet recommended  Disposition:   FU for cath    Signed, Larae Grooms, MD  01/27/2021 3:12 PM    [facility] [place], [place], [place]  [address] Phone: [phone]; Fax: [phone]

## 2021-01-27 NOTE — Addendum Note (Signed)
Addended by: Thompson Grayer on: 01/27/2021 04:51 PM   Modules accepted: Orders

## 2021-01-28 LAB — CBC
Hematocrit: 41.1 % (ref 37.5–51.0)
Hemoglobin: 14.2 g/dL (ref 13.0–17.7)
MCH: 30.9 pg (ref 26.6–33.0)
MCHC: 34.5 g/dL (ref 31.5–35.7)
MCV: 89 fL (ref 79–97)
Platelets: 272 10*3/uL (ref 150–450)
RBC: 4.6 x10E6/uL (ref 4.14–5.80)
RDW: 12.5 % (ref 11.6–15.4)
WBC: 8.3 10*3/uL (ref 3.4–10.8)

## 2021-01-28 LAB — BASIC METABOLIC PANEL
BUN/Creatinine Ratio: 12 (ref 10–24)
BUN: 14 mg/dL (ref 8–27)
CO2: 28 mmol/L (ref 20–29)
Calcium: 9.8 mg/dL (ref 8.6–10.2)
Chloride: 100 mmol/L (ref 96–106)
Creatinine, Ser: 1.15 mg/dL (ref 0.76–1.27)
Glucose: 90 mg/dL (ref 70–99)
Potassium: 4.5 mmol/L (ref 3.5–5.2)
Sodium: 142 mmol/L (ref 134–144)
eGFR: 70 mL/min/{1.73_m2} (ref >59)

## 2021-01-30 ENCOUNTER — Encounter: Payer: Medicare Other | Admitting: Gastroenterology

## 2021-01-30 ENCOUNTER — Telehealth: Payer: Self-pay | Admitting: *Deleted

## 2021-01-30 NOTE — Telephone Encounter (Signed)
Patient notified.  He is no longer taking Boswellia

## 2021-01-30 NOTE — Telephone Encounter (Signed)
Left message to call office

## 2021-01-30 NOTE — Telephone Encounter (Signed)
-----   Message from Jettie Booze, MD sent at 01/27/2021  4:59 PM EDT ----- OK to use Vyepti as noted below.  ----- Message ----- From: Leeroy Bock, RPH-CPP Sent: 01/27/2021   4:46 PM EDT To: Jettie Booze, MD  No cardiac side effects of concern with Vyepti, no drug interactions with his current meds either. Boswellia can sometimes increase bleeding risk, would recommend stopping supplement since he's also on Eliquis unless he noticed drastic improvements in his migraines on it, in which case would just caution him to be on the lookout for bruising/bleeding out of the ordinary and stop the supplement if he notices any.  Thanks, Jinny Blossom ----- Message ----- From: Jettie Booze, MD Sent: 01/27/2021   4:00 PM EDT To: Leeroy Bock, RPH-CPP  Vyepti infusion was recommended for this patient.  Any issues with cardiac meds.  Likely will get a stent in the future.  Will need CHF meds as well.  He was taking Boswellia for migraines,  as well before starting Eliquis

## 2021-02-01 NOTE — Telephone Encounter (Signed)
I called the infusion center on [address] asking for call back. Need to find out if they infuse Vyepti (not all infusion centers do). If so, does Rx need to be sent to a particular [facility] pharmacy or do they obtain medication simply by [place] sending the order to them? What is their fax number?

## 2021-02-02 ENCOUNTER — Other Ambulatory Visit: Payer: Self-pay | Admitting: *Deleted

## 2021-02-02 ENCOUNTER — Other Ambulatory Visit: Payer: Self-pay | Admitting: Neurology

## 2021-02-02 NOTE — Telephone Encounter (Signed)
Called [facility] outpatient infusion center again. They will call me back.

## 2021-02-02 NOTE — Progress Notes (Unsigned)
Order for Vyepti 300 mg IV every 3 months per Dr Jaynee Eagles.

## 2021-02-02 NOTE — Progress Notes (Signed)
error 

## 2021-02-02 NOTE — Telephone Encounter (Signed)
Received a call back. [facility] infusion center will send [place] instructions on how to place electronic order/referral for the Vyepti.

## 2021-02-03 ENCOUNTER — Ambulatory Visit: Payer: Medicare Other | Admitting: Interventional Cardiology

## 2021-02-06 ENCOUNTER — Other Ambulatory Visit (HOSPITAL_COMMUNITY): Payer: Self-pay

## 2021-02-06 ENCOUNTER — Other Ambulatory Visit: Payer: Self-pay | Admitting: Neurology

## 2021-02-06 DIAGNOSIS — G43711 Chronic migraine without aura, intractable, with status migrainosus: Secondary | ICD-10-CM

## 2021-02-06 MED ORDER — VYEPTI 100 MG/ML IV SOLN
300.0000 mg | INTRAVENOUS | 3 refills | Status: DC
  Filled 2021-02-06 – 2021-03-29 (×2): qty 3, 90d supply, fill #0

## 2021-02-06 NOTE — Telephone Encounter (Signed)
I called the outpatient infusion center on [address] asking for call back. Orders have been placed by Dr Jaynee Eagles under signed/held admission orders. Need to see if these can be accepted/seen by the infusion center.

## 2021-02-07 ENCOUNTER — Other Ambulatory Visit (HOSPITAL_COMMUNITY): Payer: Self-pay

## 2021-02-23 NOTE — Telephone Encounter (Signed)
Received message from [facility]. Qulipta 60 mg assistance has been approved through 04/22/21. They will ship refill out.

## 2021-02-24 ENCOUNTER — Ambulatory Visit (HOSPITAL_COMMUNITY)
Admission: RE | Admit: 2021-02-24 | Discharge: 2021-02-24 | Disposition: A | Discharge status: Home / Self Care | Payer: Medicare Other | Source: Ambulatory Visit | Attending: Neurology | Admitting: Neurology

## 2021-02-24 ENCOUNTER — Other Ambulatory Visit: Payer: Self-pay

## 2021-02-24 DIAGNOSIS — G43711 Chronic migraine without aura, intractable, with status migrainosus: Secondary | ICD-10-CM | POA: Diagnosis not present

## 2021-02-24 MED ORDER — EPTINEZUMAB-JJMR 100 MG/ML IV SOLN
300.0000 mg | INTRAVENOUS | Status: DC
  Filled 2021-02-24: qty 3

## 2021-02-24 MED ORDER — SODIUM CHLORIDE 0.9 % IV SOLN
300.0000 mg | Freq: Once | INTRAVENOUS | Status: AC
  Administered 2021-02-24: 300 mg via INTRAVENOUS
  Filled 2021-02-24: qty 3

## 2021-02-27 ENCOUNTER — Other Ambulatory Visit: Payer: Self-pay

## 2021-02-27 MED ORDER — MEMANTINE HCL 10 MG PO TABS: ORAL_TABLET | ORAL | 0 refills | Status: DC

## 2021-03-01 ENCOUNTER — Telehealth (HOSPITAL_COMMUNITY): Payer: Self-pay | Admitting: *Deleted

## 2021-03-01 NOTE — Telephone Encounter (Signed)
Refill was sent on 11.7.22 by Dr Brett Fairy,

## 2021-03-01 NOTE — Telephone Encounter (Signed)
Reaching out to patient to offer assistance regarding upcoming cardiac imaging study; pt verbalizes understanding of appt date/time, parking situation and where to check in, and verified current allergies; name and call back number provided for further questions should they arise  Piccola Arico RN Navigator Cardiac Imaging [facility] [phone] office [phone] cell  

## 2021-03-02 ENCOUNTER — Ambulatory Visit (HOSPITAL_COMMUNITY)
Admission: RE | Admit: 2021-03-02 | Discharge: 2021-03-02 | Disposition: A | Discharge status: Home / Self Care | Payer: Medicare Other | Source: Ambulatory Visit | Attending: Interventional Cardiology | Admitting: Interventional Cardiology

## 2021-03-02 ENCOUNTER — Other Ambulatory Visit: Payer: Self-pay

## 2021-03-02 DIAGNOSIS — I252 Old myocardial infarction: Secondary | ICD-10-CM | POA: Diagnosis not present

## 2021-03-02 DIAGNOSIS — I513 Intracardiac thrombosis, not elsewhere classified: Secondary | ICD-10-CM | POA: Insufficient documentation

## 2021-03-02 DIAGNOSIS — I25118 Atherosclerotic heart disease of native coronary artery with other forms of angina pectoris: Secondary | ICD-10-CM | POA: Insufficient documentation

## 2021-03-02 MED ORDER — GADOBUTROL 1 MMOL/ML IV SOLN
12.0000 mL | Freq: Once | INTRAVENOUS | Status: AC | PRN
  Administered 2021-03-02: 12 mL via INTRAVENOUS

## 2021-03-06 ENCOUNTER — Encounter: Payer: Self-pay | Admitting: *Deleted

## 2021-03-06 ENCOUNTER — Telehealth: Payer: Self-pay | Admitting: *Deleted

## 2021-03-06 DIAGNOSIS — I252 Old myocardial infarction: Secondary | ICD-10-CM

## 2021-03-06 DIAGNOSIS — I25118 Atherosclerotic heart disease of native coronary artery with other forms of angina pectoris: Secondary | ICD-10-CM

## 2021-03-06 DIAGNOSIS — Z01812 Encounter for preprocedural laboratory examination: Secondary | ICD-10-CM

## 2021-03-06 MED ORDER — ASPIRIN EC 81 MG PO TBEC: 81.0000 mg | DELAYED_RELEASE_TABLET | Freq: Every day | ORAL | 3 refills | Status: DC

## 2021-03-06 NOTE — Telephone Encounter (Signed)
Cath is November 22,2022

## 2021-03-06 NOTE — Telephone Encounter (Signed)
Addressed in other mychart encounter.

## 2021-03-06 NOTE — Telephone Encounter (Signed)
-----   Message from Jettie Booze, MD sent at 03/03/2021 11:14 AM EST ----- Anterior wall not viable, so no need to revascularize LAD.  HE still needs cath given RCA disease noted by CT scan as we discussed at the last office visit.

## 2021-03-06 NOTE — Telephone Encounter (Signed)
Reviewed with Dr Irish Lack and [place] for patient to stop Eliquis.  Needs to start ASA 81 mg by mouth daily.   Will need lab work and EKG prior to cath.  I placed call to patient and left message to call office.

## 2021-03-06 NOTE — Telephone Encounter (Signed)
Patient notified.  Cath scheduled for November 22,2023. I verbally went over all instructions with patient and copy sent to him through my chart.  He will come in for EKG and lab work on November 15,2022.

## 2021-03-07 ENCOUNTER — Ambulatory Visit (INDEPENDENT_AMBULATORY_CARE_PROVIDER_SITE_OTHER): Payer: Medicare Other | Admitting: *Deleted

## 2021-03-07 ENCOUNTER — Other Ambulatory Visit: Payer: Self-pay

## 2021-03-07 ENCOUNTER — Other Ambulatory Visit: Payer: Medicare Other | Admitting: *Deleted

## 2021-03-07 VITALS — HR 58 | Wt 190.0 lb

## 2021-03-07 DIAGNOSIS — Z01818 Encounter for other preprocedural examination: Secondary | ICD-10-CM

## 2021-03-07 DIAGNOSIS — Z01812 Encounter for preprocedural laboratory examination: Secondary | ICD-10-CM | POA: Diagnosis not present

## 2021-03-07 DIAGNOSIS — I25118 Atherosclerotic heart disease of native coronary artery with other forms of angina pectoris: Secondary | ICD-10-CM

## 2021-03-07 DIAGNOSIS — I252 Old myocardial infarction: Secondary | ICD-10-CM

## 2021-03-07 DIAGNOSIS — I513 Intracardiac thrombosis, not elsewhere classified: Secondary | ICD-10-CM

## 2021-03-07 LAB — CBC
Hematocrit: 42.5 % (ref 37.5–51.0)
Hemoglobin: 14.4 g/dL (ref 13.0–17.7)
MCH: 31 pg (ref 26.6–33.0)
MCHC: 33.9 g/dL (ref 31.5–35.7)
MCV: 92 fL (ref 79–97)
Platelets: 245 10*3/uL (ref 150–450)
RBC: 4.64 x10E6/uL (ref 4.14–5.80)
RDW: 13.1 % (ref 11.6–15.4)
WBC: 8.6 10*3/uL (ref 3.4–10.8)

## 2021-03-07 LAB — BASIC METABOLIC PANEL
BUN/Creatinine Ratio: 12 (ref 10–24)
BUN: 14 mg/dL (ref 8–27)
CO2: 30 mmol/L — ABNORMAL HIGH (ref 20–29)
Calcium: 9.9 mg/dL (ref 8.6–10.2)
Chloride: 100 mmol/L (ref 96–106)
Creatinine, Ser: 1.14 mg/dL (ref 0.76–1.27)
Glucose: 107 mg/dL — ABNORMAL HIGH (ref 70–99)
Potassium: 4.1 mmol/L (ref 3.5–5.2)
Sodium: 139 mmol/L (ref 134–144)
eGFR: 71 mL/min/{1.73_m2} (ref >59)

## 2021-03-07 NOTE — Progress Notes (Signed)
Reason for visit: EKG  Name of MD requesting visit: Irish Lack  H&P: pre cath EKG  ROS related to problem:  Patient has no complaints  Assessment and plan per MD:  EKG reviewed by Dr Lovena Le

## 2021-03-10 ENCOUNTER — Encounter: Payer: Self-pay | Admitting: Interventional Cardiology

## 2021-03-13 ENCOUNTER — Telehealth: Payer: Self-pay

## 2021-03-13 NOTE — Telephone Encounter (Signed)
Attempted to call and review pre-procedure instructions below with patient. Patient did not answer the phone. Will call patient back later today.  Cardiac catheterization scheduled at [facility] for: 09:00 am [facility] Main Entrance A [facility]) at:07:00 am  No solid food after midnight prior to cath, clear liquids until 5 AM day of procedure.  Usual morning medications can be taken pre-cath with sips of water including aspirin 81 mg.    Confirmed patient has responsible adult to drive home post procedure and be with patient first 24 hours after arriving home.  [facility] does allow one visitor to accompany you and wait in the hospital waiting room while you are there for your procedure. You and your visitor will be asked to wear a mask once you enter the hospital.  Patient reports does not currently have any new symptoms concerning for COVID-19 and no household members with COVID-19 like illness.

## 2021-03-14 ENCOUNTER — Ambulatory Visit (HOSPITAL_COMMUNITY)
Admission: RE | Admit: 2021-03-14 | Discharge: 2021-03-14 | Disposition: A | Discharge status: Home / Self Care | Payer: Medicare Other | Attending: Interventional Cardiology | Admitting: Interventional Cardiology

## 2021-03-14 ENCOUNTER — Other Ambulatory Visit (HOSPITAL_COMMUNITY): Payer: Self-pay

## 2021-03-14 ENCOUNTER — Other Ambulatory Visit: Payer: Self-pay

## 2021-03-14 ENCOUNTER — Encounter (HOSPITAL_COMMUNITY)
Admission: RE | Disposition: A | Discharge status: Home / Self Care | Payer: Medicare Other | Source: Home / Self Care | Attending: Interventional Cardiology

## 2021-03-14 DIAGNOSIS — Z7982 Long term (current) use of aspirin: Secondary | ICD-10-CM | POA: Insufficient documentation

## 2021-03-14 DIAGNOSIS — E785 Hyperlipidemia, unspecified: Secondary | ICD-10-CM | POA: Diagnosis not present

## 2021-03-14 DIAGNOSIS — Z7902 Long term (current) use of antithrombotics/antiplatelets: Secondary | ICD-10-CM | POA: Diagnosis not present

## 2021-03-14 DIAGNOSIS — I252 Old myocardial infarction: Secondary | ICD-10-CM | POA: Diagnosis not present

## 2021-03-14 DIAGNOSIS — Z955 Presence of coronary angioplasty implant and graft: Secondary | ICD-10-CM

## 2021-03-14 DIAGNOSIS — F419 Anxiety disorder, unspecified: Secondary | ICD-10-CM | POA: Insufficient documentation

## 2021-03-14 DIAGNOSIS — I2510 Atherosclerotic heart disease of native coronary artery without angina pectoris: Secondary | ICD-10-CM

## 2021-03-14 DIAGNOSIS — I25118 Atherosclerotic heart disease of native coronary artery with other forms of angina pectoris: Secondary | ICD-10-CM | POA: Insufficient documentation

## 2021-03-14 HISTORY — PX: LEFT HEART CATH AND CORONARY ANGIOGRAPHY: CATH118249

## 2021-03-14 HISTORY — PX: CORONARY STENT INTERVENTION: CATH118234

## 2021-03-14 LAB — POCT ACTIVATED CLOTTING TIME
Activated Clotting Time: 352 seconds
Activated Clotting Time: 439 seconds

## 2021-03-14 SURGERY — LEFT HEART CATH AND CORONARY ANGIOGRAPHY
Anesthesia: LOCAL

## 2021-03-14 MED ORDER — CLOPIDOGREL BISULFATE 300 MG PO TABS
ORAL_TABLET | ORAL | Status: AC
  Filled 2021-03-14: qty 2

## 2021-03-14 MED ORDER — FAMOTIDINE IN NACL 20-0.9 MG/50ML-% IV SOLN
INTRAVENOUS | Status: DC | PRN
  Administered 2021-03-14: 20 mg via INTRAVENOUS

## 2021-03-14 MED ORDER — SODIUM CHLORIDE 0.9% FLUSH: 3.0000 mL | Freq: Two times a day (BID) | INTRAVENOUS | Status: DC

## 2021-03-14 MED ORDER — ALPRAZOLAM 0.25 MG PO TABS
1.0000 mg | ORAL_TABLET | Freq: Once | ORAL | Status: AC
  Administered 2021-03-14: 1 mg via ORAL

## 2021-03-14 MED ORDER — CLOPIDOGREL BISULFATE 75 MG PO TABS: 75.0000 mg | ORAL_TABLET | Freq: Every day | ORAL | Status: DC

## 2021-03-14 MED ORDER — LIDOCAINE HCL (PF) 1 % IJ SOLN
INTRAMUSCULAR | Status: DC | PRN
  Administered 2021-03-14: 2 mL

## 2021-03-14 MED ORDER — HEPARIN (PORCINE) IN NACL 1000-0.9 UT/500ML-% IV SOLN
INTRAVENOUS | Status: DC | PRN
  Administered 2021-03-14 (×2): 500 mL

## 2021-03-14 MED ORDER — SODIUM CHLORIDE 0.9% FLUSH: 3.0000 mL | INTRAVENOUS | Status: DC | PRN

## 2021-03-14 MED ORDER — HYDRALAZINE HCL 20 MG/ML IJ SOLN: 10.0000 mg | INTRAMUSCULAR | Status: DC | PRN

## 2021-03-14 MED ORDER — VERAPAMIL HCL 2.5 MG/ML IV SOLN
INTRAVENOUS | Status: DC | PRN
  Administered 2021-03-14: 10 mL via INTRA_ARTERIAL

## 2021-03-14 MED ORDER — NITROGLYCERIN 0.4 MG SL SUBL
0.4000 mg | SUBLINGUAL_TABLET | SUBLINGUAL | 2 refills | Status: DC | PRN
  Filled 2021-03-14: qty 25, 7d supply, fill #0

## 2021-03-14 MED ORDER — IOHEXOL 350 MG/ML SOLN
INTRAVENOUS | Status: DC | PRN
  Administered 2021-03-14: 165 mL

## 2021-03-14 MED ORDER — VERAPAMIL HCL 2.5 MG/ML IV SOLN
INTRAVENOUS | Status: DC | PRN
  Administered 2021-03-14 (×2): 200 ug via INTRACORONARY

## 2021-03-14 MED ORDER — PANTOPRAZOLE SODIUM 40 MG PO TBEC: 40.0000 mg | DELAYED_RELEASE_TABLET | Freq: Every day | ORAL | 2 refills | Status: DC

## 2021-03-14 MED ORDER — MIDAZOLAM HCL 2 MG/2ML IJ SOLN
INTRAMUSCULAR | Status: AC
  Filled 2021-03-14: qty 2

## 2021-03-14 MED ORDER — VERAPAMIL HCL 2.5 MG/ML IV SOLN
INTRAVENOUS | Status: AC
  Filled 2021-03-14: qty 2

## 2021-03-14 MED ORDER — NITROGLYCERIN 1 MG/10 ML FOR IR/CATH LAB
INTRA_ARTERIAL | Status: AC
  Filled 2021-03-14: qty 10

## 2021-03-14 MED ORDER — SODIUM CHLORIDE 0.9 % IV SOLN: INTRAVENOUS | Status: AC

## 2021-03-14 MED ORDER — HEPARIN (PORCINE) IN NACL 1000-0.9 UT/500ML-% IV SOLN
INTRAVENOUS | Status: AC
  Filled 2021-03-14: qty 500

## 2021-03-14 MED ORDER — MIDAZOLAM HCL 2 MG/2ML IJ SOLN
INTRAMUSCULAR | Status: DC | PRN
  Administered 2021-03-14: 1 mg via INTRAVENOUS
  Administered 2021-03-14: 2 mg via INTRAVENOUS
  Administered 2021-03-14 (×5): 1 mg via INTRAVENOUS

## 2021-03-14 MED ORDER — FENTANYL CITRATE (PF) 100 MCG/2ML IJ SOLN
INTRAMUSCULAR | Status: AC
  Filled 2021-03-14: qty 2

## 2021-03-14 MED ORDER — ASPIRIN 81 MG PO CHEW
81.0000 mg | CHEWABLE_TABLET | ORAL | Status: AC
  Administered 2021-03-14: 81 mg via ORAL
  Filled 2021-03-14: qty 1

## 2021-03-14 MED ORDER — ACETAMINOPHEN 325 MG PO TABS: 650.0000 mg | ORAL_TABLET | ORAL | Status: DC | PRN

## 2021-03-14 MED ORDER — FAMOTIDINE IN NACL 20-0.9 MG/50ML-% IV SOLN
INTRAVENOUS | Status: AC
  Filled 2021-03-14: qty 50

## 2021-03-14 MED ORDER — SODIUM CHLORIDE 0.9 % WEIGHT BASED INFUSION: 1.0000 mL/kg/h | INTRAVENOUS | Status: DC

## 2021-03-14 MED ORDER — CLOPIDOGREL BISULFATE 300 MG PO TABS
ORAL_TABLET | ORAL | Status: DC | PRN
  Administered 2021-03-14: 600 mg via ORAL

## 2021-03-14 MED ORDER — HEPARIN SODIUM (PORCINE) 1000 UNIT/ML IJ SOLN
INTRAMUSCULAR | Status: DC | PRN
  Administered 2021-03-14: 4500 [IU] via INTRAVENOUS
  Administered 2021-03-14: 6000 [IU] via INTRAVENOUS

## 2021-03-14 MED ORDER — ONDANSETRON HCL 4 MG/2ML IJ SOLN: 4.0000 mg | Freq: Four times a day (QID) | INTRAMUSCULAR | Status: DC | PRN

## 2021-03-14 MED ORDER — LIDOCAINE HCL (PF) 1 % IJ SOLN
INTRAMUSCULAR | Status: AC
  Filled 2021-03-14: qty 30

## 2021-03-14 MED ORDER — CLOPIDOGREL BISULFATE 75 MG PO TABS
75.0000 mg | ORAL_TABLET | Freq: Every day | ORAL | 1 refills | Status: DC
  Filled 2021-03-14: qty 90, 90d supply, fill #0

## 2021-03-14 MED ORDER — ASPIRIN 81 MG PO CHEW: 81.0000 mg | CHEWABLE_TABLET | Freq: Every day | ORAL | Status: DC

## 2021-03-14 MED ORDER — SODIUM CHLORIDE 0.9 % IV SOLN: 250.0000 mL | INTRAVENOUS | Status: DC | PRN

## 2021-03-14 MED ORDER — NITROGLYCERIN 1 MG/10 ML FOR IR/CATH LAB
INTRA_ARTERIAL | Status: DC | PRN
  Administered 2021-03-14: 200 ug via INTRACORONARY

## 2021-03-14 MED ORDER — ALPRAZOLAM 0.25 MG PO TABS
ORAL_TABLET | ORAL | Status: AC
  Filled 2021-03-14: qty 4

## 2021-03-14 MED ORDER — FENTANYL CITRATE (PF) 100 MCG/2ML IJ SOLN
INTRAMUSCULAR | Status: DC | PRN
  Administered 2021-03-14 (×8): 25 ug via INTRAVENOUS

## 2021-03-14 MED ORDER — HEPARIN SODIUM (PORCINE) 1000 UNIT/ML IJ SOLN
INTRAMUSCULAR | Status: AC
  Filled 2021-03-14: qty 1

## 2021-03-14 MED ORDER — SODIUM CHLORIDE 0.9 % WEIGHT BASED INFUSION
3.0000 mL/kg/h | INTRAVENOUS | Status: AC
  Administered 2021-03-14: 3 mL/kg/h via INTRAVENOUS

## 2021-03-14 SURGICAL SUPPLY — 23 items
BALLN SAPPHIRE 2.5X15 (BALLOONS) ×2
BALLN SAPPHIRE ~~LOC~~ 3.25X18 (BALLOONS) ×1 IMPLANT
BALLN SAPPHIRE ~~LOC~~ 3.75X18 (BALLOONS) ×1 IMPLANT
BALLOON SAPPHIRE 2.5X15 (BALLOONS) IMPLANT
CATH INFINITI 5 FR JL3.5 (CATHETERS) ×1 IMPLANT
CATH INFINITI 5FR MULTPACK ANG (CATHETERS) ×1 IMPLANT
CATH LAUNCHER 5F EBU3.0 (CATHETERS) IMPLANT
CATH LAUNCHER 6FR AL.75 (CATHETERS) ×1 IMPLANT
CATHETER LAUNCHER 5F EBU3.0 (CATHETERS) ×2
DEVICE RAD COMP TR BAND LRG (VASCULAR PRODUCTS) ×1 IMPLANT
GLIDESHEATH SLEND SS 6F .021 (SHEATH) ×1 IMPLANT
GUIDEWIRE INQWIRE 1.5J.035X260 (WIRE) IMPLANT
INQWIRE 1.5J .035X260CM (WIRE) ×2
KIT ENCORE 26 ADVANTAGE (KITS) ×1 IMPLANT
KIT HEART LEFT (KITS) ×2 IMPLANT
KIT HEMO VALVE WATCHDOG (MISCELLANEOUS) ×1 IMPLANT
PACK CARDIAC CATHETERIZATION (CUSTOM PROCEDURE TRAY) ×2 IMPLANT
SHEATH 6FR 85 DEST SLENDER (SHEATH) ×1 IMPLANT
STENT ONYX FRONTIER 2.75X22 (Permanent Stent) ×1 IMPLANT
STENT ONYX FRONTIER 3.0X34 (Permanent Stent) ×1 IMPLANT
TRANSDUCER W/STOPCOCK (MISCELLANEOUS) ×2 IMPLANT
TUBING CIL FLEX 10 FLL-RA (TUBING) ×2 IMPLANT
WIRE ASAHI PROWATER 180CM (WIRE) ×1 IMPLANT

## 2021-03-14 NOTE — H&P (Signed)
Cardiology Admission History and Physical:   Patient ID: Greig Altergott MRN: [id number]; DOB: 01-25-1956   Admission date: 03/14/2021  PCP:  Elwyn Reach, MD   [facility] Cardiologist:  Larae Grooms, MD        Chief Complaint:  CAD  Patient Profile:   Kyle Walters is a 65 y.o. male with prior MI who is being seen 03/14/2021 for the evaluation of CAD.  History of Present Illness:   Mr. Kluth was found to have an apical thrombus on echo incidentally.  He was treated with Eliquis for 3 months.  CTA showed occluded LAD and significant disease in the RCA.  Cardiac MRI showed nonviability of the anterior wall and apex.  Due to significant residual disease, the plan was for cardiac catheterization.   Past Medical History:  Diagnosis Date   ADHD (attention deficit hyperactivity disorder)    Anemia    Anxiety panic attack   Arthritis    COPD (chronic obstructive pulmonary disease) (HCC)    bronchitis/emphysema   Depression    Emphysema lung (HCC)    Enlarged prostate    Fibromyalgia    GERD (gastroesophageal reflux disease)    Hyperlipidemia    Hypertension    IBS (irritable bowel syndrome)    Migraine    Panic attack     Past Surgical History:  Procedure Laterality Date   NO PAST SURGERIES       Medications Prior to Admission: Prior to Admission medications   Medication Sig Start Date End Date Taking? Authorizing Provider  ALPRAZolam Duanne Moron) 1 MG tablet Take 1 mg by mouth 4 (four) times daily.    Yes [provider]  aspirin EC 81 MG tablet Take 1 tablet (81 mg total) by mouth daily. Swallow whole. 03/06/21  Yes Jettie Booze, MD  Atogepant (QULIPTA) 60 MG TABS Take 60 mg by mouth every evening.   Yes [provider]  Boswellia Serrata (BOSWELLIA PO) Take 1,125 mg by mouth in the morning, at noon, and at bedtime. 375 mg/capsule   Yes [provider]  Botulinum Toxin Type A (BOTOX) 200 units SOLR Provider to  inject 155 units into the muscles of the head and neck every 3 months. Discard remainder. 08/02/20  Yes Melvenia Beam, MD  BREO ELLIPTA 100-25 MCG/INH AEPB INHALE 1 PUFF BY MOUTH EVERY DAY 11/07/20  Yes Collene Gobble, MD  Cholecalciferol (VITAMIN D3) 2000 UNITS TABS Take 4,000 Units by mouth daily after breakfast.   Yes [provider]  doxazosin (CARDURA) 8 MG tablet Take 8 mg by mouth daily with supper.   Yes [provider]  Eptinezumab-jjmr (VYEPTI) 100 MG/ML injection Inject 3 mLs (300 mg total) into the vein every 3 (three) months. 02/06/21  Yes Melvenia Beam, MD  escitalopram (LEXAPRO) 20 MG tablet Take 10 mg by mouth in the morning. 10/26/14  Yes [provider]  Ferrous Sulfate (IRON PO) Take 85 mg by mouth daily after breakfast. Hema Plex Iron   Yes [provider]  gabapentin (NEURONTIN) 400 MG capsule Take 400 mg by mouth 4 (four) times daily.    Yes [provider]  lansoprazole (PREVACID) 15 MG capsule Take 15 mg by mouth in the morning.   Yes [provider]  MAGNESIUM MALATE PO Take 400 mg by mouth daily after breakfast.   Yes [provider]  memantine (NAMENDA) 10 MG tablet Take 20 mg in the morning and 10 mg in the late afternoon  Patient taking differently: Take 10-20 mg by mouth See admin instructions. Take 20 mg in the morning and 10 mg by mouth with supper 02/27/21  Yes Dohmeier, Asencion Partridge, MD  Multiple Vitamin (MULTIVITAMIN WITH MINERALS) TABS tablet Take 1 tablet by mouth daily after breakfast. Centrum Silver for Men   Yes [provider]  nicotine (NICODERM CQ - DOSED IN MG/24 HOURS) 14 mg/24hr patch Place 14 mg onto the skin daily.   Yes [provider]  Omega-3 Fatty Acids (FISH OIL) 1200 MG CAPS Take 1,200 mg by mouth daily after breakfast.   Yes [provider]  rosuvastatin (CRESTOR) 20 MG tablet Take 1 tablet (20 mg total) by mouth daily. 01/27/21  Yes Jettie Booze, MD   silodosin (RAPAFLO) 8 MG CAPS capsule Take 8 mg by mouth daily with breakfast.   Yes [provider]  Ubrogepant (UBRELVY) 100 MG TABS TAKE 1 TABLET MOUTH AS NEEDED AS CLOSE TO MIGRAINE ONSET AS POSSIBLE. MAY REPEAT IN 2 HOURS IF NEEDED. MAX 200 MG PER 24 HOURS. 10/19/20  Yes Melvenia Beam, MD     Allergies:    Allergies  Allergen Reactions   Tetracycline Anaphylaxis and Other (See Comments)   Prednisone Other (See Comments)    Unable to sleep Unable to sleep   Sulfamethoxazole-Trimethoprim Nausea And Vomiting and Other (See Comments)   Triptans     Contraindicated d/t h/o MI    Effexor [Venlafaxine Hcl] Rash    Social History:   Social History   Socioeconomic History   Marital status: Married    Spouse name: [name]   Number of children: 1   Years of education: Not on file   Highest education level: Not on file  Occupational History   Occupation: disabled  Tobacco Use   Smoking status: Former    Packs/day: 1.50    Years: 40.00    Pack years: 60.00    Types: Cigarettes    Quit date: 05/17/2013    Years since quitting: 7.8   Smokeless tobacco: Never   Tobacco comments:    Also using E-Cig  Vaping Use   Vaping Use: Former  Substance and Sexual Activity   Alcohol use: No    Alcohol/week: 0.0 standard drinks   Drug use: No   Sexual activity: Not on file  Other Topics Concern   Not on file  Social History Narrative   Lives at home with wife and stepson    Right handed   Caffeine: 8 oz coke per day    Social Determinants of Health   Financial Resource Strain: Not on file  Food Insecurity: Not on file  Transportation Needs: Not on file  Physical Activity: Not on file  Stress: Not on file  Social Connections: Not on file  Intimate Partner Violence: Not on file    Family History:   The patient's family history includes Bladder Cancer in his father; Breast cancer in his mother; Diabetes in his father and mother; Hypertension in his brother and father;  Irritable bowel syndrome in his father; Migraines in his father; Osteoarthritis in his mother; Other in his brother; Pancreatic cancer in his father; Stroke in his father and mother. There is no history of Colon cancer, Rectal cancer, or Esophageal cancer.    ROS:  Please see the history of present illness.  All other ROS reviewed and negative.     Physical Exam/Data:   Vitals:   03/14/21 0722  BP: 100/67  Pulse: 77  Temp: 98.3  F (36.8 C)  TempSrc: Oral  SpO2: 96%  Weight: 86.6 kg  Height: 5\' 6"  (1.676 m)   No intake or output data in the 24 hours ending 03/14/21 0737 Last 3 Weights 03/14/2021 03/07/2021 02/24/2021  Weight (lbs) 191 lb 190 lb 190 lb  Weight (kg) 86.637 kg 86.183 kg 86.183 kg     Body mass index is 30.83 kg/m.  General:  Well nourished, well developed, in no acute distress HEENT: normal Neck: no JVD Vascular: No carotid bruits; Distal pulses 2+ bilaterally   Cardiac:  normal S1, S2; RRR; no murmur  Lungs:  clear to auscultation bilaterally, no wheezing, rhonchi or rales  Abd: soft, nontender, no hepatomegaly  Ext: no edema Musculoskeletal:  No deformities, BUE and BLE strength normal and equal Skin: warm and dry  Neuro:  CNs 2-12 intact, no focal abnormalities noted Psych:  Normal affect      Relevant CV Studies: As noted above  Laboratory Data:  High Sensitivity Troponin:  No results for input(s): TROPONINIHS in the last 720 hours.    Chemistry Recent Labs  Lab 03/07/21 1442  NA 139  K 4.1  CL 100  CO2 30*  GLUCOSE 107*  BUN 14  CREATININE 1.14  CALCIUM 9.9    No results for input(s): PROT, ALBUMIN, AST, ALT, ALKPHOS, BILITOT in the last 168 hours. Lipids No results for input(s): CHOL, TRIG, HDL, LABVLDL, LDLCALC, CHOLHDL in the last 168 hours. Hematology Recent Labs  Lab 03/07/21 1442  WBC 8.6  RBC 4.64  HGB 14.4  HCT 42.5  MCV 92  MCH 31.0  MCHC 33.9  RDW 13.1  PLT 245   Thyroid No results for input(s): TSH, FREET4 in the  last 168 hours. BNPNo results for input(s): BNP, PROBNP in the last 168 hours.  DDimer No results for input(s): DDIMER in the last 168 hours.   Radiology/Studies:  No results found.   Assessment and Plan:   CAD/old MI: Plan for cardiac catheterization with possible PCI of the RCA as well.  Would not attempt PCI of the LAD due to nonviability at the apex.  All questions about cardiac cath were answered.  The patient has had anxiety issues.  We will have to make a concerted effort to keep him well sedated.  His concerns are documented in the chart.  The patient understands that risks include but are not limited to stroke (1 in 1000), death (1 in 41), kidney failure [usually temporary] (1 in 500), bleeding (1 in 200), allergic reaction [possibly serious] (1 in 200), and agrees to proceed.     Risk Assessment/Risk Scores:      For questions or updates, please contact [place] Please consult [email or web address] for contact info under     Signed, Larae Grooms, MD  03/14/2021 7:37 AM

## 2021-03-14 NOTE — Discharge Summary (Addendum)
Discharge Summary for Same Day PCI   Patient ID: Kyle Walters MRN: [id number]; DOB: 01/27/1956  Admit date: 03/14/2021 Discharge date: 03/14/2021  Primary Care Provider: Elwyn Reach, MD  Primary Cardiologist: Larae Grooms, MD  Primary Electrophysiologist:  None   Discharge Diagnoses    Principal Problem:   Coronary artery disease Active Problems:   Hyperlipidemia   Diagnostic Studies/Procedures    Cardiac Catheterization 03/14/2021:   Mid LAD lesion is 100% stenosed.  Left to left collaterals.  The apex was nonviable by cardiac MRI.   RPAV lesion is 100% stenosed.  Left to right collaterals.   Prox RCA lesion is 90% stenosed.   A drug-eluting stent was successfully placed using a STENT ONYX FRONTIER 3.0X34, postdilated to greater than 3.75 mm.   Post intervention, there is a 0% residual stenosis.   Mid RCA lesion is 75% stenosed.   A drug-eluting stent was successfully placed using a STENT ONYX FRONTIER 2.75X22.   Post intervention, there is a 0% residual stenosis.   LV end diastolic pressure is normal.   There is no aortic valve stenosis.   Continue dual antiplatelet therapy with aspirin and Plavix.  He has completed 3 months of anticoagulation with Eliquis for his LV thrombus.  Continue aggressive secondary prevention including high-dose statin.  Prevacid will have to be changed to Protonix.   Diagnostic Dominance: Right Intervention   _____________   History of Present Illness     Kyle Walters is a 65 y.o. male with past medical history of hypertension, hyperlipidemia, migraines, COPD, ADHD who was recently seen in the office.  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  He was set up for echocardiogram in the office which showed an LV apical aneurysm with what appeared to be thrombus in the aneurysm with Definity contrast.  Notes indicate he had a stress test many years ago but no prior cardiac catheterization  or known MI.  He actually went to the ER secondary to anxiety with chest pain after reading the CT scan report.  Troponin in the ER was normal.  Seen by Dr. Irish Lack on 10/7 with EKG done in the office that showed poor R wave progression.  Coronary CT was done and set of cath showing occluded LAD with possible stenosis in the RCA which was recommended to be treated medically at the time.  At this office visit plan was made to proceed with cardiac MRI and after that potentially plan for cath and revascularization based on viability results.  He was started on Crestor 20 mg daily.  Cardiac MRI showed anterior wall not viable but needed cath given his RCA disease that was noted on CT scan.  Cardiac catheterization was arranged for further evaluation.  Hospital Course     The patient underwent cardiac cath as noted above with underwent cardiac catheterization noted above with PCI/DES x1 to proximal RCA along with DES x1 to mid RCA. Plan for DAPT with ASA/Plavix for at least 6 months.  It is noted he had completed his anticoagulation with Eliquis for his LV thrombus.  Prevacid will be changed to Protonix at discharge the patient was seen by cardiac rehab while in short stay. There were no observed complications post cath. Radial cath site was re-evaluated prior to discharge and found to be stable without any complications. Instructions/precautions regarding cath site care were given prior to discharge.  Suliman Termini was seen by Dr. Irish Lack and determined stable for discharge home.  Follow up with our office has been arranged. Medications are listed below. Pertinent changes include addition of Plavix, Protonix and sublingual nitro.  _____________  Cath/PCI Registry Performance & Quality Measures: Aspirin prescribed? - Yes ADP Receptor Inhibitor (Plavix/Clopidogrel, Brilinta/Ticagrelor or Effient/Prasugrel) prescribed (includes medically managed patients)? - Yes High Intensity Statin (Lipitor 40-80mg  or  Crestor 20-40mg ) prescribed? - Yes For EF <40%, was ACEI/ARB prescribed? - No - Reason:  will need to consider at outpatient follow up. Will hold on starting at DC given just received contrast dye For EF <40%, Aldosterone Antagonist (Spironolactone or Eplerenone) prescribed? - No - Reason:  will need to consider at outpatient follow up. Will hold on starting at DC given just received contrast dye Cardiac Rehab Phase II ordered (Included Medically managed Patients)? - Yes  _____________   Discharge Vitals Blood pressure 102/69, pulse (!) 54, temperature 98.3 F (36.8 C), temperature source Oral, resp. rate 14, height 5\' 6"  (1.676 m), weight 86.6 kg, SpO2 95 %.  Filed Weights   03/14/21 0722  Weight: 86.6 kg    Last Labs & Radiologic Studies    CBC No results for input(s): WBC, NEUTROABS, HGB, HCT, MCV, PLT in the last 72 hours. Basic Metabolic Panel No results for input(s): NA, K, CL, CO2, GLUCOSE, BUN, CREATININE, CALCIUM, MG, PHOS in the last 72 hours. Liver Function Tests No results for input(s): AST, ALT, ALKPHOS, BILITOT, PROT, ALBUMIN in the last 72 hours. No results for input(s): LIPASE, AMYLASE in the last 72 hours. High Sensitivity Troponin:   No results for input(s): TROPONINIHS in the last 720 hours.  BNP Invalid input(s): POCBNP D-Dimer No results for input(s): DDIMER in the last 72 hours. Hemoglobin A1C No results for input(s): HGBA1C in the last 72 hours. Fasting Lipid Panel No results for input(s): CHOL, HDL, LDLCALC, TRIG, CHOLHDL, LDLDIRECT in the last 72 hours. Thyroid Function Tests No results for input(s): TSH, T4TOTAL, T3FREE, THYROIDAB in the last 72 hours.  Invalid input(s): FREET3 _____________  CARDIAC CATHETERIZATION  Result Date: 03/14/2021   Mid LAD lesion is 100% stenosed.  Left to left collaterals.  The apex was nonviable by cardiac MRI.   RPAV lesion is 100% stenosed.  Left to right collaterals.   Prox RCA lesion is 90% stenosed.   A  drug-eluting stent was successfully placed using a STENT ONYX FRONTIER 3.0X34, postdilated to greater than 3.75 mm.   Post intervention, there is a 0% residual stenosis.   Mid RCA lesion is 75% stenosed.   A drug-eluting stent was successfully placed using a STENT ONYX FRONTIER 2.75X22.   Post intervention, there is a 0% residual stenosis.   LV end diastolic pressure is normal.   There is no aortic valve stenosis. Continue dual antiplatelet therapy with aspirin and Plavix.  He has completed 3 months of anticoagulation with Eliquis for his LV thrombus.  Continue aggressive secondary prevention including high-dose statin.  Prevacid will have to be changed to Protonix.   MR CARDIAC MORPHOLOGY W WO CONTRAST  Result Date: 03/03/2021 CLINICAL DATA:  CAD, assess viability EXAM: CARDIAC MRI TECHNIQUE: The patient was scanned on a 1.5 Tesla GE magnet. A dedicated cardiac coil was used. Functional imaging was done using Fiesta sequences. 2,3, and 4 chamber views were done to assess for RWMA's. Modified Simpson's rule using a short axis stack was used to calculate an ejection fraction on a dedicated work Conservation officer, nature. The patient received 8 cc of Gadavist. After 10 minutes inversion recovery sequences were used  to assess for infiltration and scar tissue. FINDINGS: Limited images of the lung fields showed no gross abnormalities. Trivial pericardial effusion. Normal left ventricular size and wall thickness. There is a thin-walled apical aneurysm involving the true apex and the peri-apical segments present with layered LV thrombus, the remainder of the LV wall segments function normally. LV EF 51%. Normal right ventriclar size and systolic function, EF 50%. Normal right and left atrial sizes. Trileaflet aortic valve with no stenosis or regurgitation. No significant mitral regurgitation. DELAYED ENHANCEMENT IMAGING: DELAYED ENHANCEMENT IMAGING Mid anterior wall: < 50% wall thickness subendocardial late  gadolinium enhancement (LGE). Mid septal wall: < 50% wall thickness subendocardial LGE. True apex + apical septal, apical anterior, apical lateral and apical inferior walls: Near full thickness LGE MEASUREMENTS: MEASUREMENTS LVEDV 156 mL LVSV 80 mL LVEF 51% RVEDV 135 mL RVSV 71 mL RVEF 53% IMPRESSION: 1. Normal LV size with EF 51%. Apical aneurysm with layered LV thrombus present. 2.  Normal RV size and systolic function, EF 35%. 3. Near transmural LGE involving the true apex and the peri-apical segments. This area is unlikely to improve with revasculariziation (not viable). The remainder of the LV has minimal LGE and appears viable. Dalton Mclean Electronically Signed   By: Loralie Champagne M.D.   On: 03/03/2021 10:50    Disposition   Pt is being discharged home today in good condition.  Follow-up Plans & Appointments     Follow-up Information     Imogene Burn, PA-C Follow up on 03/21/2021.   Specialty: Cardiology Why: at 7:45am for your follow up appt Contact information: [address] [place] [address] [phone]                Discharge Instructions     AMB Referral to Cardiac Rehabilitation - Phase II   Complete by: As directed    Diagnosis: Coronary Stents   After initial evaluation and assessments completed: Virtual Based Care may be provided alone or in conjunction with Phase 2 Cardiac Rehab based on patient barriers.: Yes        Discharge Medications   Allergies as of 03/14/2021       Reactions   Tetracycline Anaphylaxis, Other (See Comments)   Prednisone Other (See Comments)   Unable to sleep Unable to sleep   Sulfamethoxazole-trimethoprim Nausea And Vomiting, Other (See Comments)   Triptans    Contraindicated d/t h/o MI    Effexor [venlafaxine Hcl] Rash        Medication List     STOP taking these medications    lansoprazole 15 MG capsule Commonly known as: PREVACID       TAKE these medications    ALPRAZolam 1 MG  tablet Commonly known as: XANAX Take 1 mg by mouth 4 (four) times daily.   aspirin EC 81 MG tablet Take 1 tablet (81 mg total) by mouth daily. Swallow whole.   BOSWELLIA PO Take 1,125 mg by mouth in the morning, at noon, and at bedtime. 375 mg/capsule   Botox 200 units Solr Generic drug: Botulinum Toxin Type A Provider to inject 155 units into the muscles of the head and neck every 3 months. Discard remainder.   Breo Ellipta 100-25 MCG/ACT Aepb Generic drug: fluticasone furoate-vilanterol INHALE 1 PUFF BY MOUTH EVERY DAY   clopidogrel 75 MG tablet Commonly known as: PLAVIX Take 1 tablet (75 mg total) by mouth daily with breakfast. Start taking on: March 15, 2021   doxazosin 8 MG tablet Commonly  known as: CARDURA Take 8 mg by mouth daily with supper.   escitalopram 20 MG tablet Commonly known as: LEXAPRO Take 10 mg by mouth in the morning.   Fish Oil 1200 MG Caps Take 1,200 mg by mouth daily after breakfast.   gabapentin 400 MG capsule Commonly known as: NEURONTIN Take 400 mg by mouth 4 (four) times daily.   IRON PO Take 85 mg by mouth daily after breakfast. Hema Plex Iron   MAGNESIUM MALATE PO Take 400 mg by mouth daily after breakfast.   memantine 10 MG tablet Commonly known as: NAMENDA Take 20 mg in the morning and 10 mg in the late afternoon What changed:  how much to take how to take this when to take this additional instructions   multivitamin with minerals Tabs tablet Take 1 tablet by mouth daily after breakfast. Centrum Silver for Men   nicotine 14 mg/24hr patch Commonly known as: NICODERM CQ - dosed in mg/24 hours Place 14 mg onto the skin daily.   nitroGLYCERIN 0.4 MG SL tablet Commonly known as: Nitrostat Place 1 tablet (0.4 mg total) under the tongue every 5 (five) minutes as needed.   pantoprazole 40 MG tablet Commonly known as: Protonix Take 1 tablet (40 mg total) by mouth daily.   Qulipta 60 MG Tabs Generic drug: Atogepant Take 60  mg by mouth every evening.   rosuvastatin 20 MG tablet Commonly known as: CRESTOR Take 1 tablet (20 mg total) by mouth daily.   silodosin 8 MG Caps capsule Commonly known as: RAPAFLO Take 8 mg by mouth daily with breakfast.   Ubrelvy 100 MG Tabs Generic drug: Ubrogepant TAKE 1 TABLET MOUTH AS NEEDED AS CLOSE TO MIGRAINE ONSET AS POSSIBLE. MAY REPEAT IN 2 HOURS IF NEEDED. MAX 200 MG PER 24 HOURS.   Vitamin D3 50 MCG (2000 UT) Tabs Take 4,000 Units by mouth daily after breakfast.   Vyepti 100 MG/ML injection Generic drug: Eptinezumab-jjmr Inject 3 mLs (300 mg total) into the vein every 3 (three) months.          Allergies Allergies  Allergen Reactions   Tetracycline Anaphylaxis and Other (See Comments)   Prednisone Other (See Comments)    Unable to sleep Unable to sleep   Sulfamethoxazole-Trimethoprim Nausea And Vomiting and Other (See Comments)   Triptans     Contraindicated d/t h/o MI    Effexor [Venlafaxine Hcl] Rash    Outstanding Labs/Studies   N/a   Duration of Discharge Encounter   Greater than 30 minutes including physician time.  Signed, Reino Bellis, NP 03/14/2021, 1:47 PM  I have examined the patient and reviewed assessment and plan and discussed with patient.  Agree with above as stated.    Right wrist stable.  Continue DAPT.    WiIl need to postpone colonoscopy that was overdue due to antiplatelet therapy.    Larae Grooms

## 2021-03-14 NOTE — Progress Notes (Signed)
Patient stated that he takes his home medication Alprazolam 1 mg and Gabapentin 400 mg at 1200. Ria Comment, NP gave verbal order for Alprazolam 1 mg, but not Gabapentin 400 mg because the amount of sedation that was given during the procedure. Verbal order placed and followed.

## 2021-03-14 NOTE — Progress Notes (Signed)
CARDIAC REHAB PHASE I   Stent education completed with pt and spouse. Pt educated on importance of ASA, Plavix, statin, and NTG. Pt given heart healthy diet. Reviewed site care, restrictions, and NTG. Pt states he has a difficult time differentiating between "true" CP, GERD, and his panic disorder. Referred to CRP II [place] Rufina Falco, RN BSN 03/14/2021 1:57 PM

## 2021-03-14 NOTE — Interval H&P Note (Signed)
Cath Lab Visit (complete for each Cath Lab visit)  Clinical Evaluation Leading to the Procedure:   ACS: No.  Non-ACS:    Anginal Classification: CCS III  Anti-ischemic medical therapy: Minimal Therapy (1 class of medications)  Non-Invasive Test Results: Intermediate-risk stress test findings: cardiac mortality 1-3%/year  Prior CABG: No previous CABG      History and Physical Interval Note:  03/14/2021 8:53 AM  Kyle Walters  has presented today for surgery, with the diagnosis of cad.  The various methods of treatment have been discussed with the patient and family. After consideration of risks, benefits and other options for treatment, the patient has consented to  Procedure(s): LEFT HEART CATH AND CORONARY ANGIOGRAPHY (N/A) as a surgical intervention.  The patient's history has been reviewed, patient examined, no change in status, stable for surgery.  I have reviewed the patient's chart and labs.  Questions were answered to the patient's satisfaction.     Larae Grooms

## 2021-03-15 ENCOUNTER — Encounter (HOSPITAL_COMMUNITY): Payer: Self-pay | Admitting: Interventional Cardiology

## 2021-03-20 ENCOUNTER — Telehealth: Payer: Self-pay | Admitting: Interventional Cardiology

## 2021-03-20 MED ORDER — PANTOPRAZOLE SODIUM 20 MG PO TBEC: 20.0000 mg | DELAYED_RELEASE_TABLET | Freq: Every day | ORAL | 3 refills | Status: DC

## 2021-03-20 NOTE — Telephone Encounter (Signed)
Pt returning phone call... please advise  

## 2021-03-20 NOTE — Telephone Encounter (Signed)
I wonder if the headaches are more related to the contrast die and the amount of sedation he received. Its ok to decrease pantoprazole or to stop it and just try famotidine 20mg  once to twice a day.

## 2021-03-20 NOTE — Telephone Encounter (Signed)
Pt c/o medication issue:  1. Name of Medication: clopidogrel (PLAVIX) 75 MG tablet pantoprazole (PROTONIX) 40 MG tablet  2. How are you currently taking this medication (dosage and times per day)? AS DIRECTED  3. Are you having a reaction (difficulty breathing--STAT)? NO  4. What is your medication issue? PT SAID HE HAVE BEEN HAVING CHRONIC MIGRAINES SINCE HE HAVE BEEN TAKING THESE 2 MEDS. PT HAS A HISTORY OF CHRONIC MIGRAINES PRIOR TO TAKING THIS MEDICINE.

## 2021-03-20 NOTE — Telephone Encounter (Signed)
Left message to call office

## 2021-03-20 NOTE — Telephone Encounter (Signed)
I spoke with patient and gave him information from pharmacist.  He is unable to take famotidine.  He would like to try Pantoprazole 20 mg daily. [facility] [address]. Patient had to cancel hospital follow up with Ermalinda Barrios, PA on 11/30 due to transportation issues.  I scheduled patient to see Dr Irish Lack on 03/28/21 at 2:20.  He will let us know if headaches do not improve.

## 2021-03-20 NOTE — Telephone Encounter (Signed)
I spoke with patient. He has had a daily migraine since last Friday.  Has chronic migraines but prior to this last migraine was 11/19. Migraines last 4 days have been severe. He is asking if this could be related to Pantoprazole and clopidogrel which were recently started.  No other recent changes. He is asking if medicines could be changed Was on lansoprazole 15 daily but was changed to pantoprazole 40 when Clopidogrel started. Patient is asking if Pantoprazole should be decreased to 20 mg.  Will forward to PharmD for review

## 2021-03-21 DIAGNOSIS — L299 Pruritus, unspecified: Secondary | ICD-10-CM | POA: Diagnosis not present

## 2021-03-21 DIAGNOSIS — H938X9 Other specified disorders of ear, unspecified ear: Secondary | ICD-10-CM | POA: Diagnosis not present

## 2021-03-21 DIAGNOSIS — Z87891 Personal history of nicotine dependence: Secondary | ICD-10-CM | POA: Diagnosis not present

## 2021-03-21 DIAGNOSIS — H6123 Impacted cerumen, bilateral: Secondary | ICD-10-CM | POA: Diagnosis not present

## 2021-03-22 ENCOUNTER — Ambulatory Visit: Payer: Medicare Other | Admitting: Physician Assistant

## 2021-03-26 NOTE — Progress Notes (Signed)
Cardiology Office Note   Date:  03/28/2021   ID:  Kyle Walters, DOB 1956-01-06, MRN [id number]  PCP:  Elwyn Reach, MD    No chief complaint on file.  CAD  Wt Readings from Last 3 Encounters:  03/28/21 190 lb 12.8 oz (86.5 kg)  03/14/21 191 lb (86.6 kg)  03/07/21 190 lb (86.2 kg)       History of Present Illness: Kyle Walters is a 65 y.o. male  with left ventricular dysfunction.   Patient had a lung cancer screening CT.  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  He was set up for echocardiogram which was done today in our office.  This revealed an LV apical aneurysm and there appeared to be thrombus in the aneurysm with Definity contrast injection.   He had a stress test many years ago no prior catheterization or known MI that he has had.  He does have anxiety and panic issues and has had chest pain on multiple occasions.  He actually went to the emergency room due to anxiety causing chest pain, from reading the CT scan report.  His troponin in the ER was normal.  He was sent home.     He denies any exertional angina.  His exercise level has dropped off in the last few years.  He has migraines stimulated by any type of smell.  This has limited the amount that he goes outside.   Of note, his ECG done in 2022 appeared to show poor R wave progression.  When I look back at prior ECG from 2017, he had normal R wave progression.  No other cardiac testing in that time.   Echo showed LV thrombus and he was started on ELiquis. CTA of the coronaries was done.  This showed an occluded LAD.  There was a possible stenosis in the RCA which was medically treated for some time.  His circumflex and left main were widely patent.  Cardiac MRI showed nonviable apex.   He took Eliquis for 3 months and then had cath in 02/2021: "Mid LAD lesion is 100% stenosed.  Left to left collaterals.  The apex was nonviable by cardiac MRI.   RPAV lesion is 100% stenosed.  Left to right collaterals.   Prox  RCA lesion is 90% stenosed.   A drug-eluting stent was successfully placed using a STENT ONYX FRONTIER 3.0X34, postdilated to greater than 3.75 mm.   Post intervention, there is a 0% residual stenosis.   Mid RCA lesion is 75% stenosed.   A drug-eluting stent was successfully placed using a STENT ONYX FRONTIER 2.75X22.   Post intervention, there is a 0% residual stenosis.   LV end diastolic pressure is normal.   There is no aortic valve stenosis.   Continue dual antiplatelet therapy with aspirin and Plavix.  He has completed 3 months of anticoagulation with Eliquis for his LV thrombus.  Continue aggressive secondary prevention including high-dose statin.  Prevacid will have to be changed to Protonix."  There was concern for recurrence of LV thrombus since his apex was found to be nonviable.   He restarted Boswellia after the cath and started having more dehydration and headaches.   Denies : Chest pain. Dizziness. Leg edema. Nitroglycerin use. Orthopnea. Palpitations. Paroxysmal nocturnal dyspnea. Shortness of breath. Syncope.      Past Medical History:  Diagnosis Date   ADHD (attention deficit hyperactivity disorder)    Anemia    Anxiety panic attack   Arthritis    COPD (  chronic obstructive pulmonary disease) (HCC)    bronchitis/emphysema   Depression    Emphysema lung (HCC)    Enlarged prostate    Fibromyalgia    GERD (gastroesophageal reflux disease)    Hyperlipidemia    Hypertension    IBS (irritable bowel syndrome)    Migraine    Panic attack     Past Surgical History:  Procedure Laterality Date   CORONARY STENT INTERVENTION N/A 03/14/2021   Procedure: CORONARY STENT INTERVENTION;  Surgeon: Jettie Booze, MD;  Location: [facility];  Service: Cardiovascular;  Laterality: N/A;   LEFT HEART CATH AND CORONARY ANGIOGRAPHY N/A 03/14/2021   Procedure: LEFT HEART CATH AND CORONARY ANGIOGRAPHY;  Surgeon: Jettie Booze, MD;  Location: [facility];   Service: Cardiovascular;  Laterality: N/A;   NO PAST SURGERIES       Current Outpatient Medications  Medication Sig Dispense Refill   ALPRAZolam (XANAX) 1 MG tablet Take 1 mg by mouth 4 (four) times daily.      Atogepant (QULIPTA) 60 MG TABS Take 60 mg by mouth every evening.     Botulinum Toxin Type A (BOTOX) 200 units SOLR Provider to inject 155 units into the muscles of the head and neck every 3 months. Discard remainder. 1 each 3   BREO ELLIPTA 100-25 MCG/INH AEPB INHALE 1 PUFF BY MOUTH EVERY DAY 60 each 10   Cholecalciferol (VITAMIN D3) 2000 UNITS TABS Take 4,000 Units by mouth daily after breakfast.     clopidogrel (PLAVIX) 75 MG tablet Take 1 tablet (75 mg total) by mouth daily with breakfast. 90 tablet 1   doxazosin (CARDURA) 8 MG tablet Take 8 mg by mouth daily with supper.     Eptinezumab-jjmr (VYEPTI) 100 MG/ML injection Inject 3 mLs (300 mg total) into the vein every 3 (three) months. 3 mL 3   escitalopram (LEXAPRO) 20 MG tablet Take 10 mg by mouth in the morning.  2   Ferrous Sulfate (IRON PO) Take 85 mg by mouth daily after breakfast. Hema Plex Iron     gabapentin (NEURONTIN) 400 MG capsule Take 400 mg by mouth 4 (four) times daily.      MAGNESIUM MALATE PO Take 400 mg by mouth daily after breakfast.     memantine (NAMENDA) 10 MG tablet Take 20 mg in the morning and 10 mg in the late afternoon (Patient taking differently: Take 10-20 mg by mouth See admin instructions. Take 20 mg in the morning and 10 mg by mouth with supper) 90 tablet 0   Multiple Vitamin (MULTIVITAMIN WITH MINERALS) TABS tablet Take 1 tablet by mouth daily after breakfast. Centrum Silver for Men     nicotine (NICODERM CQ - DOSED IN MG/24 HOURS) 14 mg/24hr patch Place 14 mg onto the skin daily.     nitroGLYCERIN (NITROSTAT) 0.4 MG SL tablet Place 1 tablet (0.4 mg total) under the tongue every 5 (five) minutes as needed. 25 tablet 2   Omega-3 Fatty Acids (FISH OIL) 1200 MG CAPS Take 1,200 mg by mouth daily after  breakfast.     pantoprazole (PROTONIX) 20 MG tablet Take 1 tablet (20 mg total) by mouth daily. 90 tablet 3   rosuvastatin (CRESTOR) 20 MG tablet Take 1 tablet (20 mg total) by mouth daily. 90 tablet 3   silodosin (RAPAFLO) 8 MG CAPS capsule Take 8 mg by mouth daily with breakfast.     Ubrogepant (UBRELVY) 100 MG TABS TAKE 1 TABLET MOUTH AS NEEDED AS CLOSE TO  MIGRAINE ONSET AS POSSIBLE. MAY REPEAT IN 2 HOURS IF NEEDED. MAX 200 MG PER 24 HOURS. 10 tablet 3   aspirin EC 81 MG tablet Take 1 tablet (81 mg total) by mouth daily. Swallow whole. (Patient not taking: Reported on 03/28/2021) 90 tablet 3   Boswellia Serrata (BOSWELLIA PO) Take 1,125 mg by mouth in the morning, at noon, and at bedtime. 375 mg/capsule (Patient not taking: Reported on 03/28/2021)     No current facility-administered medications for this visit.    Allergies:   Tetracycline, Prednisone, Sulfamethoxazole-trimethoprim, Triptans, and Effexor [venlafaxine hcl]    Social History:  The patient  reports that he quit smoking about 7 years ago. His smoking use included cigarettes. He has a 60.00 pack-year smoking history. He has never used smokeless tobacco. He reports that he does not drink alcohol and does not use drugs.   Family History:  The patient's family history includes Bladder Cancer in his father; Breast cancer in his mother; Diabetes in his father and mother; Hypertension in his brother and father; Irritable bowel syndrome in his father; Migraines in his father; Osteoarthritis in his mother; Other in his brother; Pancreatic cancer in his father; Stroke in his father and mother.    ROS:  Please see the history of present illness.   Otherwise, review of systems are positive for stress/anxiety.   All other systems are reviewed and negative.    PHYSICAL EXAM: VS:  BP 118/80   Pulse 88   Ht 5\' 6"  (1.676 m)   Wt 190 lb 12.8 oz (86.5 kg)   SpO2 95%   BMI 30.80 kg/m  , BMI Body mass index is 30.8 kg/m. GEN: Well nourished,  well developed, in no acute distress HEENT: normal Neck: no JVD, carotid bruits, or masses Cardiac: RRR; no murmurs, rubs, or gallops,no edema  Respiratory:  clear to auscultation bilaterally, normal work of breathing GI: soft, nontender, nondistended, + BS MS: no deformity or atrophy Skin: warm and dry, no rash Neuro:  Strength and sensation are intact Psych: euthymic mood, full affect     Recent Labs: 03/07/2021: BUN 14; Creatinine, Ser 1.14; Hemoglobin 14.4; Platelets 245; Potassium 4.1; Sodium 139   Lipid Panel No results found for: CHOL, TRIG, HDL, CHOLHDL, VLDL, LDLCALC, LDLDIRECT   Other studies Reviewed: Additional studies/ records that were reviewed today with results demonstrating: cath results reviewed.   ASSESSMENT AND PLAN:  CAD/Old MI: Known occluded LAD with nonviable distal anterior wall and apex.  Status post PCI of the RCA. LV thrombus: Completed 3 months of Eliquis.  Discussed risk of recurrence of LV thrombus since the apex is akinetic.  There are some data that Coumadin may be more effective than Eliquis at treating apical thrombus, but clearly it is less convenient.  We discussed various options and he is agreeable to starting Coumadin.  We will plan to reimage his LV in 2023. Hyperlipidemia: Continue high potency statin.  Whole food, plant-based diet.     Current medicines are reviewed at length with the patient today.  The patient concerns regarding his medicines were addressed.  The following changes have been made:    Labs/ tests ordered today include: INR check in 1 week No orders of the defined types were placed in this encounter.   Recommend 150 minutes/week of aerobic exercise Low fat, low carb, high fiber diet recommended  Disposition:   FU in 4 months wth me, next with COumadin clinic to see efficacy of 5 mg daily.  Signed, Larae Grooms, MD  03/28/2021 2:30 PM    [address], [place], [place]   [address] Phone: [phone]; Fax: [phone]

## 2021-03-27 ENCOUNTER — Other Ambulatory Visit (HOSPITAL_COMMUNITY): Payer: Self-pay

## 2021-03-28 ENCOUNTER — Other Ambulatory Visit (HOSPITAL_COMMUNITY): Payer: Self-pay

## 2021-03-28 ENCOUNTER — Other Ambulatory Visit: Payer: Self-pay

## 2021-03-28 ENCOUNTER — Ambulatory Visit: Payer: Medicare Other | Admitting: Interventional Cardiology

## 2021-03-28 ENCOUNTER — Encounter: Payer: Self-pay | Admitting: Interventional Cardiology

## 2021-03-28 ENCOUNTER — Telehealth (HOSPITAL_COMMUNITY): Payer: Self-pay

## 2021-03-28 VITALS — BP 118/80 | HR 88 | Ht 66.0 in | Wt 190.8 lb

## 2021-03-28 DIAGNOSIS — Z955 Presence of coronary angioplasty implant and graft: Secondary | ICD-10-CM

## 2021-03-28 DIAGNOSIS — I252 Old myocardial infarction: Secondary | ICD-10-CM | POA: Diagnosis not present

## 2021-03-28 DIAGNOSIS — I25118 Atherosclerotic heart disease of native coronary artery with other forms of angina pectoris: Secondary | ICD-10-CM | POA: Diagnosis not present

## 2021-03-28 DIAGNOSIS — I513 Intracardiac thrombosis, not elsewhere classified: Secondary | ICD-10-CM

## 2021-03-28 DIAGNOSIS — E785 Hyperlipidemia, unspecified: Secondary | ICD-10-CM

## 2021-03-28 MED ORDER — WARFARIN SODIUM 5 MG PO TABS: 5.0000 mg | ORAL_TABLET | Freq: Every day | ORAL | 0 refills | Status: DC

## 2021-03-28 MED ORDER — WARFARIN SODIUM 5 MG PO TABS: 5.0000 mg | ORAL_TABLET | Freq: Every day | ORAL | 3 refills | Status: DC

## 2021-03-28 NOTE — Telephone Encounter (Signed)
Will check insurance benefits closer to scheduling and/or into the new year 2023. 

## 2021-03-28 NOTE — Patient Instructions (Addendum)
Medication Instructions:  Your physician has recommended you make the following change in your medication: Stop Aspirin Start Warfarin 5 mg by mouth daily  *If you need a refill on your cardiac medications before your next appointment, please call your pharmacy*   Lab Work: none If you have labs (blood work) drawn today and your tests are completely normal, you will receive your results only by: [place] (if you have MyChart) OR A paper copy in the mail If you have any lab test that is abnormal or we need to change your treatment, we will call you to review the results.   Testing/Procedures: none   Follow-Up: At [facility], you and your health needs are our priority.  As part of our continuing mission to provide you with exceptional heart care, we have created designated Provider Care Teams.  These Care Teams include your primary Cardiologist (physician) and Advanced Practice Providers (APPs -  Physician Assistants and Nurse Practitioners) who all work together to provide you with the care you need, when you need it.  We recommend signing up for the patient portal called "MyChart".  Sign up information is provided on this After Visit Summary.  MyChart is used to connect with patients for Virtual Visits (Telemedicine).  Patients are able to view lab/test results, encounter notes, upcoming appointments, etc.  Non-urgent messages can be sent to your provider as well.   To learn more about what you can do with MyChart, go to [email or web address].    Your next appointment:   July 24, 2021 at 3:00  The format for your next appointment:   In Person  Provider:   Larae Grooms, MD     Other Instructions   New coumadin clinic appointment on December 12 at 2:30

## 2021-03-29 ENCOUNTER — Other Ambulatory Visit (HOSPITAL_COMMUNITY): Payer: Self-pay

## 2021-03-30 ENCOUNTER — Telehealth: Payer: Self-pay | Admitting: Neurology

## 2021-03-30 NOTE — Telephone Encounter (Signed)
Patient has a Botox appointment 12/20. Today I received (1) 200 unit vial of Botox from [facility].

## 2021-04-03 ENCOUNTER — Ambulatory Visit: Payer: Medicare Other | Admitting: *Deleted

## 2021-04-03 ENCOUNTER — Other Ambulatory Visit (HOSPITAL_COMMUNITY): Payer: Self-pay

## 2021-04-03 ENCOUNTER — Other Ambulatory Visit: Payer: Self-pay

## 2021-04-03 DIAGNOSIS — Z5181 Encounter for therapeutic drug level monitoring: Secondary | ICD-10-CM | POA: Insufficient documentation

## 2021-04-03 DIAGNOSIS — I513 Intracardiac thrombosis, not elsewhere classified: Secondary | ICD-10-CM | POA: Insufficient documentation

## 2021-04-03 LAB — POCT INR: INR: 3 (ref 2.0–3.0)

## 2021-04-03 NOTE — Patient Instructions (Addendum)
Description   Start taking 1 tablet daily except 1/2 tablet on Mondays. Recheck INR in 1 week. Anticoagulation Clinic [phone] Main [phone]    A full discussion of the nature of anticoagulants has been carried out.  A benefit risk analysis has been presented to the patient, so that they understand the justification for choosing anticoagulation at this time. The need for frequent and regular monitoring, precise dosage adjustment and compliance is stressed.  Side effects of potential bleeding are discussed.  The patient should avoid any OTC items containing aspirin or ibuprofen, and should avoid great swings in general diet.  Avoid alcohol consumption.  Call if any signs of abnormal bleeding.

## 2021-04-10 ENCOUNTER — Telehealth: Payer: Self-pay | Admitting: *Deleted

## 2021-04-10 ENCOUNTER — Ambulatory Visit: Payer: Medicare Other

## 2021-04-10 ENCOUNTER — Other Ambulatory Visit: Payer: Self-pay

## 2021-04-10 DIAGNOSIS — I513 Intracardiac thrombosis, not elsewhere classified: Secondary | ICD-10-CM

## 2021-04-10 DIAGNOSIS — Z5181 Encounter for therapeutic drug level monitoring: Secondary | ICD-10-CM

## 2021-04-10 LAB — POCT INR: INR: 6 — AB (ref 2.0–3.0)

## 2021-04-10 NOTE — Patient Instructions (Signed)
Description   DO NOT HIGHLIGHT PAPERS & GIVE PRINTED PAPERWORK TO WIFE-THESE THINGS TRIGGER MIGRAINES. Skip 3 dosages of Warfarin, then start taking 1/2 tablet daily except 1 tablet on Tuesdays, Thursdays and Saturdays.Continue eating leafy veggies daily as normal. Recheck INR in 1 week. Anticoagulation Clinic [phone] Main [phone]

## 2021-04-10 NOTE — Telephone Encounter (Signed)
Completed PA for Botox via [employer] portal. Request approves 155 units every 12 weeks for 1 year. Dx: T03.546. PA #[id number] (04/10/21- 04/10/22).

## 2021-04-10 NOTE — Telephone Encounter (Signed)
Rajon Cheek Key: [id number] - PA Case ID: [id number]  PA for Alvie Heidelberg was sent this afternoon waiting on approval

## 2021-04-11 ENCOUNTER — Telehealth (HOSPITAL_COMMUNITY): Payer: Self-pay

## 2021-04-11 ENCOUNTER — Other Ambulatory Visit (HOSPITAL_COMMUNITY): Payer: Self-pay

## 2021-04-11 ENCOUNTER — Encounter (HOSPITAL_COMMUNITY): Payer: Self-pay

## 2021-04-11 ENCOUNTER — Ambulatory Visit (INDEPENDENT_AMBULATORY_CARE_PROVIDER_SITE_OTHER): Payer: Medicare Other | Admitting: Neurology

## 2021-04-11 DIAGNOSIS — G43711 Chronic migraine without aura, intractable, with status migrainosus: Secondary | ICD-10-CM | POA: Diagnosis not present

## 2021-04-11 NOTE — Progress Notes (Signed)
Consent Form Botulism Toxin Injection For Chronic Migraine  04/11/2021: 3rd botox. He does not clench and used follow the pain protocol and put extra around the crown and right occipitalis of his head if needed. Marland KitchenHe is on [place] and vyepti. He has tried most medications I can think of for migraine, concomitant psychiatric hx and PTSD. I have explained I may not be able to help him and he may need to go to an academic center. He has been taking atogepant still even when we started vyepti, we had discussed stopping the atogepant. I am not aware of any contraindications to taking all of these but I told him I am not sure. I recommended we try going off of atogepant but he says he is going through a bad patch of migraines and does not want to. I have contacted the Vyepti rep and asked her MSL to follow up on taking Vyepti with atogepant and ubrelvy. I checked [facility] and there were no interactions in the interaction checker but advised patient I am just not sure if he should be taking qulipta with vyepti but since he has had no side effects we can continue while I ask Vyepti MSL for research.   01/10/2021; second Botox. He has chronic migraines without aura and those are doing better on botox > 50% improvement in frequency. . He has episodic migraines with aura will prescribe qulipta. We see his wife [name]. Got the [place] approved. The nerivio helps some. He has been using it when having a new headache and the TMS helped some. Dry needling hurts but helps, he knew a PT who performs dry needling and that has helped some too.   Reviewed orally with patient, additionally signature is on file:  Botulism toxin has been approved by the Federal drug administration for treatment of chronic migraine. Botulism toxin does not cure chronic migraine and it may not be effective in some patients.  The administration of botulism toxin is accomplished by injecting a small amount of toxin  into the muscles of the neck and head. Dosage must be titrated for each individual. Any benefits resulting from botulism toxin tend to wear off after 3 months with a repeat injection required if benefit is to be maintained. Injections are usually done every 3-4 months with maximum effect peak achieved by about 2 or 3 weeks. Botulism toxin is expensive and you should be sure of what costs you will incur resulting from the injection.  The side effects of botulism toxin use for chronic migraine may include:   -Transient, and usually mild, facial weakness with facial injections  -Transient, and usually mild, head or neck weakness with head/neck injections  -Reduction or loss of forehead facial animation due to forehead muscle weakness  -Eyelid drooping  -Dry eye  -Pain at the site of injection or bruising at the site of injection  -Double vision  -Potential unknown long term risks  Contraindications: You should not have Botox if you are pregnant, nursing, allergic to albumin, have an infection, skin condition, or muscle weakness at the site of the injection, or have myasthenia gravis, Lambert-Eaton syndrome, or ALS.  It is also possible that as with any injection, there may be an allergic reaction or no effect from the medication. Reduced effectiveness after repeated injections is sometimes seen and rarely infection at the injection site may occur. All care will be taken to prevent these side effects. If therapy is given over a long time, atrophy  and wasting in the muscle injected may occur. Occasionally the patient's become refractory to treatment because they develop antibodies to the toxin. In this event, therapy needs to be modified.  I have read the above information and consent to the administration of botulism toxin.    BOTOX PROCEDURE NOTE FOR MIGRAINE HEADACHE    Contraindications and precautions discussed with patient(above). Aseptic procedure was observed and patient tolerated  procedure. Procedure performed by Dr. Georgia Dom  The condition has existed for more than 6 months, and pt does not have a diagnosis of ALS, Myasthenia Gravis or Lambert-Eaton Syndrome.  Risks and benefits of injections discussed and pt agrees to proceed with the procedure.  Written consent obtained  These injections are medically necessary. Pt  receives good benefits from these injections. These injections do not cause sedations or hallucinations which the oral therapies may cause.  Description of procedure:  The patient was placed in a sitting position. The standard protocol was used for Botox as follows, with 5 units of Botox injected at each site:   -Procerus muscle, midline injection  -Corrugator muscle, bilateral injection  -Frontalis muscle, bilateral injection, with 2 sites each side, medial injection was performed in the upper one third of the frontalis muscle, in the region vertical from the medial inferior edge of the superior orbital rim. The lateral injection was again in the upper one third of the forehead vertically above the lateral limbus of the cornea, 1.5 cm lateral to the medial injection site.  -Temporalis muscle injection, 4 sites, bilaterally. The first injection was 3 cm above the tragus of the ear, second injection site was 1.5 cm to 3 cm up from the first injection site in line with the tragus of the ear. The third injection site was 1.5-3 cm forward between the first 2 injection sites. The fourth injection site was 1.5 cm posterior to the second injection site.   -Occipitalis muscle injection, 3 sites, bilaterally. The first injection was done one half way between the occipital protuberance and the tip of the mastoid process behind the ear. The second injection site was done lateral and superior to the first, 1 fingerbreadth from the first injection. The third injection site was 1 fingerbreadth superiorly and medially from the first injection site.  -Cervical  paraspinal muscle injection, 2 sites, bilateral knee first injection site was 1 cm from the midline of the cervical spine, 3 cm inferior to the lower border of the occipital protuberance. The second injection site was 1.5 cm superiorly and laterally to the first injection site.  -Trapezius muscle injection was performed at 3 sites, bilaterally. The first injection site was in the upper trapezius muscle halfway between the inflection point of the neck, and the acromion. The second injection site was one half way between the acromion and the first injection site. The third injection was done between the first injection site and the inflection point of the neck.   Will return for repeat injection in 3 months.   155 units of Botox was used, 45U Botox not injected was wasted. The patient tolerated the procedure well, there were no complications of the above procedure.

## 2021-04-11 NOTE — Progress Notes (Signed)
Botox- 200 units x 1 vial Lot: [id number] Expiration: 06/2023 NDC: 0871-9941-29  0.9% Sodium Chloride- 26mL total Lot: [id number] Expiration: 03/2022 NDC: 17921-783-75  Dx:G43.711 S/P

## 2021-04-11 NOTE — Telephone Encounter (Signed)
Attempted to call patient in regards to Cardiac Rehab - LM on VM Mailed letter 

## 2021-04-11 NOTE — Telephone Encounter (Signed)
Transitions of Care Pharmacy   Call attempted for a pharmacy transitions of care follow-up. HIPAA appropriate voicemail was left with call back information provided.   Call attempt #2. Will follow-up in 2-3 days.    

## 2021-04-11 NOTE — Telephone Encounter (Signed)
Pt was approved for Kyle Walters   MMN#[id number] Approval Dates -04/23/2021-04/22/2022  Will contact patient to make him aware

## 2021-04-12 ENCOUNTER — Telehealth (HOSPITAL_COMMUNITY): Payer: Self-pay

## 2021-04-12 NOTE — Telephone Encounter (Signed)
Transitions of Care Pharmacy   Call attempted for a pharmacy transitions of care follow-up. HIPAA appropriate voicemail was left with call back information provided.   Call attempt #3. Will no longer attempt follow up for TOC pharmacy.   

## 2021-04-18 ENCOUNTER — Ambulatory Visit: Payer: Medicare Other

## 2021-04-18 ENCOUNTER — Other Ambulatory Visit: Payer: Self-pay

## 2021-04-18 DIAGNOSIS — I513 Intracardiac thrombosis, not elsewhere classified: Secondary | ICD-10-CM | POA: Diagnosis not present

## 2021-04-18 DIAGNOSIS — Z5181 Encounter for therapeutic drug level monitoring: Secondary | ICD-10-CM

## 2021-04-18 LAB — POCT INR: INR: 2.3 (ref 2.0–3.0)

## 2021-04-18 NOTE — Telephone Encounter (Signed)
Pt called back and is interested in the cardiac rehab program, pt will come to orientation on 05/18/2021@1 :15pm and will attend the 3:00pm exercise time.   Mailed packet. Pt prefers to be called "[name]"

## 2021-04-18 NOTE — Patient Instructions (Signed)
Description   DO NOT HIGHLIGHT PAPERS & GIVE PRINTED PAPERWORK TO WIFE-THESE THINGS TRIGGER MIGRAINES. Continue on same dosage 1/2 tablet daily except 1 tablet on Tuesdays, Thursdays and Saturdays.Continue eating leafy veggies daily as normal. Recheck INR in 1 week. Anticoagulation Clinic [phone] Main [phone]

## 2021-04-19 ENCOUNTER — Other Ambulatory Visit: Payer: Self-pay | Admitting: Interventional Cardiology

## 2021-04-20 ENCOUNTER — Other Ambulatory Visit: Payer: Self-pay | Admitting: Neurology

## 2021-04-23 ENCOUNTER — Encounter: Payer: Self-pay | Admitting: Interventional Cardiology

## 2021-04-25 ENCOUNTER — Other Ambulatory Visit: Payer: Self-pay

## 2021-04-25 ENCOUNTER — Ambulatory Visit: Payer: Medicare Other

## 2021-04-25 DIAGNOSIS — I513 Intracardiac thrombosis, not elsewhere classified: Secondary | ICD-10-CM | POA: Diagnosis not present

## 2021-04-25 DIAGNOSIS — Z5181 Encounter for therapeutic drug level monitoring: Secondary | ICD-10-CM | POA: Diagnosis not present

## 2021-04-25 LAB — POCT INR: INR: 2.7 (ref 2.0–3.0)

## 2021-04-25 NOTE — Patient Instructions (Signed)
Continue on same dosage 1/2 tablet daily except 1 tablet on Tuesdays, Thursdays and Saturdays.Continue eating leafy veggies daily as normal. Recheck INR in 2 weeks. Anticoagulation Clinic [phone] Main [phone]

## 2021-04-26 NOTE — Telephone Encounter (Signed)
Pt insurance is active and benefits verified through [facility] Medicare Co-pay 0, DED 0/0 met, out of pocket $3,600/0 met, co-insurance 0%. no pre-authorization required. Passport, 04/26/2021@3 :37pm, REF# [id number]

## 2021-05-01 ENCOUNTER — Other Ambulatory Visit: Payer: Self-pay

## 2021-05-01 ENCOUNTER — Other Ambulatory Visit: Payer: Medicare Other | Admitting: *Deleted

## 2021-05-01 DIAGNOSIS — E785 Hyperlipidemia, unspecified: Secondary | ICD-10-CM | POA: Diagnosis not present

## 2021-05-01 DIAGNOSIS — I25118 Atherosclerotic heart disease of native coronary artery with other forms of angina pectoris: Secondary | ICD-10-CM | POA: Diagnosis not present

## 2021-05-01 LAB — LIPID PANEL
Chol/HDL Ratio: 2.7 ratio (ref 0.0–5.0)
Cholesterol, Total: 137 mg/dL (ref 100–199)
HDL: 50 mg/dL (ref >39)
LDL Chol Calc (NIH): 74 mg/dL (ref 0–99)
Triglycerides: 62 mg/dL (ref 0–149)
VLDL Cholesterol Cal: 13 mg/dL (ref 5–40)

## 2021-05-01 LAB — HEPATIC FUNCTION PANEL
ALT: 32 IU/L (ref 0–44)
AST: 31 IU/L (ref 0–40)
Albumin: 4.4 g/dL (ref 3.8–4.8)
Alkaline Phosphatase: 101 IU/L (ref 44–121)
Bilirubin Total: 0.4 mg/dL (ref 0.0–1.2)
Bilirubin, Direct: 0.12 mg/dL (ref 0.00–0.40)
Total Protein: 7.5 g/dL (ref 6.0–8.5)

## 2021-05-02 ENCOUNTER — Telehealth: Payer: Self-pay | Admitting: Interventional Cardiology

## 2021-05-02 NOTE — Telephone Encounter (Signed)
Patient is on day 9 of a migraine. At this point in his Migraine cycles, he gots to the ED and gets IV Reglan as a treatment for his migraine.  This is the first time that he has had a migraine reach this point while under Dr. Hassell Done care. He wanted to know if the medications he is on would interact with the Reglan. Please advise

## 2021-05-02 NOTE — Telephone Encounter (Signed)
No interactions with cardiac medications and Reglan.  However, patient is on escitalopram which is contraindicated with Reglan.  Concurrent use of METOCLOPRAMIDE and SSRIs may result in an increased risk of extrapyramidal reactions and neuroleptic malignant syndrome.

## 2021-05-02 NOTE — Telephone Encounter (Signed)
I spoke with patient and gave him information from pharmacist.  Patient would like to know if Reglan causes any cardiac side effects

## 2021-05-02 NOTE — Telephone Encounter (Signed)
Patient notified

## 2021-05-02 NOTE — Telephone Encounter (Signed)
It can but they are not extremely common.    Cardiovascular: Congestive heart failure; increased risk of fluid retention and volume overload due to transient increases in plasma aldosterone; discontinuation may be required [  Cardiovascular: Hypertension has been reported; avoid use in patients with hypertension

## 2021-05-08 ENCOUNTER — Other Ambulatory Visit: Payer: Self-pay

## 2021-05-08 ENCOUNTER — Ambulatory Visit: Payer: Medicare Other

## 2021-05-08 DIAGNOSIS — Z5181 Encounter for therapeutic drug level monitoring: Secondary | ICD-10-CM

## 2021-05-08 DIAGNOSIS — I513 Intracardiac thrombosis, not elsewhere classified: Secondary | ICD-10-CM

## 2021-05-08 DIAGNOSIS — Z131 Encounter for screening for diabetes mellitus: Secondary | ICD-10-CM | POA: Diagnosis not present

## 2021-05-08 DIAGNOSIS — Z13220 Encounter for screening for lipoid disorders: Secondary | ICD-10-CM | POA: Diagnosis not present

## 2021-05-08 LAB — POCT INR: INR: 3 (ref 2.0–3.0)

## 2021-05-08 NOTE — Patient Instructions (Signed)
Description   DO NOT HIGHLIGHT PAPERS & GIVE PRINTED PAPERWORK TO WIFE-THESE THINGS TRIGGER MIGRAINES. Continue on same dosage 1/2 tablet daily except 1 tablet on Tuesdays, Thursdays and Saturdays.Continue eating leafy veggies daily as normal. Recheck INR in 3 weeks. [facility] [phone] Main [phone]

## 2021-05-09 ENCOUNTER — Encounter: Payer: Self-pay | Admitting: Interventional Cardiology

## 2021-05-09 ENCOUNTER — Encounter: Payer: Self-pay | Admitting: Neurology

## 2021-05-17 ENCOUNTER — Telehealth (HOSPITAL_COMMUNITY): Payer: Self-pay | Admitting: *Deleted

## 2021-05-17 NOTE — Telephone Encounter (Signed)
Left message to call cardiac rehab regarding orientation appointment.Raeli Wiens Walden Donnae Michels RN BSN  ?

## 2021-05-18 ENCOUNTER — Encounter (HOSPITAL_COMMUNITY): Payer: Self-pay

## 2021-05-18 ENCOUNTER — Telehealth (HOSPITAL_COMMUNITY): Payer: Self-pay | Admitting: *Deleted

## 2021-05-18 ENCOUNTER — Other Ambulatory Visit (HOSPITAL_COMMUNITY): Payer: Self-pay | Admitting: *Deleted

## 2021-05-18 ENCOUNTER — Telehealth: Payer: Self-pay | Admitting: Cardiology

## 2021-05-18 ENCOUNTER — Telehealth: Payer: Self-pay | Admitting: Neurology

## 2021-05-18 ENCOUNTER — Encounter (HOSPITAL_COMMUNITY)
Admission: RE | Admit: 2021-05-18 | Discharge: 2021-05-18 | Disposition: A | Discharge status: Home / Self Care | Payer: Medicare Other | Source: Ambulatory Visit | Attending: Interventional Cardiology | Admitting: Interventional Cardiology

## 2021-05-18 ENCOUNTER — Other Ambulatory Visit: Payer: Self-pay | Admitting: Cardiology

## 2021-05-18 ENCOUNTER — Other Ambulatory Visit: Payer: Self-pay

## 2021-05-18 VITALS — BP 114/70 | HR 63 | Ht 66.0 in | Wt 187.4 lb

## 2021-05-18 DIAGNOSIS — I493 Ventricular premature depolarization: Secondary | ICD-10-CM

## 2021-05-18 DIAGNOSIS — Z955 Presence of coronary angioplasty implant and graft: Secondary | ICD-10-CM | POA: Insufficient documentation

## 2021-05-18 HISTORY — DX: Atherosclerotic heart disease of native coronary artery without angina pectoris: I25.10

## 2021-05-18 NOTE — Telephone Encounter (Signed)
[facility] Verdis Frederickson) asking for clearance to receive exercise at Cardiac Rehab. Would like a call from the nurse.

## 2021-05-18 NOTE — Telephone Encounter (Signed)
Left message to call cardiac rehab.Barnet Pall, RN,BSN 05/18/2021 8:47 AM

## 2021-05-18 NOTE — Progress Notes (Signed)
Kyle Walters was noted to have trigeminal and quadrigeminal PVC's at the end of his 6 minute walk test nonsustained.Kyle Walters said that he had a migraine triggered by the purple sani wipes used to clean the equipment in the gym. Blood pressure 132/60 immediately 6 minute post walk test. Patient denied having palpitations but reported having a headache. Patient went into the treatment room and turned the lights off wife present as she drove him to the appointment. Mickel Baas called and notified about ectopy. Mickel Baas said that she will order lab work. The office will call the patient. Will fax exercise flow sheets to Dr. Hassell Done office for review with today's ECG tracings.Harrell Gave RN BSN

## 2021-05-18 NOTE — Telephone Encounter (Signed)
-----   Message from Melvenia Beam, MD sent at 05/18/2021  3:43 PM EST ----- Regarding: RE: cardiac rehab I have no concerns, thanks!  ----- Message ----- From: Magda Kiel, RN Sent: 05/18/2021   2:15 PM EST To: Melvenia Beam, MD Subject: cardiac rehab                                  Good afternoon Dr Jaynee Eagles,  Mr Arriaga is here for orientation today for cardiac rehab  as he is status post DES RCA, 03/14/21 per Dr Irish Lack.  I wanted to know if you are okay with Mr Bisping proceeding with exercise? Do you have an concerns or restrictions as he is being actively treated by you for his migraines? Mr Kallal is scheduled for an infusion tomorrow and see you again in the office on 06/01/21.   Thanks for your input!  Sincerely,  Barnet Pall RN Cardiac Rehab  [name] is supposed to being exercise on Monday and will tentatively complete cardiac rehab on 07/14/21

## 2021-05-18 NOTE — Telephone Encounter (Signed)
Chart reviewed, patient was seen by Dr. Jaynee Eagles for chronic migraine headaches, is receiving Botox injections as migraine prevention  CT head without contrast February 2022 showed no acute abnormality  However he is also under the care of cardiologist Dr.  Team Member Role and Specialty Contact Info Address Start End Comments  Jettie Booze, MD Cardiology (Cardiology) Phone: [phone] Fax: [phone]  [address] [place] [address] 10/17/2020 - -   1. Normal LV size with EF 51%. Apical aneurysm with layered LV thrombus present.   2.  Normal RV size and systolic function, EF 15%.   3. Near transmural LGE involving the true apex and the peri-apical segments. This area is unlikely to improve with revasculariziation (not viable). The remainder of the LV has minimal LGE and appears viable.    They should contact his cardiology for clearance.

## 2021-05-18 NOTE — Telephone Encounter (Signed)
Pt with freq PVCs at cardiac rehab today for orientation.  He also had migraine this morning.  Will check BMP and Mg+ level but no chest pain no SOB.  Stent placed 03/14/21.

## 2021-05-18 NOTE — Progress Notes (Signed)
Cardiac Individual Treatment Plan  Patient Details  Name: Kyle Walters MRN: [id number] Date of Birth: 04/21/1956 Referring Provider:   Flowsheet Row CARDIAC REHAB PHASE II ORIENTATION from 05/18/2021 in [place]  Referring Provider Dr. Larae Grooms, MD       Initial Encounter Date:  [place] from 05/18/2021 in [place]  Date 05/18/21       Visit Diagnosis: 03/14/21 S/P DESx2 RCA  Patient's Home Medications on Admission:  Current Outpatient Medications:    ALPRAZolam (XANAX) 1 MG tablet, Take 1 mg by mouth 4 (four) times daily. , Disp: , Rfl:    B Complex-Biotin-FA (B-50 COMPLEX PO), Take 1 capsule by mouth daily., Disp: , Rfl:    Botulinum Toxin Type A (BOTOX) 200 units SOLR, Provider to inject 155 units into the muscles of the head and neck every 3 months. Discard remainder., Disp: 1 each, Rfl: 3   BREO ELLIPTA 100-25 MCG/INH AEPB, INHALE 1 PUFF BY MOUTH EVERY DAY, Disp: 60 each, Rfl: 10   Cholecalciferol (VITAMIN D3) 2000 UNITS TABS, Take 4,000 Units by mouth daily after breakfast., Disp: , Rfl:    clopidogrel (PLAVIX) 75 MG tablet, Take 1 tablet (75 mg total) by mouth daily with breakfast., Disp: 90 tablet, Rfl: 1   doxazosin (CARDURA) 8 MG tablet, Take 8 mg by mouth daily with supper., Disp: , Rfl:    Eptinezumab-jjmr (VYEPTI) 100 MG/ML injection, Inject 3 mLs (300 mg total) into the vein every 3 (three) months., Disp: 3 mL, Rfl: 3   escitalopram (LEXAPRO) 20 MG tablet, Take 10 mg by mouth in the morning., Disp: , Rfl: 2   Ferrous Sulfate (IRON PO), Take 85 mg by mouth daily after breakfast. Hema Plex Iron, Disp: , Rfl:    gabapentin (NEURONTIN) 400 MG capsule, Take 400 mg by mouth 4 (four) times daily. , Disp: , Rfl:    MAGNESIUM MALATE PO, Take 400 mg by mouth daily after breakfast., Disp: , Rfl:    memantine (NAMENDA) 10 MG tablet, Take 20 mg in the morning and 10  mg in the late afternoon (Patient taking differently: Take 10-20 mg by mouth See admin instructions. Take 20 mg in the morning and 10 mg by mouth with supper), Disp: 90 tablet, Rfl: 0   Multiple Vitamin (MULTIVITAMIN WITH MINERALS) TABS tablet, Take 1 tablet by mouth daily after breakfast. Centrum Silver for Men, Disp: , Rfl:    Nerve Stimulator (NERIVIO) DEVI, USE WITHIN 1 HR OF MIGRAINE. SET A STRONG YET COMFORTABLE INTENSITY LEVEL & MAINTAIN LEVEL FOR 45 MINS, Disp: 1 each, Rfl: 12   nicotine (NICODERM CQ - DOSED IN MG/24 HOURS) 14 mg/24hr patch, Place 14 mg onto the skin daily., Disp: , Rfl:    nitroGLYCERIN (NITROSTAT) 0.4 MG SL tablet, Place 1 tablet (0.4 mg total) under the tongue every 5 (five) minutes as needed., Disp: 25 tablet, Rfl: 2   Omega-3 Fatty Acids (FISH OIL) 1200 MG CAPS, Take 1,200 mg by mouth daily after breakfast., Disp: , Rfl:    pantoprazole (PROTONIX) 20 MG tablet, Take 1 tablet (20 mg total) by mouth daily., Disp: 90 tablet, Rfl: 3   phenylephrine-shark liver oil-mineral oil-petrolatum (PREPARATION H) 0.25-14-74.9 % rectal ointment, Place 1 application rectally 2 (two) times daily as needed for hemorrhoids., Disp: , Rfl:    rosuvastatin (CRESTOR) 20 MG tablet, Take 1 tablet (20 mg total) by mouth daily., Disp: 90 tablet, Rfl: 3  silodosin (RAPAFLO) 8 MG CAPS capsule, Take 8 mg by mouth daily with breakfast., Disp: , Rfl:    Ubrogepant (UBRELVY) 100 MG TABS, TAKE 1 TABLET MOUTH AS NEEDED AS CLOSE TO MIGRAINE ONSET AS POSSIBLE. MAY REPEAT IN 2 HOURS IF NEEDED. MAX 200 MG PER 24 HOURS., Disp: 10 tablet, Rfl: 3   vitamin B-12 (CYANOCOBALAMIN) 500 MCG tablet, Take 500 mcg by mouth daily., Disp: , Rfl:    warfarin (COUMADIN) 5 MG tablet, Take 1/2 to 1 tablet by mouth once daily as directed by Anticoagulation Clinic (Patient taking differently: Take 2.5-5 mg by mouth See admin instructions. Take 1/2 to 1 tablet by mouth once daily as directed by Anticoagulation Clinic; 5 mg Tues  Thurs and Sat, and 2.5 mg all other days), Disp: 30 tablet, Rfl: 2  Past Medical History: Past Medical History:  Diagnosis Date   ADHD (attention deficit hyperactivity disorder)    Anemia    Anxiety panic attack   Arthritis    COPD (chronic obstructive pulmonary disease) (HCC)    bronchitis/emphysema   Coronary artery disease    Depression    Emphysema lung (HCC)    Enlarged prostate    Fibromyalgia    GERD (gastroesophageal reflux disease)    Hyperlipidemia    Hypertension    IBS (irritable bowel syndrome)    Migraine    Panic attack     Tobacco Use: Social History   Tobacco Use  Smoking Status Former   Packs/day: 1.50   Years: 40.00   Pack years: 60.00   Types: Cigarettes   Quit date: 05/17/2013   Years since quitting: 8.0  Smokeless Tobacco Never  Tobacco Comments   Patient wears a nicotine patch daily     Labs: Recent Review Flowsheet Data     Labs for ITP Cardiac and Pulmonary Rehab Latest Ref Rng & Units 03/30/2009 09/30/2012 05/01/2021   Cholestrol 100 - 199 mg/dL - - 137   LDLCALC 0 - 99 mg/dL - - 74   HDL >39 mg/dL - - 50   Trlycerides 0 - 149 mg/dL - - 62   TCO2 0 - 100 mmol/L 30 28 -       Capillary Blood Glucose: No results found for: GLUCAP   Exercise Target Goals: Exercise Program Goal: Individual exercise prescription set using results from initial 6 min walk test and THRR while considering  patients activity barriers and safety.   Exercise Prescription Goal: Starting with aerobic activity 30 plus minutes a day, 3 days per week for initial exercise prescription. Provide home exercise prescription and guidelines that participant acknowledges understanding prior to discharge.  Activity Barriers & Risk Stratification:  Activity Barriers & Cardiac Risk Stratification - 05/18/21 1516       Activity Barriers & Cardiac Risk Stratification   Activity Barriers Arthritis;Neck/Spine Problems;Chest Pain/Angina;Back Problems;Balance  Concerns;Deconditioning;Joint Problems    Cardiac Risk Stratification High             6 Minute Walk:  6 Minute Walk     Row Name 05/18/21 1507         6 Minute Walk   Phase Initial     Distance 1200 feet     Walk Time 6 minutes     # of Rest Breaks 0     MPH 2.27     METS 2.65     RPE 12     Perceived Dyspnea  0     VO2 Peak 9.28     Symptoms  Yes (comment)     Comments Migraine Triggered by sani wipe. Manageable with rest     Resting HR 63 bpm     Resting BP 114/70     Resting Oxygen Saturation  94 %     Exercise Oxygen Saturation  during 6 min walk 95 %     Max Ex. HR 87 bpm     Max Ex. BP 132/62     2 Minute Post BP 118/62              Oxygen Initial Assessment:   Oxygen Re-Evaluation:   Oxygen Discharge (Final Oxygen Re-Evaluation):   Initial Exercise Prescription:  Initial Exercise Prescription - 05/18/21 1500       Date of Initial Exercise RX and Referring Provider   Date 05/18/21    Referring Provider Dr. Larae Grooms, MD    Expected Discharge Date 07/14/21      NuStep   Level 1    SPM 80    Minutes 30    METs 1.9      Prescription Details   Frequency (times per week) 3    Duration Progress to 30 minutes of continuous aerobic without signs/symptoms of physical distress      Intensity   THRR 40-80% of Max Heartrate 62-123    Ratings of Perceived Exertion 11-13    Perceived Dyspnea 0-4      Progression   Progression Continue progressive overload as per policy without signs/symptoms or physical distress.      Resistance Training   Training Prescription Yes    Weight 3    Reps 10-15             Perform Capillary Blood Glucose checks as needed.  Exercise Prescription Changes:   Exercise Comments:   Exercise Goals and Review:   Exercise Goals     Row Name 05/18/21 1520             Exercise Goals   Increase Physical Activity Yes       Intervention Provide advice, education, support and counseling about  physical activity/exercise needs.;Develop an individualized exercise prescription for aerobic and resistive training based on initial evaluation findings, risk stratification, comorbidities and participant's personal goals.       Expected Outcomes Short Term: Attend rehab on a regular basis to increase amount of physical activity.;Long Term: Add in home exercise to make exercise part of routine and to increase amount of physical activity.;Long Term: Exercising regularly at least 3-5 days a week.       Increase Strength and Stamina Yes       Intervention Provide advice, education, support and counseling about physical activity/exercise needs.;Develop an individualized exercise prescription for aerobic and resistive training based on initial evaluation findings, risk stratification, comorbidities and participant's personal goals.       Expected Outcomes Short Term: Increase workloads from initial exercise prescription for resistance, speed, and METs.;Short Term: Perform resistance training exercises routinely during rehab and add in resistance training at home;Long Term: Improve cardiorespiratory fitness, muscular endurance and strength as measured by increased METs and functional capacity (6MWT)       Able to understand and use rate of perceived exertion (RPE) scale Yes       Intervention Provide education and explanation on how to use RPE scale       Expected Outcomes Short Term: Able to use RPE daily in rehab to express subjective intensity level;Long Term:  Able to use RPE to guide intensity level  when exercising independently       Knowledge and understanding of Target Heart Rate Range (THRR) Yes       Intervention Provide education and explanation of THRR including how the numbers were predicted and where they are located for reference       Expected Outcomes Short Term: Able to state/look up THRR;Long Term: Able to use THRR to govern intensity when exercising independently;Short Term: Able to use  daily as guideline for intensity in rehab       Understanding of Exercise Prescription Yes       Intervention Provide education, explanation, and written materials on patient's individual exercise prescription       Expected Outcomes Short Term: Able to explain program exercise prescription;Long Term: Able to explain home exercise prescription to exercise independently                Exercise Goals Re-Evaluation :    Discharge Exercise Prescription (Final Exercise Prescription Changes):   Nutrition:  Target Goals: Understanding of nutrition guidelines, daily intake of sodium 1500mg , cholesterol 200mg , calories 30% from fat and 7% or less from saturated fats, daily to have 5 or more servings of fruits and vegetables.  Biometrics:  Pre Biometrics - 05/18/21 1521       Pre Biometrics   Height 5\' 6"  (1.676 m)    Weight 85 kg    Waist Circumference 42.5 inches    Hip Circumference 41.5 inches    Waist to Hip Ratio 1.02 %    BMI (Calculated) 30.26    Triceps Skinfold 20 mm    % Body Fat 31.1 %    Grip Strength 38 kg    Flexibility 11.5 in    Single Leg Stand 15.8 seconds              Nutrition Therapy Plan and Nutrition Goals:   Nutrition Assessments:  MEDIFICTS Score Key: ?70 Need to make dietary changes  40-70 Heart Healthy Diet ? 40 Therapeutic Level Cholesterol Diet   Picture Your Plate Scores: <27 Unhealthy dietary pattern with much room for improvement. 41-50 Dietary pattern unlikely to meet recommendations for good health and room for improvement. 51-60 More healthful dietary pattern, with some room for improvement.  >60 Healthy dietary pattern, although there may be some specific behaviors that could be improved.    Nutrition Goals Re-Evaluation:   Nutrition Goals Discharge (Final Nutrition Goals Re-Evaluation):   Psychosocial: Target Goals: Acknowledge presence or absence of significant depression and/or stress, maximize coping skills,  provide positive support system. Participant is able to verbalize types and ability to use techniques and skills needed for reducing stress and depression.  Initial Review & Psychosocial Screening:  Initial Psych Review & Screening - 05/18/21 1700       Initial Review   Current issues with Current Depression;History of Depression;Current Psychotropic Meds;Current Anxiety/Panic;Current Stress Concerns    Source of Stress Concerns Family;Chronic Illness;Unable to participate in former interests or hobbies;Unable to perform yard/household activities;Poor Coping Skills    Comments Currently depressed has PTSD, anxiety, panic attacks      Family Dynamics   Good Support System? Yes   Lives with wife   Concerns Inappropriate over/under dependence on family/friends    Comments Patient is under the care of a neurologist and would benefit from couselling      Barriers   Psychosocial barriers to participate in program The patient should benefit from training in stress management and relaxation.;Psychosocial barriers identified (see note)  Screening Interventions   Interventions Provide feedback about the scores to participant;To provide support and resources with identified psychosocial needs;Encouraged to exercise    Expected Outcomes Short Term goal: Utilizing psychosocial counselor, staff and physician to assist with identification of specific Stressors or current issues interfering with healing process. Setting desired goal for each stressor or current issue identified.;Long Term Goal: Stressors or current issues are controlled or eliminated.;Short Term goal: Identification and review with participant of any Quality of Life or Depression concerns found by scoring the questionnaire.;Long Term goal: The participant improves quality of Life and PHQ9 Scores as seen by post scores and/or verbalization of changes             Quality of Life Scores:  Quality of Life - 05/18/21 1555        Quality of Life   Select Quality of Life      Quality of Life Scores   Health/Function Pre 9.79 %    Socioeconomic Pre 10.86 %    Psych/Spiritual Pre 12 %    Family Pre 12 %    GLOBAL Pre 10.82 %            Scores of 19 and below usually indicate a poorer quality of life in these areas.  A difference of  2-3 points is a clinically meaningful difference.  A difference of 2-3 points in the total score of the Quality of Life Index has been associated with significant improvement in overall quality of life, self-image, physical symptoms, and general health in studies assessing change in quality of life.  PHQ-9: Recent Review Flowsheet Data   There is no flowsheet data to display.    Interpretation of Total Score  Total Score Depression Severity:  1-4 = Minimal depression, 5-9 = Mild depression, 10-14 = Moderate depression, 15-19 = Moderately severe depression, 20-27 = Severe depression   Psychosocial Evaluation and Intervention:   Psychosocial Re-Evaluation:   Psychosocial Discharge (Final Psychosocial Re-Evaluation):   Vocational Rehabilitation: Provide vocational rehab assistance to qualifying candidates.   Vocational Rehab Evaluation & Intervention:  Vocational Rehab - 05/18/21 1705       Initial Vocational Rehab Evaluation & Intervention   Assessment shows need for Vocational Rehabilitation No   Kyle Walters is disabled and does not need vocational rehab at this time.            Education: Education Goals: Education classes will be provided on a weekly basis, covering required topics. Participant will state understanding/return demonstration of topics presented.  Learning Barriers/Preferences:  Learning Barriers/Preferences - 05/18/21 1523       Learning Barriers/Preferences   Learning Barriers Sight;Reading;Exercise Concerns   Pt has migraine triggers that can cause dizzy spells, memory loss and occasionally sight issues   Learning Preferences Individual  Instruction;Pictoral;Skilled Demonstration             Education Topics: Hypertension, Hypertension Reduction -Define heart disease and high blood pressure. Discus how high blood pressure affects the body and ways to reduce high blood pressure.   Exercise and Your Heart -Discuss why it is important to exercise, the FITT principles of exercise, normal and abnormal responses to exercise, and how to exercise safely.   Angina -Discuss definition of angina, causes of angina, treatment of angina, and how to decrease risk of having angina.   Cardiac Medications -Review what the following cardiac medications are used for, how they affect the body, and side effects that may occur when taking the medications.  Medications include Aspirin, Beta  blockers, calcium channel blockers, ACE Inhibitors, angiotensin receptor blockers, diuretics, digoxin, and antihyperlipidemics.   Congestive Heart Failure -Discuss the definition of CHF, how to live with CHF, the signs and symptoms of CHF, and how keep track of weight and sodium intake.   Heart Disease and Intimacy -Discus the effect sexual activity has on the heart, how changes occur during intimacy as we age, and safety during sexual activity.   Smoking Cessation / COPD -Discuss different methods to quit smoking, the health benefits of quitting smoking, and the definition of COPD.   Nutrition I: Fats -Discuss the types of cholesterol, what cholesterol does to the heart, and how cholesterol levels can be controlled.   Nutrition II: Labels -Discuss the different components of food labels and how to read food label   Heart Parts/Heart Disease and PAD -Discuss the anatomy of the heart, the pathway of blood circulation through the heart, and these are affected by heart disease.   Stress I: Signs and Symptoms -Discuss the causes of stress, how stress may lead to anxiety and depression, and ways to limit stress.   Stress II:  Relaxation -Discuss different types of relaxation techniques to limit stress.   Warning Signs of Stroke / TIA -Discuss definition of a stroke, what the signs and symptoms are of a stroke, and how to identify when someone is having stroke.   Knowledge Questionnaire Score:   Core Components/Risk Factors/Patient Goals at Admission:  Personal Goals and Risk Factors at Admission - 05/18/21 1527       Core Components/Risk Factors/Patient Goals on Admission    Weight Management Yes    Intervention Weight Management: Develop a combined nutrition and exercise program designed to reach desired caloric intake, while maintaining appropriate intake of nutrient and fiber, sodium and fats, and appropriate energy expenditure required for the weight goal.;Weight Management: Provide education and appropriate resources to help participant work on and attain dietary goals.;Weight Management/Obesity: Establish reasonable short term and long term weight goals.;Obesity: Provide education and appropriate resources to help participant work on and attain dietary goals.    Admit Weight 187 lb 6.3 oz (85 kg)    Expected Outcomes Short Term: Continue to assess and modify interventions until short term weight is achieved;Long Term: Adherence to nutrition and physical activity/exercise program aimed toward attainment of established weight goal;Weight Loss: Understanding of general recommendations for a balanced deficit meal plan, which promotes 1-2 lb weight loss per week and includes a negative energy balance of [phone] kcal/d;Understanding recommendations for meals to include 15-35% energy as protein, 25-35% energy from fat, 35-60% energy from carbohydrates, less than 200mg  of dietary cholesterol, 20-35 gm of total fiber daily;Understanding of distribution of calorie intake throughout the day with the consumption of 4-5 meals/snacks    Hypertension Yes    Intervention Provide education on lifestyle modifcations including  regular physical activity/exercise, weight management, moderate sodium restriction and increased consumption of fresh fruit, vegetables, and low fat dairy, alcohol moderation, and smoking cessation.;Monitor prescription use compliance.    Expected Outcomes Short Term: Continued assessment and intervention until BP is < 140/60mm HG in hypertensive participants. < 130/27mm HG in hypertensive participants with diabetes, heart failure or chronic kidney disease.;Long Term: Maintenance of blood pressure at goal levels.    Lipids Yes    Intervention Provide education and support for participant on nutrition & aerobic/resistive exercise along with prescribed medications to achieve LDL 70mg , HDL >40mg .    Expected Outcomes Short Term: Participant states understanding of desired cholesterol values and  is compliant with medications prescribed. Participant is following exercise prescription and nutrition guidelines.;Long Term: Cholesterol controlled with medications as prescribed, with individualized exercise RX and with personalized nutrition plan. Value goals: LDL < 70mg , HDL > 40 mg.    Stress Yes    Intervention Offer individual and/or small group education and counseling on adjustment to heart disease, stress management and health-related lifestyle change. Teach and support self-help strategies.;Refer participants experiencing significant psychosocial distress to appropriate mental health specialists for further evaluation and treatment. When possible, include family members and significant others in education/counseling sessions.    Expected Outcomes Short Term: Participant demonstrates changes in health-related behavior, relaxation and other stress management skills, ability to obtain effective social support, and compliance with psychotropic medications if prescribed.;Long Term: Emotional wellbeing is indicated by absence of clinically significant psychosocial distress or social isolation.    Personal Goal  Other Yes    Personal Goal Long and short the same: lose wt and strengthen heart muscle    Intervention Will continue to monitor pt and progress workloads as tolerated without sign or symptom    Expected Outcomes Pt will achieve his goals             Core Components/Risk Factors/Patient Goals Review:    Core Components/Risk Factors/Patient Goals at Discharge (Final Review):    ITP Comments:  ITP Comments     Row Name 05/18/21 1645           ITP Comments Dr Fransico Him MD, Medical Director                Comments: Kyle Walters attended orientation on 05/18/2021 to review rules and guidelines for program.  Completed 6 minute walk test, Intitial ITP, and exercise prescription.  VSS. Telemetry-Sinus Rhythm, patient had nonsustained run of trigeminy, quadrigeminy triggered by a migraine  due to exposure to cleaning wipes in the gym see previous note. Safety measures and social distancing in place per CDC guidelines. Will obtain clearance from Dr Jaynee Eagles, [name]'s neurologist to exercise.Harrell Gave RN BSN

## 2021-05-18 NOTE — Telephone Encounter (Signed)
I called Kyle Walters back at [phone] and LVM with pt's name and DOB and our phone number. Advised that we see patient only for migraines. Message was sent over to Dr Krista Blue, work-in MD, as Dr Jaynee Eagles is out of the office. Dr Krista Blue has deferred the exercise clearance over to cardiology. Provided our office hours should she need to call back. Wanted to clarify if they had called the correct office, reiterated to call cardiology for clearance for exercise with rehab.

## 2021-05-18 NOTE — Progress Notes (Signed)
Cardiac Rehab Medication Review by a Nurse  Does the patient  feel that his/her medications are working for him/her?   NO  Has the patient been experiencing any side effects to the medications prescribed?  NO  Does the patient measure his/her own blood pressure or blood glucose at home?  NO  Does the patient have any problems obtaining medications due to transportation or finances?    NO  Understanding of regimen: excellent Understanding of indications: excellent Potential of compliance: excellent    Nurse  comments: Kyle Walters is taking his medications as prescribed and has a good understanding of what his medications are for. Kyle Walters has continued chronic migraines and has a hard time with daily management. Kyle Walters does not check his blood pressures at home on a daily basis.      Harrell Gave RN 05/18/2021 4:38 PM

## 2021-05-18 NOTE — Telephone Encounter (Signed)
Spoke with [employer]. Confirmed afternoon appointment. Completed health history over the phone. Covid Screening completed.Harrell Gave RN BSN

## 2021-05-18 NOTE — Telephone Encounter (Signed)
Upon further review in the chart, I see a note from [place] who had spoken with Dr Jaynee Eagles via chat and she does not have any concerns with the pt doing cardiac rehab.

## 2021-05-19 ENCOUNTER — Other Ambulatory Visit: Payer: Medicare Other | Admitting: *Deleted

## 2021-05-19 ENCOUNTER — Ambulatory Visit (HOSPITAL_COMMUNITY)
Admission: RE | Admit: 2021-05-19 | Discharge: 2021-05-19 | Disposition: A | Discharge status: Home / Self Care | Payer: Medicare Other | Source: Ambulatory Visit | Attending: Neurology | Admitting: Neurology

## 2021-05-19 ENCOUNTER — Telehealth: Payer: Self-pay | Admitting: Interventional Cardiology

## 2021-05-19 DIAGNOSIS — G43711 Chronic migraine without aura, intractable, with status migrainosus: Secondary | ICD-10-CM | POA: Insufficient documentation

## 2021-05-19 DIAGNOSIS — I493 Ventricular premature depolarization: Secondary | ICD-10-CM | POA: Diagnosis not present

## 2021-05-19 LAB — BASIC METABOLIC PANEL
BUN/Creatinine Ratio: 13 (ref 10–24)
BUN: 14 mg/dL (ref 8–27)
CO2: 28 mmol/L (ref 20–29)
Calcium: 10 mg/dL (ref 8.6–10.2)
Chloride: 100 mmol/L (ref 96–106)
Creatinine, Ser: 1.07 mg/dL (ref 0.76–1.27)
Glucose: 88 mg/dL (ref 70–99)
Potassium: 4.1 mmol/L (ref 3.5–5.2)
Sodium: 140 mmol/L (ref 134–144)
eGFR: 77 mL/min/{1.73_m2} (ref >59)

## 2021-05-19 LAB — MAGNESIUM: Magnesium: 2 mg/dL (ref 1.6–2.3)

## 2021-05-19 MED ORDER — SODIUM CHLORIDE 0.9 % IV SOLN
300.0000 mg | Freq: Once | INTRAVENOUS | Status: AC
  Administered 2021-05-19: 300 mg via INTRAVENOUS
  Filled 2021-05-19: qty 3

## 2021-05-19 MED ORDER — SODIUM CHLORIDE 0.9 % IV SOLN: 300.0000 mg | Freq: Once | INTRAVENOUS | Status: DC

## 2021-05-19 NOTE — Telephone Encounter (Signed)
-----   Message from Melvenia Beam, MD sent at 05/18/2021  3:43 PM EST ----- Regarding: RE: cardiac rehab I have no concerns, thanks!   ----- Message ----- From: Magda Kiel, RN Sent: 05/18/2021   2:15 PM EST To: Melvenia Beam, MD Subject: cardiac rehab                                   Good afternoon Dr Jaynee Eagles,   Mr Empey is here for orientation today for cardiac rehab  as he is status post DES RCA, 03/14/21 per Dr Irish Lack.   I wanted to know if you are okay with Mr Biggins proceeding with exercise? Do you have an concerns or restrictions as he is being actively treated by you for his migraines? Mr Humphres is scheduled for an infusion tomorrow and see you again in the office on 06/01/21.     Thanks for your input!   Sincerely,   Barnet Pall RN Cardiac Rehab   [name] is supposed to being exercise on Monday and will tentatively complete cardiac rehab on 07/14/21

## 2021-05-19 NOTE — Telephone Encounter (Signed)
Spoke with the patient who states that when he was at cardiac rehab orientation yesterday he got a bad migraine from the cleaning solutions that were being used. He states that he was also doing the 6 minute baseline walking test and he had a few skipped beats. He states that because of this they had him come in today for lab work. Advised patient that Dr. Irish Lack and his Neurologist Dr. Jaynee Eagles are aware and have cleared him to continue with cardiac rehab. Advised that his lab results are not back yet and that we would give him a call once they are back and have been reviewed by Dr. Irish Lack.

## 2021-05-19 NOTE — Telephone Encounter (Signed)
° °  Pt is requesting to get a call back from Dr. Hassell Done nurse regarding his lab work done today

## 2021-05-19 NOTE — Telephone Encounter (Signed)
Pt had BMP and Mg level drawn here today (05/19/21).  Results are not available at this time.

## 2021-05-21 ENCOUNTER — Encounter: Payer: Self-pay | Admitting: Interventional Cardiology

## 2021-05-22 ENCOUNTER — Telehealth (HOSPITAL_COMMUNITY): Payer: Self-pay | Admitting: Internal Medicine

## 2021-05-22 ENCOUNTER — Other Ambulatory Visit (HOSPITAL_COMMUNITY): Payer: Self-pay

## 2021-05-22 ENCOUNTER — Encounter (HOSPITAL_COMMUNITY): Payer: Medicare Other

## 2021-05-24 ENCOUNTER — Other Ambulatory Visit: Payer: Self-pay

## 2021-05-24 ENCOUNTER — Encounter (HOSPITAL_COMMUNITY)
Admission: RE | Admit: 2021-05-24 | Discharge: 2021-05-24 | Disposition: A | Discharge status: Home / Self Care | Payer: Medicare Other | Source: Ambulatory Visit | Attending: Interventional Cardiology | Admitting: Interventional Cardiology

## 2021-05-24 DIAGNOSIS — Z955 Presence of coronary angioplasty implant and graft: Secondary | ICD-10-CM | POA: Insufficient documentation

## 2021-05-24 NOTE — Progress Notes (Addendum)
Daily Session Note  Patient Details  Name: Kyle Walters MRN: [id number] Date of Birth: [birth year] Referring Provider:   Flowsheet Row CARDIAC REHAB PHASE II ORIENTATION from 05/18/2021 in [place] [facility]  Referring Provider Dr. Larae Grooms, MD       Encounter Date: 05/24/2021  Check In:  Session Check In - 05/24/21 1456       Check-In   Supervising physician immediately available to respond to emergencies [facility] immediately available    Physician(s) Dr. Doristine Bosworth    Location [facility]    Staff Present Barnet Pall, RN, BSN;Jetta Walker BS, ACSM EP-C, Exercise Physiologist;Olinty Celesta Aver, MS, ACSM CEP, Exercise Physiologist;David Makemson, MS, ACSM-CEP, CCRP, Exercise Physiologist    Virtual Visit No    Medication changes reported     No    Fall or balance concerns reported    No    Tobacco Cessation No Change    Warm-up and Cool-down Performed as group-led instruction    Resistance Training Performed No    VAD Patient? No    PAD/SET Patient? No      Pain Assessment   Currently in Pain? No/denies    Pain Score 0-No pain    Multiple Pain Sites No             Capillary Blood Glucose: No results found for this or any previous visit (from the past 24 hour(s)).   Exercise Prescription Changes - 05/24/21 1642       Response to Exercise   Blood Pressure (Admit) 122/70    Blood Pressure (Exercise) 128/70    Blood Pressure (Exit) 102/66    Heart Rate (Admit) 63 bpm    Heart Rate (Exercise) 75 bpm    Heart Rate (Exit) 58 bpm    Rating of Perceived Exertion (Exercise) 11    Perceived Dyspnea (Exercise) 0    Symptoms c/o nausea when finished exercise. Vitals stable, resolved with rest    Comments Pt first day of exercise    Duration Progress to 30 minutes of  aerobic without signs/symptoms of physical distress    Intensity THRR unchanged      Progression   Progression Continue to progress workloads to  maintain intensity without signs/symptoms of physical distress.    Average METs 1.7      Resistance Training   Training Prescription No      NuStep   Level 1    SPM 80    Minutes 30    METs 1.7             Social History   Tobacco Use  Smoking Status Former   Packs/day: 1.50   Years: 40.00   Pack years: 60.00   Types: Cigarettes   Quit date: 05/17/2013   Years since quitting: 8.0  Smokeless Tobacco Never  Tobacco Comments   Patient wears a nicotine patch daily     Goals Met:  Exercise tolerated well  Goals Unmet:  Not Applicable  Comments: Kyle Walters started cardiac rehab today.  Pt tolerated light exercise without difficulty. VSS, telemetry-Sinus Rhythm, asymptomatic.  Medication list reconciled. Pt denies barriers to medicaiton compliance.  PSYCHOSOCIAL ASSESSMENT:  PHQ-12. Kyle Walters admits for being depressed and is currently taking an antidepressant. Kyle Walters has received counseling previously and may benefit from additional therapy. Will review quality of life questionnaire in the upcoming sessions. Kyle Walters has his wife for support who came with him to his session today    Pt enjoys writing novels.  Pt oriented to exercise equipment and routine. Kyle Walters reported feeling anxious and had a brief period of nausea after getting off the nustep this resolved quickly.  Understanding verbalized.Barnet Pall, RN,BSN 05/25/2021 8:43 AM    Dr. Fransico Him is Medical Director for Cardiac Rehab at [facility].

## 2021-05-26 ENCOUNTER — Encounter (HOSPITAL_COMMUNITY)
Admission: RE | Admit: 2021-05-26 | Discharge: 2021-05-26 | Disposition: A | Discharge status: Home / Self Care | Payer: Medicare Other | Source: Ambulatory Visit | Attending: Interventional Cardiology | Admitting: Interventional Cardiology

## 2021-05-26 ENCOUNTER — Other Ambulatory Visit: Payer: Self-pay

## 2021-05-26 DIAGNOSIS — Z955 Presence of coronary angioplasty implant and graft: Secondary | ICD-10-CM | POA: Diagnosis not present

## 2021-05-29 ENCOUNTER — Other Ambulatory Visit: Payer: Self-pay

## 2021-05-29 ENCOUNTER — Encounter (HOSPITAL_COMMUNITY)
Admission: RE | Admit: 2021-05-29 | Discharge: 2021-05-29 | Disposition: A | Discharge status: Home / Self Care | Payer: Medicare Other | Source: Ambulatory Visit | Attending: Interventional Cardiology | Admitting: Interventional Cardiology

## 2021-05-29 ENCOUNTER — Ambulatory Visit: Payer: Medicare Other

## 2021-05-29 DIAGNOSIS — I513 Intracardiac thrombosis, not elsewhere classified: Secondary | ICD-10-CM | POA: Diagnosis not present

## 2021-05-29 DIAGNOSIS — Z955 Presence of coronary angioplasty implant and graft: Secondary | ICD-10-CM

## 2021-05-29 DIAGNOSIS — Z5181 Encounter for therapeutic drug level monitoring: Secondary | ICD-10-CM

## 2021-05-29 LAB — POCT INR: INR: 2.8 (ref 2.0–3.0)

## 2021-05-29 NOTE — Patient Instructions (Signed)
Description   DO NOT HIGHLIGHT PAPERS & GIVE PRINTED PAPERWORK TO WIFE-THESE THINGS TRIGGER MIGRAINES. Continue on same dosage 1/2 tablet daily except 1 tablet on Tuesdays, Thursdays and Saturdays. Continue eating leafy veggies daily as normal. Recheck INR in 4 weeks. Anticoagulation Clinic [phone] Main [phone]

## 2021-05-29 NOTE — Progress Notes (Signed)
QUALITY OF LIFE SCORE REVIEW  Pt completed Quality of Life survey as a participant in Cardiac Rehab.  Scores 21.0 or below are considered low.  Pt score very low in all areas Overall 10.82, Health and Function 9.79, socioeconomic 10.86, physiological and spiritual 12.0, family 41. Patient quality of life slightly altered by physical constraints which limits ability to perform as prior to recent cardiac illness. Kyle Walters admits to being dissatisfied with his due to his recent cardiac stenting, chronic migraines and PTSD . Kyle Walters says he is seeing a therapist in the upcoming weeks and will let me know if he wants this information forwarded.  Offered emotional support and reassurance.  Will continue to monitor and intervene as necessary.Barnet Pall, RN,BSN 05/30/2021 8:04 AM

## 2021-05-31 ENCOUNTER — Telehealth (HOSPITAL_COMMUNITY): Payer: Self-pay | Admitting: Internal Medicine

## 2021-05-31 ENCOUNTER — Encounter (HOSPITAL_COMMUNITY): Payer: Medicare Other

## 2021-06-01 ENCOUNTER — Telehealth: Payer: Self-pay | Admitting: Neurology

## 2021-06-01 ENCOUNTER — Encounter: Payer: Self-pay | Admitting: Neurology

## 2021-06-01 ENCOUNTER — Telehealth: Payer: Self-pay | Admitting: *Deleted

## 2021-06-01 ENCOUNTER — Ambulatory Visit: Payer: Medicare Other | Admitting: Neurology

## 2021-06-01 VITALS — BP 123/70 | HR 64

## 2021-06-01 DIAGNOSIS — G43711 Chronic migraine without aura, intractable, with status migrainosus: Secondary | ICD-10-CM | POA: Diagnosis not present

## 2021-06-01 MED ORDER — UBRELVY 100 MG PO TABS: ORAL_TABLET | ORAL | 11 refills | Status: DC

## 2021-06-01 MED ORDER — MEMANTINE HCL 10 MG PO TABS: 10.0000 mg | ORAL_TABLET | Freq: Two times a day (BID) | ORAL | 4 refills | Status: DC

## 2021-06-01 NOTE — Telephone Encounter (Signed)
Pt wants to continue getting Ubrelvy through the [facility] patient assistance program. Application is complete and ready to be faxed. Started PA first if needed by insurance. Received this message:   This medication or product was previously approved on [id number] from 2021-04-23 to 2022-04-22. **Please note: This request was submitted electronically. Formulary lowering, tiering exception, cost reduction and/or pre-benefit determination review (including prospective Medicare hospice reviews) requests cannot be requested using this method of submission. Providers contact us at [phone] for further assistance.  I still have the previous faxed notification saying Roselyn Meier approved through 04/22/22. Will fax this with the application. Qulipta PA is still needed.

## 2021-06-01 NOTE — Telephone Encounter (Signed)
Spoke with Laverne @ [place] and canceled Vyepti effective immediately.

## 2021-06-01 NOTE — Telephone Encounter (Signed)
Referral sent to [facility] [phone].

## 2021-06-01 NOTE — Progress Notes (Signed)
[facility]    Provider:  Dr Jaynee Eagles Requesting Provider: Elwyn Reach, MD Primary Care Provider:  Elwyn Reach, MD  CC:  migraines  06/01/2021:  - Medications tried in the past include Emgality(worked great then stopped working, went up to 300 didn't help), Ajovy(Didn't work as well as others), Aimovig, Depakote, gabapentin, Topamax, memantine, Effexor, nortriptyline/TCAs, beta-blockers are contraindicated due to COPD, sumatriptan, Nurtec, aspirin, Lexapro, ketorolac injections, Mobic, Reglan injections, rizatriptan, sumatriptan, zonisamide, vyepti, qulipta, nurtec, ubrelvy and many others over the last 10 years, [name] therapy, Nerivio, cognitive behavioral therapy. Has not tried candesartan but his blood pressure is on the low side and usually this is cost prohibitive or we can't get it approved, been to therapy and psychiatry. Has not tried verapamil but again is blood pressure is on the lower side. He tried Cephaly. VNS was cost prohibitive.   Chronic daily headaches and migraines. He has tried many medications over the years for migraine, concomitant psychiatric hx and PTSD. I have explained I may not be able to help him anymore, we have tried, and he may need to go to an academic center or [facility] neurology for another opinion.  When patient comes, I usually allocate extra time for him in our because he talks a lot about his social stressors, stress at home, things that are going on in his life that may be affecting his migraines.   Today we again talked with him that he may have to get another pair of eyes on him, we can continue providing his Botox.   Today decided to: Stop Vyepti, [employer]; Continue Ubrelvy, memantine, nerivio,botox, gabapentin, magnesium, lexapro  Patient here alone and reports that This morning as he was ready to have his oatmeal and blueberries, while at the computer, he had gotten the blueberries, he was having a migraine. He mentioned   he didn't know he had migraines until 10 years ago, he started researching neuroplastic pain and somatic pain,was starting to feel optimistic He uses his nerivio, took [place] and took tylenol. Then something happened that upset him and the migraine worsened and he is not sure if he had a new migraines or an exacerbation of th existing migraine. He is still "out of it" and having pain and has a migraine. He bounces back and forth feeling optimistic, "working my way back from despair", no plans to hurt himself or others. He is feeling very trapped in his marriage. They were seeing a marriage therapist. The therapist was not giving them good advice, telling his wife she needs to think of them as a couple, they stopped seeing her and marital therapy is covered by his wife's therapy and having a hard time getting liz to remember to schedule a therapist. He has had migraines triggered by exposure to smells, he continues to have migraines exacerbated by interactions with his wife, they will talk about something and agree on what they are going to do then she doesn't do it and never tells him. He says it is extremely difficult being married to someone who is autistic. He is disabled and feels very frustrated by it and he is stuck in this situation. His vyepti may not be helping him and is very expensive. Emgality worked for 6 months. He suffers from agoraphobia and panic attacks.   04/11/2021: 3rd botox. He does not clench and used follow the pain protocol and put extra around the crown and right occipitalis of his head if needed. Marland KitchenHe is on [place]  and qulipta and vyepti. He has tried many medications , concomitant psychiatric hx and PTSD. I have explained I may not be able to help him and he may need to go to an academic center or try [facility]. He has been taking atogepant still even when we started vyepti, we had discussed stopping the atogepant. I am not aware of any contraindications to taking all of these  but I told him I am not sure how many cgrp medictions a patient should take at once. I recommended we try going off of atogepant but he says he is going through a bad patch of migraines and does not want to. I have contacted the Vyepti rep and asked her MSL to follow up on taking Vyepti with atogepant and ubrelvy. I checked Epocrates and there were no interactions in the interaction checker but advised patient I am just not sure if he should be taking qulipta with vyepti but since he has had no side effects we can continue while I ask Vyepti MSL for research.   01/10/2021; second Botox. He has chronic migraines without aura and those are doing better on botox. Still daily but less severe . He has episodic migraines with aura will prescribe qulipta. We see his wife [name]. Got the [place] approved. The nerivio helps some. He has been using it when having a new headache and the TMS helped some. Dry needling hurts but helps, he knew a PT who performs dry needling and that has helped some too.    01/23/2021: He returns today. We are performing botox on him every 12 weeks. We sent him to [place] therapy. We started qulipta. We also started nerivio. We see his wife [name]. Got the [place]. The nerivio helps some. He has been using it when having a new headache and the TMS helped some. Dry needling hurts but helps, he knew a PT who performs dry needling and that has helped some too. He is having memory problems. He forgets what month they are in. This can go on for months in the setting of headaches. The chronic pain bothers him less than his memory loss which he attributes to chronic pain. He has been on the [place] a few months. He was still having migraines on Qulipta but they were better. 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 The emgality was great then stopped being as great, then went up to 300, now back to 200 and no difference may just  have stopped working altogether. Roselyn Meier and Nerivio have worked well.   HPI:  Kyle Walters is a 66 y.o. male here as requested by Elwyn Reach, MD for headaches.  Past medical history panic disorder, COPD, depression, GERD, severe headaches and migraines.  I reviewed Dr. Leontine Locket notes: Headaches improved in the past but now they are back. Went to [facility] and had a bad experience.  Patient has tried Imitrex and and has other medical problems including ADHD and anxiety disorder and chronic allergies to fragrance, he continues to have multiple medical problems including allergies, abdominal discomfort with severe GERD, recurrent nausea, the last time he had a good meaningful relief of his headaches was when he went to the headache clinic at the [facility].  Patient is here alone and reports he has headaches and migraines, has had migraines since his 34s, they became severe about 9 years ago, he is on Botox and takes [employer]  cluster headache dose although he denies having cluster headaches.He saw a neurologist at the [facility], then the next neurologist started working for [facility] and was replaced by nurses. He went through a bad migraine cycle in May 30th of last year, he had trigger point injections, they would not do infusions, he cannot take prednisone.He feels he has lasting side effects from prednisone.He was in the ED and he was given reglan and toradol and his migraine cycle stopped. He has BPH so cannot have compazine or benadryl. We spoke about Tardive Dyskinesias and reglan.Migraines worsened 9 years ago.Smells can trigger severe migraines.Hif wife has autism and extreme anxiety. Finger nail polish always starts a severe migraine.He has developed osmophobia and now many smells "set me off". 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 He is going to try peppermint oil to try and find a masking agent to  put on his nose.His migraines causeing a lack of memory, in 2018 he went to the [place] [facility].Bright colors also bother him. He has nausea. He still has headaches almost every day, but this is still a significant improvement over prior, in 2018 he had severe daily migraines, now headaches are mild at least 80% improved in severity. He went in last week for reglan and toradol.He struggles with agoraphobia.  Reviewed notes, labs and imaging from outside physicians, which showed:   - Medications tried in the past include Emgality(worked great then stopped working, went up to 300 didn't help), Ajovy(Didn't work as well as others), Aimovig, Depakote, gabapentin, Topamax, memantine, Effexor, nortriptyline/TCAs, beta-blockers are contraindicated due to COPD, sumatriptan, Nurtec, aspirin, Lexapro, ketorolac injections, Mobic, Reglan injections, rizatriptan, sumatriptan, zonisamide, vyepti, qulipta, nurtec, ubrelvy and many others over the last 10 years, [place] therapy, Nerivio, cognitive behavioral therapy. Has not tried candesartan but his blood pressure is on the low side and usually this is cost prohibitive or we can't get it approved, been to therapy and psychiatry. Has not tried verapamil but again is blood pressure is on the lower side. He tried Cephaly. VNS was cost prohibitive.  I reviewed chart patient's been seen at the [facility] emergency room twice in the last several months, on June 11, 2020 he presented with headache and on July 05, 2020 he presented with migraine and headache.  I reviewed these records: In February he stated migraine started after triggered by a strong scent, history of neck spasms with the migraine, severe, with aura of visual loss and a small slice in his right visual field, on a "chronic migraine cycle" since last June receiving Botox injections from his neurologist, received relief after migraine cocktail.  He presented in March with headache for 6 days, throbbing,  typical for his migraines no relief with current medications.  Again felt better after migraine cocktail and was discharged home.  I also reviewed [place] [facility]'s notes, history of migraines that started in his late 47s, progressed to chronic migraines in 2012 with possible cluster headache, comorbid anxiety, depression, PTSD, panic attacks, ADD and COPD.  Appears she tried Ajovy and earlier this month switch back to [facility] which was effective previously.  Blood work collected December 29, 2019 shows a CMP with BUN 14 and creatinine 1.28 otherwise unremarkable,  CT head 06/11/2020: showed No acute intracranial abnormalities including mass lesion or mass effect, hydrocephalus, extra-axial fluid collection, midline shift, hemorrhage, or acute infarction, large ischemic events (personally reviewed images)  Review of Systems: Patient complains of  symptoms per HPI as well as the following symptoms: panic attacks, social stressors . Pertinent negatives and positives per HPI. All others negative     Social History   Socioeconomic History   Marital status: Married    Spouse name: [name]   Number of children: 1   Years of education: 18   Highest education level: Master's degree (e.g., MA, MS, MEng, MEd, MSW, MBA)  Occupational History   Occupation: disabled  Tobacco Use   Smoking status: Former    Packs/day: 1.50    Years: 40.00    Pack years: 60.00    Types: Cigarettes    Quit date: 05/17/2013    Years since quitting: 8.0   Smokeless tobacco: Never   Tobacco comments:    Patient wears a nicotine patch daily   Vaping Use   Vaping Use: Former  Substance and Sexual Activity   Alcohol use: No    Alcohol/week: 0.0 standard drinks   Drug use: No   Sexual activity: Not on file  Other Topics Concern   Not on file  Social History Narrative   Lives at home with wife and stepson    Right handed   Caffeine: 8 oz coke per day    Social Determinants of Health   Financial Resource  Strain: Not on file  Food Insecurity: Not on file  Transportation Needs: Not on file  Physical Activity: Not on file  Stress: Not on file  Social Connections: Not on file  Intimate Partner Violence: Not on file    Family History  Problem Relation Age of Onset   Breast cancer Mother    Diabetes Mother    Osteoarthritis Mother    Stroke Mother    Bladder Cancer Father    Diabetes Father    Stroke Father    Hypertension Father    Pancreatic cancer Father    Migraines Father    Irritable bowel syndrome Father    Hypertension Brother    Other Brother        stomach ulcer   Colon cancer Neg Hx    Rectal cancer Neg Hx    Esophageal cancer Neg Hx     Past Medical History:  Diagnosis Date   ADHD (attention deficit hyperactivity disorder)    Anemia    Anxiety panic attack   Arthritis    COPD (chronic obstructive pulmonary disease) ([place])    bronchitis/emphysema   Coronary artery disease    Depression    Emphysema lung ([place])    Enlarged prostate    Fibromyalgia    GERD (gastroesophageal reflux disease)    Hyperlipidemia    Hypertension    IBS (irritable bowel syndrome)    Migraine    Panic attack     Patient Active Problem List   Diagnosis Date Noted   Encounter for therapeutic drug monitoring 04/03/2021   Thrombus in heart chamber 04/03/2021   Hyperlipidemia 03/14/2021   Coronary artery disease    Chronic migraine without aura, with intractable migraine, so stated, with status migrainosus 07/14/2020   Severe episode of recurrent major depressive disorder, without psychotic features ([place]) 07/14/2020   Polyclonal gammopathy determined by serum protein electrophoresis 02/25/2018   Primary osteoarthritis of both feet 12/13/2017   History of BPH 12/13/2017   Family history of psoriasis in mother 12/13/2017   History of gastroesophageal reflux (GERD) 12/13/2017   Anxiety and depression 12/13/2017   COPD (chronic obstructive pulmonary disease) ([place]) 01/29/2013   CHEST  PAIN-PRECORDIAL 11/01/2009  Primary osteoarthritis of both hands 10/12/2009   CHEST PAIN 10/12/2009    Past Surgical History:  Procedure Laterality Date   CARDIAC CATHETERIZATION     CORONARY STENT INTERVENTION N/A 03/14/2021   Procedure: CORONARY STENT INTERVENTION;  Surgeon: Jettie Booze, MD;  Location: [facility];  Service: Cardiovascular;  Laterality: N/A;   LEFT HEART CATH AND CORONARY ANGIOGRAPHY N/A 03/14/2021   Procedure: LEFT HEART CATH AND CORONARY ANGIOGRAPHY;  Surgeon: Jettie Booze, MD;  Location: [place] [facility];  Service: Cardiovascular;  Laterality: N/A;   NO PAST SURGERIES      Current Outpatient Medications  Medication Sig Dispense Refill   ALPRAZolam (XANAX) 1 MG tablet Take 1 mg by mouth 4 (four) times daily.      B Complex-Biotin-FA (B-50 COMPLEX PO) Take 1 capsule by mouth daily.     Botulinum Toxin Type A (BOTOX) 200 units SOLR Provider to inject 155 units into the muscles of the head and neck every 3 months. Discard remainder. 1 each 3   BREO ELLIPTA 100-25 MCG/INH AEPB INHALE 1 PUFF BY MOUTH EVERY DAY 60 each 10   Cholecalciferol (VITAMIN D3) 2000 UNITS TABS Take 4,000 Units by mouth daily after breakfast.     clopidogrel (PLAVIX) 75 MG tablet Take 1 tablet (75 mg total) by mouth daily with breakfast. 90 tablet 1   doxazosin (CARDURA) 8 MG tablet Take 8 mg by mouth daily with supper.     escitalopram (LEXAPRO) 20 MG tablet Take 10 mg by mouth in the morning.  2   Ferrous Sulfate (IRON PO) Take 85 mg by mouth daily after breakfast. Hema Plex Iron     gabapentin (NEURONTIN) 400 MG capsule Take 400 mg by mouth 4 (four) times daily.      MAGNESIUM MALATE PO Take 400 mg by mouth daily after breakfast.     memantine (NAMENDA) 10 MG tablet Take 20 mg in the morning and 10 mg in the late afternoon (Patient taking differently: Take 10-20 mg by mouth See admin instructions. Take 20 mg in the morning and 10 mg by mouth with supper) 90 tablet 0    memantine (NAMENDA) 10 MG tablet Take 1 tablet (10 mg total) by mouth 2 (two) times daily. 180 tablet 4   Multiple Vitamin (MULTIVITAMIN WITH MINERALS) TABS tablet Take 1 tablet by mouth daily after breakfast. Centrum Silver for Men     Nerve Stimulator (NERIVIO) DEVI USE WITHIN 1 HR OF MIGRAINE. SET A STRONG YET COMFORTABLE INTENSITY LEVEL & MAINTAIN LEVEL FOR 45 MINS 1 each 12   nicotine (NICODERM CQ - DOSED IN MG/24 HOURS) 14 mg/24hr patch Place 14 mg onto the skin daily.     nitroGLYCERIN (NITROSTAT) 0.4 MG SL tablet Place 1 tablet (0.4 mg total) under the tongue every 5 (five) minutes as needed. 25 tablet 2   Omega-3 Fatty Acids (FISH OIL) 1200 MG CAPS Take 1,200 mg by mouth daily after breakfast.     pantoprazole (PROTONIX) 20 MG tablet Take 1 tablet (20 mg total) by mouth daily. 90 tablet 3   phenylephrine-shark liver oil-mineral oil-petrolatum (PREPARATION H) 0.25-14-74.9 % rectal ointment Place 1 application rectally 2 (two) times daily as needed for hemorrhoids.     rosuvastatin (CRESTOR) 20 MG tablet Take 1 tablet (20 mg total) by mouth daily. 90 tablet 3   silodosin (RAPAFLO) 8 MG CAPS capsule Take 8 mg by mouth daily with breakfast.     vitamin B-12 (CYANOCOBALAMIN) 500 MCG tablet  Take 500 mcg by mouth daily.     warfarin (COUMADIN) 5 MG tablet Take 1/2 to 1 tablet by mouth once daily as directed by Anticoagulation Clinic (Patient taking differently: Take 2.5-5 mg by mouth See admin instructions. Take 1/2 to 1 tablet by mouth once daily as directed by Anticoagulation Clinic; 5 mg Tues Thurs and Sat, and 2.5 mg all other days) 30 tablet 2   Ubrogepant (UBRELVY) 100 MG TABS TAKE 1 TABLET MOUTH AS NEEDED AS CLOSE TO MIGRAINE ONSET AS POSSIBLE. MAY REPEAT IN 2 HOURS IF NEEDED. MAX 200 MG PER 24 HOURS. 16 tablet 11   No current facility-administered medications for this visit.    Allergies as of 06/01/2021 - Review Complete 06/01/2021  Allergen Reaction Noted   Tetracycline Anaphylaxis and  Other (See Comments) 11/30/2015   Prednisone Other (See Comments) 11/15/2017   Sulfamethoxazole-trimethoprim Nausea Only 08/24/2011   Triptans  10/19/2020   Effexor [venlafaxine hcl] Rash 08/24/2011    Vitals: BP 123/70    Pulse 64  Last Weight:  Wt Readings from Last 1 Encounters:  05/19/21 186 lb (84.4 kg)   Last Height:   Ht Readings from Last 1 Encounters:  05/18/21 5\' 6"  (1.676 m)    Exam: NAD, pleasant                  Speech:    Speech is normal; fluent and spontaneous with normal comprehension.  Cognition:    The patient is oriented to person, place, and time;     recent and remote memory intact;     language fluent;    Cranial Nerves:    The pupils are equal, round, and reactive to light.Trigeminal sensation is intact and the muscles of mastication are normal. The face is symmetric. The palate elevates in the midline. Hearing intact. Voice is normal. Shoulder shrug is normal. The tongue has normal motion without fasciculations.   Coordination:  No dysmetria  Motor Observation:    No asymmetry, no atrophy, and no involuntary movements noted. Tone:    Normal muscle tone.     Strength:    Strength is V/V in the upper and lower limbs.      Sensation: intact to LT    Assessment/Plan:  66 year old intractable migrain patient.. He has a long history, been to multiple neurologists, has tried many medications. I had a long discussion with him again, I am not sure that I have anymore answers for him than previous neurologists and I recommend another pair of eyes maybe [facility]. We can continue his botox therapy here and we tried [place] for his intractable migraines and refractory depression. Also tried nerivio. Discussed other options, medical devices (he tried cefaly, he knows about VNS stimulator but financially not feasible). When patient comes I usually allocate extra time for himbecause he talks a lot about his social stressors, stress at home, things that are  going on in his life that may be affecting his migraines, I have tried to express to him that unfortunately I am not skilled as a therapist, of course I want to know what is going on but there comes a point where I may not be able to help him.   - Medications tried in the past include Emgality(worked great then stopped working, went up to 300 didn't help), Ajovy(Didn't work as well as others), Aimovig, Depakote, gabapentin, Topamax, memantine, Effexor, nortriptyline/TCAs, beta-blockers are contraindicated due to COPD, sumatriptan, Nurtec, aspirin, Lexapro, ketorolac injections, Mobic, Reglan injections, rizatriptan,  sumatriptan, zonisamide, vyepti, qulipta, nurtec, ubrelvy and many others over the last 10 years, TMS therapy, Nerivio, cognitive behavioral therapy. Has not tried candesartan but his blood pressure is on the low side and usually this is cost prohibitive or we can't get it approved, been to therapy and psychiatry(not currently however). Has not tried verapamil but again his blood pressure is on the lower side and verapamil now lowered to a class C migraine preventative. He tried Cephaly. VNS was cost prohibitive.  -   STOP vyepti, restart qulipta and ubrelvy, filled out [employer] forms for financial assistance  - Stop Vyepti, Restart Qulipta, Continue Ubrelvy, memantine, nerivio,botox, gabapentin, magnesium, lexapro for migraines  - increase memantine 10mg  bid  - Has also seen Rheumatology and had extensive workup in 2019. Been to ENT 02/2021.Regularly sees cardiology and primary care.Also follows with pulmonology last time 09/28/2020.  - Also had CT head 05/2020, 02/2016, 11/2014. MRI brain 09/2011. No etiology for headaches found  - denies symptoms of OSA   - Been to [facility] neurology Dr. Sima Matas, [facility] neurology Dr. Jaynee Eagles, Dr. Melton Alar headache specialist, [facility]  - Have been up front, I really don't have a lot more ideas for him, he needs an academic center or [facility]  Neuro for his headache that will definitely have more resources and/or clinical trials than we do.  - stop vyepti, filled out forms for [employer] patient assistance for [place] -We are performing botox on him every 12 weeks. -We sent him to [place] therapy.  - We also started nerivio. He tried Cephaly. VNS was cost prohibitive - discussed spravato , would need to see psychiatry for this -. The nerivio helps some. He has been using it when having a new headache and the TMS helped some.  - Dry needling hurts but helps - recommended biofeedback - recommended cognitive behavioral therapy - recommended psychiatry and therapist - Maybe [facility] has clinical trials? - Encouraged healthy lifestyle, exercise  Discussed: To prevent or relieve headaches, try the following: Cool Compress. Lie down and place a cool compress on your head.  Avoid headache triggers. If certain foods or odors seem to have triggered your migraines in the past, avoid them. A headache diary might help you identify triggers.  Include physical activity in your daily routine. Try a daily walk or other moderate aerobic exercise.  Manage stress. Find healthy ways to cope with the stressors, such as delegating tasks on your to-do list.  Practice relaxation techniques. Try deep breathing, yoga, massage and visualization.  Eat regularly. Eating regularly scheduled meals and maintaining a healthy diet might help prevent headaches. Also, drink plenty of fluids.  Follow a regular sleep schedule. Sleep deprivation might contribute to headaches Consider biofeedback. With this mind-body technique, you learn to control certain bodily functions -- such as muscle tension, heart rate and blood pressure -- to prevent headaches or reduce headache pain.    Proceed to emergency room if you experience new or worsening symptoms or symptoms do not resolve, if you have new neurologic symptoms or if headache is severe, or for any  concerning symptom.   Provided education and documentation from American headache [employer] toolbox including articles on: chronic migraine medication overuse headache, chronic migraines, prevention of migraines, behavioral and other nonpharmacologic treatments for headache.  Meds ordered this encounter  Medications   Ubrogepant (UBRELVY) 100 MG TABS    Sig: TAKE 1 TABLET MOUTH AS NEEDED AS CLOSE TO MIGRAINE ONSET AS POSSIBLE. MAY REPEAT IN 2  HOURS IF NEEDED. MAX 200 MG PER 24 HOURS.    Dispense:  16 tablet    Refill:  11    Please put Rx on hold. Pt will look into a savings program.   memantine (NAMENDA) 10 MG tablet    Sig: Take 1 tablet (10 mg total) by mouth 2 (two) times daily.    Dispense:  180 tablet    Refill:  4   Orders Placed This Encounter  Procedures   Ambulatory referral to Neurology     I spent over 90 minutes of face-to-face and non-face-to-face time with patient on the  1. Chronic migraine without aura, with intractable migraine, so stated, with status migrainosus     diagnosis.  This included previsit chart review, lab review, study review, order entry, electronic health record documentation, patient education on the different diagnostic and therapeutic options, counseling and coordination of care, risks and benefits of management, compliance, or risk factor reduction   Cc: Elwyn Reach, MD,   Sarina Ill, MD  [facility] [address] [place], [place] [address]  Phone [phone] Fax [phone]

## 2021-06-01 NOTE — Telephone Encounter (Signed)
-----   Message from Melvenia Beam, MD sent at 06/01/2021  2:37 PM EST ----- Regarding: Stop Vyepti Please stop Vyepti, not helping and too expensive

## 2021-06-01 NOTE — Patient Instructions (Addendum)
Stop Vyepti Restart Qulipta Continue Ubrelvy, memantine, nerivio,botox, gabapentin magnesium

## 2021-06-02 ENCOUNTER — Other Ambulatory Visit: Payer: Self-pay

## 2021-06-02 ENCOUNTER — Encounter: Payer: Self-pay | Admitting: Neurology

## 2021-06-02 ENCOUNTER — Encounter (HOSPITAL_COMMUNITY)
Admission: RE | Admit: 2021-06-02 | Discharge: 2021-06-02 | Disposition: A | Discharge status: Home / Self Care | Payer: Medicare Other | Source: Ambulatory Visit | Attending: Interventional Cardiology | Admitting: Interventional Cardiology

## 2021-06-02 DIAGNOSIS — Z955 Presence of coronary angioplasty implant and graft: Secondary | ICD-10-CM | POA: Diagnosis not present

## 2021-06-05 ENCOUNTER — Encounter (HOSPITAL_COMMUNITY)
Admission: RE | Admit: 2021-06-05 | Discharge: 2021-06-05 | Disposition: A | Discharge status: Home / Self Care | Payer: Medicare Other | Source: Ambulatory Visit | Attending: Interventional Cardiology | Admitting: Interventional Cardiology

## 2021-06-05 ENCOUNTER — Telehealth: Payer: Self-pay | Admitting: *Deleted

## 2021-06-05 ENCOUNTER — Other Ambulatory Visit: Payer: Self-pay

## 2021-06-05 DIAGNOSIS — Z955 Presence of coronary angioplasty implant and graft: Secondary | ICD-10-CM

## 2021-06-05 NOTE — Telephone Encounter (Signed)
Completed Qulipta 60 mg PA on Cover My Meds. Key: [id number]. Awaiting determination from [facility].

## 2021-06-05 NOTE — Telephone Encounter (Signed)
Application for Kyle Walters patient assistance request through [employer] Hurt has been faxed to [facility] along with insurance approval letters for both medications. Received a receipt of confirmation.  Will update when we have heard from [facility]. Pt has been updated as well.

## 2021-06-05 NOTE — Telephone Encounter (Signed)
Patient Name: Kyle Walters  Patient DOB: Aug 28, 1955 Patient ID: [id number]  Status of Request: Approve Medication Name: Lenoria Chime Tab 60mg   GPI/NDC: [id number] Decision Notes: QULIPTA TAB 60MG , use as directed, is approved for non-formulary exception through 04/22/2022 under your Medicare Part D benefit. Reviewed by: System. If the treating physician would like to discuss this coverage decision with the physician or health care professional reviewer, please call [facility] Prior Authorization department at [phone].

## 2021-06-07 ENCOUNTER — Encounter (HOSPITAL_COMMUNITY)
Admission: RE | Admit: 2021-06-07 | Discharge: 2021-06-07 | Disposition: A | Discharge status: Home / Self Care | Payer: Medicare Other | Source: Ambulatory Visit | Attending: Interventional Cardiology | Admitting: Interventional Cardiology

## 2021-06-07 ENCOUNTER — Encounter: Payer: Self-pay | Admitting: Interventional Cardiology

## 2021-06-07 ENCOUNTER — Other Ambulatory Visit: Payer: Self-pay

## 2021-06-07 DIAGNOSIS — Z955 Presence of coronary angioplasty implant and graft: Secondary | ICD-10-CM

## 2021-06-07 MED ORDER — CLOPIDOGREL BISULFATE 75 MG PO TABS: 75.0000 mg | ORAL_TABLET | Freq: Every day | ORAL | 3 refills | Status: DC

## 2021-06-07 NOTE — Progress Notes (Signed)
Cardiac Individual Treatment Plan  Patient Details  Name: Kyle Walters MRN: [id number] Date of Birth: 08-07-1955 Referring Provider:   Flowsheet Row CARDIAC REHAB PHASE II ORIENTATION from 05/18/2021 in [facility]  Referring Provider Dr. Larae Grooms, MD       Initial Encounter Date:  [facility] from 05/18/2021 in [place]  Date 05/18/21       Visit Diagnosis: 03/14/21 S/P DESx2 RCA  Patient's Home Medications on Admission:  Current Outpatient Medications:    ALPRAZolam (XANAX) 1 MG tablet, Take 1 mg by mouth 4 (four) times daily. , Disp: , Rfl:    B Complex-Biotin-FA (B-50 COMPLEX PO), Take 1 capsule by mouth daily., Disp: , Rfl:    Botulinum Toxin Type A (BOTOX) 200 units SOLR, Provider to inject 155 units into the muscles of the head and neck every 3 months. Discard remainder., Disp: 1 each, Rfl: 3   BREO ELLIPTA 100-25 MCG/INH AEPB, INHALE 1 PUFF BY MOUTH EVERY DAY, Disp: 60 each, Rfl: 10   Cholecalciferol (VITAMIN D3) 2000 UNITS TABS, Take 4,000 Units by mouth daily after breakfast., Disp: , Rfl:    clopidogrel (PLAVIX) 75 MG tablet, Take 1 tablet (75 mg total) by mouth daily with breakfast., Disp: 90 tablet, Rfl: 3   doxazosin (CARDURA) 8 MG tablet, Take 8 mg by mouth daily with supper., Disp: , Rfl:    escitalopram (LEXAPRO) 20 MG tablet, Take 10 mg by mouth in the morning., Disp: , Rfl: 2   Ferrous Sulfate (IRON PO), Take 85 mg by mouth daily after breakfast. Hema Plex Iron, Disp: , Rfl:    gabapentin (NEURONTIN) 400 MG capsule, Take 400 mg by mouth 4 (four) times daily. , Disp: , Rfl:    MAGNESIUM MALATE PO, Take 400 mg by mouth daily after breakfast., Disp: , Rfl:    memantine (NAMENDA) 10 MG tablet, Take 20 mg in the morning and 10 mg in the late afternoon (Patient taking differently: Take 10-20 mg by mouth See admin instructions. Take 20 mg in the morning and 10  mg by mouth with supper), Disp: 90 tablet, Rfl: 0   memantine (NAMENDA) 10 MG tablet, Take 1 tablet (10 mg total) by mouth 2 (two) times daily., Disp: 180 tablet, Rfl: 4   Multiple Vitamin (MULTIVITAMIN WITH MINERALS) TABS tablet, Take 1 tablet by mouth daily after breakfast. Centrum Silver for Men, Disp: , Rfl:    Nerve Stimulator (NERIVIO) DEVI, USE WITHIN 1 HR OF MIGRAINE. SET A STRONG YET COMFORTABLE INTENSITY LEVEL & MAINTAIN LEVEL FOR 45 MINS, Disp: 1 each, Rfl: 12   nicotine (NICODERM CQ - DOSED IN MG/24 HOURS) 14 mg/24hr patch, Place 14 mg onto the skin daily., Disp: , Rfl:    nitroGLYCERIN (NITROSTAT) 0.4 MG SL tablet, Place 1 tablet (0.4 mg total) under the tongue every 5 (five) minutes as needed., Disp: 25 tablet, Rfl: 2   Omega-3 Fatty Acids (FISH OIL) 1200 MG CAPS, Take 1,200 mg by mouth daily after breakfast., Disp: , Rfl:    pantoprazole (PROTONIX) 20 MG tablet, Take 1 tablet (20 mg total) by mouth daily., Disp: 90 tablet, Rfl: 3   phenylephrine-shark liver oil-mineral oil-petrolatum (PREPARATION H) 0.25-14-74.9 % rectal ointment, Place 1 application rectally 2 (two) times daily as needed for hemorrhoids., Disp: , Rfl:    rosuvastatin (CRESTOR) 20 MG tablet, Take 1 tablet (20 mg total) by mouth daily., Disp: 90 tablet, Rfl: 3  silodosin (RAPAFLO) 8 MG CAPS capsule, Take 8 mg by mouth daily with breakfast., Disp: , Rfl:    Ubrogepant (UBRELVY) 100 MG TABS, TAKE 1 TABLET MOUTH AS NEEDED AS CLOSE TO MIGRAINE ONSET AS POSSIBLE. MAY REPEAT IN 2 HOURS IF NEEDED. MAX 200 MG PER 24 HOURS., Disp: 16 tablet, Rfl: 11   vitamin B-12 (CYANOCOBALAMIN) 500 MCG tablet, Take 500 mcg by mouth daily., Disp: , Rfl:    warfarin (COUMADIN) 5 MG tablet, Take 1/2 to 1 tablet by mouth once daily as directed by Anticoagulation Clinic (Patient taking differently: Take 2.5-5 mg by mouth See admin instructions. Take 1/2 to 1 tablet by mouth once daily as directed by Anticoagulation Clinic; 5 mg Tues Thurs and Sat,  and 2.5 mg all other days), Disp: 30 tablet, Rfl: 2  Past Medical History: Past Medical History:  Diagnosis Date   ADHD (attention deficit hyperactivity disorder)    Anemia    Anxiety panic attack   Arthritis    COPD (chronic obstructive pulmonary disease) (HCC)    bronchitis/emphysema   Coronary artery disease    Depression    Emphysema lung (HCC)    Enlarged prostate    Fibromyalgia    GERD (gastroesophageal reflux disease)    Hyperlipidemia    Hypertension    IBS (irritable bowel syndrome)    Migraine    Panic attack     Tobacco Use: Social History   Tobacco Use  Smoking Status Former   Packs/day: 1.50   Years: 40.00   Pack years: 60.00   Types: Cigarettes   Quit date: 05/17/2013   Years since quitting: 8.0  Smokeless Tobacco Never  Tobacco Comments   Patient wears a nicotine patch daily     Labs: Recent Review Flowsheet Data     Labs for ITP Cardiac and Pulmonary Rehab Latest Ref Rng & Units 03/30/2009 09/30/2012 05/01/2021   Cholestrol 100 - 199 mg/dL - - 137   LDLCALC 0 - 99 mg/dL - - 74   HDL >39 mg/dL - - 50   Trlycerides 0 - 149 mg/dL - - 62   TCO2 0 - 100 mmol/L 30 28 -       Capillary Blood Glucose: No results found for: GLUCAP   Exercise Target Goals: Exercise Program Goal: Individual exercise prescription set using results from initial 6 min walk test and THRR while considering  patients activity barriers and safety.   Exercise Prescription Goal: Starting with aerobic activity 30 plus minutes a day, 3 days per week for initial exercise prescription. Provide home exercise prescription and guidelines that participant acknowledges understanding prior to discharge.  Activity Barriers & Risk Stratification:  Activity Barriers & Cardiac Risk Stratification - 05/18/21 1516       Activity Barriers & Cardiac Risk Stratification   Activity Barriers Arthritis;Neck/Spine Problems;Chest Pain/Angina;Back Problems;Balance Concerns;Deconditioning;Joint  Problems    Cardiac Risk Stratification High             6 Minute Walk:  6 Minute Walk     Row Name 05/18/21 1507         6 Minute Walk   Phase Initial     Distance 1200 feet     Walk Time 6 minutes     # of Rest Breaks 0     MPH 2.27     METS 2.65     RPE 12     Perceived Dyspnea  0     VO2 Peak 9.28     Symptoms  Yes (comment)     Comments Migraine Triggered by sani wipe. Manageable with rest     Resting HR 63 bpm     Resting BP 114/70     Resting Oxygen Saturation  94 %     Exercise Oxygen Saturation  during 6 min walk 95 %     Max Ex. HR 87 bpm     Max Ex. BP 132/62     2 Minute Post BP 118/62              Oxygen Initial Assessment:   Oxygen Re-Evaluation:   Oxygen Discharge (Final Oxygen Re-Evaluation):   Initial Exercise Prescription:  Initial Exercise Prescription - 05/18/21 1500       Date of Initial Exercise RX and Referring Provider   Date 05/18/21    Referring Provider Dr. Larae Grooms, MD    Expected Discharge Date 07/14/21      NuStep   Level 1    SPM 80    Minutes 30    METs 1.9      Prescription Details   Frequency (times per week) 3    Duration Progress to 30 minutes of continuous aerobic without signs/symptoms of physical distress      Intensity   THRR 40-80% of Max Heartrate 62-123    Ratings of Perceived Exertion 11-13    Perceived Dyspnea 0-4      Progression   Progression Continue progressive overload as per policy without signs/symptoms or physical distress.      Resistance Training   Training Prescription Yes    Weight 3    Reps 10-15             Perform Capillary Blood Glucose checks as needed.  Exercise Prescription Changes:   Exercise Prescription Changes     Row Name 05/24/21 1642             Response to Exercise   Blood Pressure (Admit) 122/70       Blood Pressure (Exercise) 128/70       Blood Pressure (Exit) 102/66       Heart Rate (Admit) 63 bpm       Heart Rate (Exercise) 75  bpm       Heart Rate (Exit) 58 bpm       Rating of Perceived Exertion (Exercise) 11       Perceived Dyspnea (Exercise) 0       Symptoms c/o nausea when finished exercise. Vitals stable, resolved with rest       Comments Pt first day of exercise       Duration Progress to 30 minutes of  aerobic without signs/symptoms of physical distress       Intensity THRR unchanged         Progression   Progression Continue to progress workloads to maintain intensity without signs/symptoms of physical distress.       Average METs 1.7         Resistance Training   Training Prescription No         NuStep   Level 1       SPM 80       Minutes 30       METs 1.7                Exercise Comments:   Exercise Comments     Row Name 05/24/21 1646           Exercise Comments Pt first day in the CRP2  program. Pt tolerated low intensity exercise well with an average MET level of 1,7. Pt is learning his THRR, RPE and ExRx and is off to a good start                Exercise Goals and Review:   Exercise Goals     Row Name 05/18/21 1520             Exercise Goals   Increase Physical Activity Yes       Intervention Provide advice, education, support and counseling about physical activity/exercise needs.;Develop an individualized exercise prescription for aerobic and resistive training based on initial evaluation findings, risk stratification, comorbidities and participant's personal goals.       Expected Outcomes Short Term: Attend rehab on a regular basis to increase amount of physical activity.;Long Term: Add in home exercise to make exercise part of routine and to increase amount of physical activity.;Long Term: Exercising regularly at least 3-5 days a week.       Increase Strength and Stamina Yes       Intervention Provide advice, education, support and counseling about physical activity/exercise needs.;Develop an individualized exercise prescription for aerobic and resistive training  based on initial evaluation findings, risk stratification, comorbidities and participant's personal goals.       Expected Outcomes Short Term: Increase workloads from initial exercise prescription for resistance, speed, and METs.;Short Term: Perform resistance training exercises routinely during rehab and add in resistance training at home;Long Term: Improve cardiorespiratory fitness, muscular endurance and strength as measured by increased METs and functional capacity (6MWT)       Able to understand and use rate of perceived exertion (RPE) scale Yes       Intervention Provide education and explanation on how to use RPE scale       Expected Outcomes Short Term: Able to use RPE daily in rehab to express subjective intensity level;Long Term:  Able to use RPE to guide intensity level when exercising independently       Knowledge and understanding of Target Heart Rate Range (THRR) Yes       Intervention Provide education and explanation of THRR including how the numbers were predicted and where they are located for reference       Expected Outcomes Short Term: Able to state/look up THRR;Long Term: Able to use THRR to govern intensity when exercising independently;Short Term: Able to use daily as guideline for intensity in rehab       Understanding of Exercise Prescription Yes       Intervention Provide education, explanation, and written materials on patient's individual exercise prescription       Expected Outcomes Short Term: Able to explain program exercise prescription;Long Term: Able to explain home exercise prescription to exercise independently                Exercise Goals Re-Evaluation :  Exercise Goals Re-Evaluation     Row Name 05/24/21 1644             Exercise Goal Re-Evaluation   Exercise Goals Review Increase Physical Activity;Increase Strength and Stamina;Able to understand and use rate of perceived exertion (RPE) scale;Knowledge and understanding of Target Heart Rate Range  (THRR);Understanding of Exercise Prescription       Comments Pt first day in the CRP2 program. Pt tolerated low intensity exercise well with an average MET level of 1,7. Pt is learning his THRR, RPE and ExRx and is off to a good start  Expected Outcomes Will continue to monitor pt and progress workloads as tolerated without sign or symptom                 Discharge Exercise Prescription (Final Exercise Prescription Changes):  Exercise Prescription Changes - 05/24/21 1642       Response to Exercise   Blood Pressure (Admit) 122/70    Blood Pressure (Exercise) 128/70    Blood Pressure (Exit) 102/66    Heart Rate (Admit) 63 bpm    Heart Rate (Exercise) 75 bpm    Heart Rate (Exit) 58 bpm    Rating of Perceived Exertion (Exercise) 11    Perceived Dyspnea (Exercise) 0    Symptoms c/o nausea when finished exercise. Vitals stable, resolved with rest    Comments Pt first day of exercise    Duration Progress to 30 minutes of  aerobic without signs/symptoms of physical distress    Intensity THRR unchanged      Progression   Progression Continue to progress workloads to maintain intensity without signs/symptoms of physical distress.    Average METs 1.7      Resistance Training   Training Prescription No      NuStep   Level 1    SPM 80    Minutes 30    METs 1.7             Nutrition:  Target Goals: Understanding of nutrition guidelines, daily intake of sodium '1500mg'$ , cholesterol '200mg'$ , calories 30% from fat and 7% or less from saturated fats, daily to have 5 or more servings of fruits and vegetables.  Biometrics:  Pre Biometrics - 05/18/21 1521       Pre Biometrics   Height $Remov'5\' 6"'mJspGj$  (1.676 m)    Weight 85 kg    Waist Circumference 42.5 inches    Hip Circumference 41.5 inches    Waist to Hip Ratio 1.02 %    BMI (Calculated) 30.26    Triceps Skinfold 20 mm    % Body Fat 31.1 %    Grip Strength 38 kg    Flexibility 11.5 in    Single Leg Stand 15.8 seconds               Nutrition Therapy Plan and Nutrition Goals:   Nutrition Assessments:  MEDIFICTS Score Key: ?70 Need to make dietary changes  40-70 Heart Healthy Diet ? 40 Therapeutic Level Cholesterol Diet   Picture Your Plate Scores: <42 Unhealthy dietary pattern with much room for improvement. 41-50 Dietary pattern unlikely to meet recommendations for good health and room for improvement. 51-60 More healthful dietary pattern, with some room for improvement.  >60 Healthy dietary pattern, although there may be some specific behaviors that could be improved.    Nutrition Goals Re-Evaluation:   Nutrition Goals Discharge (Final Nutrition Goals Re-Evaluation):   Psychosocial: Target Goals: Acknowledge presence or absence of significant depression and/or stress, maximize coping skills, provide positive support system. Participant is able to verbalize types and ability to use techniques and skills needed for reducing stress and depression.  Initial Review & Psychosocial Screening:  Initial Psych Review & Screening - 05/18/21 1700       Initial Review   Current issues with Current Depression;History of Depression;Current Psychotropic Meds;Current Anxiety/Panic;Current Stress Concerns    Source of Stress Concerns Family;Chronic Illness;Unable to participate in former interests or hobbies;Unable to perform yard/household activities;Poor Coping Skills    Comments Currently depressed has PTSD, anxiety, panic attacks      Family Dynamics  Good Support System? Yes   Lives with wife   Concerns Inappropriate over/under dependence on family/friends    Comments Patient is under the care of a neurologist and would benefit from couselling      Barriers   Psychosocial barriers to participate in program The patient should benefit from training in stress management and relaxation.;Psychosocial barriers identified (see note)      Screening Interventions   Interventions Provide feedback about the  scores to participant;To provide support and resources with identified psychosocial needs;Encouraged to exercise    Expected Outcomes Short Term goal: Utilizing psychosocial counselor, staff and physician to assist with identification of specific Stressors or current issues interfering with healing process. Setting desired goal for each stressor or current issue identified.;Long Term Goal: Stressors or current issues are controlled or eliminated.;Short Term goal: Identification and review with participant of any Quality of Life or Depression concerns found by scoring the questionnaire.;Long Term goal: The participant improves quality of Life and PHQ9 Scores as seen by post scores and/or verbalization of changes             Quality of Life Scores:  Quality of Life - 05/18/21 1555       Quality of Life   Select Quality of Life      Quality of Life Scores   Health/Function Pre 9.79 %    Socioeconomic Pre 10.86 %    Psych/Spiritual Pre 12 %    Family Pre 12 %    GLOBAL Pre 10.82 %            Scores of 19 and below usually indicate a poorer quality of life in these areas.  A difference of  2-3 points is a clinically meaningful difference.  A difference of 2-3 points in the total score of the Quality of Life Index has been associated with significant improvement in overall quality of life, self-image, physical symptoms, and general health in studies assessing change in quality of life.  PHQ-9: Recent Review Flowsheet Data     Depression screen [facility] 2/9 05/24/2021   Decreased Interest 0   Down, Depressed, Hopeless 2   PHQ - 2 Score 2   Altered sleeping 3   Tired, decreased energy 1   Change in appetite 1   Feeling bad or failure about yourself  3   Trouble concentrating 2   Moving slowly or fidgety/restless 0   Suicidal thoughts 0   PHQ-9 Score 12   Difficult doing work/chores Somewhat difficult      Interpretation of Total Score  Total Score Depression Severity:  1-4 =  Minimal depression, 5-9 = Mild depression, 10-14 = Moderate depression, 15-19 = Moderately severe depression, 20-27 = Severe depression   Psychosocial Evaluation and Intervention:   Psychosocial Re-Evaluation:  Psychosocial Re-Evaluation     Row Name 05/25/21 0846 06/07/21 1706           Psychosocial Re-Evaluation   Current issues with Current Stress Concerns;Current Depression;Current Psychotropic Meds;History of Depression;Current Anxiety/Panic;Current Sleep Concerns Current Stress Concerns;Current Depression;Current Psychotropic Meds;History of Depression;Current Anxiety/Panic;Current Sleep Concerns      Comments [name] is currently depressed has anxiety and PTSD. Will review qualitly of life in the upcoming sessions. [name] is currently depressed has anxiety and PTSD. Quality of life reviewed. [name] does not want his questionnaire forwarded at this time but will let me know if okay to forward in the future. [name] says he feel a little less depressed      Expected Outcomes [name]'s  anxiety depression and PTSD will be decreased upon completion of phase 2 cardiac rehab [name]'s anxiety depression and PTSD will be decreased upon completion of phase 2 cardiac rehab      Interventions Relaxation education;Stress management education;Encouraged to attend Cardiac Rehabilitation for the exercise Relaxation education;Stress management education;Encouraged to attend Cardiac Rehabilitation for the exercise      Continue Psychosocial Services  -- Follow up required by staff        Initial Review   Source of Stress Concerns Chronic Illness;Poor Coping Skills;Unable to perform yard/household activities;Family;Unable to participate in former interests or hobbies Chronic Illness;Poor Coping Skills;Unable to perform yard/household activities;Family;Unable to participate in former interests or hobbies      Comments Will continue to monitor and offer support as needed Will continue to monitor and offer support as needed                Psychosocial Discharge (Final Psychosocial Re-Evaluation):  Psychosocial Re-Evaluation - 06/07/21 1706       Psychosocial Re-Evaluation   Current issues with Current Stress Concerns;Current Depression;Current Psychotropic Meds;History of Depression;Current Anxiety/Panic;Current Sleep Concerns    Comments [name] is currently depressed has anxiety and PTSD. Quality of life reviewed. [name] does not want his questionnaire forwarded at this time but will let me know if okay to forward in the future. [name] says he feel a little less depressed    Expected Outcomes [name]'s anxiety depression and PTSD will be decreased upon completion of phase 2 cardiac rehab    Interventions Relaxation education;Stress management education;Encouraged to attend Cardiac Rehabilitation for the exercise    Continue Psychosocial Services  Follow up required by staff      Initial Review   Source of Stress Concerns Chronic Illness;Poor Coping Skills;Unable to perform yard/household activities;Family;Unable to participate in former interests or hobbies    Comments Will continue to monitor and offer support as needed             Vocational Rehabilitation: Provide vocational rehab assistance to qualifying candidates.   Vocational Rehab Evaluation & Intervention:  Vocational Rehab - 05/18/21 1705       Initial Vocational Rehab Evaluation & Intervention   Assessment shows need for Vocational Rehabilitation No   [name] is disabled and does not need vocational rehab at this time.            Education: Education Goals: Education classes will be provided on a weekly basis, covering required topics. Participant will state understanding/return demonstration of topics presented.  Learning Barriers/Preferences:  Learning Barriers/Preferences - 05/18/21 1523       Learning Barriers/Preferences   Learning Barriers Sight;Reading;Exercise Concerns   Pt has migraine triggers that can cause dizzy spells, memory  loss and occasionally sight issues   Learning Preferences Individual Instruction;Pictoral;Skilled Demonstration             Education Topics: Hypertension, Hypertension Reduction -Define heart disease and high blood pressure. Discus how high blood pressure affects the body and ways to reduce high blood pressure.   Exercise and Your Heart -Discuss why it is important to exercise, the FITT principles of exercise, normal and abnormal responses to exercise, and how to exercise safely.   Angina -Discuss definition of angina, causes of angina, treatment of angina, and how to decrease risk of having angina.   Cardiac Medications -Review what the following cardiac medications are used for, how they affect the body, and side effects that may occur when taking the medications.  Medications include Aspirin, Beta blockers, calcium  channel blockers, ACE Inhibitors, angiotensin receptor blockers, diuretics, digoxin, and antihyperlipidemics.   Congestive Heart Failure -Discuss the definition of CHF, how to live with CHF, the signs and symptoms of CHF, and how keep track of weight and sodium intake.   Heart Disease and Intimacy -Discus the effect sexual activity has on the heart, how changes occur during intimacy as we age, and safety during sexual activity.   Smoking Cessation / COPD -Discuss different methods to quit smoking, the health benefits of quitting smoking, and the definition of COPD.   Nutrition I: Fats -Discuss the types of cholesterol, what cholesterol does to the heart, and how cholesterol levels can be controlled.   Nutrition II: Labels -Discuss the different components of food labels and how to read food label   Heart Parts/Heart Disease and PAD -Discuss the anatomy of the heart, the pathway of blood circulation through the heart, and these are affected by heart disease.   Stress I: Signs and Symptoms -Discuss the causes of stress, how stress may lead to anxiety  and depression, and ways to limit stress.   Stress II: Relaxation -Discuss different types of relaxation techniques to limit stress.   Warning Signs of Stroke / TIA -Discuss definition of a stroke, what the signs and symptoms are of a stroke, and how to identify when someone is having stroke.   Knowledge Questionnaire Score:  Knowledge Questionnaire Score - 05/19/21 1006       Knowledge Questionnaire Score   Pre Score 23/24             Core Components/Risk Factors/Patient Goals at Admission:  Personal Goals and Risk Factors at Admission - 05/18/21 1527       Core Components/Risk Factors/Patient Goals on Admission    Weight Management Yes    Intervention Weight Management: Develop a combined nutrition and exercise program designed to reach desired caloric intake, while maintaining appropriate intake of nutrient and fiber, sodium and fats, and appropriate energy expenditure required for the weight goal.;Weight Management: Provide education and appropriate resources to help participant work on and attain dietary goals.;Weight Management/Obesity: Establish reasonable short term and long term weight goals.;Obesity: Provide education and appropriate resources to help participant work on and attain dietary goals.    Admit Weight 187 lb 6.3 oz (85 kg)    Expected Outcomes Short Term: Continue to assess and modify interventions until short term weight is achieved;Long Term: Adherence to nutrition and physical activity/exercise program aimed toward attainment of established weight goal;Weight Loss: Understanding of general recommendations for a balanced deficit meal plan, which promotes 1-2 lb weight loss per week and includes a negative energy balance of [phone] kcal/d;Understanding recommendations for meals to include 15-35% energy as protein, 25-35% energy from fat, 35-60% energy from carbohydrates, less than $RemoveB'200mg'nQbwUsIr$  of dietary cholesterol, 20-35 gm of total fiber daily;Understanding of  distribution of calorie intake throughout the day with the consumption of 4-5 meals/snacks    Hypertension Yes    Intervention Provide education on lifestyle modifcations including regular physical activity/exercise, weight management, moderate sodium restriction and increased consumption of fresh fruit, vegetables, and low fat dairy, alcohol moderation, and smoking cessation.;Monitor prescription use compliance.    Expected Outcomes Short Term: Continued assessment and intervention until BP is < 140/50mm HG in hypertensive participants. < 130/6mm HG in hypertensive participants with diabetes, heart failure or chronic kidney disease.;Long Term: Maintenance of blood pressure at goal levels.    Lipids Yes    Intervention Provide education and support for participant on  nutrition & aerobic/resistive exercise along with prescribed medications to achieve LDL '70mg'$ , HDL >$Remo'40mg'nQPua$ .    Expected Outcomes Short Term: Participant states understanding of desired cholesterol values and is compliant with medications prescribed. Participant is following exercise prescription and nutrition guidelines.;Long Term: Cholesterol controlled with medications as prescribed, with individualized exercise RX and with personalized nutrition plan. Value goals: LDL < $Rem'70mg'LFWX$ , HDL > 40 mg.    Stress Yes    Intervention Offer individual and/or small group education and counseling on adjustment to heart disease, stress management and health-related lifestyle change. Teach and support self-help strategies.;Refer participants experiencing significant psychosocial distress to appropriate mental health specialists for further evaluation and treatment. When possible, include family members and significant others in education/counseling sessions.    Expected Outcomes Short Term: Participant demonstrates changes in health-related behavior, relaxation and other stress management skills, ability to obtain effective social support, and compliance with  psychotropic medications if prescribed.;Long Term: Emotional wellbeing is indicated by absence of clinically significant psychosocial distress or social isolation.    Personal Goal Other Yes    Personal Goal Long and short the same: lose wt and strengthen heart muscle    Intervention Will continue to monitor pt and progress workloads as tolerated without sign or symptom    Expected Outcomes Pt will achieve his goals             Core Components/Risk Factors/Patient Goals Review:   Goals and Risk Factor Review     Row Name 05/25/21 0911             Core Components/Risk Factors/Patient Goals Review   Personal Goals Review Weight Management/Obesity;Hypertension;Lipids;Stress       Review [name] started exercise at cardiac rehab on 05/24/21. [name] did well with exercise. VSS no ectopy noted       Expected Outcomes [name] will continue to participate on phase 2 cardiac rehab for exercise, nutrition and life style modifications                Core Components/Risk Factors/Patient Goals at Discharge (Final Review):   Goals and Risk Factor Review - 05/25/21 0911       Core Components/Risk Factors/Patient Goals Review   Personal Goals Review Weight Management/Obesity;Hypertension;Lipids;Stress    Review [name] started exercise at cardiac rehab on 05/24/21. [name] did well with exercise. VSS no ectopy noted    Expected Outcomes [name] will continue to participate on phase 2 cardiac rehab for exercise, nutrition and life style modifications             ITP Comments:  ITP Comments     Row Name 05/18/21 1645 05/25/21 0919 06/07/21 1703       ITP Comments Dr Fransico Him MD, Medical Director 30 Day ITP Review. [name] started cardiac rehab on 05/24/21. [name] did well with exercise. VSS 30 Day ITP Review. [name] has good participation in phase 2 cardiac rehab and has not had any further active migraines since orientation              Comments: See ITP comments.Harrell Gave RN BSN

## 2021-06-09 ENCOUNTER — Other Ambulatory Visit: Payer: Self-pay | Admitting: Cardiology

## 2021-06-09 ENCOUNTER — Other Ambulatory Visit: Payer: Self-pay

## 2021-06-09 ENCOUNTER — Encounter (HOSPITAL_COMMUNITY)
Admission: RE | Admit: 2021-06-09 | Discharge: 2021-06-09 | Disposition: A | Discharge status: Home / Self Care | Payer: Medicare Other | Source: Ambulatory Visit | Attending: Interventional Cardiology | Admitting: Interventional Cardiology

## 2021-06-09 DIAGNOSIS — Z955 Presence of coronary angioplasty implant and graft: Secondary | ICD-10-CM

## 2021-06-12 ENCOUNTER — Other Ambulatory Visit: Payer: Self-pay

## 2021-06-12 ENCOUNTER — Encounter (HOSPITAL_COMMUNITY)
Admission: RE | Admit: 2021-06-12 | Discharge: 2021-06-12 | Disposition: A | Discharge status: Home / Self Care | Payer: Medicare Other | Source: Ambulatory Visit | Attending: Interventional Cardiology | Admitting: Interventional Cardiology

## 2021-06-12 DIAGNOSIS — Z955 Presence of coronary angioplasty implant and graft: Secondary | ICD-10-CM | POA: Diagnosis not present

## 2021-06-14 ENCOUNTER — Telehealth (HOSPITAL_COMMUNITY): Payer: Self-pay | Admitting: Internal Medicine

## 2021-06-14 ENCOUNTER — Encounter (HOSPITAL_COMMUNITY): Payer: Medicare Other

## 2021-06-16 ENCOUNTER — Encounter (HOSPITAL_COMMUNITY)
Admission: RE | Admit: 2021-06-16 | Discharge: 2021-06-16 | Disposition: A | Discharge status: Home / Self Care | Payer: Medicare Other | Source: Ambulatory Visit | Attending: Interventional Cardiology | Admitting: Interventional Cardiology

## 2021-06-16 ENCOUNTER — Other Ambulatory Visit: Payer: Self-pay

## 2021-06-16 DIAGNOSIS — Z955 Presence of coronary angioplasty implant and graft: Secondary | ICD-10-CM

## 2021-06-16 NOTE — Progress Notes (Signed)
CARDIAC REHAB PHASE 2  Reviewed home exercise with pt today. Pt is tolerating exercise well. Pt will continue to exercise on their own by using the stationary bike for 30 minutes per session 1-2 days a week (as tolerated with migraine triggers) in addition to the 3 days in CRP2. Advised pt on THRR, RPE scale, hydration and temperature/humidity precautions. Reinforced NTG use, S/S to stop exercise and when to call MD vs 911. Encouraged warm up cool down and stretches with exercise sessions. Pt verbalized understanding, all questions were answered and pt was given a copy to take home.    Kirby Funk ACSM-CEP 06/16/2021 4:47 PM

## 2021-06-19 ENCOUNTER — Encounter (HOSPITAL_COMMUNITY)
Admission: RE | Admit: 2021-06-19 | Discharge: 2021-06-19 | Disposition: A | Discharge status: Home / Self Care | Payer: Medicare Other | Source: Ambulatory Visit | Attending: Interventional Cardiology | Admitting: Interventional Cardiology

## 2021-06-19 ENCOUNTER — Other Ambulatory Visit: Payer: Self-pay

## 2021-06-19 DIAGNOSIS — Z955 Presence of coronary angioplasty implant and graft: Secondary | ICD-10-CM

## 2021-06-20 ENCOUNTER — Ambulatory Visit: Payer: Medicare Other | Admitting: Physician Assistant

## 2021-06-21 ENCOUNTER — Encounter (HOSPITAL_COMMUNITY): Payer: Medicare Other

## 2021-06-21 ENCOUNTER — Telehealth (HOSPITAL_COMMUNITY): Payer: Self-pay | Admitting: Internal Medicine

## 2021-06-22 ENCOUNTER — Telehealth: Payer: Self-pay | Admitting: Adult Health

## 2021-06-22 MED ORDER — BOTOX 200 UNITS IJ SOLR: INTRAMUSCULAR | 3 refills | Status: DC

## 2021-06-22 NOTE — Telephone Encounter (Signed)
Please send Botox Rx to [facility]. ?

## 2021-06-22 NOTE — Telephone Encounter (Signed)
Done

## 2021-06-23 ENCOUNTER — Encounter (HOSPITAL_COMMUNITY)
Admission: RE | Admit: 2021-06-23 | Discharge: 2021-06-23 | Disposition: A | Discharge status: Home / Self Care | Payer: Medicare Other | Source: Ambulatory Visit | Attending: Interventional Cardiology | Admitting: Interventional Cardiology

## 2021-06-23 ENCOUNTER — Other Ambulatory Visit: Payer: Self-pay

## 2021-06-23 DIAGNOSIS — Z955 Presence of coronary angioplasty implant and graft: Secondary | ICD-10-CM | POA: Diagnosis not present

## 2021-06-26 ENCOUNTER — Encounter (HOSPITAL_COMMUNITY)
Admission: RE | Admit: 2021-06-26 | Discharge: 2021-06-26 | Disposition: A | Discharge status: Home / Self Care | Payer: Medicare Other | Source: Ambulatory Visit | Attending: Interventional Cardiology | Admitting: Interventional Cardiology

## 2021-06-26 ENCOUNTER — Ambulatory Visit: Payer: Medicare Other | Admitting: *Deleted

## 2021-06-26 ENCOUNTER — Other Ambulatory Visit: Payer: Self-pay

## 2021-06-26 DIAGNOSIS — I513 Intracardiac thrombosis, not elsewhere classified: Secondary | ICD-10-CM

## 2021-06-26 DIAGNOSIS — Z955 Presence of coronary angioplasty implant and graft: Secondary | ICD-10-CM

## 2021-06-26 DIAGNOSIS — Z5181 Encounter for therapeutic drug level monitoring: Secondary | ICD-10-CM | POA: Diagnosis not present

## 2021-06-26 LAB — POCT INR: INR: 2.9 (ref 2.0–3.0)

## 2021-06-26 NOTE — Patient Instructions (Signed)
Description   ?DO NOT HIGHLIGHT PAPERS & GIVE PRINTED PAPERWORK TO WIFE-THESE THINGS TRIGGER MIGRAINES. Continue on same dosage 1/2 tablet daily except 1 tablet on Tuesdays, Thursdays and Saturdays. Continue eating leafy veggies daily as normal. Recheck INR in 5 weeks. Anticoagulation Clinic [phone] Main [phone] ?  ? ? ?

## 2021-06-28 ENCOUNTER — Other Ambulatory Visit: Payer: Self-pay

## 2021-06-28 ENCOUNTER — Encounter (HOSPITAL_COMMUNITY)
Admission: RE | Admit: 2021-06-28 | Discharge: 2021-06-28 | Disposition: A | Discharge status: Home / Self Care | Payer: Medicare Other | Source: Ambulatory Visit | Attending: Interventional Cardiology | Admitting: Interventional Cardiology

## 2021-06-28 DIAGNOSIS — Z955 Presence of coronary angioplasty implant and graft: Secondary | ICD-10-CM

## 2021-06-29 MED ORDER — BOTOX 200 UNITS IJ SOLR: INTRAMUSCULAR | 3 refills | Status: DC

## 2021-06-29 NOTE — Telephone Encounter (Signed)
Botox approved through covermymeds. [place] # [id number] (06/27/2021-09/27/2021). ?

## 2021-06-29 NOTE — Telephone Encounter (Signed)
You might have to resend the B[facility] rx [facility] since I had to get a new approval from [employer]mymeds. ?

## 2021-06-29 NOTE — Addendum Note (Signed)
Addended by: Gildardo Griffes on: 06/29/2021 10:08 AM ? ? Modules accepted: Orders ? ?

## 2021-06-30 ENCOUNTER — Encounter (HOSPITAL_COMMUNITY): Payer: Medicare Other

## 2021-06-30 ENCOUNTER — Telehealth (HOSPITAL_COMMUNITY): Payer: Self-pay | Admitting: Internal Medicine

## 2021-07-03 ENCOUNTER — Other Ambulatory Visit: Payer: Self-pay

## 2021-07-03 ENCOUNTER — Encounter (HOSPITAL_COMMUNITY)
Admission: RE | Admit: 2021-07-03 | Discharge: 2021-07-03 | Disposition: A | Discharge status: Home / Self Care | Payer: Medicare Other | Source: Ambulatory Visit | Attending: Interventional Cardiology | Admitting: Interventional Cardiology

## 2021-07-03 DIAGNOSIS — Z955 Presence of coronary angioplasty implant and graft: Secondary | ICD-10-CM

## 2021-07-04 NOTE — Progress Notes (Signed)
Cardiac Individual Treatment Plan ? ?Patient Details  ?Name: Kyle Walters ?MRN: [id number] ?Date of Birth: [birth year] ?Referring Provider:   ?Flowsheet Row CARDIAC REHAB PHASE II ORIENTATION from 05/18/2021 in [place]  ?Referring Provider Dr. Larae Grooms, MD  ? ?  ? ? ?Initial Encounter Date:  ?Flowsheet Row CARDIAC REHAB PHASE II ORIENTATION from 05/18/2021 in [facility]  ?Date 05/18/21  ? ?  ? ? ?Visit Diagnosis: 03/14/21 S/P DESx2 RCA ? ?Patient's Home Medications on Admission: ? ?Current Outpatient Medications:  ?  ALPRAZolam (XANAX) 1 MG tablet, Take 1 mg by mouth 4 (four) times daily. , Disp: , Rfl:  ?  B Complex-Biotin-FA (B-50 COMPLEX PO), Take 1 capsule by mouth daily., Disp: , Rfl:  ?  Botulinum Toxin Type A (BOTOX) 200 units SOLR, Provider to inject 155 units into the muscles of the head and neck every 3 months. Discard remainder., Disp: 1 each, Rfl: 3 ?  BREO ELLIPTA 100-25 MCG/INH AEPB, INHALE 1 PUFF BY MOUTH EVERY DAY, Disp: 60 each, Rfl: 10 ?  Cholecalciferol (VITAMIN D3) 2000 UNITS TABS, Take 4,000 Units by mouth daily after breakfast., Disp: , Rfl:  ?  clopidogrel (PLAVIX) 75 MG tablet, Take 1 tablet (75 mg total) by mouth daily with breakfast., Disp: 90 tablet, Rfl: 3 ?  doxazosin (CARDURA) 8 MG tablet, Take 8 mg by mouth daily with supper., Disp: , Rfl:  ?  escitalopram (LEXAPRO) 20 MG tablet, Take 10 mg by mouth in the morning., Disp: , Rfl: 2 ?  Ferrous Sulfate (IRON PO), Take 85 mg by mouth daily after breakfast. Hema Plex Iron, Disp: , Rfl:  ?  gabapentin (NEURONTIN) 400 MG capsule, Take 400 mg by mouth 4 (four) times daily. , Disp: , Rfl:  ?  MAGNESIUM MALATE PO, Take 400 mg by mouth daily after breakfast., Disp: , Rfl:  ?  memantine (NAMENDA) 10 MG tablet, Take 20 mg in the morning and 10 mg in the late afternoon (Patient taking differently: Take 10-20 mg by mouth See admin instructions. Take 20 mg in the morning and 10  mg by mouth with supper), Disp: 90 tablet, Rfl: 0 ?  memantine (NAMENDA) 10 MG tablet, Take 1 tablet (10 mg total) by mouth 2 (two) times daily., Disp: 180 tablet, Rfl: 4 ?  Multiple Vitamin (MULTIVITAMIN WITH MINERALS) TABS tablet, Take 1 tablet by mouth daily after breakfast. Centrum Silver for Men, Disp: , Rfl:  ?  Nerve Stimulator (NERIVIO) DEVI, USE WITHIN 1 HR OF MIGRAINE. SET A STRONG YET COMFORTABLE INTENSITY LEVEL & MAINTAIN LEVEL FOR 45 MINS, Disp: 1 each, Rfl: 12 ?  nicotine (NICODERM CQ - DOSED IN MG/24 HOURS) 14 mg/24hr patch, Place 14 mg onto the skin daily., Disp: , Rfl:  ?  nitroGLYCERIN (NITROSTAT) 0.4 MG SL tablet, Place 1 tablet (0.4 mg total) under the tongue every 5 (five) minutes as needed., Disp: 25 tablet, Rfl: 2 ?  Omega-3 Fatty Acids (FISH OIL) 1200 MG CAPS, Take 1,200 mg by mouth daily after breakfast., Disp: , Rfl:  ?  pantoprazole (PROTONIX) 20 MG tablet, Take 1 tablet (20 mg total) by mouth daily., Disp: 90 tablet, Rfl: 3 ?  phenylephrine-shark liver oil-mineral oil-petrolatum (PREPARATION H) 0.25-14-74.9 % rectal ointment, Place 1 application rectally 2 (two) times daily as needed for hemorrhoids., Disp: , Rfl:  ?  rosuvastatin (CRESTOR) 20 MG tablet, Take 1 tablet (20 mg total) by mouth daily., Disp: 90 tablet, Rfl: 3 ?  silodosin (RAPAFLO) 8 MG CAPS capsule, Take 8 mg by mouth daily with breakfast., Disp: , Rfl:  ?  Ubrogepant (UBRELVY) 100 MG TABS, TAKE 1 TABLET MOUTH AS NEEDED AS CLOSE TO MIGRAINE ONSET AS POSSIBLE. MAY REPEAT IN 2 HOURS IF NEEDED. MAX 200 MG PER 24 HOURS., Disp: 16 tablet, Rfl: 11 ?  vitamin B-12 (CYANOCOBALAMIN) 500 MCG tablet, Take 500 mcg by mouth daily., Disp: , Rfl:  ?  warfarin (COUMADIN) 5 MG tablet, Take 1/2 to 1 tablet by mouth once daily as directed by Anticoagulation Clinic (Patient taking differently: Take 2.5-5 mg by mouth See admin instructions. Take 1/2 to 1 tablet by mouth once daily as directed by Anticoagulation Clinic; 5 mg Tues Thurs and Sat,  and 2.5 mg all other days), Disp: 30 tablet, Rfl: 2 ? ?Past Medical History: ?Past Medical History:  ?Diagnosis Date  ? ADHD (attention deficit hyperactivity disorder)   ? Anemia   ? Anxiety panic attack  ? Arthritis   ? COPD (chronic obstructive pulmonary disease) ([name])   ? bronchitis/emphysema  ? Coronary artery disease   ? Depression   ? Emphysema lung ([place])   ? Enlarged prostate   ? Fibromyalgia   ? GERD (gastroesophageal reflux disease)   ? Hyperlipidemia   ? Hypertension   ? IBS (irritable bowel syndrome)   ? Migraine   ? Panic attack   ? ? ?Tobacco Use: ?Social History  ? ?Tobacco Use  ?Smoking Status Former  ? Packs/day: 1.50  ? Years: 40.00  ? Pack years: 60.00  ? Types: Cigarettes  ? Quit date: 05/17/2013  ? Years since quitting: 8.1  ?Smokeless Tobacco Never  ?Tobacco Comments  ? Patient wears a nicotine patch daily   ? ? ?Labs: ?Recent Review Flowsheet Data   ? ? Labs for ITP Cardiac and Pulmonary Rehab Latest Ref Rng & Units 03/30/2009 09/30/2012 05/01/2021  ? Cholestrol 100 - 199 mg/dL - - 137  ? LDLCALC 0 - 99 mg/dL - - 74  ? HDL >39 mg/dL - - 50  ? Trlycerides 0 - 149 mg/dL - - 62  ? TCO2 0 - 100 mmol/L 30 28 -  ? ?  ? ? ?Capillary Blood Glucose: ?No results found for: GLUCAP ? ? ?Exercise Target Goals: ?Exercise Program Goal: ?Individual exercise prescription set using results from initial 6 min walk test and THRR while considering  patient?s activity barriers and safety.  ? ?Exercise Prescription Goal: ?Starting with aerobic activity 30 plus minutes a day, 3 days per week for initial exercise prescription. Provide home exercise prescription and guidelines that participant acknowledges understanding prior to discharge. ? ?Activity Barriers & Risk Stratification: ? Activity Barriers & Cardiac Risk Stratification - 05/18/21 1516   ? ?  ? Activity Barriers & Cardiac Risk Stratification  ? Activity Barriers Arthritis;Neck/Spine Problems;Chest Pain/Angina;Back Problems;Balance Concerns;Deconditioning;Joint  Problems   ? Cardiac Risk Stratification High   ? ?  ?  ? ?  ? ? ?6 Minute Walk: ? 6 Minute Walk   ? ? [place] Name 05/18/21 1507  ?  ?  ?  ? 6 Minute Walk  ? Phase Initial    ? Distance 1200 feet    ? Walk Time 6 minutes    ? # of Rest Breaks 0    ? MPH 2.27    ? METS 2.65    ? RPE 12    ? Perceived Dyspnea  0    ? VO2 Peak 9.28    ? Symptoms  Yes (comment)    ? Comments Migraine Triggered by sani wipe. Manageable with rest    ? Resting HR 63 bpm    ? Resting BP 114/70    ? Resting Oxygen Saturation  94 %    ? Exercise Oxygen Saturation  during 6 min walk 95 %    ? Max Ex. HR 87 bpm    ? Max Ex. BP 132/62    ? 2 Minute Post BP 118/62    ? ?  ?  ? ?  ? ? ?Oxygen Initial Assessment: ? ? ?Oxygen Re-Evaluation: ? ? ?Oxygen Discharge (Final Oxygen Re-Evaluation): ? ? ?Initial Exercise Prescription: ? Initial Exercise Prescription - 05/18/21 1500   ? ?  ? Date of Initial Exercise RX and Referring Provider  ? Date 05/18/21   ? Referring Provider Dr. Larae Grooms, MD   ? Expected Discharge Date 07/14/21   ?  ? NuStep  ? Level 1   ? SPM 80   ? Minutes 30   ? METs 1.9   ?  ? Prescription Details  ? Frequency (times per week) 3   ? Duration Progress to 30 minutes of continuous aerobic without signs/symptoms of physical distress   ?  ? Intensity  ? THRR 40-80% of Max Heartrate 62-123   ? Ratings of Perceived Exertion 11-13   ? Perceived Dyspnea 0-4   ?  ? Progression  ? Progression Continue progressive overload as per policy without signs/symptoms or physical distress.   ?  ? Resistance Training  ? Training Prescription Yes   ? Weight 3   ? Reps 10-15   ? ?  ?  ? ?  ? ? ?Perform Capillary Blood Glucose checks as needed. ? ?Exercise Prescription Changes: ? ? Exercise Prescription Changes   ? ? [place] Name 05/24/21 1642 06/16/21 1640  ?  ?  ?  ?  ? Response to Exercise  ? Blood Pressure (Admit) 122/70 114/68     ? Blood Pressure (Exercise) 128/70 118/62     ? Blood Pressure (Exit) 102/66 104/60     ? Heart Rate (Admit) 63 bpm 61  bpm     ? Heart Rate (Exercise) 75 bpm 69 bpm     ? Heart Rate (Exit) 58 bpm 60 bpm     ? Rating of Perceived Exertion (Exercise) 11 12     ? Perceived Dyspnea (Exercise) 0 0     ? Symptoms c/o nausea when

## 2021-07-05 ENCOUNTER — Other Ambulatory Visit: Payer: Self-pay

## 2021-07-05 ENCOUNTER — Encounter (HOSPITAL_COMMUNITY)
Admission: RE | Admit: 2021-07-05 | Discharge: 2021-07-05 | Disposition: A | Discharge status: Home / Self Care | Payer: Medicare Other | Source: Ambulatory Visit | Attending: Interventional Cardiology | Admitting: Interventional Cardiology

## 2021-07-05 DIAGNOSIS — Z955 Presence of coronary angioplasty implant and graft: Secondary | ICD-10-CM | POA: Diagnosis not present

## 2021-07-05 NOTE — Telephone Encounter (Signed)
Lm for patient to contact [place] to give his consent to ship Botox, [facility] phone # [phone]. ?

## 2021-07-07 ENCOUNTER — Other Ambulatory Visit: Payer: Self-pay

## 2021-07-07 ENCOUNTER — Encounter (HOSPITAL_COMMUNITY)
Admission: RE | Admit: 2021-07-07 | Discharge: 2021-07-07 | Disposition: A | Discharge status: Home / Self Care | Payer: Medicare Other | Source: Ambulatory Visit | Attending: Interventional Cardiology | Admitting: Interventional Cardiology

## 2021-07-07 ENCOUNTER — Ambulatory Visit: Payer: Medicare Other | Admitting: Adult Health

## 2021-07-07 DIAGNOSIS — Z955 Presence of coronary angioplasty implant and graft: Secondary | ICD-10-CM | POA: Diagnosis not present

## 2021-07-10 ENCOUNTER — Other Ambulatory Visit: Payer: Self-pay

## 2021-07-10 ENCOUNTER — Encounter (HOSPITAL_COMMUNITY)
Admission: RE | Admit: 2021-07-10 | Discharge: 2021-07-10 | Disposition: A | Discharge status: Home / Self Care | Payer: Medicare Other | Source: Ambulatory Visit | Attending: Interventional Cardiology | Admitting: Interventional Cardiology

## 2021-07-10 DIAGNOSIS — Z955 Presence of coronary angioplasty implant and graft: Secondary | ICD-10-CM | POA: Diagnosis not present

## 2021-07-10 NOTE — Telephone Encounter (Signed)
Received (1) 200 unit vial of Botox from [place].  ?

## 2021-07-11 ENCOUNTER — Ambulatory Visit: Payer: Medicare Other | Admitting: Adult Health

## 2021-07-11 DIAGNOSIS — G43711 Chronic migraine without aura, intractable, with status migrainosus: Secondary | ICD-10-CM | POA: Diagnosis not present

## 2021-07-11 NOTE — Progress Notes (Signed)
? ? ? ? ? ?  BOTOX PROCEDURE NOTE FOR MIGRAINE HEADACHE ? ? ? ?Contraindications and precautions discussed with patient(above). Aseptic procedure was observed and patient tolerated procedure. Procedure performed by Ward Givens, NP ? ?The condition has existed for more than 6 months, and pt does not have a diagnosis of ALS, Myasthenia Gravis or Lambert-Eaton Syndrome.  Risks and benefits of injections discussed and pt agrees to proceed with the procedure.  Written consent obtained ? ?These injections are medically necessary.  These injections do not cause sedations or hallucinations which the oral therapies may cause. ? ?Indication/Diagnosis: chronic migraine ?OMVEH(M0947) injection was performed according to protocol by [facility]. 200 units of BOTOX was dissolved into 4 cc NS.   ?[place]: [phone] ? ?Type of toxin: Botox ?Botox- 200 units x 1 vial ?Lot: [id number] ?Expiration: 12/2023 ?[place]: [phone] ?  ?Bacteriostatic 0.9% Sodium Chloride- 13m total ?Lot: [id number]?Expiration: 11/22/2022 ?[place] [id number]?  ? ?Description of procedure: ? ?The patient was placed in a sitting position. The standard protocol was used for Botox as follows, with 5 units of Botox injected at each site: ? ? ?-Procerus muscle, midline injection ? ?-Corrugator muscle, bilateral injection ? ?-Frontalis muscle, bilateral injection, with 2 sites each side, medial injection was performed in the upper one third of the frontalis muscle, in the region vertical from the medial inferior edge of the superior orbital rim. The lateral injection was again in the upper one third of the forehead vertically above the lateral limbus of the cornea, 1.5 cm lateral to the medial injection site. ? ?-Temporalis muscle injection, 4 sites, bilaterally. The first injection was 3 cm above the tragus of the ear, second injection site was 1.5 cm to 3 cm up from the first injection site in line with the tragus of the ear. The third injection site was 1.5-3 cm  forward between the first 2 injection sites. The fourth injection site was 1.5 cm posterior to the second injection site. ? ?-Occipitalis muscle injection, 3 sites, bilaterally. The first injection was done one half way between the occipital protuberance and the tip of the mastoid process behind the ear. The second injection site was done lateral and superior to the first, 1 fingerbreadth from the first injection. The third injection site was 1 fingerbreadth superiorly and medially from the first injection site. ? ?-Cervical paraspinal muscle injection, 2 sites, bilateral knee first injection site was 1 cm from the midline of the cervical spine, 3 cm inferior to the lower border of the occipital protuberance. The second injection site was 1.5 cm superiorly and laterally to the first injection site. ? ?-Trapezius muscle injection was performed at 3 sites, bilaterally. The first injection site was in the upper trapezius muscle halfway between the inflection point of the neck, and the acromion. The second injection site was one half way between the acromion and the first injection site. The third injection was done between the first injection site and the inflection point of the neck. ? ? ?Will return for repeat injection in 3 months. ? ? ?A 200 units of Botox was used, 155 units were injected, the rest of the Botox was wasted. The patient tolerated the procedure well, there were no complications of the above procedure. ? ?MWard Givens MSN, NP-C 07/11/2021, 2:33 PM ?[facility] ?[address] ?[place] [address]?[phone] ?

## 2021-07-11 NOTE — Progress Notes (Signed)
Botox consent signed ? ?Botox- 200 units x 1 vial ?Lot: [id number] ?Expiration: 12/2023 ?[place]: [phone] ? ?Bacteriostatic 0.9% Sodium Chloride- 75m total ?Lot: [id number]?Expiration: 11/22/2022 ?[place] [id number]? ?Dx: GF58.441?S/P  ?

## 2021-07-11 NOTE — Telephone Encounter (Signed)
Received an approval notice from [facility]. Patient has been approved for Qulipta #30 count and Ubrelvy #10 count through 04/22/2022. The patient or provider will need to contact [facility] for ongoing refills. To place an order, call [phone] approximately 2 weeks before the medication is needed. We will have the patient do this.  ? ?Copy of approval letter sent to medical records for scanning. Updated pt in mychart.  ?

## 2021-07-12 ENCOUNTER — Encounter (HOSPITAL_COMMUNITY): Payer: Medicare Other

## 2021-07-12 ENCOUNTER — Telehealth (HOSPITAL_COMMUNITY): Payer: Self-pay | Admitting: *Deleted

## 2021-07-12 NOTE — Telephone Encounter (Signed)
Patient left message on department voicemail. Patient has a migraine and will be absent from cardiac rehab today.  ?

## 2021-07-12 NOTE — Telephone Encounter (Signed)
Confirmed that Mr Bostelman will not be here today due to having a migraine.Barnet Pall, RN,BSN ?07/12/2021 11:59 AM  ?

## 2021-07-14 ENCOUNTER — Encounter (HOSPITAL_COMMUNITY)
Admission: RE | Admit: 2021-07-14 | Discharge: 2021-07-14 | Disposition: A | Discharge status: Home / Self Care | Payer: Medicare Other | Source: Ambulatory Visit | Attending: Interventional Cardiology | Admitting: Interventional Cardiology

## 2021-07-14 ENCOUNTER — Other Ambulatory Visit: Payer: Self-pay

## 2021-07-14 DIAGNOSIS — Z955 Presence of coronary angioplasty implant and graft: Secondary | ICD-10-CM

## 2021-07-16 ENCOUNTER — Emergency Department (HOSPITAL_COMMUNITY)
Admission: EM | Admit: 2021-07-16 | Discharge: 2021-07-16 | Disposition: A | Discharge status: Home / Self Care | Payer: Medicare Other | Attending: Emergency Medicine | Admitting: Emergency Medicine

## 2021-07-16 ENCOUNTER — Encounter (HOSPITAL_COMMUNITY): Payer: Self-pay | Admitting: Emergency Medicine

## 2021-07-16 ENCOUNTER — Other Ambulatory Visit: Payer: Self-pay

## 2021-07-16 ENCOUNTER — Emergency Department (HOSPITAL_COMMUNITY): Payer: Medicare Other

## 2021-07-16 DIAGNOSIS — R079 Chest pain, unspecified: Secondary | ICD-10-CM | POA: Diagnosis not present

## 2021-07-16 DIAGNOSIS — Z79899 Other long term (current) drug therapy: Secondary | ICD-10-CM | POA: Insufficient documentation

## 2021-07-16 DIAGNOSIS — I10 Essential (primary) hypertension: Secondary | ICD-10-CM | POA: Insufficient documentation

## 2021-07-16 DIAGNOSIS — Z7902 Long term (current) use of antithrombotics/antiplatelets: Secondary | ICD-10-CM | POA: Insufficient documentation

## 2021-07-16 DIAGNOSIS — Z7901 Long term (current) use of anticoagulants: Secondary | ICD-10-CM | POA: Insufficient documentation

## 2021-07-16 DIAGNOSIS — I2510 Atherosclerotic heart disease of native coronary artery without angina pectoris: Secondary | ICD-10-CM | POA: Diagnosis not present

## 2021-07-16 DIAGNOSIS — R110 Nausea: Secondary | ICD-10-CM | POA: Diagnosis not present

## 2021-07-16 DIAGNOSIS — R0789 Other chest pain: Secondary | ICD-10-CM | POA: Diagnosis not present

## 2021-07-16 DIAGNOSIS — J449 Chronic obstructive pulmonary disease, unspecified: Secondary | ICD-10-CM | POA: Diagnosis not present

## 2021-07-16 DIAGNOSIS — Z7951 Long term (current) use of inhaled steroids: Secondary | ICD-10-CM | POA: Diagnosis not present

## 2021-07-16 DIAGNOSIS — I517 Cardiomegaly: Secondary | ICD-10-CM | POA: Diagnosis not present

## 2021-07-16 DIAGNOSIS — R1084 Generalized abdominal pain: Secondary | ICD-10-CM | POA: Insufficient documentation

## 2021-07-16 LAB — COMPREHENSIVE METABOLIC PANEL
ALT: 29 U/L (ref 0–44)
AST: 32 U/L (ref 15–41)
Albumin: 3.8 g/dL (ref 3.5–5.0)
Alkaline Phosphatase: 67 U/L (ref 38–126)
Anion gap: 7 (ref 5–15)
BUN: 11 mg/dL (ref 8–23)
CO2: 29 mmol/L (ref 22–32)
Calcium: 9.8 mg/dL (ref 8.9–10.3)
Chloride: 100 mmol/L (ref 98–111)
Creatinine, Ser: 1.17 mg/dL (ref 0.61–1.24)
GFR, Estimated: >60 mL/min (ref >60)
Glucose, Bld: 91 mg/dL (ref 70–99)
Potassium: 3.9 mmol/L (ref 3.5–5.1)
Sodium: 136 mmol/L (ref 135–145)
Total Bilirubin: 0.5 mg/dL (ref 0.3–1.2)
Total Protein: 7.2 g/dL (ref 6.5–8.1)

## 2021-07-16 LAB — CBC
HCT: 42.8 % (ref 39.0–52.0)
Hemoglobin: 14 g/dL (ref 13.0–17.0)
MCH: 30.2 pg (ref 26.0–34.0)
MCHC: 32.7 g/dL (ref 30.0–36.0)
MCV: 92.4 fL (ref 80.0–100.0)
Platelets: 244 10*3/uL (ref 150–400)
RBC: 4.63 MIL/uL (ref 4.22–5.81)
RDW: 13.4 % (ref 11.5–15.5)
WBC: 8.1 10*3/uL (ref 4.0–10.5)
nRBC: 0 % (ref 0.0–0.2)

## 2021-07-16 LAB — URINALYSIS, ROUTINE W REFLEX MICROSCOPIC
Bilirubin Urine: NEGATIVE
Glucose, UA: NEGATIVE mg/dL
Hgb urine dipstick: NEGATIVE
Ketones, ur: NEGATIVE mg/dL
Leukocytes,Ua: NEGATIVE
Nitrite: NEGATIVE
Protein, ur: NEGATIVE mg/dL
Specific Gravity, Urine: 1.004 — ABNORMAL LOW (ref 1.005–1.030)
pH: 7 (ref 5.0–8.0)

## 2021-07-16 LAB — TROPONIN I (HIGH SENSITIVITY)
Troponin I (High Sensitivity): 12 ng/L (ref <18)
Troponin I (High Sensitivity): 15 ng/L (ref <18)

## 2021-07-16 LAB — PROTIME-INR
INR: 2.1 — ABNORMAL HIGH (ref 0.8–1.2)
Prothrombin Time: 23.3 seconds — ABNORMAL HIGH (ref 11.4–15.2)

## 2021-07-16 LAB — LIPASE, BLOOD: Lipase: 24 U/L (ref 11–51)

## 2021-07-16 MED ORDER — FAMOTIDINE IN NACL 20-0.9 MG/50ML-% IV SOLN
20.0000 mg | Freq: Once | INTRAVENOUS | Status: AC
  Administered 2021-07-16: 20 mg via INTRAVENOUS
  Filled 2021-07-16: qty 50

## 2021-07-16 MED ORDER — LIDOCAINE VISCOUS HCL 2 % MT SOLN
15.0000 mL | Freq: Once | OROMUCOSAL | Status: AC
  Administered 2021-07-16: 15 mL via ORAL
  Filled 2021-07-16: qty 15

## 2021-07-16 MED ORDER — ALUM & MAG HYDROXIDE-SIMETH 200-200-20 MG/5ML PO SUSP
30.0000 mL | Freq: Once | ORAL | Status: AC
  Administered 2021-07-16: 30 mL via ORAL
  Filled 2021-07-16: qty 30

## 2021-07-16 NOTE — ED Triage Notes (Signed)
Pt reports pain to center and slightly to right of chest since Friday night that started as "GERD".  Reports burning in throat and R sided abd pain since last night with nausea.  History of MI- currently goes to cardiac rehab.   ?

## 2021-07-16 NOTE — ED Notes (Signed)
Pt provided discharge instructions and prescription information. Pt was given the opportunity to ask questions and questions were answered. Discharge signature not obtained in the setting of the COVID-19 pandemic in order to reduce high touch surfaces.  ° °

## 2021-07-16 NOTE — Discharge Instructions (Signed)
Please return to the ED with any new or worsening signs or symptoms of shortness of breath, leg swelling, chest pain ?Please follow-up with your cardiologist as we discussed ?Please follow-up with your GI doctor as we discussed ?Please continue eating a diet that does not exacerbate your heartburn ?

## 2021-07-16 NOTE — ED Provider Notes (Signed)
?[name] ?Provider Note ? ? ?CSN: [id number] ?Arrival date & time: 07/16/21  1003 ? ?  ? ?History ? ?Chief Complaint  ?Patient presents with  ? Chest Pain  ? Abdominal Pain  ? ? ?Kyle Walters is a 66 y.o. male with history of ADHD, anxiety, COPD, CAD, lung emphysema, depression, GERD, hypertension, panic attacks, MI in January.  Patient reports to ED for evaluation of chest pain and abdominal pain he has been experiencing since Friday.  Patient states that the pain is located in middle of his chest and he noticed it on Friday night when he woke up with acid in his mouth.  Patient states he believes that this chest pain is related to his GERD.  Patient also complaining of abdominal pain x3 weeks, states abdominal pain is nonlocalized.  Patient states the chest pain is nonexertional, nonradiating.  Patient states the chest pain is not constant and is described as "burning".  Patient cannot describe abdominal pain, cannot describe pinpoint location.  Patient endorsing chest pain, abdominal pain, nausea.  Patient denies shortness of breath, leg swelling, fevers, vomiting, diarrhea, dysuria. ? ? ?Chest Pain ?Associated symptoms: abdominal pain and nausea   ?Associated symptoms: no fever, no shortness of breath and no vomiting   ?Abdominal Pain ?Associated symptoms: chest pain and nausea   ?Associated symptoms: no chills, no diarrhea, no dysuria, no fever, no shortness of breath and no vomiting   ? ?  ? ?Home Medications ?Prior to Admission medications   ?Medication Sig Start Date End Date Taking? Authorizing Provider  ?acetaminophen (TYLENOL) 500 MG tablet Take 1,000 mg by mouth every 6 (six) hours as needed for mild pain.   Yes [provider]  ?ALPRAZolam Duanne Moron) 1 MG tablet Take 1 mg by mouth 4 (four) times daily.    Yes [provider]  ?B Complex-Biotin-FA (B-50 COMPLEX PO) Take 1 capsule by mouth daily.   Yes [provider]  ?BREO ELLIPTA 100-25  MCG/INH AEPB INHALE 1 PUFF BY MOUTH EVERY DAY ?Patient taking differently: Inhale 1 puff into the lungs daily. 11/07/20  Yes Collene Gobble, MD  ?Cholecalciferol (VITAMIN D3) 2000 UNITS TABS Take 4,000 Units by mouth daily after breakfast.   Yes [provider]  ?clopidogrel (PLAVIX) 75 MG tablet Take 1 tablet (75 mg total) by mouth daily with breakfast. 06/07/21  Yes Jettie Booze, MD  ?doxazosin (CARDURA) 8 MG tablet Take 8 mg by mouth daily.   Yes [provider]  ?escitalopram (LEXAPRO) 20 MG tablet Take 10 mg by mouth in the morning. 10/26/14  Yes [provider]  ?Ferrous Sulfate (IRON PO) Take 65 mg by mouth daily after breakfast. Hema Plex Iron   Yes [provider]  ?gabapentin (NEURONTIN) 400 MG capsule Take 400 mg by mouth 4 (four) times daily.    Yes [provider]  ?MAGNESIUM MALATE PO Take 400 mg by mouth daily after breakfast.   Yes [provider]  ?memantine (NAMENDA) 10 MG tablet Take 20 mg in the morning and 10 mg in the late afternoon ?Patient taking differently: Take 10-20 mg by mouth See admin instructions. Take 20 mg in the morning and 10 mg by mouth with supper 02/27/21  Yes Dohmeier, Asencion Partridge, MD  ?Multiple Vitamin (MULTIVITAMIN WITH MINERALS) TABS tablet Take 1 tablet by mouth daily after breakfast. Centrum Silver for Men   Yes [provider]  ?Nerve Stimulator (NERIVIO) DEVI USE WITHIN 1 HR OF MIGRAINE. SET A  STRONG YET COMFORTABLE INTENSITY LEVEL & MAINTAIN LEVEL FOR 45 MINS 04/20/21  Yes Melvenia Beam, MD  ?nicotine (NICODERM CQ - DOSED IN MG/24 HOURS) 14 mg/24hr patch Place 14 mg onto the skin daily.   Yes [provider]  ?nitroGLYCERIN (NITROSTAT) 0.4 MG SL tablet Place 1 tablet (0.4 mg total) under the tongue every 5 (five) minutes as needed. ?Patient taking differently: Place 0.4 mg under the tongue every 5 (five) minutes as needed for chest pain. 03/14/21  Yes Cheryln Manly, NP  ?Omega-3 Fatty Acids  (FISH OIL) 1200 MG CAPS Take 1,200 mg by mouth daily after breakfast.   Yes [provider]  ?pantoprazole (PROTONIX) 20 MG tablet Take 1 tablet (20 mg total) by mouth daily. 03/20/21  Yes Jettie Booze, MD  ?phenylephrine-shark liver oil-mineral oil-petrolatum (PREPARATION H) 0.25-14-74.9 % rectal ointment Place 1 application rectally 2 (two) times daily as needed for hemorrhoids.   Yes [provider]  ?rosuvastatin (CRESTOR) 20 MG tablet Take 1 tablet (20 mg total) by mouth daily. 01/27/21  Yes Jettie Booze, MD  ?silodosin (RAPAFLO) 8 MG CAPS capsule Take 8 mg by mouth daily with breakfast.   Yes [provider]  ?Ubrogepant (UBRELVY) 100 MG TABS TAKE 1 TABLET MOUTH AS NEEDED AS CLOSE TO MIGRAINE ONSET AS POSSIBLE. MAY REPEAT IN 2 HOURS IF NEEDED. MAX 200 MG PER 24 HOURS. ?Patient taking differently: Take 100 mg by mouth as directed. Take 1 tablet as close to migraine onset as possible. May repeat in 2 hours if needed, max dose 200 mg /daily. 06/01/21  Yes Melvenia Beam, MD  ?vitamin B-12 (CYANOCOBALAMIN) 500 MCG tablet Take 500 mcg by mouth daily.   Yes [provider]  ?warfarin (COUMADIN) 5 MG tablet Take 1/2 to 1 tablet by mouth once daily as directed by Anticoagulation Clinic ?Patient taking differently: Take 2.5-5 mg by mouth See admin instructions. Take 1/2 to 1 tablet by mouth once daily as directed by Anticoagulation Clinic; 5 mg Tues Thurs and Sat, and 2.5 mg all other days 04/19/21  Yes Jettie Booze, MD  ?Botulinum Toxin Type A (BOTOX) 200 units SOLR Provider to inject 155 units into the muscles of the head and neck every 3 months. Discard remainder. 06/29/21   Melvenia Beam, MD  ?   ? ?Allergies    ?Tetracycline, Prednisone, Sulfamethoxazole-trimethoprim, Triptans, and Effexor [venlafaxine hcl]   ? ?Review of Systems   ?Review of Systems  ?Constitutional:  Negative for chills and fever.  ?Respiratory:  Negative for shortness of breath.    ?Cardiovascular:  Positive for chest pain. Negative for leg swelling.  ?Gastrointestinal:  Positive for abdominal pain and nausea. Negative for diarrhea and vomiting.  ?Genitourinary:  Negative for dysuria.  ?All other systems reviewed and are negative. ? ?Physical Exam ?Updated Vital Signs ?BP 119/61   Pulse (!) 52   Temp 98.1 ?F (36.7 ?C) (Oral)   Resp 15   SpO2 94%  ?Physical Exam ?Vitals and nursing note reviewed.  ?Constitutional:   ?   General: He is not in acute distress. ?   Appearance: He is well-developed. He is not ill-appearing, toxic-appearing or diaphoretic.  ?HENT:  ?   Head: Normocephalic and atraumatic.  ?   Nose: Nose normal.  ?   Mouth/Throat:  ?   Mouth: Mucous membranes are moist.  ?   Pharynx: Oropharynx is clear.  ?Eyes:  ?   Extraocular Movements: Extraocular movements intact.  ?  Conjunctiva/sclera: Conjunctivae normal.  ?   Pupils: Pupils are equal, round, and reactive to light.  ?Neck:  ?   Vascular: No JVD.  ?Cardiovascular:  ?   Rate and Rhythm: Normal rate and regular rhythm.  ?Pulmonary:  ?   Effort: Pulmonary effort is normal. No tachypnea.  ?   Breath sounds: Normal breath sounds. No decreased breath sounds, wheezing, rhonchi or rales.  ?Chest:  ?   Chest wall: Tenderness present. No mass or crepitus.  ?Abdominal:  ?   General: Bowel sounds are normal. There is no distension.  ?   Palpations: Abdomen is soft.  ?   Tenderness: There is generalized abdominal tenderness.  ?Musculoskeletal:     ?   General: No swelling or tenderness.  ?   Cervical back: Normal range of motion and neck supple.  ?   Right lower leg: No edema.  ?   Left lower leg: No edema.  ?Skin: ?   General: Skin is warm and dry.  ?   Capillary Refill: Capillary refill takes less than 2 seconds.  ?Neurological:  ?   Mental Status: He is alert and oriented to person, place, and time.  ?Psychiatric:     ?   Mood and Affect: Mood normal.     ?   Behavior: Behavior normal.  ? ? ?ED Results / Procedures / Treatments    ?Labs ?(all labs ordered are listed, but only abnormal results are displayed) ?Labs Reviewed  ?URINALYSIS, ROUTINE W REFLEX MICROSCOPIC - Abnormal; Notable for the following components:  ?    Result Value  ? S

## 2021-07-17 ENCOUNTER — Ambulatory Visit (HOSPITAL_COMMUNITY): Payer: Medicare Other

## 2021-07-17 ENCOUNTER — Telehealth (HOSPITAL_COMMUNITY): Payer: Self-pay | Admitting: *Deleted

## 2021-07-17 ENCOUNTER — Other Ambulatory Visit: Payer: Self-pay | Admitting: Interventional Cardiology

## 2021-07-17 NOTE — Telephone Encounter (Signed)
-----   Message from Jettie Booze, MD sent at 07/17/2021  2:47 PM EDT ----- ?Regarding: RE: Return to cardiac rehab ?Yes.  [place] with cardiac rehab ?----- Message ----- ?From: Magda Kiel, RN ?Sent: 07/17/2021   1:37 PM EDT ?To: Jettie Booze, MD ?Subject: Return to cardiac rehab                       ? ?Good afternoon Dr Beau Fanny, ? ?Mr Lad went to the ED yesterday. His work up was negative as he was given a GI cocktail and discharged. Are you okay with Mr Ickes returning to exercise? Mr Bejar will complete cardiac rehab at the end of the week. ? ?Thanks for your input! ? ?Sincerely, ?Barnet Pall RN ?Cardiac Rehab ? ? ?

## 2021-07-17 NOTE — Telephone Encounter (Signed)
Prescription refill request received for warfarin ?Lov: 03/28/21 [place])  ?Next INR check: 07/31/21 ?Warfarin tablet strength: '5mg'$  ? ?Appropriate dose and refill sent to requested pharmacy.  ?

## 2021-07-19 ENCOUNTER — Encounter (HOSPITAL_COMMUNITY)
Admission: RE | Admit: 2021-07-19 | Discharge: 2021-07-19 | Disposition: A | Discharge status: Home / Self Care | Payer: Medicare Other | Source: Ambulatory Visit | Attending: Interventional Cardiology | Admitting: Interventional Cardiology

## 2021-07-19 VITALS — Ht 66.0 in | Wt 184.3 lb

## 2021-07-19 DIAGNOSIS — Z955 Presence of coronary angioplasty implant and graft: Secondary | ICD-10-CM | POA: Diagnosis not present

## 2021-07-19 NOTE — Progress Notes (Addendum)
Discharge Progress Report ? ?Patient Details  ?Name: Kyle Walters ?MRN: [id number] ?Date of Birth: 24-Sep-1955 ?Referring Provider:   ?Flowsheet Row CARDIAC REHAB PHASE II ORIENTATION from 05/18/2021 in [facility]  ?Referring Provider Dr. Larae Grooms, MD  ? ?  ? ? ? ?Number of Visits: 19 ? ?Reason for Discharge:  ?Patient reached a stable level of exercise. ?Patient has met program and personal goals. ? ?Smoking History:  ?Social History  ? ?Tobacco Use  ?Smoking Status Former  ? Packs/day: 1.50  ? Years: 40.00  ? Pack years: 60.00  ? Types: Cigarettes  ? Quit date: 05/17/2013  ? Years since quitting: 8.2  ?Smokeless Tobacco Never  ?Tobacco Comments  ? Patient wears a nicotine patch daily   ? ? ?Diagnosis:  ?03/14/21 S/P DESx2 RCA ? ?ADL UCSD: ? ? ?Initial Exercise Prescription: ? Initial Exercise Prescription - 05/18/21 1500   ? ?  ? Date of Initial Exercise RX and Referring Provider  ? Date 05/18/21   ? Referring Provider Dr. Larae Grooms, MD   ? Expected Discharge Date 07/14/21   ?  ? NuStep  ? Level 1   ? SPM 80   ? Minutes 30   ? METs 1.9   ?  ? Prescription Details  ? Frequency (times per week) 3   ? Duration Progress to 30 minutes of continuous aerobic without signs/symptoms of physical distress   ?  ? Intensity  ? THRR 40-80% of Max Heartrate 62-123   ? Ratings of Perceived Exertion 11-13   ? Perceived Dyspnea 0-4   ?  ? Progression  ? Progression Continue progressive overload as per policy without signs/symptoms or physical distress.   ?  ? Resistance Training  ? Training Prescription Yes   ? Weight 3   ? Reps 10-15   ? ?  ?  ? ?  ? ? ?Discharge Exercise Prescription (Final Exercise Prescription Changes): ? Exercise Prescription Changes - 07/19/21 1632   ? ?  ? Response to Exercise  ? Blood Pressure (Admit) 122/58   ? Blood Pressure (Exercise) 122/66   ? Blood Pressure (Exit) 118/68   ? Heart Rate (Admit) 64 bpm   ? Heart Rate (Exercise) 86 bpm   ? Heart Rate (Exit)  59 bpm   ? Rating of Perceived Exertion (Exercise) 11.5   ? Perceived Dyspnea (Exercise) 0   ? Comments Pt graduated the CRP2 program   ? Duration Progress to 30 minutes of  aerobic without signs/symptoms of physical distress   ? Intensity THRR unchanged   ?  ? Progression  ? Progression Continue to progress workloads to maintain intensity without signs/symptoms of physical distress.   ? Average METs 1.7   ?  ? Resistance Training  ? Training Prescription No   ?  ? NuStep  ? Level 2   ? SPM 80   ? Minutes 30   ? METs 1.7   ?  ? Home Exercise Plan  ? Plans to continue exercise at Home (comment)   ? Frequency Add 2 additional days to program exercise sessions.   ? Initial Home Exercises Provided 06/16/21   ? ?  ?  ? ?  ? ? ?Functional Capacity: ? 6 Minute Walk   ? ? [place] Name 05/18/21 1507 07/19/21 1630  ?  ?  ? 6 Minute Walk  ? Phase Initial Discharge   ? Distance 1200 feet 1277 feet   ? Distance % Change -- 6.42 %   ?  Distance Feet Change -- 77 ft   ? Walk Time 6 minutes 6 minutes   ? # of Rest Breaks 0 0   ? MPH 2.27 2.37   ? METS 2.65 4.51   ? RPE 12 12   ? Perceived Dyspnea  0 0   ? VO2 Peak 9.28 15.79   ? Symptoms Yes (comment) No   ? Comments Migraine Triggered by sani wipe. Manageable with rest --   ? Resting HR 63 bpm 64 bpm   ? Resting BP 114/70 122/58   ? Resting Oxygen Saturation  94 % 93 %   ? Exercise Oxygen Saturation  during 6 min walk 95 % 95 %   ? Max Ex. HR 87 bpm 86 bpm   ? Max Ex. BP 132/62 122/66   ? 2 Minute Post BP 118/62 118/68   ? ?  ?  ? ?  ? ? ?Psychological, QOL, Others - Outcomes: ?PHQ 2/9: ? ?  07/19/2021  ?  3:53 PM 05/24/2021  ?  4:10 PM  ?Depression screen PHQ 2/9  ?Decreased Interest 0 0  ?Down, Depressed, Hopeless 0 2  ?PHQ - 2 Score 0 2  ?Altered sleeping  3  ?Tired, decreased energy  1  ?Change in appetite  1  ?Feeling bad or failure about yourself   3  ?Trouble concentrating  2  ?Moving slowly or fidgety/restless  0  ?Suicidal thoughts  0  ?PHQ-9 Score  12  ?Difficult doing  work/chores  Somewhat difficult  ? ? ?Quality of Life: ? Quality of Life - 07/19/21 1658   ? ?  ? Quality of Life  ? Select Quality of Life   ?  ? Quality of Life Scores  ? Health/Function Post 20.21 %   ? Socioeconomic Post 16.14 %   ? Psych/Spiritual Post 17.75 %   ? Family Post 22.88 %   ? GLOBAL Post 19.27 %   ? ?  ?  ? ?  ? ? ?Personal Goals: ?Goals established at orientation with interventions provided to work toward goal. ? Personal Goals and Risk Factors at Admission - 05/18/21 1527   ? ?  ? Core Components/Risk Factors/Patient Goals on Admission  ?  Weight Management Yes   ? Intervention Weight Management: Develop a combined nutrition and exercise program designed to reach desired caloric intake, while maintaining appropriate intake of nutrient and fiber, sodium and fats, and appropriate energy expenditure required for the weight goal.;Weight Management: Provide education and appropriate resources to help participant work on and attain dietary goals.;Weight Management/Obesity: Establish reasonable short term and long term weight goals.;Obesity: Provide education and appropriate resources to help participant work on and attain dietary goals.   ? Admit Weight 187 lb 6.3 oz (85 kg)   ? Expected Outcomes Short Term: Continue to assess and modify interventions until short term weight is achieved;Long Term: Adherence to nutrition and physical activity/exercise program aimed toward attainment of established weight goal;Weight Loss: Understanding of general recommendations for a balanced deficit meal plan, which promotes 1-2 lb weight loss per week and includes a negative energy balance of [phone] kcal/d;Understanding recommendations for meals to include 15-35% energy as protein, 25-35% energy from fat, 35-60% energy from carbohydrates, less than 211m of dietary cholesterol, 20-35 gm of total fiber daily;Understanding of distribution of calorie intake throughout the day with the consumption of 4-5 meals/snacks    ? Hypertension Yes   ? Intervention Provide education on lifestyle modifcations including regular physical activity/exercise, weight management,  moderate sodium restriction and increased consumption of fresh fruit, vegetables, and low fat dairy, alcohol moderation, and smoking cessation.;Monitor prescription use compliance.   ? Expected Outcomes Short Term: Continued assessment and intervention until BP is < 140/35m HG in hypertensive participants. < 130/867mHG in hypertensive participants with diabetes, heart failure or chronic kidney disease.;Long Term: Maintenance of blood pressure at goal levels.   ? Lipids Yes   ? Intervention Provide education and support for participant on nutrition & aerobic/resistive exercise along with prescribed medications to achieve LDL <706mHDL >49m62m ? Expected Outcomes Short Term: Participant states understanding of desired cholesterol values and is compliant with medications prescribed. Participant is following exercise prescription and nutrition guidelines.;Long Term: Cholesterol controlled with medications as prescribed, with individualized exercise RX and with personalized nutrition plan. Value goals: LDL < 70mg80mL > 40 mg.   ? Stress Yes   ? Intervention Offer individual and/or small group education and counseling on adjustment to heart disease, stress management and health-related lifestyle change. Teach and support self-help strategies.;Refer participants experiencing significant psychosocial distress to appropriate mental health specialists for further evaluation and treatment. When possible, include family members and significant others in education/counseling sessions.   ? Expected Outcomes Short Term: Participant demonstrates changes in health-related behavior, relaxation and other stress management skills, ability to obtain effective social support, and compliance with psychotropic medications if prescribed.;Long Term: Emotional wellbeing is indicated by absence  of clinically significant psychosocial distress or social isolation.   ? Personal Goal Other Yes   ? Personal Goal Long and short the same: lose wt and strengthen heart muscle   ? Intervention Will continue to m

## 2021-07-22 NOTE — Progress Notes (Signed)
?  ?Cardiology Office Note ? ? ?Date:  07/24/2021  ? ?ID:  Kyle Walters, DOB December 27, 1955, MRN [id number] ? ?PCP:  Elwyn Reach, MD  ? ? ?Chief Complaint  ?Patient presents with  ? Follow-up  ? ?CAD ? ?Wt Readings from Last 3 Encounters:  ?07/24/21 186 lb (84.4 kg)  ?07/19/21 184 lb 4.9 oz (83.6 kg)  ?05/19/21 186 lb (84.4 kg)  ?  ? ?  ?History of Present Illness: ?Kyle Walters is a 66 y.o. male   with left ventricular dysfunction. ?  ?Patient had a lung cancer screening CT.  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  He was set up for echocardiogram which was done today in our office.  This revealed an LV apical aneurysm and there appeared to be thrombus in the aneurysm with Definity contrast injection. ?  ?He had a stress test many years ago no prior catheterization or known MI that he has had.  He does have anxiety and panic issues and has had chest pain on multiple occasions.  He actually went to the emergency room due to anxiety causing chest pain, from reading the CT scan report.  His troponin in the ER was normal.  He was sent home.   ?  ?He denies any exertional angina.  His exercise level has dropped off in the last few years.  He has migraines stimulated by any type of smell.  This has limited the amount that he goes outside. ?  ?Of note, his ECG done in 2022 appeared to show poor R wave progression.  When I look back at prior ECG from 2017, he had normal R wave progression.  No other cardiac testing in that time. ?  ?Echo showed LV thrombus and he was started on ELiquis. CTA of the coronaries was done.  This showed an occluded LAD.  There was a possible stenosis in the RCA which was medically treated for some time.  His circumflex and left main were widely patent.  Cardiac MRI showed nonviable apex.  ?  ?He took Eliquis for 3 months and then had cath in 02/2021: ?"Mid LAD lesion is 100% stenosed.  Left to left collaterals.  The apex was nonviable by cardiac MRI. ?  RPAV lesion is 100% stenosed.  Left to  right collaterals. ?  Prox RCA lesion is 90% stenosed. ?  A drug-eluting stent was successfully placed using a STENT ONYX FRONTIER 3.0X34, postdilated to greater than 3.75 mm. ?  Post intervention, there is a 0% residual stenosis. ?  Mid RCA lesion is 75% stenosed. ?  A drug-eluting stent was successfully placed using a [place] 2.75X22. ?  Post intervention, there is a 0% residual stenosis. ?  LV end diastolic pressure is normal. ?  There is no aortic valve stenosis. ?  ?Continue dual antiplatelet therapy with aspirin and Plavix.  He has completed 3 months of anticoagulation with Eliquis for his LV thrombus.  Continue aggressive secondary prevention including high-dose statin.  Prevacid will have to be changed to Protonix." ?  ?There was concern for recurrence of LV thrombus since his apex was found to be nonviable.  ?  ?He restarted Boswellia after the cath and started having more dehydration and headaches.  ? ?In March 2023, he was feeling some chest pain that he thought was reflux.  Went to the ER, work-up was negative for any heart attack,  heart failure.  Symptoms got better with a GI cocktail. ? ?He has modified his diet to  reduce GERD.  Less Tabasco sauce, no caffeine, avoids eating late at night. ? ?Denies : exertional chest pain. Dizziness. Leg edema. Nitroglycerin use. Orthopnea. Palpitations. Paroxysmal nocturnal dyspnea. Shortness of breath. Syncope.  ? ?Finished cardiac rehab ? ? ? ? ?Past Medical History:  ?Diagnosis Date  ? ADHD (attention deficit hyperactivity disorder)   ? Anemia   ? Anxiety panic attack  ? Arthritis   ? COPD (chronic obstructive pulmonary disease) ([place])   ? bronchitis/emphysema  ? Coronary artery disease   ? Depression   ? Emphysema lung ([place])   ? Enlarged prostate   ? Fibromyalgia   ? GERD (gastroesophageal reflux disease)   ? Hyperlipidemia   ? Hypertension   ? IBS (irritable bowel syndrome)   ? Migraine   ? Panic attack   ? ? ?Past Surgical History:  ?Procedure  Laterality Date  ? CARDIAC CATHETERIZATION    ? CORONARY STENT INTERVENTION N/A 03/14/2021  ? Procedure: CORONARY STENT INTERVENTION;  Surgeon: Jettie Booze, MD;  Location: [facility];  Service: Cardiovascular;  Laterality: N/A;  ? LEFT HEART CATH AND CORONARY ANGIOGRAPHY N/A 03/14/2021  ? Procedure: LEFT HEART CATH AND CORONARY ANGIOGRAPHY;  Surgeon: Jettie Booze, MD;  Location: [facility];  Service: Cardiovascular;  Laterality: N/A;  ? NO PAST SURGERIES    ? ? ? ?Current Outpatient Medications  ?Medication Sig Dispense Refill  ? acetaminophen (TYLENOL) 500 MG tablet Take 1,000 mg by mouth every 6 (six) hours as needed for mild pain.    ? ALPRAZolam (XANAX) 1 MG tablet Take 1 mg by mouth 4 (four) times daily.     ? B Complex-Biotin-FA (B-50 COMPLEX PO) Take 1 capsule by mouth daily.    ? Botulinum Toxin Type A (BOTOX) 200 units SOLR Provider to inject 155 units into the muscles of the head and neck every 3 months. Discard remainder. 1 each 3  ? BREO ELLIPTA 100-25 MCG/INH AEPB INHALE 1 PUFF BY MOUTH EVERY DAY (Patient taking differently: Inhale 1 puff into the lungs daily.) 60 each 10  ? Cholecalciferol (VITAMIN D3) 2000 UNITS TABS Take 4,000 Units by mouth daily after breakfast.    ? clopidogrel (PLAVIX) 75 MG tablet Take 1 tablet (75 mg total) by mouth daily with breakfast. 90 tablet 3  ? doxazosin (CARDURA) 8 MG tablet Take 8 mg by mouth daily.    ? escitalopram (LEXAPRO) 20 MG tablet Take 10 mg by mouth in the morning.  2  ? Ferrous Sulfate (IRON PO) Take 65 mg by mouth daily after breakfast. Hema Plex Iron    ? gabapentin (NEURONTIN) 400 MG capsule Take 400 mg by mouth 4 (four) times daily.     ? MAGNESIUM MALATE PO Take 400 mg by mouth daily after breakfast.    ? memantine (NAMENDA) 10 MG tablet Take 20 mg in the morning and 10 mg in the late afternoon (Patient taking differently: Take 10-20 mg by mouth See admin instructions. Take 20 mg in the morning and 10 mg by mouth with  supper) 90 tablet 0  ? Multiple Vitamin (MULTIVITAMIN WITH MINERALS) TABS tablet Take 1 tablet by mouth daily after breakfast. Centrum Silver for Men    ? Nerve Stimulator (NERIVIO) DEVI USE WITHIN 1 HR OF MIGRAINE. SET A STRONG YET COMFORTABLE INTENSITY LEVEL & MAINTAIN LEVEL FOR 45 MINS 1 each 12  ? nicotine (NICODERM CQ - DOSED IN MG/24 HOURS) 14 mg/24hr patch Place 14 mg onto the skin daily.    ?  nitroGLYCERIN (NITROSTAT) 0.4 MG SL tablet Place 1 tablet (0.4 mg total) under the tongue every 5 (five) minutes as needed. (Patient taking differently: Place 0.4 mg under the tongue every 5 (five) minutes as needed for chest pain.) 25 tablet 2  ? Omega-3 Fatty Acids (FISH OIL) 1200 MG CAPS Take 1,200 mg by mouth daily after breakfast.    ? pantoprazole (PROTONIX) 20 MG tablet Take 1 tablet (20 mg total) by mouth daily. 90 tablet 3  ? phenylephrine-shark liver oil-mineral oil-petrolatum (PREPARATION H) 0.25-14-74.9 % rectal ointment Place 1 application rectally 2 (two) times daily as needed for hemorrhoids.    ? rosuvastatin (CRESTOR) 20 MG tablet Take 1 tablet (20 mg total) by mouth daily. 90 tablet 3  ? silodosin (RAPAFLO) 8 MG CAPS capsule Take 8 mg by mouth daily with breakfast.    ? Ubrogepant (UBRELVY) 100 MG TABS TAKE 1 TABLET MOUTH AS NEEDED AS CLOSE TO MIGRAINE ONSET AS POSSIBLE. MAY REPEAT IN 2 HOURS IF NEEDED. MAX 200 MG PER 24 HOURS. (Patient taking differently: Take 100 mg by mouth as directed. Take 1 tablet as close to migraine onset as possible. May repeat in 2 hours if needed, max dose 200 mg /daily.) 16 tablet 11  ? vitamin B-12 (CYANOCOBALAMIN) 500 MCG tablet Take 500 mcg by mouth daily.    ? warfarin (COUMADIN) 5 MG tablet TAKE 1/2 TO 1 TABLET BY MOUTH ONCE DAILY AS DIRECTED BY ANTICOAGULATION CLINIC 30 tablet 2  ? ?No current facility-administered medications for this visit.  ? ? ?Allergies:   Tetracycline, Triptans, Effexor [venlafaxine hcl], Prednisone, and Sulfamethoxazole-trimethoprim   ? ? ?Social History:  The patient  reports that he quit smoking about 8 years ago. His smoking use included cigarettes. He has a 60.00 pack-year smoking history. He has never used smokeless tobacco. He reports that he d

## 2021-07-24 ENCOUNTER — Encounter: Payer: Self-pay | Admitting: Interventional Cardiology

## 2021-07-24 ENCOUNTER — Ambulatory Visit: Payer: Medicare Other | Admitting: Interventional Cardiology

## 2021-07-24 VITALS — BP 90/58 | HR 59 | Ht 66.0 in | Wt 186.0 lb

## 2021-07-24 DIAGNOSIS — Z955 Presence of coronary angioplasty implant and graft: Secondary | ICD-10-CM

## 2021-07-24 DIAGNOSIS — I5022 Chronic systolic (congestive) heart failure: Secondary | ICD-10-CM | POA: Diagnosis not present

## 2021-07-24 DIAGNOSIS — E785 Hyperlipidemia, unspecified: Secondary | ICD-10-CM

## 2021-07-24 DIAGNOSIS — I252 Old myocardial infarction: Secondary | ICD-10-CM

## 2021-07-24 DIAGNOSIS — I25118 Atherosclerotic heart disease of native coronary artery with other forms of angina pectoris: Secondary | ICD-10-CM | POA: Diagnosis not present

## 2021-07-24 DIAGNOSIS — I513 Intracardiac thrombosis, not elsewhere classified: Secondary | ICD-10-CM

## 2021-07-24 NOTE — Patient Instructions (Signed)
Medication Instructions:  ?Your physician recommends that you continue on your current medications as directed. Please refer to the Current Medication list given to you today. ? ?*If you need a refill on your cardiac medications before your next appointment, please call your pharmacy* ? ? ?Lab Work: ?none ?If you have labs (blood work) drawn today and your tests are completely normal, you will receive your results only by: ?MyChart Message (if you have MyChart) OR ?A paper copy in the mail ?If you have any lab test that is abnormal or we need to change your treatment, we will call you to review the results. ? ? ?Testing/Procedures: ?Your physician has requested that you have an echocardiogram. Echocardiography is a painless test that uses sound waves to create images of your heart. It provides your doctor with information about the size and shape of your heart and how well your heart?s chambers and valves are working. This procedure takes approximately one hour. There are no restrictions for this procedure. ? ? ? ?Follow-Up: ?At [facility], you and your health needs are our priority.  As part of our continuing mission to provide you with exceptional heart care, we have created designated Provider Care Teams.  These Care Teams include your primary Cardiologist (physician) and Advanced Practice Providers (APPs -  Physician Assistants and Nurse Practitioners) who all work together to provide you with the care you need, when you need it. ? ?We recommend signing up for the patient portal called "MyChart".  Sign up information is provided on this After Visit Summary.  MyChart is used to connect with patients for Virtual Visits (Telemedicine).  Patients are able to view lab/test results, encounter notes, upcoming appointments, etc.  Non-urgent messages can be sent to your provider as well.   ?To learn more about what you can do with MyChart, go to [email or web address].   ? ?Your next appointment:   ?6  month(s) ? ?The format for your next appointment:   ?In Person ? ?Provider:   ?Larae Grooms, MD   ? ? ?Other Instructions ?  ? ?

## 2021-07-31 ENCOUNTER — Ambulatory Visit: Payer: Medicare Other

## 2021-07-31 DIAGNOSIS — Z5181 Encounter for therapeutic drug level monitoring: Secondary | ICD-10-CM

## 2021-07-31 DIAGNOSIS — I513 Intracardiac thrombosis, not elsewhere classified: Secondary | ICD-10-CM

## 2021-07-31 LAB — POCT INR: INR: 2.6 (ref 2.0–3.0)

## 2021-07-31 NOTE — Patient Instructions (Signed)
Continue on same dosage 1/2 tablet daily except 1 tablet on Tuesdays, Thursdays and Saturdays. Continue eating leafy veggies daily as normal. Recheck INR in 6 weeks. Anticoagulation Clinic [phone] Main [phone] ?

## 2021-08-04 ENCOUNTER — Ambulatory Visit (HOSPITAL_COMMUNITY): Payer: Medicare Other | Attending: Interventional Cardiology

## 2021-08-04 DIAGNOSIS — I513 Intracardiac thrombosis, not elsewhere classified: Secondary | ICD-10-CM | POA: Diagnosis not present

## 2021-08-04 LAB — ECHOCARDIOGRAM COMPLETE
Area-P 1/2: 3.24 cm2
S' Lateral: 2.9 cm

## 2021-08-04 MED ORDER — PERFLUTREN LIPID MICROSPHERE
2.0000 mL | INTRAVENOUS | Status: AC | PRN
  Administered 2021-08-04: 2 mL via INTRAVENOUS

## 2021-08-20 ENCOUNTER — Emergency Department (HOSPITAL_COMMUNITY): Payer: Medicare Other

## 2021-08-20 ENCOUNTER — Emergency Department (HOSPITAL_COMMUNITY)
Admission: EM | Admit: 2021-08-20 | Discharge: 2021-08-21 | Disposition: A | Discharge status: Home / Self Care | Payer: Medicare Other | Attending: Emergency Medicine | Admitting: Emergency Medicine

## 2021-08-20 ENCOUNTER — Encounter (HOSPITAL_COMMUNITY): Payer: Self-pay | Admitting: Emergency Medicine

## 2021-08-20 ENCOUNTER — Other Ambulatory Visit: Payer: Self-pay

## 2021-08-20 DIAGNOSIS — Z7902 Long term (current) use of antithrombotics/antiplatelets: Secondary | ICD-10-CM | POA: Diagnosis not present

## 2021-08-20 DIAGNOSIS — G43009 Migraine without aura, not intractable, without status migrainosus: Secondary | ICD-10-CM | POA: Insufficient documentation

## 2021-08-20 DIAGNOSIS — R519 Headache, unspecified: Secondary | ICD-10-CM | POA: Diagnosis present

## 2021-08-20 DIAGNOSIS — I2510 Atherosclerotic heart disease of native coronary artery without angina pectoris: Secondary | ICD-10-CM | POA: Insufficient documentation

## 2021-08-20 DIAGNOSIS — R001 Bradycardia, unspecified: Secondary | ICD-10-CM | POA: Diagnosis not present

## 2021-08-20 DIAGNOSIS — R4182 Altered mental status, unspecified: Secondary | ICD-10-CM | POA: Insufficient documentation

## 2021-08-20 DIAGNOSIS — Z7901 Long term (current) use of anticoagulants: Secondary | ICD-10-CM | POA: Diagnosis not present

## 2021-08-20 DIAGNOSIS — I10 Essential (primary) hypertension: Secondary | ICD-10-CM | POA: Insufficient documentation

## 2021-08-20 DIAGNOSIS — R0789 Other chest pain: Secondary | ICD-10-CM | POA: Diagnosis not present

## 2021-08-20 LAB — DIFFERENTIAL
Abs Immature Granulocytes: 0.02 10*3/uL (ref 0.00–0.07)
Basophils Absolute: 0.1 10*3/uL (ref 0.0–0.1)
Basophils Relative: 1 %
Eosinophils Absolute: 0.2 10*3/uL (ref 0.0–0.5)
Eosinophils Relative: 2 %
Immature Granulocytes: 0 %
Lymphocytes Relative: 24 %
Lymphs Abs: 2 10*3/uL (ref 0.7–4.0)
Monocytes Absolute: 1 10*3/uL (ref 0.1–1.0)
Monocytes Relative: 12 %
Neutro Abs: 4.9 10*3/uL (ref 1.7–7.7)
Neutrophils Relative %: 61 %

## 2021-08-20 LAB — COMPREHENSIVE METABOLIC PANEL
ALT: 36 U/L (ref 0–44)
AST: 31 U/L (ref 15–41)
Albumin: 3.7 g/dL (ref 3.5–5.0)
Alkaline Phosphatase: 75 U/L (ref 38–126)
Anion gap: 11 (ref 5–15)
BUN: 13 mg/dL (ref 8–23)
CO2: 28 mmol/L (ref 22–32)
Calcium: 9.4 mg/dL (ref 8.9–10.3)
Chloride: 100 mmol/L (ref 98–111)
Creatinine, Ser: 1.05 mg/dL (ref 0.61–1.24)
GFR, Estimated: >60 mL/min (ref >60)
Glucose, Bld: 91 mg/dL (ref 70–99)
Potassium: 4.4 mmol/L (ref 3.5–5.1)
Sodium: 139 mmol/L (ref 135–145)
Total Bilirubin: 0.5 mg/dL (ref 0.3–1.2)
Total Protein: 7.3 g/dL (ref 6.5–8.1)

## 2021-08-20 LAB — I-STAT CHEM 8, ED
BUN: 16 mg/dL (ref 8–23)
Calcium, Ion: 1.21 mmol/L (ref 1.15–1.40)
Chloride: 101 mmol/L (ref 98–111)
Creatinine, Ser: 1 mg/dL (ref 0.61–1.24)
Glucose, Bld: 87 mg/dL (ref 70–99)
HCT: 44 % (ref 39.0–52.0)
Hemoglobin: 15 g/dL (ref 13.0–17.0)
Potassium: 4.3 mmol/L (ref 3.5–5.1)
Sodium: 139 mmol/L (ref 135–145)
TCO2: 30 mmol/L (ref 22–32)

## 2021-08-20 LAB — CBC
HCT: 42.8 % (ref 39.0–52.0)
Hemoglobin: 14.1 g/dL (ref 13.0–17.0)
MCH: 30.9 pg (ref 26.0–34.0)
MCHC: 32.9 g/dL (ref 30.0–36.0)
MCV: 93.7 fL (ref 80.0–100.0)
Platelets: 239 10*3/uL (ref 150–400)
RBC: 4.57 MIL/uL (ref 4.22–5.81)
RDW: 13.4 % (ref 11.5–15.5)
WBC: 8.1 10*3/uL (ref 4.0–10.5)
nRBC: 0 % (ref 0.0–0.2)

## 2021-08-20 LAB — PROTIME-INR
INR: 2.1 — ABNORMAL HIGH (ref 0.8–1.2)
Prothrombin Time: 23.6 seconds — ABNORMAL HIGH (ref 11.4–15.2)

## 2021-08-20 LAB — APTT: aPTT: 40 seconds — ABNORMAL HIGH (ref 24–36)

## 2021-08-20 MED ORDER — DEXAMETHASONE SODIUM PHOSPHATE 10 MG/ML IJ SOLN: 10.0000 mg | Freq: Once | INTRAMUSCULAR | Status: DC

## 2021-08-20 MED ORDER — LACTATED RINGERS IV BOLUS: 1000.0000 mL | Freq: Once | INTRAVENOUS | Status: DC

## 2021-08-20 MED ORDER — PROCHLORPERAZINE EDISYLATE 10 MG/2ML IJ SOLN: 10.0000 mg | Freq: Once | INTRAMUSCULAR | Status: DC

## 2021-08-20 MED ORDER — SODIUM CHLORIDE 0.9% FLUSH: 3.0000 mL | Freq: Once | INTRAVENOUS | Status: DC

## 2021-08-20 NOTE — ED Notes (Signed)
Patient transported to CT 

## 2021-08-20 NOTE — Discharge Instructions (Signed)
Please follow-up with your neurologist regarding management of your migraines. ? ?Return to the emergency department if you have any new or concerning symptoms, or if headache gets worse and you wish to get medication to try to abort it. ?

## 2021-08-20 NOTE — ED Provider Notes (Signed)
[facility] EMERGENCY DEPARTMENT Provider Note   CSN: [id number] Arrival date & time: 08/20/21  1613     History  Chief Complaint  Patient presents with   Gait Problem   Altered Mental Status    Kyle Walters is a 66 y.o. male.  The history is provided by the patient.  Altered Mental Status He has history of hypertension, hyperlipidemia, pulmonary emphysema, migraine headaches, coronary artery disease, cardiac thrombus anticoagulated on warfarin and comes in because of confusion and concerned that he might be having a stroke.  He has chronic migraines and states he has a headache which started yesterday.  Headaches are always right-sided and throbbing in nature.  There is associated photophobia but no phonophobia.  He denies any visual change.  There has been nausea but no vomiting.  Today, he noted that he was getting confused and was forgetting some things.  Denies any weakness or numbness.  He was concerned that he might be having a stroke so he came to the emergency department.   Home Medications Prior to Admission medications   Medication Sig Start Date End Date Taking? Authorizing Provider  acetaminophen (TYLENOL) 500 MG tablet Take 1,000 mg by mouth every 6 (six) hours as needed for mild pain.    [provider]  ALPRAZolam Prudy Feeler) 1 MG tablet Take 1 mg by mouth 4 (four) times daily.     [provider]  B Complex-Biotin-FA (B-50 COMPLEX PO) Take 1 capsule by mouth daily.    [provider]  Botulinum Toxin Type A (BOTOX) 200 units SOLR Provider to inject 155 units into the muscles of the head and neck every 3 months. Discard remainder. 06/29/21   Anson Fret, MD  BREO ELLIPTA 100-25 MCG/INH AEPB INHALE 1 PUFF BY MOUTH EVERY DAY Patient taking differently: Inhale 1 puff into the lungs daily. 11/07/20   Leslye Peer, MD  Cholecalciferol (VITAMIN D3) 2000 UNITS TABS Take 4,000 Units by mouth daily after breakfast.    [provider]  clopidogrel (PLAVIX) 75 MG tablet Take 1 tablet (75 mg total) by mouth daily with breakfast. 06/07/21   Corky Crafts, MD  doxazosin (CARDURA) 8 MG tablet Take 8 mg by mouth daily.    [provider]  escitalopram (LEXAPRO) 20 MG tablet Take 10 mg by mouth in the morning. 10/26/14   [provider]  Ferrous Sulfate (IRON PO) Take 65 mg by mouth daily after breakfast. Hema Plex Iron    [provider]  gabapentin (NEURONTIN) 400 MG capsule Take 400 mg by mouth 4 (four) times daily.     [provider]  MAGNESIUM MALATE PO Take 400 mg by mouth daily after breakfast.    [provider]  memantine (NAMENDA) 10 MG tablet Take 20 mg in the morning and 10 mg in the late afternoon Patient taking differently: Take 10-20 mg by mouth See admin instructions. Take 20 mg in the morning and 10 mg by mouth with supper 02/27/21   Dohmeier, Porfirio Mylar, MD  Multiple Vitamin (MULTIVITAMIN WITH MINERALS) TABS tablet Take 1 tablet by mouth daily after breakfast. Centrum Silver for Men    [provider]  Nerve Stimulator (NERIVIO) DEVI USE WITHIN 1 HR OF MIGRAINE. SET A STRONG YET COMFORTABLE INTENSITY LEVEL & MAINTAIN LEVEL FOR 45 MINS 04/20/21   Anson Fret, MD  nicotine (NICODERM CQ - DOSED IN MG/24 HOURS) 14 mg/24hr patch Place 14 mg onto the skin daily.  [provider]  nitroGLYCERIN (NITROSTAT) 0.4 MG SL tablet Place 1 tablet (0.4 mg total) under the tongue every 5 (five) minutes as needed. Patient taking differently: Place 0.4 mg under the tongue every 5 (five) minutes as needed for chest pain. 03/14/21   Arty Baumgartner, NP  Omega-3 Fatty Acids (FISH OIL) 1200 MG CAPS Take 1,200 mg by mouth daily after breakfast.    [provider]  pantoprazole (PROTONIX) 20 MG tablet Take 1 tablet (20 mg total) by mouth daily. 03/20/21   Corky Crafts, MD  phenylephrine-shark liver oil-mineral oil-petrolatum (PREPARATION H)  0.25-14-74.9 % rectal ointment Place 1 application rectally 2 (two) times daily as needed for hemorrhoids.    [provider]  rosuvastatin (CRESTOR) 20 MG tablet Take 1 tablet (20 mg total) by mouth daily. 01/27/21   Corky Crafts, MD  silodosin (RAPAFLO) 8 MG CAPS capsule Take 8 mg by mouth daily with breakfast.    [provider]  Ubrogepant (UBRELVY) 100 MG TABS TAKE 1 TABLET MOUTH AS NEEDED AS CLOSE TO MIGRAINE ONSET AS POSSIBLE. MAY REPEAT IN 2 HOURS IF NEEDED. MAX 200 MG PER 24 HOURS. Patient taking differently: Take 100 mg by mouth as directed. Take 1 tablet as close to migraine onset as possible. May repeat in 2 hours if needed, max dose 200 mg /daily. 06/01/21   Anson Fret, MD  vitamin B-12 (CYANOCOBALAMIN) 500 MCG tablet Take 500 mcg by mouth daily.    [provider]  warfarin (COUMADIN) 5 MG tablet TAKE 1/2 TO 1 TABLET BY MOUTH ONCE DAILY AS DIRECTED BY ANTICOAGULATION CLINIC 07/17/21   Corky Crafts, MD      Allergies    Tetracycline, Triptans, Effexor [venlafaxine hcl], Prednisone, and Sulfamethoxazole-trimethoprim    Review of Systems   Review of Systems  All other systems reviewed and are negative.  Physical Exam Updated Vital Signs BP 126/71 (BP Location: Right Arm)   Pulse (!) 51   Temp 97.7 F (36.5 C) (Oral)   Resp 17   Ht 5\' 6"  (1.676 m)   Wt 81.6 kg   SpO2 96%   BMI 29.05 kg/m  Physical Exam Vitals and nursing note reviewed.  66 year old male, resting comfortably and in no acute distress. Vital signs are normal. Oxygen saturation is 96%, which is normal. Head is normocephalic and atraumatic. PERRLA, EOMI. Oropharynx is clear. Neck is nontender and supple without adenopathy or JVD. Back is nontender and there is no CVA tenderness. Lungs are clear without rales, wheezes, or rhonchi. Chest is nontender. Heart has regular rate and rhythm without murmur. Abdomen is soft, flat, nontender without masses or  hepatosplenomegaly and peristalsis is normoactive. Extremities have no cyanosis or edema, full range of motion is present. Skin is warm and dry without rash. Neurologic: Mental status is normal, cranial nerves are intact.  Strength is 5/5 in all 4 extremities.  There is no pronator drift.  There is no extinction on double simultaneous stimulation.  ED Results / Procedures / Treatments   Labs (all labs ordered are listed, but only abnormal results are displayed) Labs Reviewed  PROTIME-INR - Abnormal; Notable for the following components:      Result Value   Prothrombin Time 23.6 (*)    INR 2.1 (*)    All other components within normal limits  APTT - Abnormal; Notable for the following components:   aPTT 40 (*)    All other components within normal limits  CBC  DIFFERENTIAL  COMPREHENSIVE METABOLIC PANEL  I-STAT CHEM 8, ED  CBG MONITORING, ED    EKG EKG Interpretation  Date/Time:  Sunday August 20 2021 16:39:47 EDT Ventricular Rate:  56 PR Interval:  150 QRS Duration: 82 QT Interval:  422 QTC Calculation: 407 R Axis:   108 Text Interpretation: Sinus bradycardia Possible Anterolateral infarct , age undetermined Abnormal ECG When compared with ECG of 16-Jul-2021 10:14, No significant change was found Confirmed by Dione Booze ([id number]) on 08/20/2021 10:36:05 PM   EKG Interpretation  Date/Time:  Sunday August 20 2021 23:20:37 EDT Ventricular Rate:  52 PR Interval:  173 QRS Duration: 104 QT Interval:  445 QTC Calculation: 414 R Axis:   118 Text Interpretation: Sinus rhythm Right axis deviation Low voltage, precordial leads Probable anteroseptal infarct, old Minimal ST elevation, lateral leads When compared with ECG of EARLIER SAME DATE No significant change was found Confirmed by Dione Booze ([id number]) on 08/20/2021 11:34:56 PM        Radiology CT HEAD WO CONTRAST  Result Date: 08/20/2021 CLINICAL DATA:  Altered mental status. EXAM: CT HEAD WITHOUT CONTRAST TECHNIQUE:  Contiguous axial images were obtained from the base of the skull through the vertex without intravenous contrast. RADIATION DOSE REDUCTION: This exam was performed according to the departmental dose-optimization program which includes automated exposure control, adjustment of the mA and/or kV according to patient size and/or use of iterative reconstruction technique. COMPARISON:  June 11, 2020 FINDINGS: Brain: No evidence of acute infarction, hemorrhage, hydrocephalus, extra-axial collection or mass lesion/mass effect. Vascular: No hyperdense vessel or unexpected calcification. Skull: Normal. Negative for fracture or focal lesion. Sinuses/Orbits: No acute finding. Other: None. IMPRESSION: No acute intracranial process. Electronically Signed   By: Aram Candela M.D.   On: 08/20/2021 18:22    Procedures Procedures    Medications Ordered in ED Medications - No data to display  ED Course/ Medical Decision Making/ A&P                           Medical Decision Making Amount and/or Complexity of Data Reviewed Labs: ordered. Radiology: ordered.   Migraine headache with confusion which has resolved.  Initial evaluation at triage indicated no code stroke activation.  Work-up in the ED has included CT of the head which shows no acute findings.  I independently viewed the images, and agree with radiologist's interpretation.  CBC and metabolic panel are normal.  INR is therapeutic.  ECG is obtained showing no acute changes.  Repeat ECG is unchanged.  Patient states that he is still having a headache and was offered a migraine cocktail but he has declined.  He states that all of the antiemetics which are used in migraine cocktails cause dysphoric reactions and he cannot take diphenhydramine because of BPH and he cannot take steroids because of them inducing insomnia.  He states that his headache is at an adequate level and he is comfortable going home, he was just concerned to make sure he was not  having a stroke.  Old records are reviewed, and he does have numerous ED visits for headaches.  He is discharged but advised to return if symptoms are worsening.  Otherwise, follow-up with his neurologist.   Final Clinical Impression(s) / ED Diagnoses Final diagnoses:  Migraine without aura and without status migrainosus, not intractable  Anticoagulated on warfarin    Rx / DC Orders ED Discharge Orders     None  Dione Booze, MD 08/20/21 2342

## 2021-08-20 NOTE — ED Triage Notes (Addendum)
Pt reports migraines over the past few days.  States he didn't have a headache when he woke up today but it started after walking for 5 min (around 11:30am).  Reports unsteady gait and confusion since 2:30pm today.  No arm drift.  No leg drift.  No facial droop. Speech clear.  Pt alert and oriented.  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  ? ?Notified Dr. Dina Rich and she is coming to triage to assess pt regarding Code Stroke activation. ?

## 2021-08-20 NOTE — ED Provider Notes (Signed)
Was called to bedside for evaluation of dizziness, concern for possible code stroke.  66 year old male with medical history of migraines presents with migrainous like headache and lightheadedness.  Patient states he developed a headache earlier today.  Took a nap before 1 PM with a mild generalized headache.  Woke up around 230 and felt lightheaded.  He feels unsteady when he walks but no focal weakness.  He felt confused at first but now just feels like he is in a fog.  Denies any facial droop, speech change, vision change, unilateral weakness/numbness.  States that he has had lightheadedness/confusion secondary to migraines before but has never been this severe pronounced. ? ?On my neuro exam he is neuro intact. No LVO. Speech at times is slow but is articulate and oriented.  No focal neurodeficit.  LKN "before 1 pm" without exact time, evaluated patient at 446, would be outside of TPA window. Suspect that this could be related to migraine/complicated migraine.  We will plan to evaluate for migraine and rule out stroke but given timeframe and current presentation no code stroke placed. ?  Lorelle Gibbs, DO ?08/20/21 1655 ? ?

## 2021-08-23 ENCOUNTER — Encounter: Payer: Self-pay | Admitting: Interventional Cardiology

## 2021-09-06 ENCOUNTER — Other Ambulatory Visit: Payer: Self-pay | Admitting: Cardiology

## 2021-09-07 ENCOUNTER — Ambulatory Visit: Payer: Medicare Other | Admitting: Emergency Medicine

## 2021-09-07 ENCOUNTER — Encounter: Payer: Self-pay | Admitting: Emergency Medicine

## 2021-09-07 DIAGNOSIS — J449 Chronic obstructive pulmonary disease, unspecified: Secondary | ICD-10-CM

## 2021-09-07 DIAGNOSIS — Z87891 Personal history of nicotine dependence: Secondary | ICD-10-CM

## 2021-09-07 MED ORDER — ALBUTEROL SULFATE HFA 108 (90 BASE) MCG/ACT IN AERS: 2.0000 | INHALATION_SPRAY | Freq: Four times a day (QID) | RESPIRATORY_TRACT | 2 refills | Status: DC | PRN

## 2021-09-07 NOTE — Progress Notes (Signed)
  Subjective:    Patient ID: Kyle Walters, male    DOB: Feb 27, 1956, 66 y.o.   MRN: [id number]  HPI  ROV 07/26/20 --pleasant 66 year old gentleman with a history of asthmatic COPD, superimposed restriction.  He has depression, history of trauma and postconcussion syndrome.  He returns today for regular follow-up.  Today he reports that he is overall doing well. He has gained wt since last time, over 20 lbs.  Remains on Breo, has albuterol that he uses almost never. Remains active, can do most of his usual activities. No cough. COVID shots up to date.    ROV 09/07/21 --66 year old man with a history of tobacco use (60 pack years) whom I have followed for combined obstructive and restrictive disease consistent with asthmatic COPD.  He also has a history of depression and anxiety, postconcussion syndrome from a trauma.  He participates in lung cancer screening program.  His most recent CT from 09/2020 showed a possible left ventricular outpouching at the apex raising possibility of prior MI or LV aneurysm.  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 Patient is on Breo but he uses as needed - about 5 days a week because he has associated tremor.  He is on pantoprazole. He does not use albuterol, need sot be refilled. Scheduled for LDCT 09/2021.  He has been active, stationary bike. No cough.      Objective:   Physical Exam Vitals:   09/07/21 1601  BP: 138/74  Pulse: (!) 55  Temp: 98.3 F (36.8 C)  TempSrc: Oral  SpO2: 96%  Weight: 180 lb 12.8 oz (82 kg)  Height: '5\' 6"'$  (1.676 m)     Gen: Pleasant, well-nourished, in no distress,  normal affect  ENT: No lesions,  mouth clear,  oropharynx clear, no postnasal drip  Neck: No JVD, no stridor  Lungs: No use of accessory muscles, clear without rales or rhonchi  Cardiovascular: RRR, heart sounds normal, no murmur or gallops, no peripheral edema  Musculoskeletal: No  deformities, no cyanosis or clubbing  Neuro: awake, appropriate, non-focal  Skin: Warm, no lesions or rashes     Assessment & Plan:  COPD (chronic obstructive pulmonary disease) He is using his Breo most days but cannot take it every day continuously because he develops tremor.  He averages about 5 days a week.  His breathing appears to be well controlled.  He never needs albuterol.  Minimal symptoms.  We will continue the same regimen.  I will refill his albuterol.  History of tobacco use Still qualifies for lung cancer screening.  His next scan is in 09/2021.  Baltazar Apo, MD, PhD [facility] [phone] or if no answer [phone]

## 2021-09-07 NOTE — Assessment & Plan Note (Signed)
He is using his Breo most days but cannot take it every day continuously because he develops tremor.  He averages about 5 days a week.  His breathing appears to be well controlled.  He never needs albuterol.  Minimal symptoms.  We will continue the same regimen.  I will refill his albuterol.

## 2021-09-07 NOTE — Patient Instructions (Addendum)
Please continue your Memory Dance as you have been taking.  Rinse and gargle after using. We will refill an albuterol inhaler so he can have it available in case you develop shortness of breath, chest tightness, wheezing. Your lung cancer screening CT scan is scheduled for June 2023.  We will call you with results. We will give you Prevnar 20 pneumococcal vaccine today. Follow Dr. Lamonte Sakai in 1 year or sooner if you have any problems.

## 2021-09-07 NOTE — Assessment & Plan Note (Signed)
Still qualifies for lung cancer screening.  His next scan is in 09/2021.

## 2021-09-11 ENCOUNTER — Ambulatory Visit (INDEPENDENT_AMBULATORY_CARE_PROVIDER_SITE_OTHER): Payer: Medicare Other | Admitting: Pharmacist

## 2021-09-11 DIAGNOSIS — Z5181 Encounter for therapeutic drug level monitoring: Secondary | ICD-10-CM

## 2021-09-11 DIAGNOSIS — I513 Intracardiac thrombosis, not elsewhere classified: Secondary | ICD-10-CM | POA: Diagnosis not present

## 2021-09-11 LAB — POCT INR: INR: 2.1 (ref 2.0–3.0)

## 2021-09-11 NOTE — Patient Instructions (Signed)
Description   DO NOT HIGHLIGHT PAPERS & GIVE PRINTED PAPERWORK TO WIFE-THESE THINGS TRIGGER MIGRAINES. Continue on same dosage 1/2 tablet daily except 1 tablet on Tuesdays, Thursdays and Saturdays. Continue eating leafy veggies daily as normal. Recheck INR in 6 weeks at the [facility] office. Anticoagulation Clinic [phone]

## 2021-09-26 ENCOUNTER — Ambulatory Visit: Payer: Medicare Other

## 2021-09-26 ENCOUNTER — Ambulatory Visit (INDEPENDENT_AMBULATORY_CARE_PROVIDER_SITE_OTHER)
Admission: RE | Admit: 2021-09-26 | Discharge: 2021-09-26 | Disposition: A | Discharge status: Home / Self Care | Payer: Medicare Other | Source: Ambulatory Visit | Attending: Acute Care | Admitting: Acute Care

## 2021-09-26 DIAGNOSIS — Z87891 Personal history of nicotine dependence: Secondary | ICD-10-CM | POA: Diagnosis not present

## 2021-09-28 ENCOUNTER — Other Ambulatory Visit: Payer: Self-pay | Admitting: Acute Care

## 2021-09-28 DIAGNOSIS — Z87891 Personal history of nicotine dependence: Secondary | ICD-10-CM

## 2021-09-28 DIAGNOSIS — Z122 Encounter for screening for malignant neoplasm of respiratory organs: Secondary | ICD-10-CM

## 2021-10-09 NOTE — Telephone Encounter (Signed)
Received approval from [facility], [place] # [id number] (10/09/2021-10/10/2022).

## 2021-10-10 ENCOUNTER — Ambulatory Visit: Payer: Medicare Other | Admitting: Adult Health

## 2021-10-10 ENCOUNTER — Encounter: Payer: Self-pay | Admitting: Adult Health

## 2021-10-10 VITALS — BP 115/70 | HR 56 | Ht 66.0 in | Wt 180.0 lb

## 2021-10-10 DIAGNOSIS — G43711 Chronic migraine without aura, intractable, with status migrainosus: Secondary | ICD-10-CM

## 2021-10-10 NOTE — Telephone Encounter (Signed)
Received (1) 200 unit vial of Botox from [facility]. ?

## 2021-10-10 NOTE — Progress Notes (Signed)
    10/10/2021:  Had more headaches in may. Otherwise doing ok.   BOTOX PROCEDURE NOTE FOR MIGRAINE HEADACHE    Contraindications and precautions discussed with patient(above). Aseptic procedure was observed and patient tolerated procedure. Procedure performed by Ward Givens, NP  The condition has existed for more than 6 months, and pt does not have a diagnosis of ALS, Myasthenia Gravis or Lambert-Eaton Syndrome.  Risks and benefits of injections discussed and pt agrees to proceed with the procedure.  Written consent obtained  These injections are medically necessary.  These injections do not cause sedations or hallucinations which the oral therapies may cause.  Indication/Diagnosis: chronic migraine BOTOX(J0585) injection was performed according to protocol by [employer]. 200 units of BOTOX was dissolved into 4 cc NS.   NDC: [address]-35  Type of toxin: Botox Botox 200 units x 1 vial LOT #: [id number] EXP: 2026/02 NDC: 4008-6761-95   Bacteriostatic 0.9% Sodium Chloride- 50m total Lot: [id number]: 11/22/2022 NDC: [address]-83   Description of procedure:  The patient was placed in a sitting position. The standard protocol was used for Botox as follows, with 5 units of Botox injected at each site:   -Procerus muscle, midline injection  -Corrugator muscle, bilateral injection  -Frontalis muscle, bilateral injection, with 2 sites each side, medial injection was performed in the upper one third of the frontalis muscle, in the region vertical from the medial inferior edge of the superior orbital rim. The lateral injection was again in the upper one third of the forehead vertically above the lateral limbus of the cornea, 1.5 cm lateral to the medial injection site.  -Temporalis muscle injection, 4 sites, bilaterally. The first injection was 3 cm above the tragus of the ear, second injection site was 1.5 cm to 3 cm up from the first injection site in line with the tragus of  the ear. The third injection site was 1.5-3 cm forward between the first 2 injection sites. The fourth injection site was 1.5 cm posterior to the second injection site.  -Occipitalis muscle injection, 3 sites, bilaterally. The first injection was done one half way between the occipital protuberance and the tip of the mastoid process behind the ear. The second injection site was done lateral and superior to the first, 1 fingerbreadth from the first injection. The third injection site was 1 fingerbreadth superiorly and medially from the first injection site.  -Cervical paraspinal muscle injection, 2 sites, bilateral knee first injection site was 1 cm from the midline of the cervical spine, 3 cm inferior to the lower border of the occipital protuberance. The second injection site was 1.5 cm superiorly and laterally to the first injection site.  -Trapezius muscle injection was performed at 3 sites, bilaterally. The first injection site was in the upper trapezius muscle halfway between the inflection point of the neck, and the acromion. The second injection site was one half way between the acromion and the first injection site. The third injection was done between the first injection site and the inflection point of the neck.   Will return for repeat injection in 3 months.   A 200 units of Botox was used, 155 units were injected, the rest of the Botox was wasted. The patient tolerated the procedure well, there were no complications of the above procedure.  MWard Givens MSN, NP-C 10/10/2021, 2:42 PM [facility] [address] [place] [phone]

## 2021-10-10 NOTE — Progress Notes (Signed)
Botox 200 units x 1 vial LOT #: [id number] EXP: 2026/02 NDC: 5015-8682-57  SP

## 2021-10-18 ENCOUNTER — Ambulatory Visit: Payer: Self-pay | Admitting: Neurology

## 2021-10-18 ENCOUNTER — Encounter: Payer: Self-pay | Admitting: Neurology

## 2021-10-18 VITALS — BP 99/60 | HR 64 | Ht 66.0 in | Wt 179.2 lb

## 2021-10-18 DIAGNOSIS — G43711 Chronic migraine without aura, intractable, with status migrainosus: Secondary | ICD-10-CM

## 2021-10-18 DIAGNOSIS — G43109 Migraine with aura, not intractable, without status migrainosus: Secondary | ICD-10-CM

## 2021-10-18 DIAGNOSIS — Z0289 Encounter for other administrative examinations: Secondary | ICD-10-CM

## 2021-10-18 NOTE — Progress Notes (Signed)
[facility]    Provider:  Dr Jaynee Eagles Requesting Provider: Elwyn Reach, MD Primary Care Provider:  Elwyn Reach, MD  CC:  migraines  10/18/2021: On [place]. Also on botox. Vyepti was canceled. Also on memantine, nerivio,botox, gabapentin, magnesium, lexapro. Medications tried in the past include Emgality(worked great then stopped working, went up to 300 didn't help), Ajovy(Didn't work as well as others), Aimovig, Depakote, gabapentin, Topamax, memantine, Effexor, nortriptyline/TCAs, beta-blockers are contraindicated due to COPD, sumatriptan, Nurtec, aspirin, Lexapro, ketorolac injections, Mobic, Reglan injections, rizatriptan, sumatriptan, zonisamide, vyepti, qulipta, nurtec, ubrelvy and many others over the last 10 years, TMS therapy, Nerivio, cognitive behavioral therapy. Has not tried candesartan but his blood pressure is on the low side and usually this is cost prohibitive or we can't get it approved, been to therapy and psychiatry. Has not tried verapamil but again is blood pressure is on the lower side. He tried Cephaly. VNS was cost prohibitive.Memantine,nerivio,botox,vyepti,magnesium,lexapro, nurtec, somatic tracking and the curable app.  Had Chronic daily headaches and migraines, now 9 a month. He has tried many medications over the years for migraine, concomitant psychiatric hx and PTSD. I have explained I may not be able to help him anymore, we have tried, and he may need to go to an academic center or [facility] neurology for another opinion.  When patient comes, I usually allocate extra time for him in our because he talks a lot about his social stressors, stress at home, things that are going on in his life that may be affecting his migraines.   Today we again talked with him that he may have to get another pair of eyes on him, we can continue providing his Botox. Patient decided he did not want to see anyone else but not sure what else we can do for  him.  Vyepti was expensive, when he stopped it there was no change in his migraines. He thinks the qulipta is helping some. The Ubrelvy helps. Nurtec helped for 2 hours but the migraine always rebounded. He was down to 6 migraines a month, in May his wife stopped teaching and his migraines increased due to anger and stress but improved when she started volunteering. He had 9 migraines this month, last about a day or less,nerivio helps, Roselyn Meier helps, generally able to work that evening so maybe about 12 hours he feels better, somatic tracking and the curable app.the last time he threw up was in 2004. No significant nausea. He does get decreased concentration and imbalance with his migraines which are more debilitating than the pain. Discussed timolol eye drops or propranolol '10mg'$  acutely he will ask his pulmonologist. Zavegepant is also new and coming out.  Patient complains of symptoms per HPI as well as the following symptoms: stress . Pertinent negatives and positives per HPI. All others negative    06/01/2021:  - Medications tried in the past include Emgality(worked great then stopped working, went up to 300 didn't help), Ajovy(Didn't work as well as others), Aimovig, Depakote, gabapentin, Topamax, memantine, Effexor, nortriptyline/TCAs, beta-blockers are contraindicated due to COPD, sumatriptan, Nurtec, aspirin, Lexapro, ketorolac injections, Mobic, Reglan injections, rizatriptan, sumatriptan, zonisamide, vyepti, qulipta, nurtec, ubrelvy and many others over the last 10 years, TMS therapy, Nerivio, cognitive behavioral therapy. Has not tried candesartan but his blood pressure is on the low side and usually this is cost prohibitive or we can't get it approved, been to therapy and psychiatry. Has not tried verapamil but again is blood pressure is on the lower  side. He tried Cephaly. VNS was cost prohibitive.   Chronic daily headaches and migraines. He has tried many medications over the years for  migraine, concomitant psychiatric hx and PTSD. I have explained I may not be able to help him anymore, we have tried, and he may need to go to an academic center or [facility] neurology for another opinion.  When patient comes, I usually allocate extra time for him in our because he talks a lot about his social stressors, stress at home, things that are going on in his life that may be affecting his migraines.   Today we again talked with him that he may have to get another pair of eyes on him, we can continue providing his Botox.   Today decided to: Stop Vyepti, [employer]; Continue Ubrelvy, memantine, nerivio,botox, gabapentin, magnesium, lexapro  Patient here alone and reports that This morning as he was ready to have his oatmeal and blueberries, while at the computer, he had gotten the blueberries, he was having a migraine. He mentioned  he didn't know he had migraines until 10 years ago, he started researching neuroplastic pain and somatic pain,was starting to feel optimistic He uses his nerivio, took [place] and took tylenol. Then something happened that upset him and the migraine worsened and he is not sure if he had a new migraines or an exacerbation of th existing migraine. He is still "out of it" and having pain and has a migraine. He bounces back and forth feeling optimistic, "working my way back from despair", no plans to hurt himself or others. He is feeling very trapped in his marriage. They were seeing a marriage therapist. The therapist was not giving them good advice, telling his wife she needs to think of them as a couple, they stopped seeing her and marital therapy is covered by his wife's therapy and having a hard time getting liz to remember to schedule a therapist. He has had migraines triggered by exposure to smells, he continues to have migraines exacerbated by interactions with his wife, they will talk about something and agree on what they are going to do then she doesn't do it and  never tells him. He says it is extremely difficult being married to someone who is autistic. He is disabled and feels very frustrated by it and he is stuck in this situation. His vyepti may not be helping him and is very expensive. Emgality worked for 6 months. He suffers from agoraphobia and panic attacks.   04/11/2021: 3rd botox. He does not clench and used follow the pain protocol and put extra around the crown and right occipitalis of his head if needed. Marland KitchenHe is on [place] and vyepti. He has tried many medications , concomitant psychiatric hx and PTSD. I have explained I may not be able to help him and he may need to go to an academic center or try [facility]. He has been taking atogepant still even when we started vyepti, we had discussed stopping the atogepant. I am not aware of any contraindications to taking all of these but I told him I am not sure how many cgrp medictions a patient should take at once. I recommended we try going off of atogepant but he says he is going through a bad patch of migraines and does not want to. I have contacted the Vyepti rep and asked her MSL to follow up on taking Vyepti with atogepant and ubrelvy. I checked Epocrates and there were no interactions  in the interaction checker but advised patient I am just not sure if he should be taking qulipta with vyepti but since he has had no side effects we can continue while I ask Vyepti MSL for research.   01/10/2021; second Botox. He has chronic migraines without aura and those are doing better on botox. Still daily but less severe . He has episodic migraines with aura will prescribe qulipta. We see his wife [name]. Got the [place] approved. The nerivio helps some. He has been using it when having a new headache and the TMS helped some. Dry needling hurts but helps, he knew a PT who performs dry needling and that has helped some too.    01/23/2021: He returns today. We are performing botox  on him every 12 weeks. We sent him to [facility] therapy. We started qulipta. We also started nerivio. We see his wife [name]. Got the [place]. The nerivio helps some. He has been using it when having a new headache and the TMS helped some. Dry needling hurts but helps, he knew a PT who performs dry needling and that has helped some too. He is having memory problems. He forgets what month they are in. This can go on for months in the setting of headaches. The chronic pain bothers him less than his memory loss which he attributes to chronic pain. He has been on the [place] a few months. He was still having migraines on Qulipta but they were better. 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 The emgality was great then stopped being as great, then went up to 300, now back to 200 and no difference may just have stopped working altogether. Roselyn Meier and Nerivio have worked well.   HPI:  Kyle Walters is a 66 y.o. male here as requested by Elwyn Reach, MD for headaches.  Past medical history panic disorder, COPD, depression, GERD, severe headaches and migraines.  I reviewed Dr. Leontine Locket notes: Headaches improved in the past but now they are back. Went to [facility] and had a bad experience.  Patient has tried Imitrex and and has other medical problems including ADHD and anxiety disorder and chronic allergies to fragrance, he continues to have multiple medical problems including allergies, abdominal discomfort with severe GERD, recurrent nausea, the last time he had a good meaningful relief of his headaches was when he went to the headache clinic at the [facility].  Patient is here alone and reports he has headaches and migraines, has had migraines since his 75s, they became severe about 9 years ago, he is on Botox and takes Emgality cluster headache dose although he denies having cluster headaches.He saw a neurologist at the [facility], then the next neurologist started working for [facility] and was replaced by nurses. He went through a bad migraine cycle in May 30th of last year, he had trigger point injections, they would not do infusions, he cannot take prednisone.He feels he has lasting side effects from prednisone.He was in the ED and he was given reglan and toradol and his migraine cycle stopped. He has BPH so cannot have compazine or benadryl. We spoke about Tardive Dyskinesias and reglan.Migraines worsened 9 years ago.Smells can trigger severe migraines.Hif wife has autism and extreme anxiety. Finger nail polish always starts a severe migraine.He has developed osmophobia and now many smells "set me off". 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 He is going to try peppermint oil to try and find a masking agent to put on his nose.His migraines causeing a lack of memory, in 2018 he went to the [place] [facility].Bright colors also bother him. He has nausea. He still has headaches almost every day, but this is still a significant improvement over prior, in 2018 he had severe daily migraines, now headaches are mild at least 80% improved in severity. He went in last week for reglan and toradol.He struggles with agoraphobia.  Reviewed notes, labs and imaging from outside physicians, which showed:   - Medications tried in the past include Emgality(worked great then stopped working, went up to 300 didn't help), Ajovy(Didn't work as well as others), Aimovig, Depakote, gabapentin, Topamax, memantine, Effexor, nortriptyline/TCAs, beta-blockers are contraindicated due to COPD, sumatriptan, Nurtec, aspirin, Lexapro, ketorolac injections, Mobic, Reglan injections, rizatriptan, sumatriptan, zonisamide, vyepti, qulipta, nurtec, ubrelvy and many others over the last 10 years, [place] therapy, Nerivio, cognitive behavioral therapy. Has not tried candesartan but his blood pressure is on  the low side and usually this is cost prohibitive or we can't get it approved, been to therapy and psychiatry. Has not tried verapamil but again is blood pressure is on the lower side. He tried Cephaly. VNS was cost prohibitive.  I reviewed chart patient's been seen at the [facility] emergency room twice in the last several months, on June 11, 2020 he presented with headache and on July 05, 2020 he presented with migraine and headache.  I reviewed these records: In February he stated migraine started after triggered by a strong scent, history of neck spasms with the migraine, severe, with aura of visual loss and a small slice in his right visual field, on a "chronic migraine cycle" since last June receiving Botox injections from his neurologist, received relief after migraine cocktail.  He presented in March with headache for 6 days, throbbing, typical for his migraines no relief with current medications.  Again felt better after migraine cocktail and was discharged home.  I also reviewed [place] [facility]'s notes, history of migraines that started in his late 83s, progressed to chronic migraines in 2012 with possible cluster headache, comorbid anxiety, depression, PTSD, panic attacks, ADD and COPD.  Appears she tried Ajovy and earlier this month switch back to [facility] which was effective previously.  Blood work collected December 29, 2019 shows a CMP with BUN 14 and creatinine 1.28 otherwise unremarkable,  CT head 06/11/2020: showed No acute intracranial abnormalities including mass lesion or mass effect, hydrocephalus, extra-axial fluid collection, midline shift, hemorrhage, or acute infarction, large ischemic events (personally reviewed images)  Review of Systems: Patient complains of symptoms per HPI as well as the following symptoms: panic attacks, social stressors . Pertinent negatives and positives per HPI. All others negative     Social History   Socioeconomic History   Marital  status: Married    Spouse name: [name]   Number of children: 1   Years of education: 18   Highest education level: Master's degree (e.g., MA, MS, MEng, MEd, MSW, MBA)  Occupational History   Occupation: disabled  Tobacco Use   Smoking status: Former    Packs/day: 1.50    Years: 40.00    Total pack years: 60.00    Types: Cigarettes    Quit date: 05/17/2013    Years since quitting: 8.4   Smokeless tobacco: Never   Tobacco comments:    Patient wears a nicotine patch daily  Vaping Use   Vaping Use: Former  Substance and Sexual Activity   Alcohol use: No    Alcohol/week: 0.0 standard drinks of alcohol   Drug use: No   Sexual activity: Not on file  Other Topics Concern   Not on file  Social History Narrative   Lives at home with wife and stepson    Right handed   Caffeine: 8 oz coke per day    Social Determinants of Health   Financial Resource Strain: Not on file  Food Insecurity: Not on file  Transportation Needs: Not on file  Physical Activity: Not on file  Stress: Not on file  Social Connections: Not on file  Intimate Partner Violence: Not on file    Family History  Problem Relation Age of Onset   Breast cancer Mother    Diabetes Mother    Osteoarthritis Mother    Stroke Mother    Bladder Cancer Father    Diabetes Father    Stroke Father    Hypertension Father    Pancreatic cancer Father    Migraines Father    Irritable bowel syndrome Father    Hypertension Brother    Other Brother        stomach ulcer   Colon cancer Neg Hx    Rectal cancer Neg Hx    Esophageal cancer Neg Hx     Past Medical History:  Diagnosis Date   ADHD (attention deficit hyperactivity disorder)    Anemia    Anxiety panic attack   Arthritis    COPD (chronic obstructive pulmonary disease) ([place])    bronchitis/emphysema   Coronary artery disease    Depression    Emphysema lung ([place])    Enlarged prostate    Fibromyalgia    GERD (gastroesophageal reflux disease)    Hyperlipidemia     Hypertension    IBS (irritable bowel syndrome)    Migraine    Panic attack     Patient Active Problem List   Diagnosis Date Noted   History of tobacco use 09/07/2021   Encounter for therapeutic drug monitoring 04/03/2021   Thrombus in heart chamber 04/03/2021   Hyperlipidemia 03/14/2021   Coronary artery disease    Chronic migraine without aura, with intractable migraine, so stated, with status migrainosus 07/14/2020   Severe episode of recurrent major depressive disorder, without psychotic features ([place]) 07/14/2020   Polyclonal gammopathy determined by serum protein electrophoresis 02/25/2018   Primary osteoarthritis of both feet 12/13/2017   History of BPH 12/13/2017   Family history of psoriasis in mother 12/13/2017   History of gastroesophageal reflux (GERD) 12/13/2017   Anxiety and depression 12/13/2017   COPD (chronic obstructive pulmonary disease) ([place]) 01/29/2013   CHEST PAIN-PRECORDIAL 11/01/2009   Primary osteoarthritis of both hands 10/12/2009   CHEST PAIN 10/12/2009    Past Surgical History:  Procedure Laterality Date   CARDIAC CATHETERIZATION     CORONARY STENT INTERVENTION N/A 03/14/2021   Procedure: CORONARY STENT INTERVENTION;  Surgeon: Jettie Booze, MD;  Location: [place];  Service: Cardiovascular;  Laterality: N/A;   LEFT HEART CATH AND CORONARY ANGIOGRAPHY N/A 03/14/2021   Procedure: LEFT HEART CATH AND CORONARY ANGIOGRAPHY;  Surgeon: Jettie Booze, MD;  Location: [place] [facility];  Service: Cardiovascular;  Laterality: N/A;   NO PAST SURGERIES      Current Outpatient Medications  Medication Sig Dispense Refill   acetaminophen (TYLENOL) 500 MG tablet Take 1,000 mg by mouth every 6 (six) hours as needed  for mild pain.     albuterol (VENTOLIN HFA) 108 (90 Base) MCG/ACT inhaler Inhale 2 puffs into the lungs every 6 (six) hours as needed for wheezing or shortness of breath. 8 g 2   ALPRAZolam (XANAX) 1 MG tablet Take 1 mg by mouth  4 (four) times daily.      B Complex-Biotin-FA (B-50 COMPLEX PO) Take 1 capsule by mouth daily.     Botulinum Toxin Type A (BOTOX) 200 units SOLR Provider to inject 155 units into the muscles of the head and neck every 3 months. Discard remainder. 1 each 3   BREO ELLIPTA 100-25 MCG/INH AEPB INHALE 1 PUFF BY MOUTH EVERY DAY (Patient taking differently: Inhale 1 puff into the lungs daily.) 60 each 10   Cholecalciferol (VITAMIN D3) 2000 UNITS TABS Take 4,000 Units by mouth daily after breakfast.     clopidogrel (PLAVIX) 75 MG tablet Take 1 tablet (75 mg total) by mouth daily with breakfast. 90 tablet 3   doxazosin (CARDURA) 8 MG tablet Take 8 mg by mouth daily.     escitalopram (LEXAPRO) 20 MG tablet Take 10 mg by mouth in the morning.  2   Ferrous Sulfate (IRON PO) Take 65 mg by mouth daily after breakfast. Hema Plex Iron     gabapentin (NEURONTIN) 400 MG capsule Take 400 mg by mouth 4 (four) times daily.      MAGNESIUM MALATE PO Take 400 mg by mouth daily after breakfast.     memantine (NAMENDA) 10 MG tablet Take 20 mg in the morning and 10 mg in the late afternoon (Patient taking differently: Take 10-20 mg by mouth See admin instructions. Take 20 mg in the morning and 10 mg by mouth with supper) 90 tablet 0   Multiple Vitamin (MULTIVITAMIN WITH MINERALS) TABS tablet Take 1 tablet by mouth daily after breakfast. Centrum Silver for Men     Nerve Stimulator (NERIVIO) DEVI USE WITHIN 1 HR OF MIGRAINE. SET A STRONG YET COMFORTABLE INTENSITY LEVEL & MAINTAIN LEVEL FOR 45 MINS 1 each 12   nicotine (NICODERM CQ - DOSED IN MG/24 HOURS) 14 mg/24hr patch Place 14 mg onto the skin daily.     nitroGLYCERIN (NITROSTAT) 0.4 MG SL tablet Place 1 tablet (0.4 mg total) under the tongue every 5 (five) minutes as needed. (Patient taking differently: Place 0.4 mg under the tongue every 5 (five) minutes as needed for chest pain.) 25 tablet 2   Omega-3 Fatty Acids (FISH OIL) 1200 MG CAPS Take 1,200 mg by mouth daily after  breakfast.     pantoprazole (PROTONIX) 20 MG tablet Take 1 tablet (20 mg total) by mouth daily. 90 tablet 1   phenylephrine-shark liver oil-mineral oil-petrolatum (PREPARATION H) 0.25-14-74.9 % rectal ointment Place 1 application rectally 2 (two) times daily as needed for hemorrhoids.     rosuvastatin (CRESTOR) 20 MG tablet Take 1 tablet (20 mg total) by mouth daily. 90 tablet 3   silodosin (RAPAFLO) 8 MG CAPS capsule Take 8 mg by mouth daily with breakfast.     Ubrogepant (UBRELVY) 100 MG TABS TAKE 1 TABLET MOUTH AS NEEDED AS CLOSE TO MIGRAINE ONSET AS POSSIBLE. MAY REPEAT IN 2 HOURS IF NEEDED. MAX 200 MG PER 24 HOURS. (Patient taking differently: Take 100 mg by mouth as directed. Take 1 tablet as close to migraine onset as possible. May repeat in 2 hours if needed, max dose 200 mg /daily.) 16 tablet 11   vitamin B-12 (CYANOCOBALAMIN) 500 MCG tablet Take 500 mcg by  mouth daily.     warfarin (COUMADIN) 5 MG tablet TAKE 1/2 TO 1 TABLET BY MOUTH ONCE DAILY AS DIRECTED BY ANTICOAGULATION CLINIC 30 tablet 2   pantoprazole (PROTONIX) 20 MG tablet Take 1 tablet (20 mg total) by mouth daily. 90 tablet 3   No current facility-administered medications for this visit.    Allergies as of 10/18/2021 - Review Complete 10/18/2021  Allergen Reaction Noted   Tetracycline Anaphylaxis and Other (See Comments) 11/30/2015   Triptans  10/19/2020   Effexor [venlafaxine hcl] Rash 08/24/2011   Prednisone Other (See Comments) 11/15/2017   Sulfamethoxazole-trimethoprim Nausea Only 08/24/2011    Vitals: BP 99/60   Pulse 64   Ht '5\' 6"'$  (1.676 m)   Wt 179 lb 3.2 oz (81.3 kg)   BMI 28.92 kg/m  Last Weight:  Wt Readings from Last 1 Encounters:  10/18/21 179 lb 3.2 oz (81.3 kg)   Last Height:   Ht Readings from Last 1 Encounters:  10/18/21 '5\' 6"'$  (1.676 m)  Exam: NAD, pleasant                  Speech:    Speech is normal; fluent and spontaneous with normal comprehension.  Cognition:    The patient is oriented  to person, place, and time;     recent and remote memory intact;     language fluent;    Cranial Nerves:    The pupils are equal, round, and reactive to light.Trigeminal sensation is intact and the muscles of mastication are normal. The face is symmetric. The palate elevates in the midline. Hearing intact. Voice is normal. Shoulder shrug is normal. The tongue has normal motion without fasciculations.   Coordination:  No dysmetria  Motor Observation:    No asymmetry, no atrophy, and no involuntary movements noted. Tone:    Normal muscle tone.     Strength:    Strength is V/V in the upper and lower limbs.      Sensation: intact to LT     Assessment/Plan:  66 year old intractable migrain patient.. He has a long history, been to multiple neurologists, has tried many medications. I had a long discussion with him again, I am not sure that I have anymore answers for him than previous neurologists and I recommend another pair of eyes maybe [facility]. We can continue his botox therapy here and we tried [place] for his intractable migraines and refractory depression. Also tried nerivio. Discussed other options, medical devices (he tried cefaly, he knows about VNS stimulator but financially not feasible). When patient comes I usually allocate extra time for himbecause he talks a lot about his social stressors, stress at home, things that are going on in his life that may be affecting his migraines, I have tried to express to him that unfortunately I am not skilled as a therapist, of course I want to know what is going on but there comes a point where I may not be able to help him.   On [place]. Also on botox. Vyepti was canceled. Also on memantine, nerivio,botox, gabapentin, magnesium, lexapro. Medications tried in the past include Emgality(worked great then stopped working, went up to 300 didn't help), Ajovy(Didn't work as well as others), Aimovig, Depakote, gabapentin, Topamax,  memantine, Effexor, nortriptyline/TCAs, beta-blockers are contraindicated due to COPD, sumatriptan, Nurtec, aspirin, Lexapro, ketorolac injections, Mobic, Reglan injections, rizatriptan, sumatriptan, zonisamide, vyepti, qulipta, nurtec, ubrelvy and many others over the last 10 years, TMS therapy, Nerivio, cognitive behavioral therapy. Has  not tried candesartan but his blood pressure is on the low side and usually this is cost prohibitive or we can't get it approved, been to therapy and psychiatry. Has not tried verapamil but again is blood pressure is on the lower side. He tried Cephaly. VNS was cost prohibitive.Memantine,nerivio,botox,vyepti,magnesium,lexapro, nurtec, somatic tracking and the curable app.  - Has also seen Rheumatology and had extensive workup in 2019. Been to ENT 02/2021.Regularly sees cardiology and primary care.Also follows with pulmonology last time 09/28/2020.  - Also had CT head 05/2020, 02/2016, 11/2014. MRI brain 09/2011. No etiology for headaches found  - denies symptoms of OSA   - Been to [facility] neurology Dr. Sima Matas, [facility] neurology Dr. Jaynee Eagles, Dr. Melton Alar headache specialist, [facility]  - Have been up front, I really don't have a lot more ideas for him, he needs an academic center or [facility] for his headache that will definitely have more resources and/or clinical trials than we do.He has decided to stay with [place], we can keep trying to think of other options  - stop vyepti, filled out forms for abbvie patient assistance for [place] -We are performing botox on him every 12 weeks. -We sent him to [place] therapy.  - We also started nerivio. He tried Cephaly. VNS was cost prohibitive - discussed spravato , would need to see psychiatry for this -. The nerivio helps some. He has been using it when having a new headache and the TMS helped some.  - Dry needling hurts but helps - recommended biofeedback - recommended cognitive behavioral therapy -  recommended psychiatry and therapist - Maybe [facility] has clinical trials? - Encouraged healthy lifestyle, exercise  Discussed: To prevent or relieve headaches, try the following: Cool Compress. Lie down and place a cool compress on your head.  Avoid headache triggers. If certain foods or odors seem to have triggered your migraines in the past, avoid them. A headache diary might help you identify triggers.  Include physical activity in your daily routine. Try a daily walk or other moderate aerobic exercise.  Manage stress. Find healthy ways to cope with the stressors, such as delegating tasks on your to-do list.  Practice relaxation techniques. Try deep breathing, yoga, massage and visualization.  Eat regularly. Eating regularly scheduled meals and maintaining a healthy diet might help prevent headaches. Also, drink plenty of fluids.  Follow a regular sleep schedule. Sleep deprivation might contribute to headaches Consider biofeedback. With this mind-body technique, you learn to control certain bodily functions -- such as muscle tension, heart rate and blood pressure -- to prevent headaches or reduce headache pain.    Proceed to emergency room if you experience new or worsening symptoms or symptoms do not resolve, if you have new neurologic symptoms or if headache is severe, or for any concerning symptom.   Provided education and documentation from American headache Society toolbox including articles on: chronic migraine medication overuse headache, chronic migraines, prevention of migraines, behavioral and other nonpharmacologic treatments for headache.  No orders of the defined types were placed in this encounter.  No orders of the defined types were placed in this encounter.    I spent over 90 minutes of face-to-face and non-face-to-face time with patient on the  No diagnosis found.   diagnosis.  This included previsit chart review, lab review, study review, order entry,  electronic health record documentation, patient education on the different diagnostic and therapeutic options, counseling and coordination of care, risks and benefits of management, compliance,  or risk factor reduction   Cc: Elwyn Reach, MD,   Sarina Ill, MD  [facility] [address] [place], [place] [address]  Phone [phone] Fax [phone]  I spent over 40 minutes of face-to-face and non-face-to-face time with patient on the No diagnosis found. diagnosis.  This included previsit chart review, lab review, study review, order entry, electronic health record documentation, patient education on the different diagnostic and therapeutic options, counseling and coordination of care, risks and benefits of management, compliance, or risk factor reduction

## 2021-10-23 ENCOUNTER — Ambulatory Visit: Payer: Medicare Other | Admitting: *Deleted

## 2021-10-23 DIAGNOSIS — Z5181 Encounter for therapeutic drug level monitoring: Secondary | ICD-10-CM

## 2021-10-23 DIAGNOSIS — I513 Intracardiac thrombosis, not elsewhere classified: Secondary | ICD-10-CM | POA: Diagnosis not present

## 2021-10-23 LAB — POCT INR: INR: 1.7 — AB (ref 2.0–3.0)

## 2021-10-23 NOTE — Patient Instructions (Signed)
Description   DO NOT HIGHLIGHT PAPERS & GIVE PRINTED PAPERWORK TO WIFE-THESE THINGS TRIGGER MIGRAINES. Today take 1 tablet then continue on same dosage 1/2 tablet daily except 1 tablet on Tuesdays, Thursdays and Saturdays. Continue eating leafy veggies daily as normal. Recheck INR in 4 weeks at the [facility] office. Anticoagulation Clinic [phone]

## 2021-10-27 DIAGNOSIS — S60512A Abrasion of left hand, initial encounter: Secondary | ICD-10-CM | POA: Diagnosis not present

## 2021-10-30 ENCOUNTER — Encounter: Payer: Self-pay | Admitting: Neurology

## 2021-11-13 ENCOUNTER — Other Ambulatory Visit: Payer: Self-pay | Admitting: Emergency Medicine

## 2021-11-20 ENCOUNTER — Ambulatory Visit: Payer: Medicare Other | Admitting: *Deleted

## 2021-11-20 DIAGNOSIS — Z5181 Encounter for therapeutic drug level monitoring: Secondary | ICD-10-CM

## 2021-11-20 DIAGNOSIS — I513 Intracardiac thrombosis, not elsewhere classified: Secondary | ICD-10-CM | POA: Diagnosis not present

## 2021-11-20 LAB — POCT INR: INR: 2.4 (ref 2.0–3.0)

## 2021-11-20 NOTE — Patient Instructions (Signed)
Description   DO NOT HIGHLIGHT PAPERS & GIVE PRINTED PAPERWORK TO WIFE-THESE THINGS TRIGGER MIGRAINES. Continue on same dosage 1/2 tablet daily except 1 tablet on Tuesdays, Thursdays and Saturdays. Continue eating leafy veggies daily as normal. Recheck INR in 5 weeks at the [facility] office. Anticoagulation Clinic [phone]

## 2021-11-28 ENCOUNTER — Telehealth: Payer: Self-pay | Admitting: Emergency Medicine

## 2021-11-28 MED ORDER — FLUTICASONE FUROATE-VILANTEROL 100-25 MCG/ACT IN AEPB
1.0000 | INHALATION_SPRAY | Freq: Every day | RESPIRATORY_TRACT | 2 refills | Status: DC
Start: 2021-11-28 — End: 2022-03-28

## 2021-11-28 NOTE — Telephone Encounter (Signed)
I left a detailed message per DPR that I have sent a refill of his Breo inhaler to [facility]. He was asked to call back with any questions. Nothing further needed.

## 2021-12-03 ENCOUNTER — Other Ambulatory Visit: Payer: Self-pay | Admitting: Interventional Cardiology

## 2021-12-26 ENCOUNTER — Ambulatory Visit: Payer: Medicare Other | Attending: Interventional Cardiology

## 2021-12-26 DIAGNOSIS — Z5181 Encounter for therapeutic drug level monitoring: Secondary | ICD-10-CM | POA: Diagnosis not present

## 2021-12-26 DIAGNOSIS — I513 Intracardiac thrombosis, not elsewhere classified: Secondary | ICD-10-CM

## 2021-12-26 LAB — POCT INR: INR: 1.8 — AB (ref 2.0–3.0)

## 2021-12-26 NOTE — Patient Instructions (Signed)
DO NOT HIGHLIGHT PAPERS & GIVE PRINTED PAPERWORK TO WIFE-THESE THINGS TRIGGER MIGRAINES. TAKE 1.5 TABLETS TODAY ONLY and then Continue on same dosage 1/2 tablet daily except 1 tablet on Tuesdays, Thursdays and Saturdays. Continue eating leafy veggies daily as normal. Recheck INR in 5 weeks at the [facility] office. Anticoagulation Clinic [phone]

## 2022-01-10 ENCOUNTER — Ambulatory Visit: Payer: Medicare Other | Admitting: Adult Health

## 2022-01-10 DIAGNOSIS — G43711 Chronic migraine without aura, intractable, with status migrainosus: Secondary | ICD-10-CM | POA: Diagnosis not present

## 2022-01-10 MED ORDER — ONABOTULINUMTOXINA 200 UNITS IJ SOLR
155.0000 [IU] | Freq: Once | INTRAMUSCULAR | Status: AC
  Administered 2022-01-10: 155 [IU] via INTRAMUSCULAR

## 2022-01-10 NOTE — Progress Notes (Signed)
Botox- 200 units x 1 vial Lot: [id number] Expiration: 05/2024 NDC: 7209-1980-22  Bacteriostatic 0.9% Sodium Chloride- 50m total Lot: [id number]: 11/22/2022 NDC: [address]-41 Dx: [id number]/P

## 2022-01-10 NOTE — Progress Notes (Signed)
01/10/2022: Had a migraine yesterday. Saw Dr. Jaynee Eagles in June to discuss Medication.   10/10/2021:  Had more headaches in may. Otherwise doing ok.   BOTOX PROCEDURE NOTE FOR MIGRAINE HEADACHE    Contraindications and precautions discussed with patient(above). Aseptic procedure was observed and patient tolerated procedure. Procedure performed by Ward Givens, NP  The condition has existed for more than 6 months, and pt does not have a diagnosis of ALS, Myasthenia Gravis or Lambert-Eaton Syndrome.  Risks and benefits of injections discussed and pt agrees to proceed with the procedure.  Written consent obtained  These injections are medically necessary.  These injections do not cause sedations or hallucinations which the oral therapies may cause.  Indication/Diagnosis: chronic migraine BOTOX(J0585) injection was performed according to protocol by [employer]. 200 units of BOTOX was dissolved into 4 cc NS.   NDC: [address]-83  Type of toxin: Botox Botox- 200 units x 1 vial Lot: [id number] Expiration: 05/2024 NDC: 9767-3419-37   Bacteriostatic 0.9% Sodium Chloride- 13m total Lot: [id number]: 11/22/2022 NDC: [address]-42  Dx: [id number]/P     Description of procedure:  The patient was placed in a sitting position. The standard protocol was used for Botox as follows, with 5 units of Botox injected at each site:   -Procerus muscle, midline injection  -Corrugator muscle, bilateral injection  -Frontalis muscle, bilateral injection, with 2 sites each side, medial injection was performed in the upper one third of the frontalis muscle, in the region vertical from the medial inferior edge of the superior orbital rim. The lateral injection was again in the upper one third of the forehead vertically above the lateral limbus of the cornea, 1.5 cm lateral to the medial injection site.  -Temporalis muscle injection, 4 sites, bilaterally. The first injection was 3 cm above the tragus of  the ear, second injection site was 1.5 cm to 3 cm up from the first injection site in line with the tragus of the ear. The third injection site was 1.5-3 cm forward between the first 2 injection sites. The fourth injection site was 1.5 cm posterior to the second injection site.  -Occipitalis muscle injection, 3 sites, bilaterally. The first injection was done one half way between the occipital protuberance and the tip of the mastoid process behind the ear. The second injection site was done lateral and superior to the first, 1 fingerbreadth from the first injection. The third injection site was 1 fingerbreadth superiorly and medially from the first injection site.  -Cervical paraspinal muscle injection, 2 sites, bilateral knee first injection site was 1 cm from the midline of the cervical spine, 3 cm inferior to the lower border of the occipital protuberance. The second injection site was 1.5 cm superiorly and laterally to the first injection site.  -Trapezius muscle injection was performed at 3 sites, bilaterally. The first injection site was in the upper trapezius muscle halfway between the inflection point of the neck, and the acromion. The second injection site was one half way between the acromion and the first injection site. The third injection was done between the first injection site and the inflection point of the neck.   Will return for repeat injection in 3 months.   A 200 units of Botox was used, 155 units were injected, the rest of the Botox was wasted. The patient tolerated the procedure well, there were no complications of the above procedure.  MWard Givens MSN, NP-C 01/10/2022, 3:42 PM [facility] [address] [place] [address] [phone]

## 2022-01-15 ENCOUNTER — Telehealth: Payer: Self-pay | Admitting: *Deleted

## 2022-01-15 NOTE — Telephone Encounter (Signed)
Due to change in Dr Hassell Done schedule I placed call to patient to change appointment from 9/29 to 9/27 at 10:00.  Left message to call office.

## 2022-01-16 ENCOUNTER — Other Ambulatory Visit: Payer: Self-pay | Admitting: Interventional Cardiology

## 2022-01-17 NOTE — Progress Notes (Signed)
Erroneous encounter

## 2022-01-19 ENCOUNTER — Ambulatory Visit: Payer: Medicare Other | Admitting: Interventional Cardiology

## 2022-01-19 ENCOUNTER — Encounter: Payer: Self-pay | Admitting: Interventional Cardiology

## 2022-01-19 ENCOUNTER — Ambulatory Visit (INDEPENDENT_AMBULATORY_CARE_PROVIDER_SITE_OTHER): Payer: Medicare Other | Admitting: Pharmacist

## 2022-01-19 ENCOUNTER — Ambulatory Visit: Payer: Medicare Other | Attending: Interventional Cardiology | Admitting: Interventional Cardiology

## 2022-01-19 ENCOUNTER — Ambulatory Visit (INDEPENDENT_AMBULATORY_CARE_PROVIDER_SITE_OTHER): Payer: Medicare Other | Admitting: Physician Assistant

## 2022-01-19 VITALS — BP 100/62 | HR 76 | Ht 66.0 in | Wt 169.0 lb

## 2022-01-19 DIAGNOSIS — I5022 Chronic systolic (congestive) heart failure: Secondary | ICD-10-CM | POA: Diagnosis not present

## 2022-01-19 DIAGNOSIS — I25118 Atherosclerotic heart disease of native coronary artery with other forms of angina pectoris: Secondary | ICD-10-CM

## 2022-01-19 DIAGNOSIS — Z5181 Encounter for therapeutic drug level monitoring: Secondary | ICD-10-CM

## 2022-01-19 DIAGNOSIS — I513 Intracardiac thrombosis, not elsewhere classified: Secondary | ICD-10-CM | POA: Diagnosis not present

## 2022-01-19 DIAGNOSIS — I252 Old myocardial infarction: Secondary | ICD-10-CM | POA: Diagnosis not present

## 2022-01-19 DIAGNOSIS — I493 Ventricular premature depolarization: Secondary | ICD-10-CM

## 2022-01-19 DIAGNOSIS — I959 Hypotension, unspecified: Secondary | ICD-10-CM

## 2022-01-19 DIAGNOSIS — E785 Hyperlipidemia, unspecified: Secondary | ICD-10-CM

## 2022-01-19 DIAGNOSIS — Z955 Presence of coronary angioplasty implant and graft: Secondary | ICD-10-CM

## 2022-01-19 LAB — POCT INR: INR: 2.6 (ref 2–3)

## 2022-01-19 NOTE — Progress Notes (Signed)
Cardiology Office Note   Date:  01/19/2022   ID:  Kyle Walters, DOB Nov 15, 1955, MRN [id number]  PCP:  Elwyn Reach, MD    No chief complaint on file.  CAD/chronic systolic heart failure  Wt Readings from Last 3 Encounters:  01/19/22 169 lb (76.7 kg)  10/18/21 179 lb 3.2 oz (81.3 kg)  10/10/21 180 lb (81.6 kg)       History of Present Illness: Kyle Walters is a 66 y.o. male  with left ventricular dysfunction.   Patient had a lung cancer screening CT.  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  He was set up for echocardiogram which was done today in our office.  This revealed an LV apical aneurysm and there appeared to be thrombus in the aneurysm with Definity contrast injection.   He had a stress test many years ago no prior catheterization or known MI that he has had.  He does have anxiety and panic issues and has had chest pain on multiple occasions.  He actually went to the emergency room due to anxiety causing chest pain, from reading the CT scan report.  His troponin in the ER was normal.  He was sent home.     He denies any exertional angina.  His exercise level has dropped off in the last few years.  He has migraines stimulated by any type of smell.  This has limited the amount that he goes outside.   Of note, his ECG done in 2022 appeared to show poor R wave progression.  When I look back at prior ECG from 2017, he had normal R wave progression.  No other cardiac testing in that time.   Echo showed LV thrombus and he was started on ELiquis. CTA of the coronaries was done.  This showed an occluded LAD.  There was a possible stenosis in the RCA which was medically treated for some time.  His circumflex and left main were widely patent.  Cardiac MRI showed nonviable apex.    He took Eliquis for 3 months and then had cath in 02/2021: "Mid LAD lesion is 100% stenosed.  Left to left collaterals.  The apex was nonviable by cardiac MRI.   RPAV lesion is 100% stenosed.  Left  to right collaterals.   Prox RCA lesion is 90% stenosed.   A drug-eluting stent was successfully placed using a STENT ONYX FRONTIER 3.0X34, postdilated to greater than 3.75 mm.   Post intervention, there is a 0% residual stenosis.   Mid RCA lesion is 75% stenosed.   A drug-eluting stent was successfully placed using a STENT ONYX FRONTIER 2.75X22.   Post intervention, there is a 0% residual stenosis.   LV end diastolic pressure is normal.   There is no aortic valve stenosis.   Continue dual antiplatelet therapy with aspirin and Plavix.  He has completed 3 months of anticoagulation with Eliquis for his LV thrombus.  Continue aggressive secondary prevention including high-dose statin.  Prevacid will have to be changed to Protonix."   There was concern for recurrence of LV thrombus since his apex was found to be nonviable.    He restarted Boswellia after the cath and started having more dehydration and headaches.    In March 2023, he was feeling some chest pain that he thought was reflux.  Went to the ER, work-up was negative for any heart attack,  heart failure.  Symptoms got better with a GI cocktail.   He has modified his diet to reduce  GERD.  Less Tabasco sauce, no caffeine, avoids eating late at night.  He did finish cardiac rehab.    Has migraines [place] so wants to change his location of INR testing.   He lost weight by decreasing sugar intake.   He does walk regularly. Uses a peddler.  He went to cardiac rehab and had a migraine triggered by Sani-cloth smell.   Denies : Chest pain. Dizziness. Leg edema. Nitroglycerin use. Orthopnea. Palpitations. Paroxysmal nocturnal dyspnea. Shortness of breath. Syncope.        Past Medical History:  Diagnosis Date   ADHD (attention deficit hyperactivity disorder)    Anemia    Anxiety panic attack   Arthritis    COPD (chronic obstructive pulmonary disease) (HCC)    bronchitis/emphysema   Coronary artery  disease    Depression    Emphysema lung (HCC)    Enlarged prostate    Fibromyalgia    GERD (gastroesophageal reflux disease)    Hyperlipidemia    Hypertension    IBS (irritable bowel syndrome)    Migraine    Panic attack     Past Surgical History:  Procedure Laterality Date   CARDIAC CATHETERIZATION     CORONARY STENT INTERVENTION N/A 03/14/2021   Procedure: CORONARY STENT INTERVENTION;  Surgeon: Jettie Booze, MD;  Location: [facility];  Service: Cardiovascular;  Laterality: N/A;   LEFT HEART CATH AND CORONARY ANGIOGRAPHY N/A 03/14/2021   Procedure: LEFT HEART CATH AND CORONARY ANGIOGRAPHY;  Surgeon: Jettie Booze, MD;  Location: [facility];  Service: Cardiovascular;  Laterality: N/A;   NO PAST SURGERIES       Current Outpatient Medications  Medication Sig Dispense Refill   acetaminophen (TYLENOL) 500 MG tablet Take 1,000 mg by mouth every 6 (six) hours as needed for mild pain.     albuterol (VENTOLIN HFA) 108 (90 Base) MCG/ACT inhaler Inhale 2 puffs into the lungs every 6 (six) hours as needed for wheezing or shortness of breath. 8 g 2   ALPRAZolam (XANAX) 1 MG tablet Take 1 mg by mouth 4 (four) times daily.      B Complex-Biotin-FA (B-50 COMPLEX PO) Take 1 capsule by mouth daily.     Botulinum Toxin Type A (BOTOX) 200 units SOLR Provider to inject 155 units into the muscles of the head and neck every 3 months. Discard remainder. 1 each 3   Cholecalciferol (VITAMIN D3) 2000 UNITS TABS Take 4,000 Units by mouth daily after breakfast.     clopidogrel (PLAVIX) 75 MG tablet Take 1 tablet (75 mg total) by mouth daily with breakfast. 90 tablet 3   doxazosin (CARDURA) 8 MG tablet Take 8 mg by mouth daily.     escitalopram (LEXAPRO) 20 MG tablet Take 10 mg by mouth in the morning.  2   Ferrous Sulfate (IRON PO) Take 65 mg by mouth daily after breakfast. Hema Plex Iron     fluticasone furoate-vilanterol (BREO ELLIPTA) 100-25 MCG/ACT AEPB Inhale 1 puff into  the lungs daily. 60 each 2   gabapentin (NEURONTIN) 400 MG capsule Take 400 mg by mouth 4 (four) times daily.      MAGNESIUM MALATE PO Take 400 mg by mouth daily after breakfast.     memantine (NAMENDA) 10 MG tablet Take 20 mg in the morning and 10 mg in the late afternoon (Patient taking differently: Take 10-20 mg by mouth See admin instructions. Take 20 mg in the morning and 10  mg by mouth with supper) 90 tablet 0   Multiple Vitamin (MULTIVITAMIN WITH MINERALS) TABS tablet Take 1 tablet by mouth daily after breakfast. Centrum Silver for Men     Nerve Stimulator (NERIVIO) DEVI USE WITHIN 1 HR OF MIGRAINE. SET A STRONG YET COMFORTABLE INTENSITY LEVEL & MAINTAIN LEVEL FOR 45 MINS 1 each 12   nitroGLYCERIN (NITROSTAT) 0.4 MG SL tablet Place 1 tablet (0.4 mg total) under the tongue every 5 (five) minutes as needed. (Patient taking differently: Place 0.4 mg under the tongue every 5 (five) minutes as needed for chest pain.) 25 tablet 2   Omega-3 Fatty Acids (FISH OIL) 1200 MG CAPS Take 1,200 mg by mouth daily after breakfast.     pantoprazole (PROTONIX) 20 MG tablet Take 1 tablet (20 mg total) by mouth daily. 90 tablet 1   phenylephrine-shark liver oil-mineral oil-petrolatum (PREPARATION H) 0.25-14-74.9 % rectal ointment Place 1 application rectally 2 (two) times daily as needed for hemorrhoids.     rosuvastatin (CRESTOR) 20 MG tablet TAKE 1 TABLET BY MOUTH EVERY DAY 90 tablet 3   silodosin (RAPAFLO) 8 MG CAPS capsule Take 8 mg by mouth daily with breakfast.     Ubrogepant (UBRELVY) 100 MG TABS TAKE 1 TABLET MOUTH AS NEEDED AS CLOSE TO MIGRAINE ONSET AS POSSIBLE. MAY REPEAT IN 2 HOURS IF NEEDED. MAX 200 MG PER 24 HOURS. (Patient taking differently: Take 100 mg by mouth as directed. Take 1 tablet as close to migraine onset as possible. May repeat in 2 hours if needed, max dose 200 mg /daily.) 16 tablet 11   vitamin B-12 (CYANOCOBALAMIN) 500 MCG tablet Take 500 mcg by mouth daily.     warfarin (COUMADIN) 5 MG  tablet TAKE 1/2 TO 1 TABLET BY MOUTH ONCE DAILY AS DIRECTED BY ANTICOAGULATION CLINIC 90 tablet 1   No current facility-administered medications for this visit.    Allergies:   Tetracycline, Triptans, Effexor [venlafaxine hcl], Prednisone, and Sulfamethoxazole-trimethoprim    Social History:  The patient  reports that he quit smoking about 8 years ago. His smoking use included cigarettes. He has a 60.00 pack-year smoking history. He has never used smokeless tobacco. He reports that he does not drink alcohol and does not use drugs.   Family History:  The patient's family history includes Bladder Cancer in his father; Breast cancer in his mother; Diabetes in his father and mother; Hypertension in his brother and father; Irritable bowel syndrome in his father; Migraines in his father; Osteoarthritis in his mother; Other in his brother; Pancreatic cancer in his father; Stroke in his father and mother.    ROS:  Please see the history of present illness.   Otherwise, review of systems are positive for migraines.   All other systems are reviewed and negative.    PHYSICAL EXAM: VS:  BP 100/62   Pulse 76   Ht '5\' 6"'$  (1.676 m)   Wt 169 lb (76.7 kg)   SpO2 95%   BMI 27.28 kg/m  , BMI Body mass index is 27.28 kg/m. GEN: Well nourished, well developed, in no acute distress HEENT: normal Neck: no JVD, carotid bruits, or masses Cardiac: RRR; no murmurs, rubs, or gallops,no edema  Respiratory:  clear to auscultation bilaterally, normal work of breathing GI: soft, nontender, nondistended, + BS MS: no deformity or atrophy Skin: warm and dry, no rash Neuro:  Strength and sensation are intact Psych: euthymic mood, full affect   EKG:   The ekg ordered today demonstrates NSR, poor R  wave progression, no ST changes   Recent Labs: 05/19/2021: Magnesium 2.0 08/20/2021: ALT 36; BUN 16; Creatinine, Ser 1.00; Hemoglobin 15.0; Platelets 239; Potassium 4.3; Sodium 139   Lipid Panel    Component Value  Date/Time   CHOL 137 05/01/2021 0851   TRIG 62 05/01/2021 0851   HDL 50 05/01/2021 0851   CHOLHDL 2.7 05/01/2021 0851   LDLCALC 74 05/01/2021 0851     Other studies Reviewed: Additional studies/ records that were reviewed today with results demonstrating: labs reviewed.   ASSESSMENT AND PLAN:  CAD/old MI: Status post RCA stent.  Known occluded LAD.  No significant viability towards the apex. Abnormal echocardiogram/chronic systolic heart failure: LV thrombus: Taking warfarin long-term due to the concern that his akinetic apex would continue to be a place where thrombus could form and increase his risk of stroke.  No recent bleeding problems.  Hyperlipidemia: LDL 74 in Jan 2023. Continue rosuvastatin.  Low blood pressure: Worsened with medications for BPH.  We have mentioned that if his dizziness worsens, may need to stop Rapaflo. Emphysema: prior smoker.  Wore nicotine patches for 8 years.  Has been of patches for a year.    Current medicines are reviewed at length with the patient today.  The patient concerns regarding his medicines were addressed.  The following changes have been made:  No change  Labs/ tests ordered today include:  No orders of the defined types were placed in this encounter.   Recommend 150 minutes/week of aerobic exercise Low fat, low carb, high fiber diet recommended  Disposition:   FU in 6 months   Signed, Larae Grooms, MD  01/19/2022 2:25 PM    [facility] [place], [place], [place]  [address] Phone: [phone]; Fax: [phone]

## 2022-01-19 NOTE — Patient Instructions (Signed)
Medication Instructions:  Your physician recommends that you continue on your current medications as directed. Please refer to the Current Medication list given to you today.  *If you need a refill on your cardiac medications before your next appointment, please call your pharmacy*   Lab Work: none If you have labs (blood work) drawn today and your tests are completely normal, you will receive your results only by: MyChart Message (if you have MyChart) OR A paper copy in the mail If you have any lab test that is abnormal or we need to change your treatment, we will call you to review the results.   Testing/Procedures: none   Follow-Up: At [facility], you and your health needs are our priority.  As part of our continuing mission to provide you with exceptional heart care, we have created designated Provider Care Teams.  These Care Teams include your primary Cardiologist (physician) and Advanced Practice Providers (APPs -  Physician Assistants and Nurse Practitioners) who all work together to provide you with the care you need, when you need it.  We recommend signing up for the patient portal called "MyChart".  Sign up information is provided on this After Visit Summary.  MyChart is used to connect with patients for Virtual Visits (Telemedicine).  Patients are able to view lab/test results, encounter notes, upcoming appointments, etc.  Non-urgent messages can be sent to your provider as well.   To learn more about what you can do with MyChart, go to [email or web address].    Your next appointment:   6 month(s)  The format for your next appointment:   In Person  Provider:   Jayadeep Varanasi, MD     Other Instructions    Important Information About Sugar       

## 2022-01-19 NOTE — Patient Instructions (Signed)
Continue on same dosage 1/2 tablet daily except 1 tablet on Tuesdays, Thursdays and Saturdays. Continue eating leafy veggies daily as normal. Recheck INR in 6 weeks at the [facility] office. Anticoagulation Clinic [phone]

## 2022-01-19 NOTE — Progress Notes (Signed)
Patient does not want to come back to the [facility] coumadin clinic bc of the smell of chicken for [employer] gives him migraines. I gave him the other offices that heartcare has. He will let us know which one he wants to go to.

## 2022-01-22 ENCOUNTER — Encounter: Payer: Self-pay | Admitting: Emergency Medicine

## 2022-01-22 NOTE — Telephone Encounter (Signed)
I do not know of any direct connection between the beta agonists (like Breo) and migraine although it is possible that these medications could transiently raise blood pressure which can be associated with migraine exacerbation.  With regard to how frequently to take the [facility], he could try cutting down (to every other day for example) to see if his breathing declines.  If he still gets good breathing control then I think would be reasonable to decrease.

## 2022-01-22 NOTE — Telephone Encounter (Signed)
Mychart message sent by pt: Kyle Walters "[name]"  P [facility] (supporting Collene Gobble, MD) 28 minutes ago (10:05 AM)    I've told you that I only take the Breo Ellipta five days a week because it exacerbates a tremor I've always had. I have chronic migraines. What are the chances it's also exacerbating them, and if they are, how much more can I cut back on the Breo safely? I can't take anti-choligenerics because of BPH.    Dr. Lamonte Sakai, please advise.

## 2022-02-02 ENCOUNTER — Ambulatory Visit: Payer: Medicare Other | Admitting: Physician Assistant

## 2022-02-02 ENCOUNTER — Telehealth: Payer: Self-pay | Admitting: *Deleted

## 2022-02-02 ENCOUNTER — Encounter: Payer: Self-pay | Admitting: Physician Assistant

## 2022-02-02 VITALS — BP 100/60 | HR 65 | Ht 66.0 in | Wt 169.5 lb

## 2022-02-02 DIAGNOSIS — K219 Gastro-esophageal reflux disease without esophagitis: Secondary | ICD-10-CM

## 2022-02-02 DIAGNOSIS — Z1211 Encounter for screening for malignant neoplasm of colon: Secondary | ICD-10-CM

## 2022-02-02 MED ORDER — PLENVU 140 G PO SOLR: 1.0000 | ORAL | 0 refills | Status: DC

## 2022-02-02 NOTE — Progress Notes (Signed)
Subjective:    Patient ID: Kyle Walters, male    DOB: [birth year], 66 y.o.   MRN: [id number]  HPI Kyle Walters is a 66 year old white male, established with Dr. Ardis Hughs who was seen here once in June 2022 after he had been referred for GERD and also for colon screening.  He was to be scheduled for colonoscopy and EGD.  Procedures did not occur as patient developed other health issues around that time. He comes back in today to discuss colonoscopy and upper endoscopy.  He says he has had some mild problems with constipation over the past 6 months.  On further questioning is actually having a bowel movement every day but says that he has some straining and sometimes his stools are hard.  He is starting a regimen of eating prunes on a regular basis and has not been using any other regular laxatives or stool softeners. He has intentionally lost 35 pounds since he had an MI.  He feels that this is helped his GERD symptoms but he continues Protonix 40 mg p.o. daily.  No dysphagia or odynophagia.  He has not had prior EGD.  Per prior notes he did have 1 colonoscopy which was probably about 17 years ago done elsewhere and believes that he had 1 small polyp removed.  Patient has history of chronic migraines, coronary artery disease for which he underwent cardiac cath in November 2022 per Dr. Emeterio Reeve and was found to have 100% LAD stenosis, proximal RCA 90% stenosis with drug-eluting stent placement and drug-eluting stent placement to mid RCA stenosis.  He has been on Plavix since that time  Per cardiology notes he also has history of a left ventricular thrombus, initially diagnosed in 2022. He is being maintained on Coumadin because of an akinetic apex and concern for increased risk for recurrent left ventricular thrombus. Most recent echo in April 2023 with EF of 50 to 55%, left ventricular regional wall motion abnormality, no aortic stenosis.  He also has COPD, no oxygen use, polyclonal gammopathy and history  of depression.    Review of Systems.Pertinent positive and negative review of systems were noted in the above HPI section.  All other review of systems was otherwise negative.   Outpatient Encounter Medications as of 02/02/2022  Medication Sig   acetaminophen (TYLENOL) 500 MG tablet Take 1,000 mg by mouth every 6 (six) hours as needed for mild pain.   albuterol (VENTOLIN HFA) 108 (90 Base) MCG/ACT inhaler Inhale 2 puffs into the lungs every 6 (six) hours as needed for wheezing or shortness of breath.   ALPRAZolam (XANAX) 1 MG tablet Take 1 mg by mouth 4 (four) times daily.    B Complex-Biotin-FA (B-50 COMPLEX PO) Take 1 capsule by mouth daily.   Botulinum Toxin Type A (BOTOX) 200 units SOLR Provider to inject 155 units into the muscles of the head and neck every 3 months. Discard remainder.   Cholecalciferol (VITAMIN D3) 2000 UNITS TABS Take 4,000 Units by mouth daily after breakfast.   clopidogrel (PLAVIX) 75 MG tablet Take 1 tablet (75 mg total) by mouth daily with breakfast.   doxazosin (CARDURA) 8 MG tablet Take 8 mg by mouth daily.   escitalopram (LEXAPRO) 20 MG tablet Take 10 mg by mouth in the morning.   Ferrous Sulfate (IRON PO) Take 65 mg by mouth daily after breakfast. Hema Plex Iron   fluticasone furoate-vilanterol (BREO ELLIPTA) 100-25 MCG/ACT AEPB Inhale 1 puff into the lungs daily.   gabapentin (NEURONTIN) 400 MG capsule Take  400 mg by mouth 4 (four) times daily.    MAGNESIUM MALATE PO Take 400 mg by mouth daily after breakfast.   memantine (NAMENDA) 10 MG tablet Take 20 mg in the morning and 10 mg in the late afternoon (Patient taking differently: Take 10-20 mg by mouth See admin instructions. Take 20 mg in the morning and 10 mg by mouth with supper)   Multiple Vitamin (MULTIVITAMIN WITH MINERALS) TABS tablet Take 1 tablet by mouth daily after breakfast. Centrum Silver for Men   Nerve Stimulator (NERIVIO) DEVI USE WITHIN 1 HR OF MIGRAINE. SET A STRONG YET COMFORTABLE INTENSITY  LEVEL & MAINTAIN LEVEL FOR 45 MINS   nitroGLYCERIN (NITROSTAT) 0.4 MG SL tablet Place 1 tablet (0.4 mg total) under the tongue every 5 (five) minutes as needed. (Patient taking differently: Place 0.4 mg under the tongue every 5 (five) minutes as needed for chest pain.)   Omega-3 Fatty Acids (FISH OIL) 1200 MG CAPS Take 1,200 mg by mouth daily after breakfast.   pantoprazole (PROTONIX) 20 MG tablet Take 1 tablet (20 mg total) by mouth daily.   PEG-KCl-NaCl-NaSulf-Na Asc-C (PLENVU) 140 g SOLR Take 1 kit by mouth as directed. Use coupon: BIN: 680321 PNC: CNRX Group: [id number] ID: [id number]   phenylephrine-shark liver oil-mineral oil-petrolatum (PREPARATION H) 0.25-14-74.9 % rectal ointment Place 1 application rectally 2 (two) times daily as needed for hemorrhoids.   rosuvastatin (CRESTOR) 20 MG tablet TAKE 1 TABLET BY MOUTH EVERY DAY   silodosin (RAPAFLO) 8 MG CAPS capsule Take 8 mg by mouth daily with breakfast.   Ubrogepant (UBRELVY) 100 MG TABS TAKE 1 TABLET MOUTH AS NEEDED AS CLOSE TO MIGRAINE ONSET AS POSSIBLE. MAY REPEAT IN 2 HOURS IF NEEDED. MAX 200 MG PER 24 HOURS. (Patient taking differently: Take 100 mg by mouth as directed. Take 1 tablet as close to migraine onset as possible. May repeat in 2 hours if needed, max dose 200 mg /daily.)   vitamin B-12 (CYANOCOBALAMIN) 500 MCG tablet Take 500 mcg by mouth daily.   warfarin (COUMADIN) 5 MG tablet TAKE 1/2 TO 1 TABLET BY MOUTH ONCE DAILY AS DIRECTED BY ANTICOAGULATION CLINIC   No facility-administered encounter medications on file as of 02/02/2022.   Allergies  Allergen Reactions   Tetracycline Anaphylaxis and Other (See Comments)   Triptans     Contraindicated d/t h/o MI    Effexor [Venlafaxine Hcl] Rash   Prednisone Other (See Comments)    Unable to sleep    Sulfamethoxazole-Trimethoprim Nausea Only   Patient Active Problem List   Diagnosis Date Noted   History of tobacco use 09/07/2021   Encounter for therapeutic drug monitoring  04/03/2021   Thrombus in heart chamber 04/03/2021   Hyperlipidemia 03/14/2021   Coronary artery disease    Chronic migraine without aura, with intractable migraine, so stated, with status migrainosus 07/14/2020   Severe episode of recurrent major depressive disorder, without psychotic features ([place]) 07/14/2020   Polyclonal gammopathy determined by serum protein electrophoresis 02/25/2018   Primary osteoarthritis of both feet 12/13/2017   History of BPH 12/13/2017   Family history of psoriasis in mother 12/13/2017   History of gastroesophageal reflux (GERD) 12/13/2017   Anxiety and depression 12/13/2017   COPD (chronic obstructive pulmonary disease) ([place]) 01/29/2013   CHEST PAIN-PRECORDIAL 11/01/2009   Primary osteoarthritis of both hands 10/12/2009   CHEST PAIN 10/12/2009   Social History   Socioeconomic History   Marital status: Married    Spouse name: [name]   Number of children: 1  Years of education: 1   Highest education level: Master's degree (e.g., MA, MS, MEng, MEd, MSW, MBA)  Occupational History   Occupation: disabled  Tobacco Use   Smoking status: Former    Packs/day: 1.50    Years: 40.00    Total pack years: 60.00    Types: Cigarettes    Quit date: 05/17/2013    Years since quitting: 8.7   Smokeless tobacco: Never   Tobacco comments:    Patient wears a nicotine patch daily   Vaping Use   Vaping Use: Former  Substance and Sexual Activity   Alcohol use: No    Alcohol/week: 0.0 standard drinks of alcohol   Drug use: No   Sexual activity: Not on file  Other Topics Concern   Not on file  Social History Narrative   Lives at home with wife and stepson    Right handed   Caffeine: 8 oz coke per day    Social Determinants of Health   Financial Resource Strain: Not on file  Food Insecurity: Not on file  Transportation Needs: Not on file  Physical Activity: Not on file  Stress: Not on file  Social Connections: Not on file  Intimate Partner Violence: Not on  file    Mr. Mandile's family history includes Bladder Cancer in his father; Breast cancer in his mother; Diabetes in his father and mother; Hypertension in his brother and father; Irritable bowel syndrome in his father; Migraines in his father; Osteoarthritis in his mother; Other in his brother; Pancreatic cancer in his father; Stroke in his father and mother.      Objective:    Vitals:   02/02/22 1511  BP: 100/60  Pulse: 65  SpO2: 94%    Physical Exam Well-developed well-nourished older white male, accompanied by his wife in no acute distress.  Height, Weight, 169 BMI 27.3  HEENT; nontraumatic normocephalic, EOMI, PE R LA, sclera anicteric. Oropharynx; not examined today Neck; supple, no JVD Cardiovascular; regular rate and rhythm with S1-S2, no murmur rub or gallop Pulmonary; Clear bilaterally Abdomen; soft, nontender, nondistended, no palpable mass or hepatosplenomegaly, bowel sounds are active Rectal; not done today Skin; benign exam, no jaundice rash or appreciable lesions Extremities; no clubbing cyanosis or edema skin warm and dry Neuro/Psych; alert and oriented x4, grossly nonfocal mood and affect appropriate        Assessment & Plan:   #35 66 year old white male here to discuss screening colonoscopy, and EGD in setting of history of chronic GERD. Initially seen by Dr. Ardis Hughs for same in June 2022 and was to be scheduled for colonoscopy and EGD which did not occur as he developed other health issues.  GERD is currently stable on Protonix 40 mg once daily, no dysphagia Patient has mild constipation over the past 6 months no complaints of abdominal pain, melena or hematochezia. Remote colonoscopy about 17 years ago elsewhere-  #2 congestive heart failure-most recent EF 50 to 55% #3 prior history of left ventricular thrombus 2022, no evidence of thrombus on most recent echo 07/2021 but has been maintained on Coumadin due to concerns for increased risk for recurrence  #4  coronary artery disease status post 2 drug-eluting stents November 2022-on Plavix #5 chronic migraine #6 depression 7.  COPD no oxygen use 8.  Polyclonal gammopathy  Plan; patient will be scheduled for colonoscopy and EGD with Dr. Loletha Carrow (in Dr. Eugenia Pancoast absence) in the [facility]. Both procedures were discussed in detail with the patient and his wife including indications risks  and benefits and he is agreeable to proceed.  Discussed that it would be necessary to hold Coumadin and Plavix both for 5 days prior to the procedures.  We will obtain cardiac clearance to hold both Plavix and aspirin prior to the procedures, and communicate with Dr. Irish Lack  I do not think he will require Lovenox bridge but that question will be asked.  Patient will continue Protonix 40 mg p.o. every morning AC breakfast. We discussed considering adding Colace 1-2 at bedtime as a stool softener, and use of MiraLAX 17 g in 8 ounces of water as needed for constipation.  Patient prefers to start his regimen with prunes for the time being.   Loie Jahr S Keghan Mcfarren PA-C 02/02/2022   Cc: Elwyn Reach, MD

## 2022-02-02 NOTE — Patient Instructions (Signed)
You have been scheduled for an endoscopy and colonoscopy. Please follow the written instructions given to you at your visit today. Please pick up your prep supplies at the pharmacy within the next 1-3 days. If you use inhalers (even only as needed), please bring them with you on the day of your procedure.  Please purchase the following medications over the counter and take as directed: Colace 1-2 capsules by mouth at bedtime to soften the stool.  Miralax 17 grams (1 capful) daily for constipation.  _______________________________________________________  If you are age 67 or older, your body mass index should be between 23-30. Your Body mass index is 27.36 kg/m. If this is out of the aforementioned range listed, please consider follow up with your Primary Care Provider.  If you are age 67 or younger, your body mass index should be between 19-25. Your Body mass index is 27.36 kg/m. If this is out of the aformentioned range listed, please consider follow up with your Primary Care Provider.   ________________________________________________________  The [facility] providers would like to encourage you to use [facility] to communicate with providers for non-urgent requests or questions.  Due to long hold times on the telephone, sending your provider a message by [facility] may be a faster and more efficient way to get a response.  Please allow 48 business hours for a response.  Please remember that this is for non-urgent requests.  _______________________________________________________  Due to recent changes in healthcare laws, you may see the results of your imaging and laboratory studies on MyChart before your provider has had a chance to review them.  We understand that in some cases there may be results that are confusing or concerning to you. Not all laboratory results come back in the same time frame and the provider may be waiting for multiple results in order to interpret others.  Please give [place]  48 hours in order for your provider to thoroughly review all the results before contacting the office for clarification of your results.

## 2022-02-02 NOTE — Telephone Encounter (Signed)
Request for surgical clearance:     Endoscopy Procedure  What type of surgery is being performed?     Endoscopy/colonoscopy  When is this surgery scheduled?     02/27/22  What type of clearance is required ?   Pharmacy  Are there any medications that need to be held prior to surgery and how long? Plavix 5 days, Coumadin x 5 days  Practice name and name of physician performing surgery?      [facility]  What is your office phone and fax number?      Phone- [phone]  [phone]  Anesthesia type (None, local, MAC, general) ?       MAC  Does patient need to have lovenox bridge? If so, would you all be able to arrange? Thank you!

## 2022-02-03 ENCOUNTER — Other Ambulatory Visit: Payer: Self-pay | Admitting: Physician Assistant

## 2022-02-03 NOTE — Progress Notes (Signed)
____________________________________________________________  Attending physician addendum:  Thank you for sending this case to me. I have reviewed the entire note and agree with the plan.  We will have to see how his cardiologist feels about the [facility] and plavix management since he is 11 months out from his DES placement.  Wilfrid Lund, MD  ____________________________________________________________

## 2022-02-05 ENCOUNTER — Other Ambulatory Visit: Payer: Self-pay | Admitting: Neurology

## 2022-02-05 ENCOUNTER — Telehealth: Payer: Self-pay

## 2022-02-05 MED ORDER — MEMANTINE HCL 10 MG PO TABS: ORAL_TABLET | ORAL | 0 refills | Status: DC

## 2022-02-05 NOTE — Telephone Encounter (Signed)
Needs PA 

## 2022-02-05 NOTE — Telephone Encounter (Signed)
Patient with diagnosis of apical thrombus on warfarin for anticoagulation.    Procedure: endoscopy/colonoscopy Date of procedure: 02/27/22  Most recent echo (07/2021) notes - Apex akinetic, but thrombus appears to have resolved.  Swirling implies that clot could form again due to low velocity blood flow.   CrCl 78 Platelet count 239  Per office protocol, patient can hold warfarin for 5 days prior to procedure.    Based on echo report (above), would recommend bridging with Lovenox, but will confirm with primary cardiologist  **This guidance is not considered finalized until pre-operative APP has relayed final recommendations.**

## 2022-02-06 NOTE — Telephone Encounter (Signed)
Please clarify what needs a PA, thank you!

## 2022-02-06 NOTE — Telephone Encounter (Signed)
OK to hold  Plavix 5 days, Coumadin x 5 days  JV

## 2022-02-07 ENCOUNTER — Telehealth: Payer: Self-pay | Admitting: Physician Assistant

## 2022-02-07 ENCOUNTER — Telehealth: Payer: Self-pay | Admitting: Neurology

## 2022-02-07 NOTE — Telephone Encounter (Signed)
Pt states it has been a few days since he has been waiting on a refill on his  memantine (NAMENDA) 10 MG tablet, he'd like to know if there is anything that can be done to hurry this process along.  Please call pt

## 2022-02-07 NOTE — Telephone Encounter (Signed)
Looks like a PA is needed. I have completed one on Cover My Meds. Key: [id number]. Awaiting determination from [facility].

## 2022-02-07 NOTE — Telephone Encounter (Signed)
Patient's wife called states patient insurance would not pay for his medication, wondering if an alternative can be send in.

## 2022-02-07 NOTE — Telephone Encounter (Signed)
PA completed on Cover My Meds. Key: [id number]. Awaiting determination from [facility].

## 2022-02-08 NOTE — Telephone Encounter (Signed)
Left message for patient to return my call.

## 2022-02-08 NOTE — Telephone Encounter (Signed)
Patient's wife states her husband has Medicare and they will not pay for Plenvu and wishes to be changed to an alternate prep. Informed [name] that we do have a free sample of Plenvu that she can pick up at the front desk. Patient verbalized understanding and will pick the prep up.

## 2022-02-13 NOTE — Telephone Encounter (Addendum)
   Patient Name: Kyle Walters  DOB: Nov 10, 1955 MRN: [id number]  Primary Cardiologist: Larae Grooms, MD  Chart reviewed as part of pre-operative protocol coverage.  Kyle Walters is on Plavix/Aspirin. Pharmacy clearance only.   Per Dr. Irish Lack - okay to hold Plavix x 5 days and Coumadin x 5 days and resume as soon as safe post procedure at discretion of proceduralist. Per Dr. Irish Lack will not require bridge.  Will route recommendations via MyChart to patient.   I will route this recommendation to the requesting party via Epic fax function and remove from pre-op pool.  Please call with questions.  Loel Dubonnet, NP 02/13/2022, 12:01 PM

## 2022-02-13 NOTE — Telephone Encounter (Signed)
Called and spoke to patient.  Let him know Cardiology approved him to hold Coumadin and Plavix for 5 days prior to procedure, starting on 11-2. (No bridging needed) Patient expressed understanding.  However patient mentioned that someone from our office called him and left a message that we need to move his procedure from 2:30 pm up to 12:00 pm. Patient indicated that he has very bad hemorrhoids and moving the time up concerned him that that would not be enough time between prepping and he wanted to look at another day where he could have his procedure at a later time.  Reschedule patient for Monday, 11-13 at 3:00 pm.  Printed new instructions for patient and sent to him MyChart and mailed to him.  Patient was very appreciative of later appointment on 11-13. Expressed understanding to hold Plavix and Coumadin starting on 11-8.

## 2022-02-19 ENCOUNTER — Telehealth: Payer: Self-pay | Admitting: Interventional Cardiology

## 2022-02-19 NOTE — Telephone Encounter (Signed)
Pt c/o medication issue:  1. Name of Medication: Miralax  2. How are you currently taking this medication (dosage and times per day)?   3. Are you having a reaction (difficulty breathing--STAT)?   4. What is your medication issue? Pt calling to see if this is okay to take with heart medications

## 2022-02-20 NOTE — Telephone Encounter (Signed)
Ok to take

## 2022-02-21 ENCOUNTER — Encounter: Payer: Self-pay | Admitting: Neurology

## 2022-02-21 ENCOUNTER — Ambulatory Visit: Payer: Medicare Other | Admitting: Neurology

## 2022-02-21 ENCOUNTER — Telehealth: Payer: Self-pay | Admitting: Interventional Cardiology

## 2022-02-21 VITALS — BP 129/80 | HR 65 | Ht 67.0 in | Wt 171.0 lb

## 2022-02-21 DIAGNOSIS — G43711 Chronic migraine without aura, intractable, with status migrainosus: Secondary | ICD-10-CM | POA: Diagnosis not present

## 2022-02-21 MED ORDER — EMGALITY 120 MG/ML ~~LOC~~ SOAJ: 120.0000 mg | SUBCUTANEOUS | 11 refills | Status: DC

## 2022-02-21 NOTE — Telephone Encounter (Signed)
Spoke with the patient and advised per PharmD that [facility] was okay for him to take.

## 2022-02-21 NOTE — Telephone Encounter (Signed)
Patient's medication question was reviewed by pharmacy. Per permission on DPR - Detailed message left for patient with instructions that it is ok to continue using Miralax with his other medications.

## 2022-02-21 NOTE — Patient Instructions (Addendum)
Discussed candesartan preventative. Discussed eneura both abortive and preventative. Lasmiditan or Reyvow another medication class abortive Nerivio is now approved for prevention as well dual device Prescribed emgality  Meds ordered this encounter  Medications   Galcanezumab-gnlm (EMGALITY) 120 MG/ML SOAJ    Sig: Inject 120 mg into the skin every 30 (thirty) days.    Dispense:  1.12 mL    Refill:  11     Candesartan Tablets What is this medication? CANDESARTAN (kan des AR tan) treats high blood pressure and heart failure. It works by relaxing blood vessels, which decreases the amount of work the heart has to do. It belongs to a group of medications called ARBs. This medicine may be used for other purposes; ask your health care provider or pharmacist if you have questions. COMMON BRAND NAME(S): Atacand What should I tell my care team before I take this medication? They need to know if you have any of these conditions: Heart failure If you are on a special diet, such as a low salt diet Kidney or liver disease An unusual or allergic reaction to candesartan, other medications, foods, dyes, or preservatives Pregnant or trying to get pregnant Breast-feeding How should I use this medication? Take this medication by mouth. Take it as directed on the prescription label at the same time every day. You can take it with or without food. If it upsets your stomach, take it with food. Keep taking it unless your care team tells you to stop. Talk to your care team about the use of this medication in children. While it may be prescribed for children as Van[place]agoner as 1 for selected conditions, precautions do apply. Overdosage: If you think you have taken too much of this medicine contact a poison control center or emergency room at once. NOTE: This medicine is only for you. Do not share this medicine with others. What if I miss a dose? If you miss a dose, take it as soon as you can. If it is almost time for  your next dose, take only that dose. Do not take double or extra doses. What may interact with this medication? This medication may interact with the following: Blood pressure medications Diuretics, especially triamterene, spironolactone, or amiloride Lithium NSAIDs, medications for pain and inflammation, like ibuprofen or naproxen Potassium salts or potassium supplements This list may not describe all possible interactions. Give your health care provider a list of all the medicines, herbs, non-prescription drugs, or dietary supplements you use. Also tell them if you smoke, drink alcohol, or use illegal drugs. Some items may interact with your medicine. What should I watch for while using this medication? Visit your care team for regular checks on your progress. Check your blood pressure as directed. Ask your care team what your blood pressure should be and when you should contact them. Call your care team if you notice an irregular or fast heartbeat. Women should inform their care team if they wish to become pregnant or think they might be pregnant. There is a potential for serious side effects to an unborn child, particularly in the second or third trimester. Talk to your care team or pharmacist for more information. You may get drowsy or dizzy. Do not drive, use machinery, or do anything that needs mental alertness until you know how this medication affects you. Do not stand or sit up quickly, especially if you are an older patient. This reduces the risk of dizzy or fainting spells. Avoid salt substitutes unless you are told  otherwise by your care team. Do not treat yourself for coughs, colds, or pain while you are taking this medication without asking your care team for advice. Some ingredients may increase your blood pressure. What side effects may I notice from receiving this medication? Side effects that you should report to your care team as soon as possible: Allergic reactions--skin rash,  itching, hives, swelling of the face, lips, tongue, or throat High potassium level--muscle weakness, fast or irregular heartbeat Kidney injury--decrease in the amount of urine, swelling of the ankles, hands, or feet Low blood pressure--dizziness, feeling faint or lightheaded, blurry vision Side effects that usually do not require medical attention (report to your care team if they continue or are bothersome): Back pain Dizziness Fatigue Headache Runny or stuffy nose Sore throat This list may not describe all possible side effects. Call your doctor for medical advice about side effects. You may report side effects to FDA at 1-800-FDA-1088. Where should I keep my medication? Keep out of the reach of children and pets. Store at room temperature below 30 degrees C (86 degrees F). Do not freeze. Throw away any unused medication after the expiration date. NOTE: This sheet is a summary. It may not cover all possible information. If you have questions about this medicine, talk to your doctor, pharmacist, or health care provider.  2023 [employer] Standard (2020-10-27 00:00:00)v

## 2022-02-21 NOTE — Telephone Encounter (Signed)
Yes, should be fine to take.

## 2022-02-21 NOTE — Progress Notes (Addendum)
[facility]    Provider:  Dr  Requesting Provider: Garba, Mohammad L, MD Primary Care Provider:  Garba, Mohammad L, MD  CC:  migraines  02/21/2022: He is using Juva an online biofeedback tool, discussed. Discussed candesartan. Discussed eneura. He is having slight migraine today 3/10 wearing dark glasses. 9 migraine days a month. 18 total headache days a month.Discussed lasmiditan and that class of medication and its mechanism. Try emgality again. Patient complains of symptoms per HPI as well as the following symptoms: migraines . Pertinent negatives and positives per HPI. All others negative   10/18/2021: On qulipta and ubrelvy. Also on botox. Vyepti was canceled. Also on memantine, nerivio,botox, gabapentin, magnesium, lexapro. Medications tried in the past include Emgality(worked great then stopped working, went up to 300 didn't help), Ajovy(Didn't work as well as others), Aimovig, Depakote, gabapentin, Topamax, memantine, Effexor, nortriptyline/TCAs, beta-blockers are contraindicated due to COPD, sumatriptan, Nurtec, aspirin, Lexapro, ketorolac injections, Mobic, Reglan injections, rizatriptan, sumatriptan, zonisamide, vyepti, qulipta, nurtec, ubrelvy and many others over the last 10 years, TMS therapy, Nerivio, cognitive behavioral therapy. Has not tried candesartan but his blood pressure is on the low side and usually this is cost prohibitive or we can't get it approved, been to therapy and psychiatry. Has not tried verapamil but again is blood pressure is on the lower side. He tried Cephaly. VNS was cost prohibitive.Memantine,nerivio,botox,vyepti,magnesium,lexapro, nurtec, somatic tracking and the curable app.  Had Chronic daily headaches and migraines, now 9 a month. He has tried many medications over the years for migraine, concomitant psychiatric hx and PTSD. I have explained I may not be able to help him anymore, we have tried, and he may need to go to an academic  center or [place] neurology for another opinion.  When patient comes, I usually allocate extra time for him in our because he talks a lot about his social stressors, stress at home, things that are going on in his life that may be affecting his migraines.   Today we again talked with him that he may have to get another pair of eyes on him, we can continue providing his Botox. Patient decided he did not want to see anyone else but not sure what else we can do for him.  Vyepti was expensive, when he stopped it there was no change in his migraines. He thinks the qulipta is helping some. The Ubrelvy helps. Nurtec helped for 2 hours but the migraine always rebounded. He was down to 6 migraines a month, in May his wife stopped teaching and his migraines increased due to anger and stress but improved when she started volunteering. He had 9 migraines this month, last about a day or less,nerivio helps, ubrelvy helps, generally able to work that evening so maybe about 12 hours he feels better, somatic tracking and the curable app.the last time he threw up was in 2004. No significant nausea. He does get decreased concentration and imbalance with his migraines which are more debilitating than the pain. Discussed timolol eye drops or propranolol 10mg acutely he will ask his pulmonologist. Zavegepant is also new and coming out.  Patient complains of symptoms per HPI as well as the following symptoms: stress . Pertinent negatives and positives per HPI. All others negative    06/01/2021:  - Medications tried in the past include Emgality(worked great then stopped working, went up to 300 didn't help), Ajovy(Didn't work as well as others), Aimovig, Depakote, gabapentin, Topamax, memantine, Effexor, nortriptyline/TCAs, beta-blockers are contraindicated due to COPD, sumatriptan,   Nurtec, aspirin, Lexapro, ketorolac injections, Mobic, Reglan injections, rizatriptan, sumatriptan, zonisamide, vyepti, qulipta, nurtec, ubrelvy and many  others over the last 10 years, TMS therapy, Nerivio, cognitive behavioral therapy. Has not tried candesartan but his blood pressure is on the low side and usually this is cost prohibitive or we can't get it approved, been to therapy and psychiatry. Has not tried verapamil but again is blood pressure is on the lower side. He tried Cephaly. VNS was cost prohibitive.   Chronic daily headaches and migraines. He has tried many medications over the years for migraine, concomitant psychiatric hx and PTSD. I have explained I may not be able to help him anymore, we have tried, and he may need to go to an academic center or [facility] neurology for another opinion.  When patient comes, I usually allocate extra time for him in our because he talks a lot about his social stressors, stress at home, things that are going on in his life that may be affecting his migraines.   Today we again talked with him that he may have to get another pair of eyes on him, we can continue providing his Botox.   Today decided to: Stop Vyepti, [employer]; Continue Ubrelvy, memantine, nerivio,botox, gabapentin, magnesium, lexapro  Patient here alone and reports that This morning as he was ready to have his oatmeal and blueberries, while at the computer, he had gotten the blueberries, he was having a migraine. He mentioned  he didn't know he had migraines until 10 years ago, he started researching neuroplastic pain and somatic pain,was starting to feel optimistic He uses his nerivio, took [place] and took tylenol. Then something happened that upset him and the migraine worsened and he is not sure if he had a new migraines or an exacerbation of th existing migraine. He is still "out of it" and having pain and has a migraine. He bounces back and forth feeling optimistic, "working my way back from despair", no plans to hurt himself or others. He is feeling very trapped in his marriage. They were seeing a marriage therapist. The therapist was  not giving them good advice, telling his wife she needs to think of them as a couple, they stopped seeing her and marital therapy is covered by his wife's therapy and having a hard time getting liz to remember to schedule a therapist. He has had migraines triggered by exposure to smells, he continues to have migraines exacerbated by interactions with his wife, they will talk about something and agree on what they are going to do then she doesn't do it and never tells him. He says it is extremely difficult being married to someone who is autistic. He is disabled and feels very frustrated by it and he is stuck in this situation. His vyepti may not be helping him and is very expensive. Emgality worked for 6 months. He suffers from agoraphobia and panic attacks.   04/11/2021: 3rd botox. He does not clench and used follow the pain protocol and put extra around the crown and right occipitalis of his head if needed. Marland KitchenHe is on [place] and vyepti. He has tried many medications , concomitant psychiatric hx and PTSD. I have explained I may not be able to help him and he may need to go to an academic center or try [facility]. He has been taking atogepant still even when we started vyepti, we had discussed stopping the atogepant. I am not aware of any contraindications to taking all of  these but I told him I am not sure how many cgrp medictions a patient should take at once. I recommended we try going off of atogepant but he says he is going through a bad patch of migraines and does not want to. I have contacted the Vyepti rep and asked her MSL to follow up on taking Vyepti with atogepant and ubrelvy. I checked Epocrates and there were no interactions in the interaction checker but advised patient I am just not sure if he should be taking qulipta with vyepti but since he has had no side effects we can continue while I ask Vyepti MSL for research.   01/10/2021; second Botox. He has chronic migraines without  aura and those are doing better on botox. Still daily but less severe . He has episodic migraines with aura will prescribe qulipta. We see his wife Elizabeth Geiger. Got the qulipta and ubrelvy approved. The nerivio helps some. He has been using it when having a new headache and the TMS helped some. Dry needling hurts but helps, he knew a PT who performs dry needling and that has helped some too.    01/23/2021: He returns today. We are performing botox on him every 12 weeks. We sent him to TMS therapy. We started qulipta. We also started nerivio. We see his wife Elizabeth Geiger. Got the qulipta and ubrelvy. The nerivio helps some. He has been using it when having a new headache and the TMS helped some. Dry needling hurts but helps, he knew a PT who performs dry needling and that has helped some too. He is having memory problems. He forgets what month they are in. This can go on for months in the setting of headaches. The chronic pain bothers him less than his memory loss which he attributes to chronic pain. He has been on the Qulipta a few months. He was still having migraines on Qulipta but they were better. 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 The emgality was great then stopped being as great, then went up to 300, now back to 200 and no difference may just have stopped working altogether. Ubrelvy and Nerivio have worked well.   HPI:  Kyle Walters is a 66 y.o. male here as requested by Garba, Mohammad L, MD for headaches.  Past medical history panic disorder, COPD, depression, GERD, severe headaches and migraines.  I reviewed Dr. garba's notes: Headaches improved in the past but now they are back. Went to [facility] and had a bad experience.  Patient has tried Imitrex and and has other medical problems including ADHD and anxiety disorder and chronic allergies to fragrance, he continues to have multiple medical problems including allergies, abdominal  discomfort with severe GERD, recurrent nausea, the last time he had a good meaningful relief of his headaches was when he went to the headache clinic at the [facility].  Patient is here alone and reports he has headaches and migraines, has had migraines since his 20s, they became severe about 9 years ago, he is on Botox and takes Emgality cluster headache dose although he denies having cluster headaches.He saw a neurologist at the [facility], then the next neurologist started working for [employer] and was replaced by nurses. He went through a bad migraine cycle in May 30th of last year, he had trigger point injections, they would not do infusions, he cannot take prednisone.He feels he has lasting side effects from prednisone.He was in the   ED and he was given reglan and toradol and his migraine cycle stopped. He has BPH so cannot have compazine or benadryl. We spoke about Tardive Dyskinesias and reglan.Migraines worsened 9 years ago.Smells can trigger severe migraines.Hif wife has autism and extreme anxiety. Finger nail polish always starts a severe migraine.He has developed osmophobia and now many smells "set me off". 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 He is going to try peppermint oil to try and find a masking agent to put on his nose.His migraines causeing a lack of memory, in 2018 he went to [facility].Bright colors also bother him. He has nausea. He still has headaches almost every day, but this is still a significant improvement over prior, in 2018 he had severe daily migraines, now headaches are mild at least 80% improved in severity. He went in last week for reglan and toradol.He struggles with agoraphobia.  Reviewed notes, labs and imaging from outside physicians, which showed:   - Medications tried in the past include Emgality(worked great then stopped working, went up to 300 didn't help),  Ajovy(Didn't work as well as others), Aimovig, Depakote, gabapentin, Topamax, memantine, Effexor, nortriptyline/TCAs, beta-blockers are contraindicated due to COPD, sumatriptan, Nurtec, aspirin, Lexapro, ketorolac injections, Mobic, Reglan injections, rizatriptan, sumatriptan, zonisamide, vyepti, qulipta, nurtec, ubrelvy and many others over the last 10 years, [place] therapy, Nerivio, cognitive behavioral therapy. Has not tried candesartan but his blood pressure is on the low side and usually this is cost prohibitive or we can't get it approved, been to therapy and psychiatry. Has not tried verapamil but again is blood pressure is on the lower side. He tried Cephaly. VNS was cost prohibitive.  I reviewed chart patient's been seen at the [facility] emergency room twice in the last several months, on June 11, 2020 he presented with headache and on July 05, 2020 he presented with migraine and headache.  I reviewed these records: In February he stated migraine started after triggered by a strong scent, history of neck spasms with the migraine, severe, with aura of visual loss and a small slice in his right visual field, on a "chronic migraine cycle" since last June receiving Botox injections from his neurologist, received relief after migraine cocktail.  He presented in March with headache for 6 days, throbbing, typical for his migraines no relief with current medications.  Again felt better after migraine cocktail and was discharged home.  I also reviewed [facility]'s notes, history of migraines that started in his late 52s, progressed to chronic migraines in 2012 with possible cluster headache, comorbid anxiety, depression, PTSD, panic attacks, ADD and COPD.  Appears she tried Ajovy and earlier this month switch back to [facility] which was effective previously.  Blood work collected December 29, 2019 shows a CMP with BUN 14 and creatinine 1.28 otherwise unremarkable,  CT head 06/11/2020: showed  No acute intracranial abnormalities including mass lesion or mass effect, hydrocephalus, extra-axial fluid collection, midline shift, hemorrhage, or acute infarction, large ischemic events (personally reviewed images)  Review of Systems: Patient complains of symptoms per HPI as well as the following symptoms: panic attacks, social stressors . Pertinent negatives and positives per HPI. All others negative     Social History   Socioeconomic History   Marital status: Married    Spouse name: [name]   Number of children: 1   Years of education: 18   Highest education level: Master's degree (e.g., MA, MS, MEng, MEd,  MSW, MBA)  Occupational History   Occupation: disabled  Tobacco Use   Smoking status: Former    Packs/day: 1.50    Years: 40.00    Total pack years: 60.00    Types: Cigarettes    Quit date: 05/17/2013    Years since quitting: 8.7   Smokeless tobacco: Never   Tobacco comments:    Patient wears a nicotine patch daily   Vaping Use   Vaping Use: Former  Substance and Sexual Activity   Alcohol use: No    Alcohol/week: 0.0 standard drinks of alcohol   Drug use: No   Sexual activity: Not on file  Other Topics Concern   Not on file  Social History Narrative   Lives at home with wife and stepson    Right handed   Caffeine: 8 oz coke per day    Social Determinants of Health   Financial Resource Strain: Not on file  Food Insecurity: Not on file  Transportation Needs: Not on file  Physical Activity: Not on file  Stress: Not on file  Social Connections: Not on file  Intimate Partner Violence: Not on file    Family History  Problem Relation Age of Onset   Breast cancer Mother    Diabetes Mother    Osteoarthritis Mother    Stroke Mother    Bladder Cancer Father    Diabetes Father    Stroke Father    Hypertension Father    Pancreatic cancer Father    Migraines Father    Irritable bowel syndrome Father    Hypertension Brother    Other Brother        stomach ulcer    Colon cancer Neg Hx    Rectal cancer Neg Hx    Esophageal cancer Neg Hx     Past Medical History:  Diagnosis Date   ADHD (attention deficit hyperactivity disorder)    Anemia    Anxiety panic attack   Arthritis    COPD (chronic obstructive pulmonary disease) ([place])    bronchitis/emphysema   Coronary artery disease    Depression    Emphysema lung ([employer])    Enlarged prostate    Fibromyalgia    GERD (gastroesophageal reflux disease)    Hyperlipidemia    Hypertension    IBS (irritable bowel syndrome)    Migraine    Panic attack     Patient Active Problem List   Diagnosis Date Noted   History of tobacco use 09/07/2021   Encounter for therapeutic drug monitoring 04/03/2021   Thrombus in heart chamber 04/03/2021   Hyperlipidemia 03/14/2021   Coronary artery disease    Chronic migraine without aura, with intractable migraine, so stated, with status migrainosus 07/14/2020   Severe episode of recurrent major depressive disorder, without psychotic features ([place]) 07/14/2020   Polyclonal gammopathy determined by serum protein electrophoresis 02/25/2018   Primary osteoarthritis of both feet 12/13/2017   History of BPH 12/13/2017   Family history of psoriasis in mother 12/13/2017   History of gastroesophageal reflux (GERD) 12/13/2017   Anxiety and depression 12/13/2017   COPD (chronic obstructive pulmonary disease) ([place]) 01/29/2013   CHEST PAIN-PRECORDIAL 11/01/2009   Primary osteoarthritis of both hands 10/12/2009   CHEST PAIN 10/12/2009    Past Surgical History:  Procedure Laterality Date   CARDIAC CATHETERIZATION     CORONARY STENT INTERVENTION N/A 03/14/2021   Procedure: CORONARY STENT INTERVENTION;  Surgeon: Jettie Booze, MD;  Location: [facility];  Service: Cardiovascular;  Laterality: N/A;  LEFT HEART CATH AND CORONARY ANGIOGRAPHY N/A 03/14/2021   Procedure: LEFT HEART CATH AND CORONARY ANGIOGRAPHY;  Surgeon: Varanasi, Jayadeep S, MD;  Location: [facility]  INVASIVE [facility];  Service: Cardiovascular;  Laterality: N/A;   NO PAST SURGERIES      Current Outpatient Medications  Medication Sig Dispense Refill   acetaminophen (TYLENOL) 500 MG tablet Take 1,000 mg by mouth every 6 (six) hours as needed for mild pain.     albuterol (VENTOLIN HFA) 108 (90 Base) MCG/ACT inhaler Inhale 2 puffs into the lungs every 6 (six) hours as needed for wheezing or shortness of breath. 8 g 2   ALPRAZolam (XANAX) 1 MG tablet Take 1 mg by mouth 4 (four) times daily.      Atogepant (QULIPTA) 60 MG TABS Take by mouth daily.     B Complex-Biotin-FA (B-50 COMPLEX PO) Take 1 capsule by mouth daily.     Botulinum Toxin Type A (BOTOX) 200 units SOLR Provider to inject 155 units into the muscles of the head and neck every 3 months. Discard remainder. 1 each 3   Cholecalciferol (VITAMIN D3) 2000 UNITS TABS Take 4,000 Units by mouth daily after breakfast.     clopidogrel (PLAVIX) 75 MG tablet Take 1 tablet (75 mg total) by mouth daily with breakfast. 90 tablet 3   doxazosin (CARDURA) 8 MG tablet Take 8 mg by mouth daily.     escitalopram (LEXAPRO) 20 MG tablet Take 10 mg by mouth in the morning.  2   Ferrous Sulfate (IRON PO) Take 65 mg by mouth daily after breakfast. Hema Plex Iron     fluticasone furoate-vilanterol (BREO ELLIPTA) 100-25 MCG/ACT AEPB Inhale 1 puff into the lungs daily. 60 each 2   gabapentin (NEURONTIN) 400 MG capsule Take 400 mg by mouth 4 (four) times daily.      Galcanezumab-gnlm (EMGALITY) 120 MG/ML SOAJ Inject 120 mg into the skin every 30 (thirty) days. 1.12 mL 11   MAGNESIUM MALATE PO Take 400 mg by mouth daily after breakfast.     memantine (NAMENDA) 10 MG tablet TAKE 2 TABLETS BY MOUTH IN THE MORNING AND 1 TABLET IN THE LATE AFTERNOON 90 tablet 0   Multiple Vitamin (MULTIVITAMIN WITH MINERALS) TABS tablet Take 1 tablet by mouth daily after breakfast. Centrum Silver for Men     Nerve Stimulator (NERIVIO) DEVI USE WITHIN 1 HR OF MIGRAINE. SET A STRONG YET  COMFORTABLE INTENSITY LEVEL & MAINTAIN LEVEL FOR 45 MINS 1 each 12   nitroGLYCERIN (NITROSTAT) 0.4 MG SL tablet Place 1 tablet (0.4 mg total) under the tongue every 5 (five) minutes as needed. (Patient taking differently: Place 0.4 mg under the tongue every 5 (five) minutes as needed for chest pain.) 25 tablet 2   Omega-3 Fatty Acids (FISH OIL) 1200 MG CAPS Take 1,200 mg by mouth daily after breakfast.     pantoprazole (PROTONIX) 20 MG tablet Take 1 tablet (20 mg total) by mouth daily. 90 tablet 1   PEG-KCl-NaCl-NaSulf-Na Asc-C (PLENVU) 140 g SOLR Take 1 kit by mouth as directed. Use coupon: BIN: [id number] PNC: CNRX Group: [id number] ID: [id number] 1 each 0   phenylephrine-shark liver oil-mineral oil-petrolatum (PREPARATION H) 0.25-14-74.9 % rectal ointment Place 1 application rectally 2 (two) times daily as needed for hemorrhoids.     rosuvastatin (CRESTOR) 20 MG tablet TAKE 1 TABLET BY MOUTH EVERY DAY 90 tablet 3   silodosin (RAPAFLO) 8 MG CAPS capsule Take 8 mg by mouth daily with breakfast.       Ubrogepant (UBRELVY) 100 MG TABS TAKE 1 TABLET MOUTH AS NEEDED AS CLOSE TO MIGRAINE ONSET AS POSSIBLE. MAY REPEAT IN 2 HOURS IF NEEDED. MAX 200 MG PER 24 HOURS. (Patient taking differently: Take 100 mg by mouth as directed. Take 1 tablet as close to migraine onset as possible. May repeat in 2 hours if needed, max dose 200 mg /daily.) 16 tablet 11   vitamin B-12 (CYANOCOBALAMIN) 500 MCG tablet Take 500 mcg by mouth daily.     warfarin (COUMADIN) 5 MG tablet TAKE 1/2 TO 1 TABLET BY MOUTH ONCE DAILY AS DIRECTED BY ANTICOAGULATION CLINIC 90 tablet 1   No current facility-administered medications for this visit.    Allergies as of 02/21/2022 - Review Complete 02/21/2022  Allergen Reaction Noted   Tetracycline Anaphylaxis and Other (See Comments) 11/30/2015   Triptans  10/19/2020   Effexor [venlafaxine hcl] Rash 08/24/2011   Prednisone Other (See Comments) 11/15/2017   Sulfamethoxazole-trimethoprim Nausea  Only 08/24/2011    Vitals: BP 129/80   Pulse 65   Ht 5' 7" (1.702 m)   Wt 171 lb (77.6 kg)   BMI 26.78 kg/m  Last Weight:  Wt Readings from Last 1 Encounters:  02/21/22 171 lb (77.6 kg)   Last Height:   Ht Readings from Last 1 Encounters:  02/21/22 5' 7" (1.702 m)     Physical exam: Exam: Gen: NAD, conversant      CV: No apparent palpitations or chest pain or SOB. VS: Breathing at a normal rate. Weight appears within normal limits. Not febrile. Eyes: Conjunctivae clear without exudates or hemorrhage  Neuro: Detailed Neurologic Exam  Speech:    Speech is normal; fluent and spontaneous with normal comprehension.  Cognition:    The patient is oriented to person, place, and time;     recent and remote memory intact;     language fluent;     normal attention, concentration,     fund of knowledge Cranial Nerves:    The pupils are equal, round, and reactive to light.  Visual fields are full to finger confrontation. Extraocular movements are intact.  The face is symmetric with normal sensation. The palate elevates in the midline. Hearing intact. Voice is normal. Shoulder shrug is normal. The tongue has normal motion without fasciculations.   Coordination:    Normal finger to nose  Gait:    Normal native gait  Motor Observation:   no involuntary movements noted. Tone:    Appears normal  Posture:    Posture is normal. normal erect    Strength:    Strength is anti-gravity and symmetric in the upper and lower limbs.      Sensation: intact to LT      Assessment/Plan:  65 year old intractable migrain patient.. He has a long history, been to multiple neurologists, has tried many medications. I had a long discussion with him again, I am not sure that I have anymore answers for him than previous neurologists and I recommend another pair of eyes maybe [facility] [facility]. We can continue his botox therapy here and we tried TMS for his intractable migraines and refractory  depression. Also tried nerivio. Discussed other options, medical devices (he tried cefaly, he knows about VNS stimulator but financially not feasible). When patient comes I usually allocate extra time for himbecause he talks a lot about his social stressors, stress at home, things that are going on in his life that may be affecting his migraines, I have tried to express to him that   unfortunately I am not skilled as a therapist, of course I want to know what is going on but there comes a point where I may not be able to help him.   Discussed candesartan preventative. Discussed eneura both abortive and preventative. Lasmiditan or Reyvow another medication class abortive Nerivio is now approved for prevention as well dual device Prescribed emgality today still having 8-9 migraine days and 18 headache days a month  On qulipta and ubrelvy. Also on botox. Vyepti was canceled. Also on memantine, nerivio,botox, gabapentin, magnesium, lexapro. Medications tried in the past include Emgality(worked great then stopped working, went up to 300 didn't help), Ajovy(Didn't work as well as others), Aimovig, Depakote, gabapentin, Topamax, memantine, Effexor, nortriptyline/TCAs, beta-blockers are contraindicated due to COPD, sumatriptan, Nurtec, aspirin, Lexapro, ketorolac injections, Mobic, Reglan injections, rizatriptan, sumatriptan, zonisamide, vyepti, qulipta, nurtec, ubrelvy and many others over the last 10 years, TMS therapy, Nerivio, cognitive behavioral therapy. Has not tried candesartan but his blood pressure is on the low side and usually this is cost prohibitive or we can't get it approved, been to therapy and psychiatry. Has not tried verapamil but again is blood pressure is on the lower side. He tried Cephaly. VNS was cost prohibitive.Memantine,nerivio,botox,vyepti,magnesium,lexapro, nurtec, somatic tracking and the curable app.  - Has also seen Rheumatology and had extensive workup in 2019. Been to ENT  02/2021.Regularly sees cardiology and primary care.Also follows with pulmonology last time 09/28/2020.  - Also had CT head 05/2020, 02/2016, 11/2014. MRI brain 09/2011. No etiology for headaches found  - denies symptoms of OSA   - Been to [facility] Dr. Hagan, [facility] Dr. , Dr. Lewitt headache specialist, [facility]  - Have been up front, I really don't have a lot more ideas for him, he needs an academic center or [facility] for his headache that will definitely have more resources and/or clinical trials than we do.He has decided to stay with us, we can keep trying to think of other options  - stop vyepti, filled out forms for abbvie patient assistance for ubrelvy and qulipta -We are performing botox on him every 12 weeks. -We sent him to TMS therapy. Didn't really help. - We also started nerivio. He tried Cephaly. VNS was cost prohibitive, discussed eneura - discussed spravato , would need to see psychiatry for this -. The nerivio helps some. He has been using it when having a new headache and the TMS helped some.  - Dry needling hurts but helps - recommended biofeedback - he is using an app on his phone now - recommended cognitive behavioral therapy - recommended psychiatry and therapist - Maybe [facility] has clinical trials? - Encouraged healthy lifestyle, exercise - can continue qulipta however I have warned him about the unkowns of so many cgrp antagonists he insists and understands the risks of which many are unknown including vasoconstriction which I explanied to him as far as the sequlae (stroke, coronary spasm etc)  Discussed: To prevent or relieve headaches, try the following: Cool Compress. Lie down and place a cool compress on your head.  Avoid headache triggers. If certain foods or odors seem to have triggered your migraines in the past, avoid them. A headache diary might help you identify triggers.  Include physical activity in  your daily routine. Try a daily walk or other moderate aerobic exercise.  Manage stress. Find healthy ways to cope with the stressors, such as delegating tasks on your to-do list.  Practice relaxation techniques. Try deep breathing, yoga,   massage and visualization.  Eat regularly. Eating regularly scheduled meals and maintaining a healthy diet might help prevent headaches. Also, drink plenty of fluids.  Follow a regular sleep schedule. Sleep deprivation might contribute to headaches Consider biofeedback. With this mind-body technique, you learn to control certain bodily functions -- such as muscle tension, heart rate and blood pressure -- to prevent headaches or reduce headache pain.    Proceed to emergency room if you experience new or worsening symptoms or symptoms do not resolve, if you have new neurologic symptoms or if headache is severe, or for any concerning symptom.   Provided education and documentation from American headache [employer] toolbox including articles on: chronic migraine medication overuse headache, chronic migraines, prevention of migraines, behavioral and other nonpharmacologic treatments for headache.  Meds ordered this encounter  Medications   Galcanezumab-gnlm (EMGALITY) 120 MG/ML SOAJ    Sig: Inject 120 mg into the skin every 30 (thirty) days.    Dispense:  1.12 mL    Refill:  11   No orders of the defined types were placed in this encounter.    I spent over 40 minutes of face-to-face and non-face-to-face time with patient on the  1. Chronic migraine without aura, with intractable migraine, so stated, with status migrainosus      diagnosis.  This included previsit chart review, lab review, study review, order entry, electronic health record documentation, patient education on the different diagnostic and therapeutic options, counseling and coordination of care, risks and benefits of management, compliance, or risk factor reduction   Cc: Elwyn Reach, MD,    Sarina Ill, MD  [facility] [address] [place], [place] [address]  Phone [phone] Fax [phone]

## 2022-02-21 NOTE — Telephone Encounter (Signed)
Pt c/o medication issue:  1. Name of Medication: Emgality   2. How are you currently taking this medication (dosage and times per day)?   Pt states he would take '240mg'$  1 month. Then the next month and so forth he would take '120mg'$    3. Are you having a reaction (difficulty breathing--STAT)?   4. What is your medication issue? Pt calling wanting to know if its okay for him to take this medication for his migraines

## 2022-02-22 ENCOUNTER — Encounter: Payer: Self-pay | Admitting: Neurology

## 2022-02-22 ENCOUNTER — Other Ambulatory Visit: Payer: Self-pay | Admitting: *Deleted

## 2022-02-22 MED ORDER — EMGALITY 120 MG/ML ~~LOC~~ SOAJ: 120.0000 mg | SUBCUTANEOUS | 11 refills | Status: DC

## 2022-02-26 ENCOUNTER — Telehealth: Payer: Self-pay | Admitting: Interventional Cardiology

## 2022-02-26 ENCOUNTER — Encounter: Payer: Self-pay | Admitting: Gastroenterology

## 2022-02-26 NOTE — Telephone Encounter (Signed)
Patient states his wife tested positive for COVID and although he has not tested positive he would like to know if he also tests positive, would it be alright to take Paxlovid with other cardiac medications? Please advise.

## 2022-02-27 ENCOUNTER — Encounter: Payer: Medicare Other | Admitting: Gastroenterology

## 2022-02-27 NOTE — Telephone Encounter (Signed)
I spoke with patient and gave him information from pharmacist.  Message with this information sent to patient through my chart

## 2022-02-27 NOTE — Telephone Encounter (Signed)
Paxlovid will have interactions with his Rapaflo, Crestor, clopidogrel, and Qulipta. Recommend he contact office if he is prescribed Paxlovid and we can give recommendations.  Suggest he request Lagevrio if he needs Covid anti virals.

## 2022-02-27 NOTE — Telephone Encounter (Signed)
Patient called to reschedule Endo/Colon requesting new prep instructions

## 2022-02-28 NOTE — Telephone Encounter (Signed)
New prep instructions send to patient through [place].

## 2022-03-05 ENCOUNTER — Encounter: Payer: Medicare Other | Admitting: Gastroenterology

## 2022-03-09 DIAGNOSIS — R590 Localized enlarged lymph nodes: Secondary | ICD-10-CM | POA: Diagnosis not present

## 2022-03-09 DIAGNOSIS — G43909 Migraine, unspecified, not intractable, without status migrainosus: Secondary | ICD-10-CM | POA: Diagnosis not present

## 2022-03-14 NOTE — Telephone Encounter (Signed)
Completed PA w/ [employer] [facility] Part D on CMM. Key: [id number].   Request Reference Number: [id number]. BOTOX INJ 200UNIT is approved through 06/14/2022. Your patient may now fill this prescription and it will be covered.

## 2022-03-20 IMAGING — CT CT HEAD W/O CM
3 series · 15 of 47 positions shown, 18 images · non-contrast
Comparison: 03/22/2016 CT and prior studies

CLINICAL DATA: 64-year-old male with acute headache and blurred
vision for 3 days.

EXAM:
CT HEAD WITHOUT CONTRAST
TECHNIQUE: Contiguous axial images were obtained from the base of the skull
through the vertex without intravenous contrast.

[Series 2: head wo · axial · 0.47mm/px · z∈[+1602,+1752]mm · 9 of 36 slices shown, 12 images]
[im 3/36  brain]
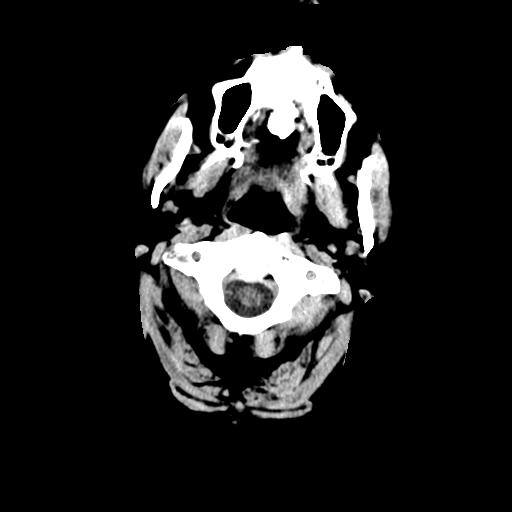
[im 3/36  bone]
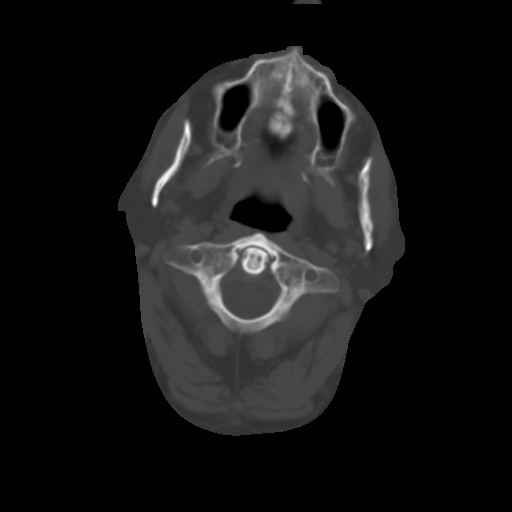
[im 7/36  brain]
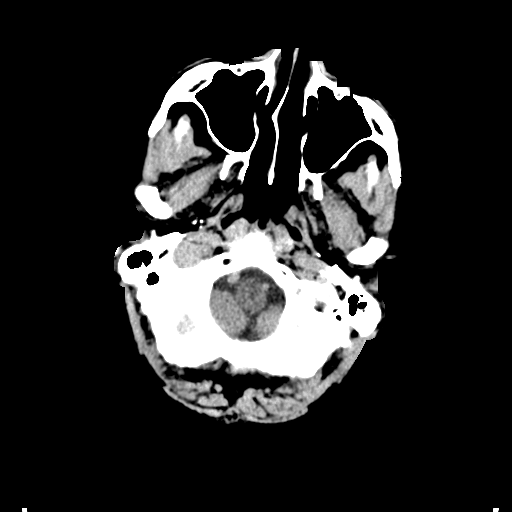
[im 10/36  brain]
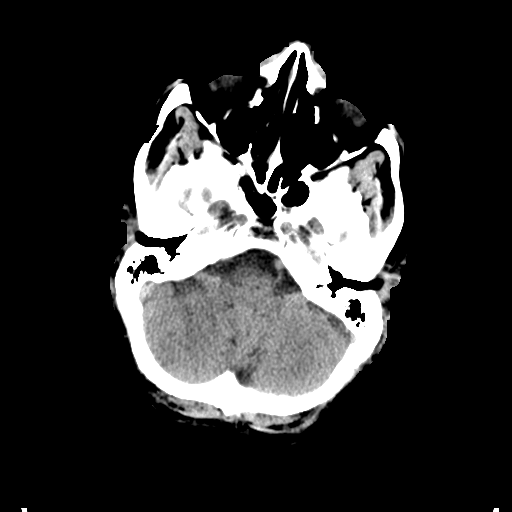
[im 14/36  brain]
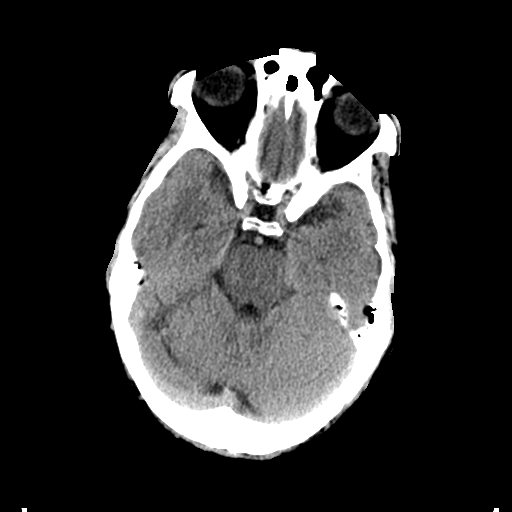
[im 19/36  brain]
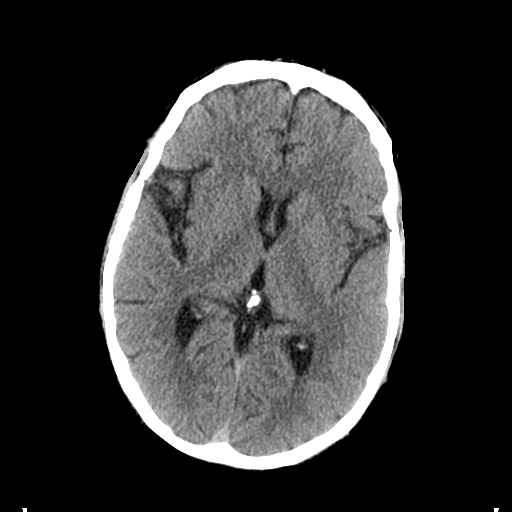
[im 19/36  bone]
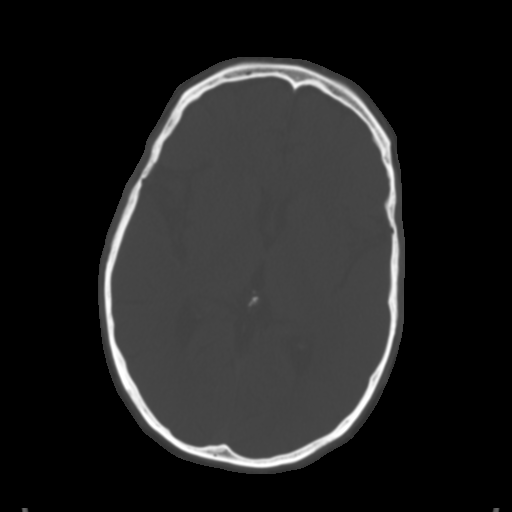
[im 22/36  brain]
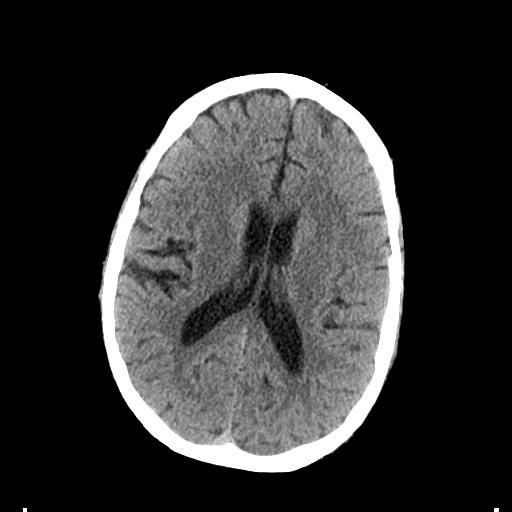
[im 26/36  brain]
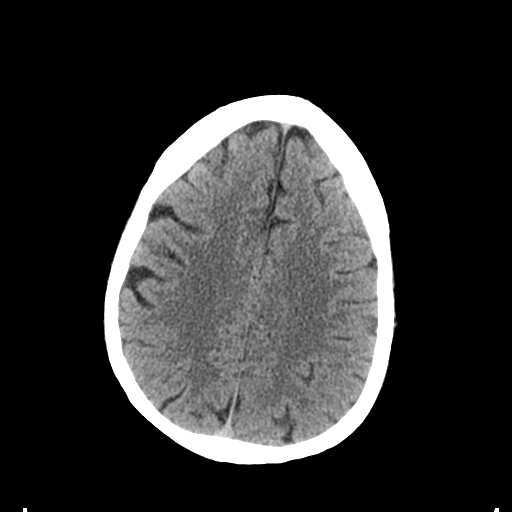
[im 29/36  brain]
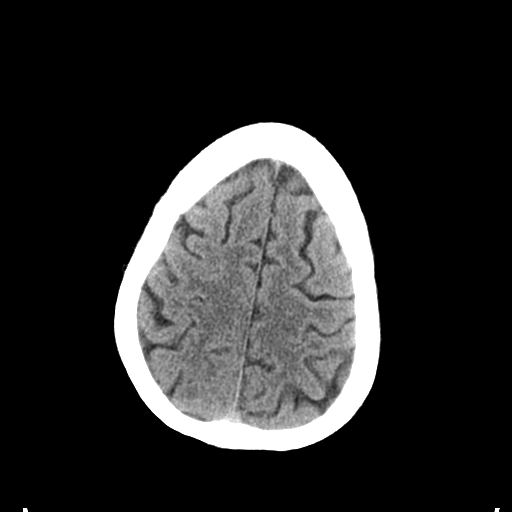
[im 33/36  brain]
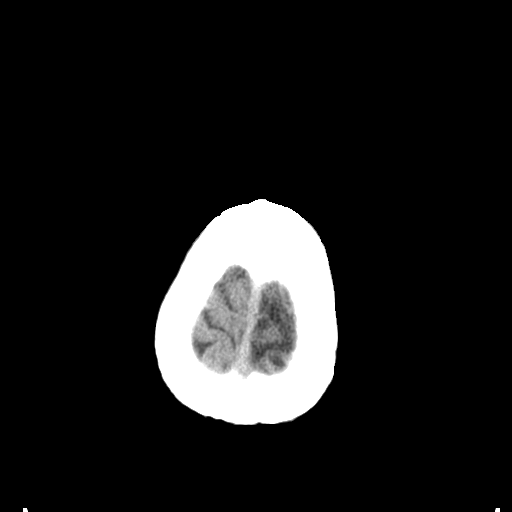
[im 33/36  bone]
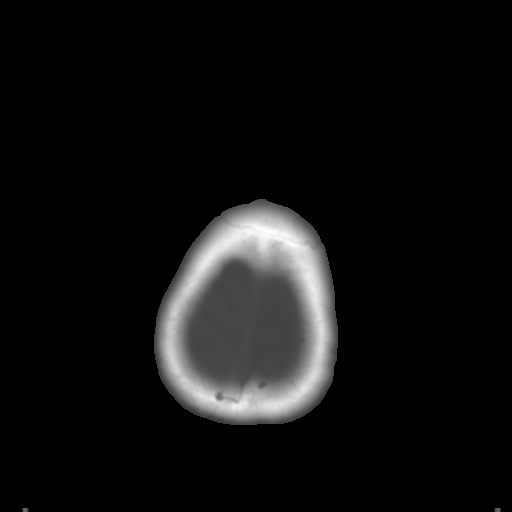

[Series 5: coronal soft tissue · coronal · 0.34mm/px · 3 of 72 slices shown]
[im 24/72  brain]
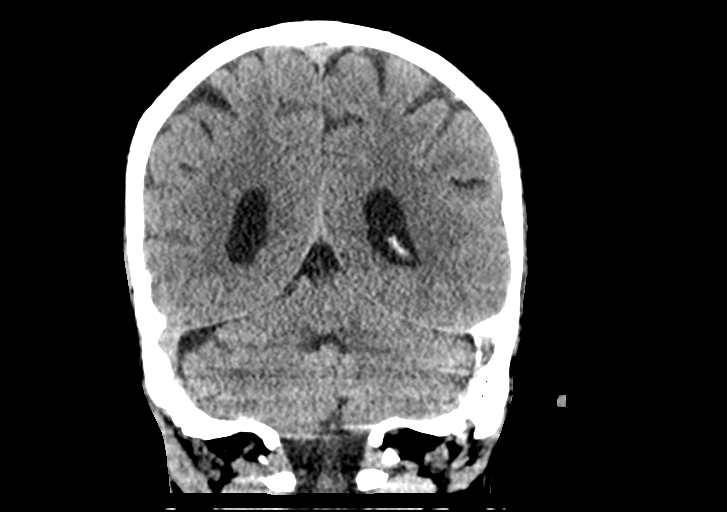
[im 32/72  brain]
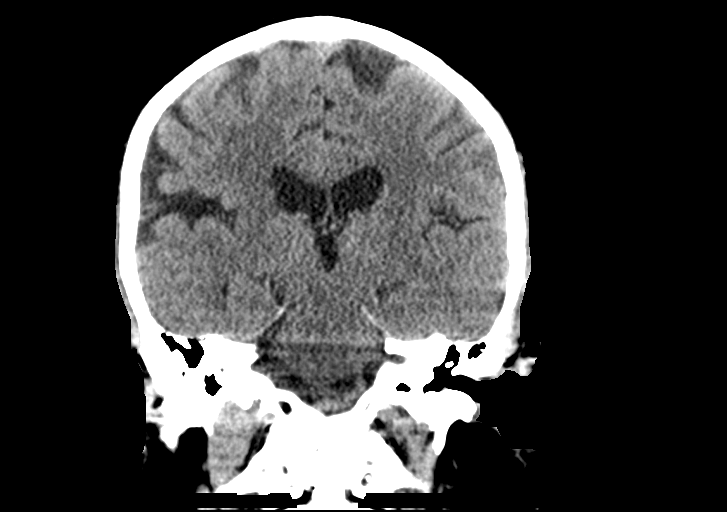
[im 40/72  brain]
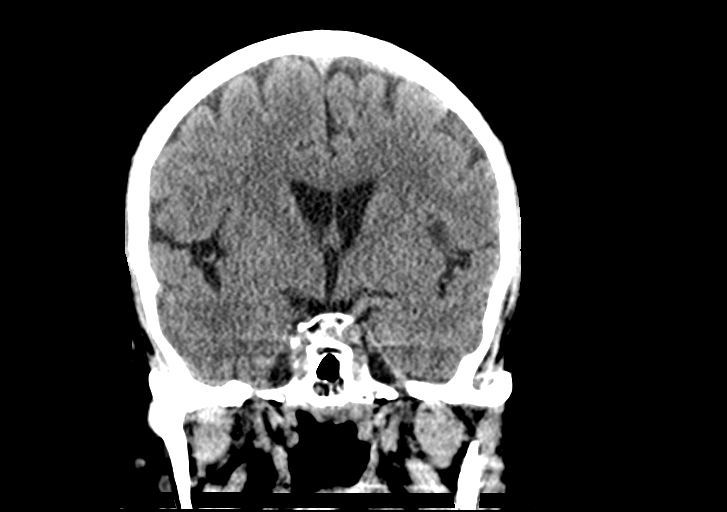

[Series 6: sagittal soft tissue · sagittal · 0.37mm/px · 3 of 56 slices shown]
[im 19/56  brain]
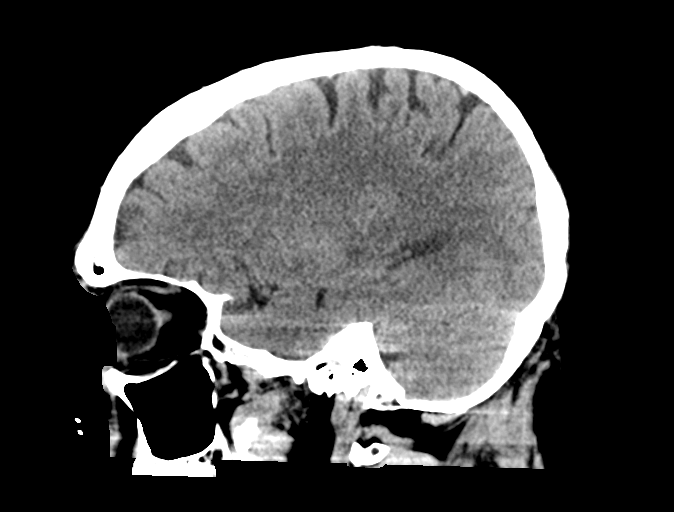
[im 28/56  brain]
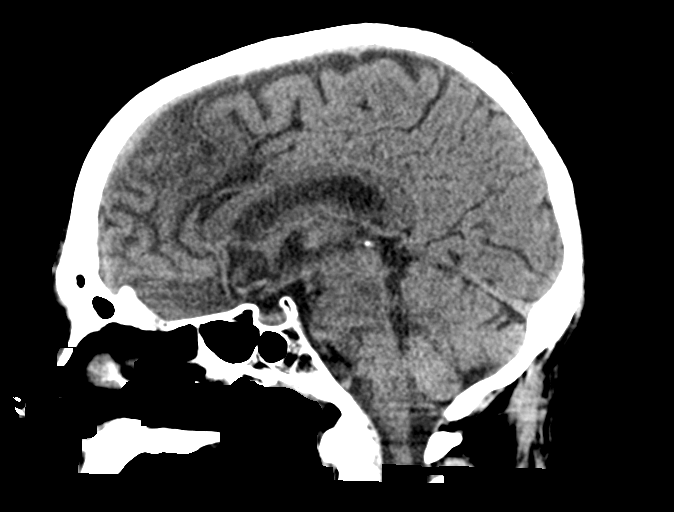
[im 37/56  brain]
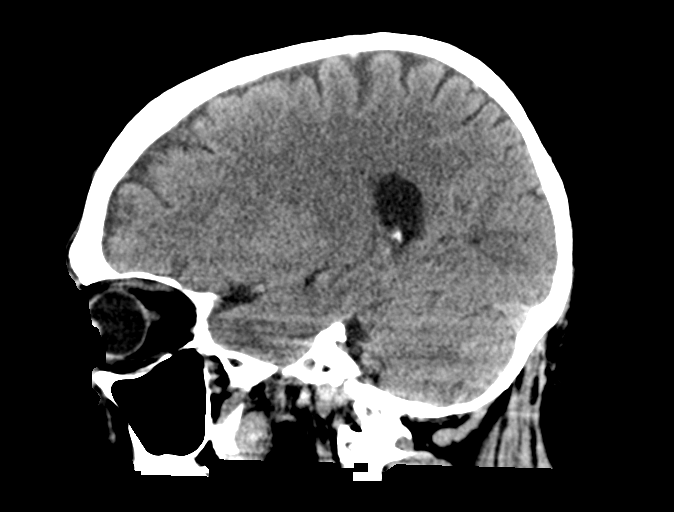

[15 of 47 positions shown; findings below may reference images not displayed]

FINDINGS: Brain: No evidence of acute infarction, hemorrhage, hydrocephalus,
extra-axial collection or mass lesion/mass effect.

Vascular: No hyperdense vessel or unexpected calcification.

Skull: Normal. Negative for fracture or focal lesion.

Sinuses/Orbits: No acute finding.

Other: None.
IMPRESSION: Unremarkable noncontrast head CT.

## 2022-03-27 ENCOUNTER — Encounter: Payer: Self-pay | Admitting: General Practice

## 2022-03-27 ENCOUNTER — Other Ambulatory Visit (HOSPITAL_COMMUNITY): Payer: Self-pay | Admitting: Internal Medicine

## 2022-03-27 DIAGNOSIS — R591 Generalized enlarged lymph nodes: Secondary | ICD-10-CM

## 2022-03-27 NOTE — Progress Notes (Signed)
Greggory Keen, MD  Allen Kell, NT; Elwyn Reach, MD No imaging that shows any adenopathy that I can find  TS       Previous Messages    ----- Message ----- From: Allen Kell, NT Sent: 03/27/2022   2:44 PM EST To: Ir Procedure Requests Subject: US Biopsy Lymph Nodes                          Procedure: US Biopsy lymph nodes  Reason: Lymphadenopathy  History: CT in chart  Provider: Elwyn Reach, MD  Contact: [phone]

## 2022-03-28 ENCOUNTER — Other Ambulatory Visit: Payer: Self-pay | Admitting: Emergency Medicine

## 2022-04-02 ENCOUNTER — Ambulatory Visit (AMBULATORY_SURGERY_CENTER): Payer: Medicare Other | Admitting: Gastroenterology

## 2022-04-02 ENCOUNTER — Encounter: Payer: Self-pay | Admitting: Gastroenterology

## 2022-04-02 VITALS — BP 134/50 | HR 50 | Temp 97.1°F | Resp 14 | Ht 66.0 in | Wt 169.0 lb

## 2022-04-02 DIAGNOSIS — D123 Benign neoplasm of transverse colon: Secondary | ICD-10-CM | POA: Diagnosis not present

## 2022-04-02 DIAGNOSIS — K317 Polyp of stomach and duodenum: Secondary | ICD-10-CM | POA: Diagnosis not present

## 2022-04-02 DIAGNOSIS — Z1211 Encounter for screening for malignant neoplasm of colon: Secondary | ICD-10-CM

## 2022-04-02 DIAGNOSIS — J449 Chronic obstructive pulmonary disease, unspecified: Secondary | ICD-10-CM | POA: Diagnosis not present

## 2022-04-02 DIAGNOSIS — F32A Depression, unspecified: Secondary | ICD-10-CM | POA: Diagnosis not present

## 2022-04-02 DIAGNOSIS — K219 Gastro-esophageal reflux disease without esophagitis: Secondary | ICD-10-CM

## 2022-04-02 DIAGNOSIS — I10 Essential (primary) hypertension: Secondary | ICD-10-CM | POA: Diagnosis not present

## 2022-04-02 DIAGNOSIS — I2510 Atherosclerotic heart disease of native coronary artery without angina pectoris: Secondary | ICD-10-CM | POA: Diagnosis not present

## 2022-04-02 MED ORDER — SODIUM CHLORIDE 0.9 % IV SOLN: 500.0000 mL | Freq: Once | INTRAVENOUS | Status: DC

## 2022-04-02 NOTE — Progress Notes (Signed)
   Established Patient Office Visit  Subjective   Patient ID: Kyle Walters, male    DOB: Jan 17, 1956  Age: 66 y.o. MRN: [id number]  Chief Complaint  Patient presents with   Colonoscopy    GERD/Screening colonoscopy-[facility]   Endoscopy    HPI    ROS    Objective:     BP 125/65   Pulse (!) 50   Temp (!) 97.1 F (36.2 C)   Resp 13   Ht '5\' 6"'$  (1.676 m)   Wt 169 lb (76.7 kg)   SpO2 100%   BMI 27.28 kg/m    Physical Exam   No results found for any visits on 04/02/22.    The ASCVD Risk score (Arnett DK, et al., 2019) failed to calculate for the following reasons:   The patient has a prior MI or stroke diagnosis    Assessment & Plan:   Problem List Items Addressed This Visit   None Visit Diagnoses     Colon cancer screening    -  Primary   Relevant Medications   0.9 %  sodium chloride infusion   Other Relevant Orders   Diet NPO   Hypoglycemia/Hyperglycemia protocol   Emergency Medications   ECG and pulse monitoring    Monitor NIBP   Notify physician   Monitor O2 SATs   NPO status   Vital signs   Monitor NIBP, ECG and PSA02   Notify physician   Discharge home with responsible adult.   Discharge instructions   Esophagogastroduodenoscopy: verify informed consent   Esophageal dilation: verify informed consent   Colonoscopy: verify informed consent   Surgical pathology ([facility])   Chronic GERD       Relevant Medications   0.9 %  sodium chloride infusion   Other Relevant Orders   Diet NPO   Hypoglycemia/Hyperglycemia protocol   Emergency Medications   ECG and pulse monitoring    Monitor NIBP   Notify physician   Monitor O2 SATs   NPO status   Vital signs   Monitor NIBP, ECG and PSA02   Notify physician   Discharge home with responsible adult.   Discharge instructions   Esophagogastroduodenoscopy: verify informed consent   Esophageal dilation: verify informed consent   Colonoscopy: verify informed consent   Surgical pathology ([facility])   Benign neoplasm of transverse colon       Relevant Orders   Surgical pathology ([facility])       No follow-ups on file.    Gershon Crane, CRNA

## 2022-04-02 NOTE — Progress Notes (Signed)
Called to room to assist during endoscopic procedure.  Patient ID and intended procedure confirmed with present staff. Received instructions for my participation in the procedure from the performing physician.  

## 2022-04-02 NOTE — Op Note (Signed)
[name] Procedure Date: 04/02/2022 3:09 PM MRN: [id number] Endoscopist: Kerr. Loletha Carrow , MD, [id number] Age: 66 Referring MD:  Date of Birth: [birth year] Gender: Male Account #: [id number] Procedure:                Colonoscopy Indications:              Screening for colorectal malignant neoplasm                           no polyps reported on last colonoscopy > 10 years                            ago Medicines:                Monitored Anesthesia Care Procedure:                Pre-Anesthesia Assessment:                           - Prior to the procedure, a History and Physical                            was performed, and patient medications and                            allergies were reviewed. The patient's tolerance of                            previous anesthesia was also reviewed. The risks                            and benefits of the procedure and the sedation                            options and risks were discussed with the patient.                            All questions were answered, and informed consent                            was obtained. Prior Anticoagulants: The patient                            last took Coumadin (warfarin) 5 days and Plavix                            (clopidogrel) 5 days prior to the procedure. ASA                            Grade Assessment: III - A patient with severe                            systemic disease. After reviewing the risks and  benefits, the patient was deemed in satisfactory                            condition to undergo the procedure.                           After obtaining informed consent, the colonoscope                            was passed under direct vision. Throughout the                            procedure, the patient's blood pressure, pulse, and                            oxygen saturations were monitored continuously. The                             CF [id number] #[id number] was introduced through the anus                            and advanced to the the cecum, identified by                            appendiceal orifice and ileocecal valve. The                            colonoscopy was performed without difficulty. The                            patient tolerated the procedure well. The quality                            of the bowel preparation was excellent. The                            ileocecal valve, appendiceal orifice, and rectum                            were photographed. Scope In: 3:20:56 PM Scope Out: 3:34:53 PM Scope Withdrawal Time: 0 hours 9 minutes 13 seconds  Total Procedure Duration: 0 hours 13 minutes 57 seconds  Findings:                 Large, prolapsed internal hemorrhoids were found on                            perianal exam. (Grade 3 - had to be manually                            reduced)                           Repeat examination of right colon under NBI  performed.                           A diminutive polyp was found in the proximal                            transverse colon. The polyp was semi-sessile. The                            polyp was removed with a cold snare. Resection and                            retrieval were complete.                           A few diverticula were found in the right colon.                           Internal hemorrhoids were found. The hemorrhoids                            were large.                           The exam was otherwise without abnormality on                            direct and retroflexion views. Complications:            No immediate complications. Estimated Blood Loss:     Estimated blood loss was minimal. Impression:               - Hemorrhoids found on perianal exam.                           - One diminutive polyp in the proximal transverse                            colon, removed with a cold snare.  Resected and                            retrieved.                           - Diverticulosis in the right colon.                           - Internal hemorrhoids.                           - The examination was otherwise normal on direct                            and retroflexion views.                           These hemorrhoids appear to large for banding. Recommendation:           -  Patient has a contact number available for                            emergencies. The signs and symptoms of potential                            delayed complications were discussed with the                            patient. Return to normal activities tomorrow.                            Written discharge instructions were provided to the                            patient.                           - Resume previous diet.                           - Resume Coumadin (warfarin) today and Plavix                            (clopidogrel) today at prior doses. Refer to                            Coumadin Clinic for further adjustment of therapy.                           - Await pathology results.                           - Repeat colonoscopy is recommended for                            surveillance. The colonoscopy date will be                            determined after pathology results from today's                            exam become available for review.                           - See the other procedure note for documentation of                            additional recommendations. Kyle Walters L. Loletha Carrow, MD 04/02/2022 3:55:00 PM This report has been signed electronically.

## 2022-04-02 NOTE — Patient Instructions (Addendum)
Recommendations - upper GI Patient has a contact number available for emergencies. The signs and symptoms of potential delayed complications were discussed with the patient. Return to normal activities tomorrow. Written discharge instructions were provided to the patient. - Resume previous diet. - Resume Coumadin (warfarin) and Plavix (clopidogrel) at prior doses today. Refer to Coumadin Clinic for further adjustment of therapy. - See the other procedure note for documentation of additional recommendations. - Follow an antireflux regimen indefinitely. - Return to my office at appointment to be scheduled.   Recommendations - Colonoscopy Patient has a contact number available for emergencies. The signs and symptoms of potential delayed complications were discussed with the patient. Return to normal potential delayed complications were discussed with the patient. Return to normal activities tomorrow. Written discharge instructions were provided to the patient. - Resume previous diet. - Resume Coumadin (warfarin) today and Plavix (clopidogrel) today at prior doses. Refer to Coumadin Clinic for further adjustment of therapy. - Await pathology results. - Repeat colonoscopy is recommended for surveillance. The colonoscopy date will be determined after pathology results from today's exam become available for review. - See the other procedure note for documentation of additional recommendations.  Handout on polyps, diverticulosis, and hemorrhoids provided.    YOU HAD AN ENDOSCOPIC PROCEDURE TODAY AT [facility]:   Refer to the procedure report that was given to you for any specific questions about what was found during the examination.  If the procedure report does not answer your questions, please call your gastroenterologist to clarify.  If you requested that your care partner not be given the details of your procedure findings, then the procedure report has been included in a  sealed envelope for you to review at your convenience later.  YOU SHOULD EXPECT: Some feelings of bloating in the abdomen. Passage of more gas than usual.  Walking can help get rid of the air that was put into your GI tract during the procedure and reduce the bloating. If you had a lower endoscopy (such as a colonoscopy or flexible sigmoidoscopy) you may notice spotting of blood in your stool or on the toilet paper. If you underwent a bowel prep for your procedure, you may not have a normal bowel movement for a few days.  Please Note:  You might notice some irritation and congestion in your nose or some drainage.  This is from the oxygen used during your procedure.  There is no need for concern and it should clear up in a day or so.  SYMPTOMS TO REPORT IMMEDIATELY:  Following lower endoscopy (colonoscopy or flexible sigmoidoscopy):  Excessive amounts of blood in the stool  Significant tenderness or worsening of abdominal pains  Swelling of the abdomen that is new, acute  Fever of 100F or higher  Following upper endoscopy (EGD)  Vomiting of blood or coffee ground material  New chest pain or pain under the shoulder blades  Painful or persistently difficult swallowing  New shortness of breath  Fever of 100F or higher  Black, tarry-looking stools  For urgent or emergent issues, a gastroenterologist can be reached at any hour by calling [phone]. Do not use MyChart messaging for urgent concerns.    DIET:  We do recommend a small meal at first, but then you may proceed to your regular diet.  Drink plenty of fluids but you should avoid alcoholic beverages for 24 hours.  ACTIVITY:  You should plan to take it easy for the rest of today and you should  NOT DRIVE or use heavy machinery until tomorrow (because of the sedation medicines used during the test).    FOLLOW UP: Our staff will call the number listed on your records the next business day following your procedure.  We will call  around 7:15- 8:00 am to check on you and address any questions or concerns that you may have regarding the information given to you following your procedure. If we do not reach you, we will leave a message.     If any biopsies were taken you will be contacted by phone or by letter within the next 1-3 weeks.  Please call us at [phone] if you have not heard about the biopsies in 3 weeks.    SIGNATURES/CONFIDENTIALITY: You and/or your care partner have signed paperwork which will be entered into your electronic medical record.  These signatures attest to the fact that that the information above on your After Visit Summary has been reviewed and is understood.  Full responsibility of the confidentiality of this discharge information lies with you and/or your care-partner.

## 2022-04-02 NOTE — Progress Notes (Signed)
To pacu, VSS. Report to [email or web address].tb 

## 2022-04-02 NOTE — Op Note (Signed)
[facility] Patient Name: Kyle Walters Procedure Date: 04/02/2022 3:08 PM MRN: [id number] Endoscopist: Mallie Mussel L. Loletha Carrow , MD, [id number] Age: 66 Referring MD:  Date of Birth: 30-Oct-1955 Gender: Male Account #: [id number] Procedure:                Upper GI endoscopy Indications:              Esophageal reflux symptoms that persist despite                            appropriate therapy Medicines:                Monitored Anesthesia Care Procedure:                Pre-Anesthesia Assessment:                           - Prior to the procedure, a History and Physical                            was performed, and patient medications and                            allergies were reviewed. The patient's tolerance of                            previous anesthesia was also reviewed. The risks                            and benefits of the procedure and the sedation                            options and risks were discussed with the patient.                            All questions were answered, and informed consent                            was obtained. Prior Anticoagulants: The patient                            last took Coumadin (warfarin) 5 days and Plavix                            (clopidogrel) 5 days prior to the procedure. ASA                            Grade Assessment: III - A patient with severe                            systemic disease. After reviewing the risks and                            benefits, the patient was deemed in satisfactory  condition to undergo the procedure.                           After obtaining informed consent, the endoscope was                            passed under direct vision. Throughout the                            procedure, the patient's blood pressure, pulse, and                            oxygen saturations were monitored continuously. The                            Endoscope was introduced through the  mouth, and                            advanced to the second part of duodenum. The upper                            GI endoscopy was accomplished without difficulty.                            The patient tolerated the procedure well. Scope In: Scope Out: Findings:                 The larynx was normal.                           There is no endoscopic evidence of Barrett's                            esophagus, hiatal hernia or stricture in the entire                            esophagus.                           Multiple sessile fundic gland polyps were found in                            the gastric fundus and in the gastric body.                           The exam of the stomach was otherwise normal.                           The cardia and gastric fundus were normal on                            retroflexion.                           The examined duodenum was normal. Complications:  No immediate complications. Estimated Blood Loss:     Estimated blood loss: none. Impression:               - Normal larynx.                           - Multiple fundic gland polyps.                           - Normal examined duodenum.                           - No specimens collected. Recommendation:           - Patient has a contact number available for                            emergencies. The signs and symptoms of potential                            delayed complications were discussed with the                            patient. Return to normal activities tomorrow.                            Written discharge instructions were provided to the                            patient.                           - Resume previous diet.                           - Resume Coumadin (warfarin) and Plavix                            (clopidogrel) at prior doses today. Refer to                            Coumadin Clinic for further adjustment of therapy.                           - See the other  procedure note for documentation of                            additional recommendations.                           - Follow an antireflux regimen indefinitely.                           - Return to my office at appointment to be                            scheduled. Catricia Scheerer L. Loletha Carrow, MD 04/02/2022 3:59:01  PM This report has been signed electronically.

## 2022-04-02 NOTE — Progress Notes (Signed)
History and Physical:  This patient presents for endoscopic testing for: Encounter Diagnoses  Name Primary?   Colon cancer screening Yes   Chronic GERD     66 year old man here for colon cancer screening as well as a diagnostic upper endoscopy for reflux symptoms.  Clinical details in [facility] office consult note of 02/02/2022. This patient has been off both Plavix and Coumadin for the last 5 days under the direction of his cardiology/anticoagulation clinic, and is not on bridging Lovenox. He also reports occasional dysphagia.  Patient is otherwise without complaints or active issues today.   Past Medical History: Past Medical History:  Diagnosis Date   ADHD (attention deficit hyperactivity disorder)    Allergy    Anemia    Anxiety panic attack   Arthritis    COPD (chronic obstructive pulmonary disease) (HCC)    bronchitis/emphysema   Coronary artery disease    Depression    Emphysema lung (HCC)    Emphysema of lung (HCC)    Enlarged prostate    Fibromyalgia    GERD (gastroesophageal reflux disease)    Hyperlipidemia    Hypertension    IBS (irritable bowel syndrome)    Migraine    Myocardial infarction ([facility])    Panic attack      Past Surgical History: Past Surgical History:  Procedure Laterality Date   CARDIAC CATHETERIZATION     CORONARY STENT INTERVENTION N/A 03/14/2021   Procedure: CORONARY STENT INTERVENTION;  Surgeon: Jettie Booze, MD;  Location: [facility];  Service: Cardiovascular;  Laterality: N/A;   LEFT HEART CATH AND CORONARY ANGIOGRAPHY N/A 03/14/2021   Procedure: LEFT HEART CATH AND CORONARY ANGIOGRAPHY;  Surgeon: Jettie Booze, MD;  Location: [facility];  Service: Cardiovascular;  Laterality: N/A;   NO PAST SURGERIES      Allergies: Allergies  Allergen Reactions   Tetracycline Anaphylaxis and Other (See Comments)   Triptans     Contraindicated d/t h/o MI    Effexor [Venlafaxine Hcl] Rash   Prednisone Other (See  Comments)    Unable to sleep    Sulfamethoxazole-Trimethoprim Nausea Only    Outpatient Meds: Current Outpatient Medications  Medication Sig Dispense Refill   acetaminophen (TYLENOL) 500 MG tablet Take 1,000 mg by mouth every 6 (six) hours as needed for mild pain.     albuterol (VENTOLIN HFA) 108 (90 Base) MCG/ACT inhaler Inhale 2 puffs into the lungs every 6 (six) hours as needed for wheezing or shortness of breath. 8 g 2   ALPRAZolam (XANAX) 1 MG tablet Take 1 mg by mouth 4 (four) times daily.      Atogepant (QULIPTA) 60 MG TABS Take by mouth daily.     B Complex-Biotin-FA (B-50 COMPLEX PO) Take 1 capsule by mouth daily.     BREO ELLIPTA 100-25 MCG/ACT AEPB TAKE 1 PUFF BY MOUTH EVERY DAY 60 each 2   Cholecalciferol (VITAMIN D3) 2000 UNITS TABS Take 4,000 Units by mouth daily after breakfast.     clopidogrel (PLAVIX) 75 MG tablet Take 1 tablet (75 mg total) by mouth daily with breakfast. 90 tablet 3   doxazosin (CARDURA) 8 MG tablet Take 8 mg by mouth daily.     escitalopram (LEXAPRO) 20 MG tablet Take 10 mg by mouth in the morning.  2   gabapentin (NEURONTIN) 400 MG capsule Take 400 mg by mouth 4 (four) times daily.      memantine (NAMENDA) 10 MG tablet TAKE 2 TABLETS BY MOUTH IN THE  MORNING AND 1 TABLET IN THE LATE AFTERNOON 90 tablet 0   Multiple Vitamin (MULTIVITAMIN WITH MINERALS) TABS tablet Take 1 tablet by mouth daily after breakfast. Centrum Silver for Men     Nerve Stimulator (NERIVIO) DEVI USE WITHIN 1 HR OF MIGRAINE. SET A STRONG YET COMFORTABLE INTENSITY LEVEL & MAINTAIN LEVEL FOR 45 MINS 1 each 12   Omega-3 Fatty Acids (FISH OIL) 1200 MG CAPS Take 1,200 mg by mouth daily after breakfast.     pantoprazole (PROTONIX) 20 MG tablet Take 1 tablet (20 mg total) by mouth daily. 90 tablet 1   phenylephrine-shark liver oil-mineral oil-petrolatum (PREPARATION H) 0.25-14-74.9 % rectal ointment Place 1 application rectally 2 (two) times daily as needed for hemorrhoids.     rosuvastatin  (CRESTOR) 20 MG tablet TAKE 1 TABLET BY MOUTH EVERY DAY 90 tablet 3   silodosin (RAPAFLO) 8 MG CAPS capsule Take 8 mg by mouth daily with breakfast.     Ubrogepant (UBRELVY) 100 MG TABS TAKE 1 TABLET MOUTH AS NEEDED AS CLOSE TO MIGRAINE ONSET AS POSSIBLE. MAY REPEAT IN 2 HOURS IF NEEDED. MAX 200 MG PER 24 HOURS. (Patient taking differently: Take 100 mg by mouth as directed. Take 1 tablet as close to migraine onset as possible. May repeat in 2 hours if needed, max dose 200 mg /daily.) 16 tablet 11   vitamin B-12 (CYANOCOBALAMIN) 500 MCG tablet Take 500 mcg by mouth daily.     warfarin (COUMADIN) 5 MG tablet TAKE 1/2 TO 1 TABLET BY MOUTH ONCE DAILY AS DIRECTED BY ANTICOAGULATION CLINIC 90 tablet 1   Botulinum Toxin Type A (BOTOX) 200 units SOLR Provider to inject 155 units into the muscles of the head and neck every 3 months. Discard remainder. 1 each 3   Ferrous Sulfate (IRON PO) Take 65 mg by mouth daily after breakfast. Hema Plex Iron     Galcanezumab-gnlm (EMGALITY) 120 MG/ML SOAJ Inject 120 mg into the skin every 30 (thirty) days. 1.12 mL 11   MAGNESIUM MALATE PO Take 400 mg by mouth daily after breakfast.     nitroGLYCERIN (NITROSTAT) 0.4 MG SL tablet Place 1 tablet (0.4 mg total) under the tongue every 5 (five) minutes as needed. (Patient not taking: Reported on 04/02/2022) 25 tablet 2   Current Facility-Administered Medications  Medication Dose Route Frequency Provider Last Rate Last Admin   0.9 %  sodium chloride infusion  500 mL Intravenous Once Danis, Kirke Corin, MD          ___________________________________________________________________ Objective   Exam:  BP 137/69   Pulse (!) 50   Temp (!) 97.1 F (36.2 C)   Ht '5\' 6"'$  (1.676 m)   Wt 169 lb (76.7 kg)   SpO2 96%   BMI 27.28 kg/m   CV: regular , S1/S2 Resp: clear to auscultation bilaterally, normal RR and effort noted GI: soft, no tenderness, with active bowel sounds.   Assessment: Encounter Diagnoses  Name  Primary?   Colon cancer screening Yes   Chronic GERD      Plan: Colonoscopy EGD with possible dilation.   The patient is appropriate for an endoscopic procedure in the ambulatory setting.   - Wilfrid Lund, MD

## 2022-04-03 ENCOUNTER — Telehealth: Payer: Self-pay

## 2022-04-03 NOTE — Telephone Encounter (Signed)
  Follow up Call-     04/02/2022    2:18 PM  Call back number  Post procedure Call Back phone  # [phone]  Permission to leave phone message Yes    Post op call attempted, no answer, left WM.

## 2022-04-04 ENCOUNTER — Ambulatory Visit: Payer: Medicare Other | Admitting: Adult Health

## 2022-04-04 DIAGNOSIS — G43711 Chronic migraine without aura, intractable, with status migrainosus: Secondary | ICD-10-CM | POA: Diagnosis not present

## 2022-04-04 MED ORDER — ONABOTULINUMTOXINA 200 UNITS IJ SOLR
155.0000 [IU] | Freq: Once | INTRAMUSCULAR | Status: AC
  Administered 2022-04-04: 155 [IU] via INTRAMUSCULAR

## 2022-04-04 NOTE — Progress Notes (Signed)
Botox- 200 units x 1 vial Lot: [id number] Expiration: 06/2024 NDC: 4944-7395-84  Bacteriostatic 0.9% Sodium Chloride- 77m total Lot: [id number]: 12/23/2022 NDC: [address]-55 Dx: GQ01.642S/P

## 2022-04-04 NOTE — Progress Notes (Signed)
1213/23: Saw dr. Jaynee Eagles in November to discussed meds. Referral to academic center was mentioned.  Reports starting Emgality has helped  01/10/2022: Had a migraine yesterday. Saw Dr. Jaynee Eagles in June to discuss Medication.   10/10/2021:  Had more headaches in may. Otherwise doing ok.   BOTOX PROCEDURE NOTE FOR MIGRAINE HEADACHE    Contraindications and precautions discussed with patient(above). Aseptic procedure was observed and patient tolerated procedure. Procedure performed by Ward Givens, NP  The condition has existed for more than 6 months, and pt does not have a diagnosis of ALS, Myasthenia Gravis or Lambert-Eaton Syndrome.  Risks and benefits of injections discussed and pt agrees to proceed with the procedure.  Written consent obtained  These injections are medically necessary.  These injections do not cause sedations or hallucinations which the oral therapies may cause.  Indication/Diagnosis: chronic migraine BOTOX(J0585) injection was performed according to protocol by [employer]. 200 units of BOTOX was dissolved into 4 cc NS.   NDC: [address]-17  Botox- 200 units x 1 vial Lot: [id number] Expiration: 06/2024 NDC: 0626-9485-46   Bacteriostatic 0.9% Sodium Chloride- 49m total Lot: [id number]: 12/23/2022 NDC: [address]-93  Dx: GZ16.967   Description of procedure:  The patient was placed in a sitting position. The standard protocol was used for Botox as follows, with 5 units of Botox injected at each site:   -Procerus muscle, midline injection  -Corrugator muscle, bilateral injection  -Frontalis muscle, bilateral injection, with 2 sites each side, medial injection was performed in the upper one third of the frontalis muscle, in the region vertical from the medial inferior edge of the superior orbital rim. The lateral injection was again in the upper one third of the forehead vertically above the lateral limbus of the cornea, 1.5 cm lateral to the medial  injection site.  -Temporalis muscle injection, 4 sites, bilaterally. The first injection was 3 cm above the tragus of the ear, second injection site was 1.5 cm to 3 cm up from the first injection site in line with the tragus of the ear. The third injection site was 1.5-3 cm forward between the first 2 injection sites. The fourth injection site was 1.5 cm posterior to the second injection site.  -Occipitalis muscle injection, 3 sites, bilaterally. The first injection was done one half way between the occipital protuberance and the tip of the mastoid process behind the ear. The second injection site was done lateral and superior to the first, 1 fingerbreadth from the first injection. The third injection site was 1 fingerbreadth superiorly and medially from the first injection site.  -Cervical paraspinal muscle injection, 2 sites, bilateral knee first injection site was 1 cm from the midline of the cervical spine, 3 cm inferior to the lower border of the occipital protuberance. The second injection site was 1.5 cm superiorly and laterally to the first injection site.  -Trapezius muscle injection was performed at 3 sites, bilaterally. The first injection site was in the upper trapezius muscle halfway between the inflection point of the neck, and the acromion. The second injection site was one half way between the acromion and the first injection site. The third injection was done between the first injection site and the inflection point of the neck.   Will return for repeat injection in 3 months.   A 200 units of Botox was used, 155 units were injected, the rest of the Botox was wasted. The patient tolerated the procedure well, there were no complications of the above procedure.  Ward Givens, MSN, NP-C 04/04/2022, 3:32 PM [facility] [address] [place], [place] [address] [phone]

## 2022-04-04 NOTE — Progress Notes (Deleted)
Subjective:    Patient ID: Kyle Walters is a 66 y.o. male.  HPI {Common ambulatory SmartLinks:[id number]}  Review of Systems  Objective:  Neurological Exam  Physical Exam  Assessment:   ***  Plan:   ***

## 2022-04-05 ENCOUNTER — Encounter: Payer: Self-pay | Admitting: Gastroenterology

## 2022-04-11 ENCOUNTER — Ambulatory Visit: Payer: Medicare Other | Attending: Cardiovascular Disease

## 2022-04-11 ENCOUNTER — Ambulatory Visit: Payer: Medicare Other

## 2022-04-11 DIAGNOSIS — Z5181 Encounter for therapeutic drug level monitoring: Secondary | ICD-10-CM | POA: Diagnosis not present

## 2022-04-11 DIAGNOSIS — I513 Intracardiac thrombosis, not elsewhere classified: Secondary | ICD-10-CM | POA: Diagnosis not present

## 2022-04-11 LAB — POCT INR: INR: 1.8 — AB (ref 2.0–3.0)

## 2022-04-11 NOTE — Patient Instructions (Signed)
Description   DO NOT HIGHLIGHT PAPERS & GIVE PRINTED PAPERWORK TO WIFE-THESE THINGS TRIGGER MIGRAINES. Take 1 tablet today and then continue on same dosage 1/2 tablet daily except 1 tablet on Tuesdays, Thursdays and Saturdays.  Continue eating leafy veggies daily as normal.  Recheck INR in 2 weeks at [facility]

## 2022-04-25 ENCOUNTER — Ambulatory Visit: Payer: Medicare Other | Attending: Cardiovascular Disease

## 2022-04-25 DIAGNOSIS — Z5181 Encounter for therapeutic drug level monitoring: Secondary | ICD-10-CM | POA: Diagnosis not present

## 2022-04-25 DIAGNOSIS — I513 Intracardiac thrombosis, not elsewhere classified: Secondary | ICD-10-CM | POA: Diagnosis not present

## 2022-04-25 LAB — POCT INR: INR: 2 (ref 2.0–3.0)

## 2022-04-25 NOTE — Telephone Encounter (Signed)
Received this message from [facility] regarding Ubrelvy 100 mg. This medication or product was previously approved on [id number] from 04/23/2022 - 04/23/2023.   Approval notice has been faxed to [facility]. Received a receipt of confirmation.

## 2022-04-25 NOTE — Patient Instructions (Signed)
DO NOT HIGHLIGHT PAPERS & GIVE PRINTED PAPERWORK TO WIFE-THESE THINGS TRIGGER MIGRAINES.  continue on same dosage 1/2 tablet daily except 1 tablet on Tuesdays, Thursdays and Saturdays.  Continue eating leafy veggies daily as normal.  Recheck INR in 6 weeks at [facility]

## 2022-04-26 ENCOUNTER — Telehealth: Payer: Self-pay | Admitting: Interventional Cardiology

## 2022-04-26 DIAGNOSIS — E785 Hyperlipidemia, unspecified: Secondary | ICD-10-CM | POA: Diagnosis not present

## 2022-04-26 DIAGNOSIS — Z131 Encounter for screening for diabetes mellitus: Secondary | ICD-10-CM | POA: Diagnosis not present

## 2022-04-26 DIAGNOSIS — E119 Type 2 diabetes mellitus without complications: Secondary | ICD-10-CM | POA: Diagnosis not present

## 2022-04-26 NOTE — Telephone Encounter (Signed)
I spoke with patient. He wears fit bit when exercising and for the last week has noticed heart rate will go up to low 120's with exercise.  Then drops quickly to 80's.  Heart rate is usually lower than this when exercising.  Fit bit also indicates heart rate has been higher with walking.  Patient does not feel elevated heart rate.  No caffeine intake. Not taking any new medications. Has slight nasal congestion which he feels is related to his medications. No fever. Also for last week has noted pain in right bicep. Occurs with exercise and at rest.  For last week has had ache in left calf when exercising. No swelling.  No bruising. No injury to area. Patient also reports slight pain across sternal area.  Started this past week.  Can happen at rest but most often occurs with exertion and does improve with rest. Appointment made for patient to see Dr Irish Lack tomorrow at 3:00.

## 2022-04-26 NOTE — Telephone Encounter (Signed)
STAT if HR is under 50 or over 120 (normal HR is 60-100 beats per minute)  What is your heart rate? It have been as high as 120 and drops down suddenly  Do you have a log of your heart rate readings (document readings)?   Do you have any other symptoms? Pain in his left calf of his leg and left bicep of his arm- a little chest pain off and on- not at this time- he says he is not sure what is going on

## 2022-04-27 ENCOUNTER — Ambulatory Visit: Payer: Medicare Other | Attending: Interventional Cardiology | Admitting: Interventional Cardiology

## 2022-04-27 ENCOUNTER — Encounter: Payer: Self-pay | Admitting: Interventional Cardiology

## 2022-04-27 VITALS — BP 110/62 | HR 71 | Ht 67.0 in | Wt 164.0 lb

## 2022-04-27 DIAGNOSIS — I5022 Chronic systolic (congestive) heart failure: Secondary | ICD-10-CM | POA: Diagnosis not present

## 2022-04-27 DIAGNOSIS — E785 Hyperlipidemia, unspecified: Secondary | ICD-10-CM | POA: Diagnosis not present

## 2022-04-27 DIAGNOSIS — Z955 Presence of coronary angioplasty implant and graft: Secondary | ICD-10-CM

## 2022-04-27 DIAGNOSIS — I25118 Atherosclerotic heart disease of native coronary artery with other forms of angina pectoris: Secondary | ICD-10-CM

## 2022-04-27 DIAGNOSIS — I252 Old myocardial infarction: Secondary | ICD-10-CM | POA: Diagnosis not present

## 2022-04-27 DIAGNOSIS — I513 Intracardiac thrombosis, not elsewhere classified: Secondary | ICD-10-CM | POA: Diagnosis not present

## 2022-04-27 NOTE — Progress Notes (Signed)
Cardiology Office Note   Date:  04/27/2022   ID:  Kyle Walters, DOB December 07, 1955, MRN [id number]  PCP:  Elwyn Reach, MD    No chief complaint on file.  CAD  Wt Readings from Last 3 Encounters:  04/27/22 164 lb (74.4 kg)  04/02/22 169 lb (76.7 kg)  02/21/22 171 lb (77.6 kg)       History of Present Illness: Kyle Walters is a 67 y.o. male  with left ventricular dysfunction.   Patient had a lung cancer screening CT.  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  He was set up for echocardiogram which was done today in our office.  This revealed an LV apical aneurysm and there appeared to be thrombus in the aneurysm with Definity contrast injection.   He had a stress test many years ago no prior catheterization or known MI that he has had.  He does have anxiety and panic issues and has had chest pain on multiple occasions.  He actually went to the emergency room due to anxiety causing chest pain, from reading the CT scan report.  His troponin in the ER was normal.  He was sent home.     He denies any exertional angina.  His exercise level has dropped off in the last few years.  He has migraines stimulated by any type of smell.  This has limited the amount that he goes outside.   Of note, his ECG done in 2022 appeared to show poor R wave progression.  When I look back at prior ECG from 2017, he had normal R wave progression.  No other cardiac testing in that time.   Echo showed LV thrombus and he was started on ELiquis. CTA of the coronaries was done.  This showed an occluded LAD.  There was a possible stenosis in the RCA which was medically treated for some time.  His circumflex and left main were widely patent.  Cardiac MRI showed nonviable apex.    He took Eliquis for 3 months and then had cath in 02/2021: "Mid LAD lesion is 100% stenosed.  Left to left collaterals.  The apex was nonviable by cardiac MRI.   RPAV lesion is 100% stenosed.  Left to right collaterals.   Prox RCA lesion  is 90% stenosed.   A drug-eluting stent was successfully placed using a STENT ONYX FRONTIER 3.0X34, postdilated to greater than 3.75 mm.   Post intervention, there is a 0% residual stenosis.   Mid RCA lesion is 75% stenosed.   A drug-eluting stent was successfully placed using a STENT ONYX FRONTIER 2.75X22.   Post intervention, there is a 0% residual stenosis.   LV end diastolic pressure is normal.   There is no aortic valve stenosis.   Continue dual antiplatelet therapy with aspirin and Plavix.  He has completed 3 months of anticoagulation with Eliquis for his LV thrombus.  Continue aggressive secondary prevention including high-dose statin.  Prevacid will have to be changed to Protonix."   There was concern for recurrence of LV thrombus since his apex was found to be nonviable.    He restarted Boswellia after the cath and started having more dehydration and headaches.    In March 2023, he was feeling some chest pain that he thought was reflux.  Went to the ER, work-up was negative for any heart attack,  heart failure.  Symptoms got better with a GI cocktail.   He has modified his diet to reduce GERD.  Less Tabasco sauce,  no caffeine, avoids eating late at night.   He did finish cardiac rehab.     Has migraines when [place]s [place] so wants to change his location of INR testing.    He lost weight by decreasing sugar intake.    He does walk regularly. Uses a peddler.   He went to cardiac rehab and had a migraine triggered by Sani-cloth smell.   In early 2024, he notes that his HR is higher onhis fitbit, but no sx.  HR in the 70-90s after walking.    Still working on the peddler, several days a week.  He does it if he does not have a migraine.   Denies :  Dizziness. Leg edema. Orthopnea. Palpitations. Paroxysmal nocturnal dyspnea. Shortness of breath. Syncope.    Has had some discomfort in chest (prior dislocated sternum x 2), back pain.  Tried a SL NTG with no  change.    No real chest pain with exercising, peddling or walking.    Past Medical History:  Diagnosis Date   ADHD (attention deficit hyperactivity disorder)    Allergy    Anemia    Anxiety panic attack   Arthritis    COPD (chronic obstructive pulmonary disease) (HCC)    bronchitis/emphysema   Coronary artery disease    Depression    Emphysema lung (HCC)    Emphysema of lung (HCC)    Enlarged prostate    Fibromyalgia    GERD (gastroesophageal reflux disease)    Hyperlipidemia    Hypertension    IBS (irritable bowel syndrome)    Migraine    Myocardial infarction ([place])    Panic attack     Past Surgical History:  Procedure Laterality Date   CARDIAC CATHETERIZATION     CORONARY STENT INTERVENTION N/A 03/14/2021   Procedure: CORONARY STENT INTERVENTION;  Surgeon: Jettie Booze, MD;  Location: [facility];  Service: Cardiovascular;  Laterality: N/A;   LEFT HEART CATH AND CORONARY ANGIOGRAPHY N/A 03/14/2021   Procedure: LEFT HEART CATH AND CORONARY ANGIOGRAPHY;  Surgeon: Jettie Booze, MD;  Location: [facility];  Service: Cardiovascular;  Laterality: N/A;   NO PAST SURGERIES       Current Outpatient Medications  Medication Sig Dispense Refill   acetaminophen (TYLENOL) 500 MG tablet Take 1,000 mg by mouth every 6 (six) hours as needed for mild pain.     albuterol (VENTOLIN HFA) 108 (90 Base) MCG/ACT inhaler Inhale 2 puffs into the lungs every 6 (six) hours as needed for wheezing or shortness of breath. 8 g 2   ALPRAZolam (XANAX) 1 MG tablet Take 1 mg by mouth 4 (four) times daily.      Atogepant (QULIPTA) 60 MG TABS Take by mouth daily.     B Complex-Biotin-FA (B-50 COMPLEX PO) Take 1 capsule by mouth daily.     Botulinum Toxin Type A (BOTOX) 200 units SOLR Provider to inject 155 units into the muscles of the head and neck every 3 months. Discard remainder. 1 each 3   BREO ELLIPTA 100-25 MCG/ACT AEPB TAKE 1 PUFF BY MOUTH EVERY DAY 60 each 2    Cholecalciferol (VITAMIN D3) 2000 UNITS TABS Take 4,000 Units by mouth daily after breakfast.     clopidogrel (PLAVIX) 75 MG tablet Take 1 tablet (75 mg total) by mouth daily with breakfast. 90 tablet 3   doxazosin (CARDURA) 8 MG tablet Take 8 mg by mouth daily.     escitalopram (LEXAPRO) 20  MG tablet Take 10 mg by mouth in the morning.  2   Ferrous Sulfate (IRON PO) Take 65 mg by mouth daily after breakfast. Hema Plex Iron     gabapentin (NEURONTIN) 400 MG capsule Take 400 mg by mouth 4 (four) times daily.      Galcanezumab-gnlm (EMGALITY) 120 MG/ML SOAJ Inject 120 mg into the skin every 30 (thirty) days. 1.12 mL 11   MAGNESIUM MALATE PO Take 400 mg by mouth daily after breakfast.     memantine (NAMENDA) 10 MG tablet TAKE 2 TABLETS BY MOUTH IN THE MORNING AND 1 TABLET IN THE LATE AFTERNOON 90 tablet 0   Multiple Vitamin (MULTIVITAMIN WITH MINERALS) TABS tablet Take 1 tablet by mouth daily after breakfast. Centrum Silver for Men     Nerve Stimulator (NERIVIO) DEVI USE WITHIN 1 HR OF MIGRAINE. SET A STRONG YET COMFORTABLE INTENSITY LEVEL & MAINTAIN LEVEL FOR 45 MINS 1 each 12   nitroGLYCERIN (NITROSTAT) 0.4 MG SL tablet Place 1 tablet (0.4 mg total) under the tongue every 5 (five) minutes as needed. 25 tablet 2   Omega-3 Fatty Acids (FISH OIL) 1200 MG CAPS Take 1,200 mg by mouth daily after breakfast.     pantoprazole (PROTONIX) 20 MG tablet Take 1 tablet (20 mg total) by mouth daily. 90 tablet 1   phenylephrine-shark liver oil-mineral oil-petrolatum (PREPARATION H) 0.25-14-74.9 % rectal ointment Place 1 application rectally 2 (two) times daily as needed for hemorrhoids.     rosuvastatin (CRESTOR) 20 MG tablet TAKE 1 TABLET BY MOUTH EVERY DAY 90 tablet 3   silodosin (RAPAFLO) 8 MG CAPS capsule Take 8 mg by mouth daily with breakfast.     Ubrogepant (UBRELVY) 100 MG TABS TAKE 1 TABLET MOUTH AS NEEDED AS CLOSE TO MIGRAINE ONSET AS POSSIBLE. MAY REPEAT IN 2 HOURS IF NEEDED. MAX 200 MG PER 24 HOURS.  (Patient taking differently: Take 100 mg by mouth as directed. Take 1 tablet as close to migraine onset as possible. May repeat in 2 hours if needed, max dose 200 mg /daily.) 16 tablet 11   vitamin B-12 (CYANOCOBALAMIN) 500 MCG tablet Take 500 mcg by mouth daily.     warfarin (COUMADIN) 5 MG tablet TAKE 1/2 TO 1 TABLET BY MOUTH ONCE DAILY AS DIRECTED BY ANTICOAGULATION CLINIC 90 tablet 1   No current facility-administered medications for this visit.    Allergies:   Tetracycline, Triptans, Effexor [venlafaxine hcl], Prednisone, and Sulfamethoxazole-trimethoprim    Social History:  The patient  reports that he quit smoking about 8 years ago. His smoking use included cigarettes. He has a 60.00 pack-year smoking history. He has never used smokeless tobacco. He reports that he does not drink alcohol and does not use drugs.   Family History:  The patient's family history includes Bladder Cancer in his father; Breast cancer in his mother; Diabetes in his father and mother; Heart disease in his father and mother; Hypertension in his brother and father; Irritable bowel syndrome in his father; Migraines in his father; Osteoarthritis in his mother; Other in his brother and father; Pancreatic cancer in his father; Stroke in his father and mother.    ROS:  Please see the history of present illness.   Otherwise, review of systems are positive for noting HR increase on fitbit.   All other systems are reviewed and negative.    PHYSICAL EXAM: VS:  BP 110/62   Pulse 71   Ht '5\' 7"'$  (1.702 m)   Wt 164 lb (74.4 kg)  SpO2 95%   BMI 25.69 kg/m  , BMI Body mass index is 25.69 kg/m. GEN: Well nourished, well developed, in no acute distress HEENT: normal Neck: no JVD, carotid bruits, or masses Cardiac: RRR; no murmurs, rubs, or gallops,no edema  Respiratory:  clear to auscultation bilaterally, normal work of breathing GI: soft, nontender, nondistended, + BS MS: no deformity or atrophy Skin: warm and dry, no  rash Neuro:  Strength and sensation are intact Psych: euthymic mood, full affect   EKG:   The ekg ordered today demonstrates NSR, no ST changes   Recent Labs: 05/19/2021: Magnesium 2.0 08/20/2021: ALT 36; BUN 16; Creatinine, Ser 1.00; Hemoglobin 15.0; Platelets 239; Potassium 4.3; Sodium 139   Lipid Panel    Component Value Date/Time   CHOL 137 05/01/2021 0851   TRIG 62 05/01/2021 0851   HDL 50 05/01/2021 0851   CHOLHDL 2.7 05/01/2021 0851   LDLCALC 74 05/01/2021 0851     Other studies Reviewed: Additional studies/ records that were reviewed today with results demonstrating: LDL 74, ALT 36, HDL 50 and 2023.   ASSESSMENT AND PLAN:  CAD: has had some atypical chest pain.  Nothing that seems like angina.  Unclear why his heart rate seems to be a little bit higher.  Today stated that the heart rates were in the 70s to 80s at times.  Given that this is still in the normal range, will not add any medication. Chronic systolic heart failure: He appears euvolemic. Hyperlipidemia: Lipids well-controlled. Emphysema: You doing inhalers LV thrombus: Anticoagulated with warfarin.     Current medicines are reviewed at length with the patient today.  The patient concerns regarding his medicines were addressed.  The following changes have been made:  No change  Labs/ tests ordered today include:  No orders of the defined types were placed in this encounter.   Recommend 150 minutes/week of aerobic exercise Low fat, low carb, high fiber diet recommended  Disposition:   FU in 6 months   Signed, Larae Grooms, MD  04/27/2022 3:47 PM    [facility] [place], [place],   [address] Phone: [phone]; Fax: [phone]

## 2022-04-27 NOTE — Patient Instructions (Signed)
Medication Instructions:  Your physician recommends that you continue on your current medications as directed. Please refer to the Current Medication list given to you today.  *If you need a refill on your cardiac medications before your next appointment, please call your pharmacy*   Lab Work: none If you have labs (blood work) drawn today and your tests are completely normal, you will receive your results only by: [place] (if you have MyChart) OR A paper copy in the mail If you have any lab test that is abnormal or we need to change your treatment, we will call you to review the results.   Testing/Procedures: none   Follow-Up: At [facility], you and your health needs are our priority.  As part of our continuing mission to provide you with exceptional heart care, we have created designated Provider Care Teams.  These Care Teams include your primary Cardiologist (physician) and Advanced Practice Providers (APPs -  Physician Assistants and Nurse Practitioners) who all work together to provide you with the care you need, when you need it.  We recommend signing up for the patient portal called "MyChart".  Sign up information is provided on this After Visit Summary.  MyChart is used to connect with patients for Virtual Visits (Telemedicine).  Patients are able to view lab/test results, encounter notes, upcoming appointments, etc.  Non-urgent messages can be sent to your provider as well.   To learn more about what you can do with MyChart, go to [email or web address].    Your next appointment:   6 month(s)  The format for your next appointment:   In Person  Provider:   Larae Grooms, MD     Other Instructions    Important Information About Sugar

## 2022-04-30 ENCOUNTER — Encounter: Payer: Self-pay | Admitting: Interventional Cardiology

## 2022-04-30 NOTE — Addendum Note (Signed)
Addended by: Janan Halter F on: 04/30/2022 07:49 PM   Modules accepted: Orders

## 2022-05-04 ENCOUNTER — Ambulatory Visit: Payer: Medicare Other | Admitting: Interventional Cardiology

## 2022-05-08 ENCOUNTER — Encounter: Payer: Self-pay | Admitting: Neurology

## 2022-05-10 DIAGNOSIS — R590 Localized enlarged lymph nodes: Secondary | ICD-10-CM | POA: Diagnosis not present

## 2022-05-16 ENCOUNTER — Encounter: Payer: Self-pay | Admitting: Gastroenterology

## 2022-05-16 ENCOUNTER — Ambulatory Visit: Payer: Medicare Other | Admitting: Gastroenterology

## 2022-05-16 VITALS — BP 118/76 | HR 58 | Ht 67.0 in | Wt 169.0 lb

## 2022-05-16 DIAGNOSIS — K219 Gastro-esophageal reflux disease without esophagitis: Secondary | ICD-10-CM | POA: Diagnosis not present

## 2022-05-16 DIAGNOSIS — K648 Other hemorrhoids: Secondary | ICD-10-CM

## 2022-05-16 NOTE — Progress Notes (Signed)
Kyle Walters  Chief Complaint: Internal hemorrhoids and GERD.  Subjective  History: Kyle Walters is here to follow-up results of recent endoscopic testing.  He underwent EGD and colonoscopy for GERD and colon cancer screening respectively on 04/02/2022. Colonoscopy: Grade 3 internal hemorrhoids, diminutive tubular adenoma, diverticulosis. EGD: Fundic gland polyps and otherwise normal exam ._________________   Kyle Walters was here with his wife today.  He is still bothered by the internal hemorrhoids, which she describes as a swelling and fullness that feels like "a critter is nibbling down there" with bowel movements, which are regular without straining. He also has concerns about having been on PPI for perhaps 20 years for reflux symptoms.  He would like to try to get off medicine if he can. Denies dysphagia or odynophagia. ROS: Cardiovascular:  no chest pain Respiratory: no dyspnea  The patient's Past Medical, Family and Social History were reviewed and are on file in the EMR. Past Medical History:  Diagnosis Date   ADHD (attention deficit hyperactivity disorder)    Allergy    Anemia    Anxiety panic attack   Arthritis    COPD (chronic obstructive pulmonary disease) (HCC)    bronchitis/emphysema   Coronary artery disease    Depression    Emphysema lung (HCC)    Emphysema of lung (HCC)    Enlarged prostate    Fibromyalgia    GERD (gastroesophageal reflux disease)    Hyperlipidemia    Hypertension    IBS (irritable bowel syndrome)    Migraine    Myocardial infarction ([address])    Panic attack      Objective:  Med list reviewed  Current Outpatient Medications:    acetaminophen (TYLENOL) 500 MG tablet, Take 1,000 mg by mouth every 6 (six) hours as needed for mild pain., Disp: , Rfl:    ALPRAZolam (XANAX) 1 MG tablet, Take 1 mg by mouth 4 (four) times daily. , Disp: , Rfl:    Atogepant (QULIPTA) 60 MG TABS, Take by mouth daily., Disp: , Rfl:    B  Complex-Biotin-FA (B-50 COMPLEX PO), Take 1 capsule by mouth daily., Disp: , Rfl:    Botulinum Toxin Type A (BOTOX) 200 units SOLR, Provider to inject 155 units into the muscles of the head and neck every 3 months. Discard remainder., Disp: 1 each, Rfl: 3   BREO ELLIPTA 100-25 MCG/ACT AEPB, TAKE 1 PUFF BY MOUTH EVERY DAY, Disp: 60 each, Rfl: 2   Cholecalciferol (VITAMIN D3) 2000 UNITS TABS, Take 4,000 Units by mouth daily after breakfast., Disp: , Rfl:    clopidogrel (PLAVIX) 75 MG tablet, Take 1 tablet (75 mg total) by mouth daily with breakfast., Disp: 90 tablet, Rfl: 3   doxazosin (CARDURA) 8 MG tablet, Take 8 mg by mouth daily., Disp: , Rfl:    escitalopram (LEXAPRO) 20 MG tablet, Take 10 mg by mouth in the morning., Disp: , Rfl: 2   Ferrous Sulfate (IRON PO), Take 65 mg by mouth daily after breakfast. Hema Plex Iron, Disp: , Rfl:    gabapentin (NEURONTIN) 400 MG capsule, Take 400 mg by mouth 4 (four) times daily. , Disp: , Rfl:    Galcanezumab-gnlm (EMGALITY) 120 MG/ML SOAJ, Inject 120 mg into the skin every 30 (thirty) days., Disp: 1.12 mL, Rfl: 11   MAGNESIUM MALATE PO, Take 400 mg by mouth daily after breakfast., Disp: , Rfl:    memantine (NAMENDA) 10 MG tablet, TAKE 2 TABLETS BY MOUTH IN THE MORNING AND 1 TABLET IN  THE LATE AFTERNOON, Disp: 90 tablet, Rfl: 0   Multiple Vitamin (MULTIVITAMIN WITH MINERALS) TABS tablet, Take 1 tablet by mouth daily after breakfast. Centrum Silver for Men, Disp: , Rfl:    Nerve Stimulator (NERIVIO) DEVI, USE WITHIN 1 HR OF MIGRAINE. SET A STRONG YET COMFORTABLE INTENSITY LEVEL & MAINTAIN LEVEL FOR 45 MINS, Disp: 1 each, Rfl: 12   Omega-3 Fatty Acids (FISH OIL) 1200 MG CAPS, Take 1,200 mg by mouth daily after breakfast., Disp: , Rfl:    pantoprazole (PROTONIX) 20 MG tablet, Take 1 tablet (20 mg total) by mouth daily., Disp: 90 tablet, Rfl: 1   phenylephrine-shark liver oil-mineral oil-petrolatum (PREPARATION H) 0.25-14-74.9 % rectal ointment, Place 1 application  rectally 2 (two) times daily as needed for hemorrhoids., Disp: , Rfl:    rosuvastatin (CRESTOR) 20 MG tablet, TAKE 1 TABLET BY MOUTH EVERY DAY, Disp: 90 tablet, Rfl: 3   silodosin (RAPAFLO) 8 MG CAPS capsule, Take 8 mg by mouth daily with breakfast., Disp: , Rfl:    Ubrogepant (UBRELVY) 100 MG TABS, TAKE 1 TABLET MOUTH AS NEEDED AS CLOSE TO MIGRAINE ONSET AS POSSIBLE. MAY REPEAT IN 2 HOURS IF NEEDED. MAX 200 MG PER 24 HOURS. (Patient taking differently: Take 100 mg by mouth as directed. Take 1 tablet as close to migraine onset as possible. May repeat in 2 hours if needed, max dose 200 mg /daily.), Disp: 16 tablet, Rfl: 11   vitamin B-12 (CYANOCOBALAMIN) 500 MCG tablet, Take 500 mcg by mouth daily., Disp: , Rfl:    warfarin (COUMADIN) 5 MG tablet, TAKE 1/2 TO 1 TABLET BY MOUTH ONCE DAILY AS DIRECTED BY ANTICOAGULATION CLINIC, Disp: 90 tablet, Rfl: 1   albuterol (VENTOLIN HFA) 108 (90 Base) MCG/ACT inhaler, Inhale 2 puffs into the lungs every 6 (six) hours as needed for wheezing or shortness of breath. (Patient not taking: Reported on 05/16/2022), Disp: 8 g, Rfl: 2   nitroGLYCERIN (NITROSTAT) 0.4 MG SL tablet, Place 1 tablet (0.4 mg total) under the tongue every 5 (five) minutes as needed. (Patient not taking: Reported on 05/16/2022), Disp: 25 tablet, Rfl: 2   Vital signs in last 24 hrs: Vitals:   05/16/22 1458  BP: 118/76  Pulse: (!) 58   Wt Readings from Last 3 Encounters:  05/16/22 169 lb (76.7 kg)  04/27/22 164 lb (74.4 kg)  04/02/22 169 lb (76.7 kg)    Physical Exam  Well-appearing.  No additional exam. Slow cognitive processing -it takes him a while to organize his thoughts.   Labs:   ___________________________________________ Radiologic studies:   ____________________________________________ Other: Colon pathology result as noted above  _____________________________________________ Assessment & Plan  Assessment: Encounter Diagnoses  Name Primary?   Internal prolapsed  hemorrhoids Yes   Gastroesophageal reflux disease without esophagitis    Symptomatic internal hemorrhoids that he describes as a discomfort of some kind without bleeding.  Topical therapy has not been helpful.  I think his hemorrhoids are too large for banding, but he would also be increased risk for surgical excision given his cardiac condition and long-term use of Plavix and Coumadin. I have referred him to Dr. Drue Flirt of colorectal surgery for further evaluation.  Regarding his reflux, we will try to de-escalate acid suppression therapy and see if we can get him down to as needed use of H2 blocker.  I am reassured that he had no evidence of esophagitis, Barrett's or EOE.  He would certainly be increased risk for consideration of to for fundoplication.   Plan: Discontinue  PPI and start famotidine 40 mg once daily. 4 weeks later, decrease the famotidine to 1 tablet every other day See me in about 8 weeks.  20 minutes were spent on this encounter (including chart review, history/exam, counseling/coordination of care, and documentation) > 50% of that time was spent on counseling and coordination of care.   Kyle Walters

## 2022-05-16 NOTE — Patient Instructions (Addendum)
_______________________________________________________  If your blood pressure at your visit was 140/90 or greater, please contact your primary care physician to follow up on this.  _______________________________________________________  If you are age 68 or older, your body mass index should be between 23-30. Your Body mass index is 26.47 kg/m. If this is out of the aforementioned range listed, please consider follow up with your Primary Care Provider.  If you are age 90 or younger, your body mass index should be between 19-25. Your Body mass index is 26.47 kg/m. If this is out of the aformentioned range listed, please consider follow up with your Primary Care Provider.   ________________________________________________________  The [facility] providers would like to encourage you to use [facility] to communicate with providers for non-urgent requests or questions.  Due to long hold times on the telephone, sending your provider a message by [facility] may be a faster and more efficient way to get a response.  Please allow 48 business hours for a response.  Please remember that this is for non-urgent requests.  _______________________________________________________  Stop pantoprazole Start famotidine '40mg'$  daily, after 4 weeks decrease to 40 mg every other day.  We will send your records to Dr. [facility] [place], [place] [address]  [phone]  Follow up in 8 weeks  It was a pleasure to see you today!  Thank you for trusting me with your gastrointestinal care!

## 2022-05-17 ENCOUNTER — Telehealth: Payer: Self-pay | Admitting: Interventional Cardiology

## 2022-05-17 ENCOUNTER — Encounter: Payer: Self-pay | Admitting: Emergency Medicine

## 2022-05-17 NOTE — Telephone Encounter (Signed)
Patient notified

## 2022-05-17 NOTE — Telephone Encounter (Signed)
No interactions between famotidine and current cardiac medications

## 2022-05-17 NOTE — Telephone Encounter (Signed)
Pt c/o medication issue:  1. Name of Medication:   pantoprazole (PROTONIX) 20 MG tablet    2. How are you currently taking this medication (dosage and times per day)? Take 1 tablet (20 mg total) by mouth daily.   3. Are you having a reaction (difficulty breathing--STAT)? No  4. What is your medication issue?  Pt states that GI provider is wanting to change above medication to either Pepcid AC or Zantac. He would like a callback regarding this matter. Please advise

## 2022-05-17 NOTE — Telephone Encounter (Signed)
I spoke with patient.  He is calling to see if any problems with his heart medications if changed from Protonix to Pepcid [facility] or Zantac

## 2022-05-17 NOTE — Telephone Encounter (Signed)
Dr. Lamonte Sakai, please see pt's message regarding taking Famotidine with his COPD. Thanks.

## 2022-05-17 NOTE — Telephone Encounter (Signed)
Usually H2 blockers are well tolerated. I think he can follow the recs from Dr Loletha Carrow and keep track of any changes in his symptoms. If he notices a change we can trouble shoot.

## 2022-05-18 ENCOUNTER — Telehealth: Payer: Self-pay

## 2022-05-18 NOTE — Telephone Encounter (Signed)
Referral sent to Dr Drue Flirt. Will await appointment information.

## 2022-05-21 DIAGNOSIS — M25612 Stiffness of left shoulder, not elsewhere classified: Secondary | ICD-10-CM | POA: Diagnosis not present

## 2022-05-21 DIAGNOSIS — R590 Localized enlarged lymph nodes: Secondary | ICD-10-CM | POA: Diagnosis not present

## 2022-05-26 ENCOUNTER — Other Ambulatory Visit: Payer: Self-pay | Admitting: Interventional Cardiology

## 2022-05-31 ENCOUNTER — Telehealth: Payer: Self-pay | Admitting: Adult Health

## 2022-05-31 NOTE — Telephone Encounter (Signed)
This pt is scheduled for botox injection for 07/04/22 and I was looking at his PA and it looks like he has two on file. One from [facility] ([place] # [id number] (10/09/2021-10/10/2022) and one from [place] [phone]. BOTOX INJ 200UNIT is approved through 06/14/2022. Just to make sure I was thinking correctly, is the [place] is for the visit portion and the [employer] is for the medication portion?

## 2022-05-31 NOTE — Telephone Encounter (Signed)
Yes for many of our [facility] Patients they need PAs from [facility] and [facility]. I believe [employer] approves both procedure and drug but then we still need [facility] PA approval so the drug can be delivered. If the [facility] rx approval ends before this appointment then we need that PA renewed by the PA team.   PA team, can you look into this? Renewing [facility] PA. Looks like it expires before his next appointment.

## 2022-06-04 DIAGNOSIS — M542 Cervicalgia: Secondary | ICD-10-CM | POA: Diagnosis not present

## 2022-06-04 DIAGNOSIS — M546 Pain in thoracic spine: Secondary | ICD-10-CM | POA: Diagnosis not present

## 2022-06-04 NOTE — Telephone Encounter (Signed)
Appointment has been scheduled for 06-23-2022 at the [facility] location.

## 2022-06-06 ENCOUNTER — Ambulatory Visit: Payer: Medicare Other | Attending: Cardiovascular Disease

## 2022-06-06 DIAGNOSIS — I513 Intracardiac thrombosis, not elsewhere classified: Secondary | ICD-10-CM | POA: Diagnosis not present

## 2022-06-06 DIAGNOSIS — Z5181 Encounter for therapeutic drug level monitoring: Secondary | ICD-10-CM | POA: Diagnosis not present

## 2022-06-06 LAB — POCT INR: INR: 1.8 — AB (ref 2.0–3.0)

## 2022-06-06 NOTE — Patient Instructions (Signed)
Description   DO NOT HIGHLIGHT PAPERS & GIVE PRINTED PAPERWORK TO WIFE-THESE THINGS TRIGGER MIGRAINES. Take 1 tablet today and then START taking 1 tablet daily EXCEPT 0.5 tablet on Mondays, Wednesdays, and Fridays. Continue eating leafy veggies daily as normal.  Recheck INR in 3 weeks.  Anticoagulation Clinic [phone]

## 2022-06-08 ENCOUNTER — Other Ambulatory Visit: Payer: Self-pay | Admitting: Interventional Cardiology

## 2022-06-08 NOTE — Telephone Encounter (Signed)
Please send to GI as phone note from 05/17/22 indicates GI was going to make changes in patient's medications.

## 2022-06-11 NOTE — Telephone Encounter (Signed)
Checking for an update, pt scheduled for 07/04/22

## 2022-06-12 ENCOUNTER — Other Ambulatory Visit: Payer: Self-pay | Admitting: Neurology

## 2022-06-13 ENCOUNTER — Other Ambulatory Visit (HOSPITAL_COMMUNITY): Payer: Self-pay

## 2022-06-14 ENCOUNTER — Other Ambulatory Visit (HOSPITAL_COMMUNITY): Payer: Self-pay

## 2022-06-14 NOTE — Telephone Encounter (Signed)
     The rx was filled today and the PA for [facility] is still active-PT should be good to go for his appointment.

## 2022-06-14 NOTE — Telephone Encounter (Signed)
Noted. I sent the refills to [facility] today as they had sent [place] a request.

## 2022-06-18 NOTE — Telephone Encounter (Signed)
Joseph Art is calling from [facility] stated Botox 200 will be delivered on 2/27 with signature request.

## 2022-06-23 ENCOUNTER — Emergency Department (HOSPITAL_COMMUNITY)
Admission: EM | Admit: 2022-06-23 | Discharge: 2022-06-23 | Disposition: A | Discharge status: Home / Self Care | Payer: Medicare Other | Attending: Emergency Medicine | Admitting: Emergency Medicine

## 2022-06-23 ENCOUNTER — Encounter (HOSPITAL_COMMUNITY): Payer: Self-pay

## 2022-06-23 DIAGNOSIS — G43809 Other migraine, not intractable, without status migrainosus: Secondary | ICD-10-CM | POA: Diagnosis not present

## 2022-06-23 DIAGNOSIS — J449 Chronic obstructive pulmonary disease, unspecified: Secondary | ICD-10-CM | POA: Diagnosis not present

## 2022-06-23 DIAGNOSIS — Z7951 Long term (current) use of inhaled steroids: Secondary | ICD-10-CM | POA: Diagnosis not present

## 2022-06-23 DIAGNOSIS — R519 Headache, unspecified: Secondary | ICD-10-CM | POA: Diagnosis present

## 2022-06-23 MED ORDER — METOCLOPRAMIDE HCL 10 MG PO TABS
10.0000 mg | ORAL_TABLET | Freq: Once | ORAL | Status: DC
  Filled 2022-06-23: qty 1

## 2022-06-23 MED ORDER — METOCLOPRAMIDE HCL 5 MG/ML IJ SOLN
10.0000 mg | Freq: Once | INTRAMUSCULAR | Status: AC
  Administered 2022-06-23: 10 mg via INTRAVENOUS
  Filled 2022-06-23: qty 2

## 2022-06-23 MED ORDER — MAGNESIUM SULFATE 2 GM/50ML IV SOLN
2.0000 g | Freq: Once | INTRAVENOUS | Status: AC
  Administered 2022-06-23: 2 g via INTRAVENOUS
  Filled 2022-06-23: qty 50

## 2022-06-23 MED ORDER — SODIUM CHLORIDE 0.9 % IV BOLUS
1000.0000 mL | Freq: Once | INTRAVENOUS | Status: AC
  Administered 2022-06-23: 1000 mL via INTRAVENOUS

## 2022-06-23 NOTE — Discharge Instructions (Signed)
Continue your home migraine regiment and return to the ED as needed for worsening headache or failure to improve despite treatment. Please return for any new numbness, tingling, weakness, vision changes or significantly worsening headache.

## 2022-06-23 NOTE — ED Provider Notes (Signed)
[place] AT [facility] Provider Note   CSN: [id number] Arrival date & time: 06/23/22  1442     History  Chief Complaint  Patient presents with   Migraine    Kyle Walters is a 67 y.o. male with a past medical history significant for COPD, BPH, GERD, depression, anxiety, chronic migraines who presents with concern for migraine-like headache for the last 7 out of 8 days.  Patient reports that he has some occasional aura of balance changes which feels similar today compared to previous, no new falls, numbness, vision changes.  Patient reports that he takes Emgality, gets Botox injections, uses Roselyn Meier, and has been taking all of his medication as prescribed, as well as taking Tylenol last several days.  Patient reports that he does respond well to IV medications, because he is currently taking a blood thinner he cannot take any Toradol, patient reports he cannot take Benadryl secondary to history of urinary retention, and he reports that steroids worsen his mental health.   Migraine       Home Medications Prior to Admission medications   Medication Sig Start Date End Date Taking? Authorizing Provider  acetaminophen (TYLENOL) 500 MG tablet Take 1,000 mg by mouth every 6 (six) hours as needed for mild pain.    [provider]  albuterol (VENTOLIN HFA) 108 (90 Base) MCG/ACT inhaler Inhale 2 puffs into the lungs every 6 (six) hours as needed for wheezing or shortness of breath. Patient not taking: Reported on 05/16/2022 09/07/21   Collene Gobble, MD  ALPRAZolam Duanne Moron) 1 MG tablet Take 1 mg by mouth 4 (four) times daily.     [provider]  Atogepant (QULIPTA) 60 MG TABS Take by mouth daily.    [provider]  B Complex-Biotin-FA (B-50 COMPLEX PO) Take 1 capsule by mouth daily.    [provider]  botulinum toxin Type A (BOTOX) 200 units injection PROVIDER TO INJECT 155 UNITS INTO MUSCLES OF HEAD AND NECK EVERY 3  MONTHS. DISCARD REMAINDER. 06/14/22   Melvenia Beam, MD  BREO ELLIPTA 100-25 MCG/ACT AEPB TAKE 1 PUFF BY MOUTH EVERY DAY 03/28/22   Collene Gobble, MD  Cholecalciferol (VITAMIN D3) 2000 UNITS TABS Take 4,000 Units by mouth daily after breakfast.    [provider]  clopidogrel (PLAVIX) 75 MG tablet TAKE 1 TABLET BY MOUTH DAILY WITH BREAKFAST. 05/28/22   Jettie Booze, MD  doxazosin (CARDURA) 8 MG tablet Take 8 mg by mouth daily.    [provider]  escitalopram (LEXAPRO) 20 MG tablet Take 10 mg by mouth in the morning. 10/26/14   [provider]  Ferrous Sulfate (IRON PO) Take 65 mg by mouth daily after breakfast. Hema Plex Iron    [provider]  gabapentin (NEURONTIN) 400 MG capsule Take 400 mg by mouth 4 (four) times daily.     [provider]  Galcanezumab-gnlm (EMGALITY) 120 MG/ML SOAJ Inject 120 mg into the skin every 30 (thirty) days. 02/22/22   Melvenia Beam, MD  MAGNESIUM MALATE PO Take 400 mg by mouth daily after breakfast.    [provider]  memantine (NAMENDA) 10 MG tablet TAKE 2 TABLETS BY MOUTH IN THE MORNING AND 1 TABLET IN THE LATE AFTERNOON 02/08/22   Melvenia Beam, MD  Multiple Vitamin (MULTIVITAMIN WITH MINERALS) TABS tablet Take 1 tablet by mouth daily after breakfast. Centrum Silver for Men    [provider]  Nerve Stimulator (NERIVIO) DEVI  USE WITHIN 1 HR OF MIGRAINE. SET A STRONG YET COMFORTABLE INTENSITY LEVEL & MAINTAIN LEVEL FOR 45 MINS 04/20/21   Melvenia Beam, MD  nitroGLYCERIN (NITROSTAT) 0.4 MG SL tablet Place 1 tablet (0.4 mg total) under the tongue every 5 (five) minutes as needed. Patient not taking: Reported on 05/16/2022 03/14/21   Cheryln Manly, NP  Omega-3 Fatty Acids (FISH OIL) 1200 MG CAPS Take 1,200 mg by mouth daily after breakfast.    [provider]  pantoprazole (PROTONIX) 20 MG tablet Take 1 tablet (20 mg total) by mouth daily. 09/06/21   Jettie Booze, MD   phenylephrine-shark liver oil-mineral oil-petrolatum (PREPARATION H) 0.25-14-74.9 % rectal ointment Place 1 application rectally 2 (two) times daily as needed for hemorrhoids.    [provider]  rosuvastatin (CRESTOR) 20 MG tablet TAKE 1 TABLET BY MOUTH EVERY DAY 01/16/22   Jettie Booze, MD  silodosin (RAPAFLO) 8 MG CAPS capsule Take 8 mg by mouth daily with breakfast.    [provider]  Ubrogepant (UBRELVY) 100 MG TABS TAKE 1 TABLET MOUTH AS NEEDED AS CLOSE TO MIGRAINE ONSET AS POSSIBLE. MAY REPEAT IN 2 HOURS IF NEEDED. MAX 200 MG PER 24 HOURS. Patient taking differently: Take 100 mg by mouth as directed. Take 1 tablet as close to migraine onset as possible. May repeat in 2 hours if needed, max dose 200 mg /daily. 06/01/21   Melvenia Beam, MD  vitamin B-12 (CYANOCOBALAMIN) 500 MCG tablet Take 500 mcg by mouth daily.    [provider]  warfarin (COUMADIN) 5 MG tablet TAKE 1/2 TO 1 TABLET BY MOUTH ONCE DAILY AS DIRECTED BY ANTICOAGULATION CLINIC 12/04/21   Jettie Booze, MD      Allergies    Tetracycline, Triptans, Effexor [venlafaxine hcl], Prednisone, and Sulfamethoxazole-trimethoprim    Review of Systems   Review of Systems  All other systems reviewed and are negative.   Physical Exam Updated Vital Signs BP 134/65 (BP Location: Right Arm)   Pulse 64   Temp 97.6 F (36.4 C) (Oral)   Resp 18   SpO2 94%  Physical Exam Vitals and nursing note reviewed.  Constitutional:      General: He is not in acute distress.    Appearance: Normal appearance.  HENT:     Head: Normocephalic and atraumatic.  Eyes:     General:        Right eye: No discharge.        Left eye: No discharge.  Cardiovascular:     Rate and Rhythm: Normal rate and regular rhythm.     Heart sounds: No murmur heard.    No friction rub. No gallop.  Pulmonary:     Effort: Pulmonary effort is normal.     Breath sounds: Normal breath sounds.  Abdominal:     General: Bowel  sounds are normal.     Palpations: Abdomen is soft.  Skin:    General: Skin is warm and dry.     Capillary Refill: Capillary refill takes less than 2 seconds.  Neurological:     Mental Status: He is alert and oriented to person, place, and time.     Comments: Moves all 4 limbs spontaneously, CN II through XII grossly intact, can ambulate without difficulty, intact sensation throughout.   Psychiatric:        Mood and Affect: Mood normal.        Behavior: Behavior normal.     ED Results / Procedures /  Treatments   Labs (all labs ordered are listed, but only abnormal results are displayed) Labs Reviewed - No data to display  EKG None  Radiology No results found.  Procedures Procedures    Medications Ordered in ED Medications  magnesium sulfate IVPB 2 g 50 mL (2 g Intravenous New Bag/Given 06/23/22 1557)  sodium chloride 0.9 % bolus 1,000 mL (1,000 mLs Intravenous New Bag/Given 06/23/22 1557)  metoCLOPramide (REGLAN) injection 10 mg (10 mg Intravenous Given 06/23/22 1556)    ED Course/ Medical Decision Making/ A&P                             Medical Decision Making Risk Prescription drug management.   This patient is a 67 y.o. male who presents to the ED for concern of migraine headache.   Differential diagnoses prior to evaluation: Intracranial hemorrhage, meningitis, CVA, intracranial tumor, venous sinus thrombosis, migraine, cluster headache, hypertension, drug related, pseudotumor cerebri, AVM, head injury, tension headache, sinusitis, dental abscess, otitis media, TMJ, depression  Past Medical History / Social History / Additional history: Chart reviewed. Pertinent results include: migraines, anxiety, depression, fibromyalgia  Physical Exam: Physical exam performed. The pertinent findings include: no focal neurologic deficits, patient without any vital sign abnormalities on my exam  Medications / Treatment: Administered modified migraine cocktail with reglan,  fluids, magnesium, on reassessment patient reports: feeling somewhat improved, feels ready for discharge at this time   Disposition: After consideration of the diagnostic results and the patients response to treatment, I feel that patient is stable for discharge at this time, discussed I encourage follow up with his neurologist .   emergency department workup does not suggest an emergent condition requiring admission or immediate intervention beyond what has been performed at this time. The plan is: as above. The patient is safe for discharge and has been instructed to return immediately for worsening symptoms, change in symptoms or any other concerns.  Final Clinical Impression(s) / ED Diagnoses Final diagnoses:  Other migraine without status migrainosus, not intractable    Rx / DC Orders ED Discharge Orders     None         Dorien Chihuahua 06/23/22 1654    Valarie Merino, MD 06/24/22 0009

## 2022-06-23 NOTE — ED Triage Notes (Signed)
Pt arrived via POV, c/o migraine that continues to return for over a week. States hx of migraines, but is normally able to control at home.

## 2022-06-26 ENCOUNTER — Telehealth: Payer: Self-pay

## 2022-06-26 NOTE — Telephone Encounter (Signed)
        Patient  visited [facility] on 06/23/2022  for migraine.   Telephone encounter attempt :  1st  A HIPAA compliant voice message was left requesting a return call.  Instructed patient to call back at [phone].   [place] Resource Care Guide   ??[email or web address]  ?? [id number]   Website: [email or web address]  [email or web address]

## 2022-06-27 ENCOUNTER — Ambulatory Visit: Payer: Medicare Other | Attending: Cardiovascular Disease | Admitting: *Deleted

## 2022-06-27 ENCOUNTER — Telehealth: Payer: Self-pay

## 2022-06-27 DIAGNOSIS — I513 Intracardiac thrombosis, not elsewhere classified: Secondary | ICD-10-CM | POA: Diagnosis not present

## 2022-06-27 DIAGNOSIS — Z5181 Encounter for therapeutic drug level monitoring: Secondary | ICD-10-CM | POA: Diagnosis not present

## 2022-06-27 LAB — POCT INR: INR: 2.4 (ref 2.0–3.0)

## 2022-06-27 NOTE — Telephone Encounter (Signed)
        Patient  visited [facility] on 06/23/2022  for migraine.   Telephone encounter attempt :  2nd  A HIPAA compliant voice message was left requesting a return call.  Instructed patient to call back at [phone].   [place] Resource Care Guide   ??[email or web address]  ?? [id number]   Website: [email or web address]  [email or web address]

## 2022-06-27 NOTE — Patient Instructions (Addendum)
Description   DO NOT HIGHLIGHT PAPERS & GIVE PRINTED PAPERWORK TO WIFE-THESE THINGS TRIGGER MIGRAINES. Continue taking 1 tablet daily EXCEPT 0.5 tablet on Mondays, Wednesdays, and Fridays. Continue eating leafy veggies daily as normal. Recheck INR in 4 weeks.  Anticoagulation Clinic [phone]

## 2022-06-28 ENCOUNTER — Other Ambulatory Visit: Payer: Self-pay | Admitting: Neurology

## 2022-06-28 ENCOUNTER — Telehealth: Payer: Self-pay

## 2022-06-28 NOTE — Telephone Encounter (Signed)
        Patient  visited [facility] on 06/23/2022  for migraine.   Telephone encounter attempt :  3rd  A HIPAA compliant voice message was left requesting a return call.  Instructed patient to call back at [phone].   [place] Resource Care Guide   ??[email or web address]  ?? [id number]   Website: [email or web address]

## 2022-07-03 ENCOUNTER — Telehealth: Payer: Self-pay | Admitting: Interventional Cardiology

## 2022-07-03 DIAGNOSIS — R198 Other specified symptoms and signs involving the digestive system and abdomen: Secondary | ICD-10-CM | POA: Diagnosis not present

## 2022-07-03 DIAGNOSIS — K648 Other hemorrhoids: Secondary | ICD-10-CM | POA: Diagnosis not present

## 2022-07-03 NOTE — Progress Notes (Unsigned)
07/04/22: Botox continues to work well for him.  He states that last month he had more headaches than typical.  H   1213/23: Saw dr. Jaynee Eagles in November to discussed meds. [facility]erral to academic center was mentioned.  Reports starting Emgality has helped  01/10/2022: Had a migraine yesterday. Saw Dr. Jaynee Eagles in June to discuss Medication.   10/10/2021:  Had more headaches in may. Otherwise doing ok.   BOTOX PROCEDURE NOTE FOR MIGRAINE HEADACHE    Contraindications and precautions discussed with patient(above). Aseptic procedure was observed and patient tolerated procedure. Procedure performed by Ward Givens, NP  The condition has existed for more than 6 months, and pt does not have a diagnosis of ALS, Myasthenia Gravis or Lambert-Eaton Syndrome.  Risks and benefits of injections discussed and pt agrees to proceed with the procedure.  Written consent obtained  These injections are medically necessary.  These injections do not cause sedations or hallucinations which the oral therapies may cause.  Indication/Diagnosis: chronic migraine BOTOX(J0585) injection was performed according to protocol by [employer]. 200 units of BOTOX was dissolved into 4 cc NS.   NDC: [id number]  Botox- 200 units x 1 vial Lot: [id number] Expiration: 09/2024 NDC: [id number]   Bacteriostatic 0.9% Sodium Chloride- 4 mL total Lot: [id number] Expiration: 11/25 NDC: [id number]      Description of procedure:  The patient was placed in a sitting position. The standard protocol was used for Botox as follows, with 5 units of Botox injected at each site:   -Procerus muscle, midline injection  -Corrugator muscle, bilateral injection  -Frontalis muscle, bilateral injection, with 2 sites each side, medial injection was performed in the upper one third of the frontalis muscle, in the region vertical from the medial inferior edge of the superior orbital rim. The lateral injection was again in the upper one third of  the forehead vertically above the lateral limbus of the cornea, 1.5 cm lateral to the medial injection site.  -Temporalis muscle injection, 4 sites, bilaterally. The first injection was 3 cm above the tragus of the ear, second injection site was 1.5 cm to 3 cm up from the first injection site in line with the tragus of the ear. The third injection site was 1.5-3 cm forward between the first 2 injection sites. The fourth injection site was 1.5 cm posterior to the second injection site.  -Occipitalis muscle injection, 3 sites, bilaterally. The first injection was done one half way between the occipital protuberance and the tip of the mastoid process behind the ear. The second injection site was done lateral and superior to the first, 1 fingerbreadth from the first injection. The third injection site was 1 fingerbreadth superiorly and medially from the first injection site.  -Cervical paraspinal muscle injection, 2 sites, bilateral knee first injection site was 1 cm from the midline of the cervical spine, 3 cm inferior to the lower border of the occipital protuberance. The second injection site was 1.5 cm superiorly and laterally to the first injection site.  -Trapezius muscle injection was performed at 3 sites, bilaterally. The first injection site was in the upper trapezius muscle halfway between the inflection point of the neck, and the acromion. The second injection site was one half way between the acromion and the first injection site. The third injection was done between the first injection site and the inflection point of the neck.   Will return for repeat injection in 3 months.   A 200 units of Botox was used, 155 units were  injected, the rest of the Botox was wasted. The patient tolerated the procedure well, there were no complications of the above procedure.  Ward Givens, MSN, NP-C 07/03/2022, 3:24 PM [facility] [address] [place], [place] [address] [phone]

## 2022-07-03 NOTE — Telephone Encounter (Signed)
Yes this is fine to use.

## 2022-07-03 NOTE — Telephone Encounter (Signed)
Pt c/o medication issue:  1. Name of Medication: Preparation H Suppository    2. How are you currently taking this medication (dosage and times per day)? No   3. Are you having a reaction (difficulty breathing--STAT)?   4. What is your medication issue? Patient states he needs to know if this will be okay to take this. Please advise.

## 2022-07-03 NOTE — Telephone Encounter (Signed)
Patient notified

## 2022-07-04 ENCOUNTER — Ambulatory Visit: Payer: Medicare Other | Admitting: Adult Health

## 2022-07-04 DIAGNOSIS — G43711 Chronic migraine without aura, intractable, with status migrainosus: Secondary | ICD-10-CM

## 2022-07-04 MED ORDER — ONABOTULINUMTOXINA 200 UNITS IJ SOLR
155.0000 [IU] | Freq: Once | INTRAMUSCULAR | Status: AC
  Administered 2022-07-04: 155 [IU] via INTRAMUSCULAR

## 2022-07-04 NOTE — Progress Notes (Signed)
Botox- 200 units x 1 vial Lot: [id number] Expiration: 09/2024 NDC: [id number]  Bacteriostatic 0.9% Sodium Chloride- 4 mL total Lot: [id number] Expiration: 11/25 NDC: [id number]  Dx: [id number] S/P  Witnessed by [facility] RN

## 2022-07-14 ENCOUNTER — Other Ambulatory Visit: Payer: Self-pay | Admitting: Interventional Cardiology

## 2022-07-14 DIAGNOSIS — Z5181 Encounter for therapeutic drug level monitoring: Secondary | ICD-10-CM

## 2022-07-14 DIAGNOSIS — I513 Intracardiac thrombosis, not elsewhere classified: Secondary | ICD-10-CM

## 2022-07-19 ENCOUNTER — Ambulatory Visit: Payer: Medicare Other | Admitting: Gastroenterology

## 2022-07-19 ENCOUNTER — Encounter: Payer: Self-pay | Admitting: Gastroenterology

## 2022-07-19 VITALS — BP 110/76 | HR 92 | Ht 67.0 in | Wt 167.0 lb

## 2022-07-19 DIAGNOSIS — K648 Other hemorrhoids: Secondary | ICD-10-CM | POA: Diagnosis not present

## 2022-07-19 DIAGNOSIS — K219 Gastro-esophageal reflux disease without esophagitis: Secondary | ICD-10-CM

## 2022-07-19 NOTE — Progress Notes (Addendum)
[facility] Progress Note  Chief Complaint:  Chief Complaint  Patient presents with   Gastroesophageal Reflux    Pt states his acid reflux is much better    Subjective  History: Kyle Walters was seen 2 months ago for longstanding reflux symptoms on PPI for decades with desire to decrease medicines if possible.  He was changed from PPI to H2 blocker.  He also had symptomatic large prolapsing internal hemorrhoids with burning and other discomfort.  I referred him to Dr. Drue Flirt of colorectal surgery on 07/03/2022, and she recommended a trial of MiraLAX to help relieve constipation as well as use of OTC Preparation H suppositories.  Patient was instructed contact her if insufficient relief from this.  He is accompanied by his wife for today's visit.   Today, he continues to complain of hemorrhoids. He reports that his hemorrhoids are worse when having diarrhea. He also reports that he's been experiencing some mild heartburn and states that famotidine has improved his symptoms. He reports alternating constipation and diarrhea descried as loose stool.   He reports taking celium fiber but denies starting the Miralax recommendation as her forgot too.  He states that he's lost about 30-40 pounds after having his heart attack years ago and stop smoking, so he feels that likely contributed to reduction in reflux.Marland Kitchen   He states that he's not interested in colorectal surgery per his visit with NP Ollen Gross (Dr. Drue Flirt NP) as he's unable to stop his Plavix for 4-6 weeks or Coumadin for longer than several days.  He was told that hemorrhoidal surgery would still lead to bleeding for weeks afterwards and the does not feel he can do that.  It does not seem that the question of whether or not colorectal surgery thought his hemorrhoids were amenable to banding was answered..   ROS: Review of Systems  Constitutional:  Negative for appetite change and fever.  HENT:  Negative for trouble swallowing.    Respiratory:  Negative for cough and shortness of breath.   Cardiovascular:  Negative for chest pain.  Gastrointestinal:  Positive for blood in stool, constipation and diarrhea. Negative for abdominal distention, abdominal pain, anal bleeding, nausea, rectal pain and vomiting.  Genitourinary:  Negative for dysuria.  Musculoskeletal:  Negative for back pain.  Skin:  Negative for rash.  Neurological:  Negative for weakness.  All other systems reviewed and are negative.  The patient's Past Medical, Family and Social History were reviewed and are on file in the EMR.  Objective:  Med list reviewed  Current Outpatient Medications:    acetaminophen (TYLENOL) 500 MG tablet, Take 1,000 mg by mouth every 6 (six) hours as needed for mild pain., Disp: , Rfl:    albuterol (VENTOLIN HFA) 108 (90 Base) MCG/ACT inhaler, Inhale 2 puffs into the lungs every 6 (six) hours as needed for wheezing or shortness of breath., Disp: 8 g, Rfl: 2   ALPRAZolam (XANAX) 1 MG tablet, Take 1 mg by mouth 4 (four) times daily. , Disp: , Rfl:    Atogepant (QULIPTA) 60 MG TABS, Take by mouth daily., Disp: , Rfl:    B Complex-Biotin-FA (B-50 COMPLEX PO), Take 1 capsule by mouth daily., Disp: , Rfl:    botulinum toxin Type A (BOTOX) 200 units injection, PROVIDER TO INJECT 155 UNITS INTO MUSCLES OF HEAD AND NECK EVERY 3 MONTHS. DISCARD REMAINDER., Disp: 1 each, Rfl: 3   BREO ELLIPTA 100-25 MCG/ACT AEPB, TAKE 1 PUFF BY MOUTH EVERY DAY, Disp: 60 each, Rfl:  2   Cholecalciferol (VITAMIN D3) 2000 UNITS TABS, Take 4,000 Units by mouth daily after breakfast., Disp: , Rfl:    clopidogrel (PLAVIX) 75 MG tablet, TAKE 1 TABLET BY MOUTH DAILY WITH BREAKFAST., Disp: 90 tablet, Rfl: 3   doxazosin (CARDURA) 8 MG tablet, Take 8 mg by mouth daily., Disp: , Rfl:    escitalopram (LEXAPRO) 20 MG tablet, Take 10 mg by mouth in the morning., Disp: , Rfl: 2   Ferrous Sulfate (IRON PO), Take 65 mg by mouth daily after breakfast. Hema Plex Iron, Disp:  , Rfl:    gabapentin (NEURONTIN) 400 MG capsule, Take 400 mg by mouth 4 (four) times daily. , Disp: , Rfl:    Galcanezumab-gnlm (EMGALITY) 120 MG/ML SOAJ, Inject 120 mg into the skin every 30 (thirty) days., Disp: 1.12 mL, Rfl: 11   MAGNESIUM MALATE PO, Take 400 mg by mouth daily after breakfast., Disp: , Rfl:    memantine (NAMENDA) 10 MG tablet, TAKE 2 TABLETS BY MOUTH IN THE MORNING AND 1 TABLET IN THE LATE AFTERNOON, Disp: 90 tablet, Rfl: 0   Multiple Vitamin (MULTIVITAMIN WITH MINERALS) TABS tablet, Take 1 tablet by mouth daily after breakfast. Centrum Silver for Men, Disp: , Rfl:    Nerve Stimulator (NERIVIO) DEVI, USE WITHIN 1 HR OF MIGRAINE. SET A STRONG YET COMFORTABLE INTENSITY LEVEL & MAINTAIN LEVEL FOR 45 MINS, Disp: 1 each, Rfl: 0   nitroGLYCERIN (NITROSTAT) 0.4 MG SL tablet, Place 1 tablet (0.4 mg total) under the tongue every 5 (five) minutes as needed., Disp: 25 tablet, Rfl: 2   Omega-3 Fatty Acids (FISH OIL) 1200 MG CAPS, Take 1,200 mg by mouth daily after breakfast., Disp: , Rfl:    phenylephrine-shark liver oil-mineral oil-petrolatum (PREPARATION H) 0.25-14-74.9 % rectal ointment, Place 1 application rectally 2 (two) times daily as needed for hemorrhoids., Disp: , Rfl:    raNITIdine HCl (ZANTAC PO), Take by mouth every other day., Disp: , Rfl:    rosuvastatin (CRESTOR) 20 MG tablet, TAKE 1 TABLET BY MOUTH EVERY DAY, Disp: 90 tablet, Rfl: 3   silodosin (RAPAFLO) 8 MG CAPS capsule, Take 8 mg by mouth daily with breakfast., Disp: , Rfl:    Ubrogepant (UBRELVY) 100 MG TABS, TAKE 1 TABLET MOUTH AS NEEDED AS CLOSE TO MIGRAINE ONSET AS POSSIBLE. MAY REPEAT IN 2 HOURS IF NEEDED. MAX 200 MG PER 24 HOURS. (Patient taking differently: Take 100 mg by mouth as directed. Take 1 tablet as close to migraine onset as possible. May repeat in 2 hours if needed, max dose 200 mg /daily.), Disp: 16 tablet, Rfl: 11   vitamin B-12 (CYANOCOBALAMIN) 500 MCG tablet, Take 500 mcg by mouth daily., Disp: , Rfl:     warfarin (COUMADIN) 5 MG tablet, TAKE 1/2 TO 1 TABLET BY MOUTH ONCE DAILY AS DIRECTED BY ANTICOAGULATION CLINIC, Disp: 100 tablet, Rfl: 1   pantoprazole (PROTONIX) 20 MG tablet, Take 1 tablet (20 mg total) by mouth daily., Disp: 90 tablet, Rfl: 1   Vital signs in last 24 hrs: Vitals:   07/19/22 1453  BP: 110/76  Pulse: 92   Wt Readings from Last 3 Encounters:  07/19/22 167 lb (75.8 kg)  05/16/22 169 lb (76.7 kg)  04/27/22 164 lb (74.4 kg)    Physical Exam Well-appearing No additional exam.  Entire visit spent in review symptoms results and plan.  Labs:   ___________________________________________ Radiologic studies:   ____________________________________________ Other:   _____________________________________________ Assessment & Plan  Assessment: Gastroesophageal reflux disease without esophagitis  Bleeding  internal hemorrhoids  Our plan to de-escalate acid suppression and slowly wean off treatment seems to be working so far.  He is currently on famotidine 1 tablet every other day, and I recommended he stop it and take that medicine as needed. Plan: -Continue famotidine PRN  -follow up with Dr. Drue Flirt regarding his hemorrhoids and particularly question of whether she thinks they are amenable to banding and if she would offer that to him.  20 minutes were spent on this encounter (including chart review, history/exam, counseling/coordination of care, and documentation) > 50% of that time was spent on counseling and coordination of care.    I,Safa M Kadhim,acting as a scribe for [place], MD.,have documented all relevant documentation on the behalf of Kyle Stabler, MD,as directed by  Kyle Stabler, MD while in the presence of Kyle Stabler, MD.   Kyle April III, MD, have reviewed all documentation for this visit. The documentation on 07/19/22 for the exam, diagnosis, procedures, and orders are all accurate and complete.    Kyle Lund,  MD    [facility]

## 2022-07-19 NOTE — Patient Instructions (Signed)
Follow up as needed  It was a pleasure to see you today!  Thank you for trusting me with your gastrointestinal care!      If your blood pressure at your visit was 140/90 or greater, please contact your primary care physician to follow up on this.  _______________________________________________________  If you are age 68 or older, your body mass index should be between 23-30. Your Body mass index is 26.16 kg/m. If this is out of the aforementioned range listed, please consider follow up with your Primary Care Provider.  If you are age 48 or younger, your body mass index should be between 19-25. Your Body mass index is 26.16 kg/m. If this is out of the aformentioned range listed, please consider follow up with your Primary Care Provider.   ________________________________________________________  The [facility] providers would like to encourage you to use [facility] to communicate with providers for non-urgent requests or questions.  Due to long hold times on the telephone, sending your provider a message by [facility] may be a faster and more efficient way to get a response.  Please allow 48 business hours for a response.  Please remember that this is for non-urgent requests.  _______________________________________________________  Due to recent changes in healthcare laws, you may see the results of your imaging and laboratory studies on MyChart before your provider has had a chance to review them.  We understand that in some cases there may be results that are confusing or concerning to you. Not all laboratory results come back in the same time frame and the provider may be waiting for multiple results in order to interpret others.  Please give [place] 48 hours in order for your provider to thoroughly review all the results before contacting the office for clarification of your results.

## 2022-07-19 NOTE — Progress Notes (Deleted)
[facility] Progress Note  Chief Complaint: Heartburn and internal hemorrhoids  Subjective  History: [name] was seen 2 months ago for longstanding reflux symptoms on PPI for decades with desire to decrease medicines if possible.  He was changed from PPI to H2 blocker.  He also had symptomatic large prolapsing internal hemorrhoids with burning and other discomfort.  I referred him to Dr. Drue Flirt of colorectal surgery on 07/03/2022, and she recommended a trial of MiraLAX to help relieve constipation as well as use of OTC Preparation H suppositories.  Patient was instructed contact her if insufficient relief from this.  [name] was  ROS: Cardiovascular:  no chest pain Respiratory: no dyspnea  The patient's Past Medical, Family and Social History were reviewed and are on file in the EMR.  Objective:  Med list reviewed  Current Outpatient Medications:    acetaminophen (TYLENOL) 500 MG tablet, Take 1,000 mg by mouth every 6 (six) hours as needed for mild pain., Disp: , Rfl:    albuterol (VENTOLIN HFA) 108 (90 Base) MCG/ACT inhaler, Inhale 2 puffs into the lungs every 6 (six) hours as needed for wheezing or shortness of breath., Disp: 8 g, Rfl: 2   ALPRAZolam (XANAX) 1 MG tablet, Take 1 mg by mouth 4 (four) times daily. , Disp: , Rfl:    Atogepant (QULIPTA) 60 MG TABS, Take by mouth daily., Disp: , Rfl:    B Complex-Biotin-FA (B-50 COMPLEX PO), Take 1 capsule by mouth daily., Disp: , Rfl:    botulinum toxin Type A (BOTOX) 200 units injection, PROVIDER TO INJECT 155 UNITS INTO MUSCLES OF HEAD AND NECK EVERY 3 MONTHS. DISCARD REMAINDER., Disp: 1 each, Rfl: 3   BREO ELLIPTA 100-25 MCG/ACT AEPB, TAKE 1 PUFF BY MOUTH EVERY DAY, Disp: 60 each, Rfl: 2   Cholecalciferol (VITAMIN D3) 2000 UNITS TABS, Take 4,000 Units by mouth daily after breakfast., Disp: , Rfl:    clopidogrel (PLAVIX) 75 MG tablet, TAKE 1 TABLET BY MOUTH DAILY WITH BREAKFAST., Disp: 90 tablet, Rfl: 3   doxazosin (CARDURA) 8 MG  tablet, Take 8 mg by mouth daily., Disp: , Rfl:    escitalopram (LEXAPRO) 20 MG tablet, Take 10 mg by mouth in the morning., Disp: , Rfl: 2   Ferrous Sulfate (IRON PO), Take 65 mg by mouth daily after breakfast. Hema Plex Iron, Disp: , Rfl:    gabapentin (NEURONTIN) 400 MG capsule, Take 400 mg by mouth 4 (four) times daily. , Disp: , Rfl:    Galcanezumab-gnlm (EMGALITY) 120 MG/ML SOAJ, Inject 120 mg into the skin every 30 (thirty) days., Disp: 1.12 mL, Rfl: 11   MAGNESIUM MALATE PO, Take 400 mg by mouth daily after breakfast., Disp: , Rfl:    memantine (NAMENDA) 10 MG tablet, TAKE 2 TABLETS BY MOUTH IN THE MORNING AND 1 TABLET IN THE LATE AFTERNOON, Disp: 90 tablet, Rfl: 0   Multiple Vitamin (MULTIVITAMIN WITH MINERALS) TABS tablet, Take 1 tablet by mouth daily after breakfast. Centrum Silver for Men, Disp: , Rfl:    Nerve Stimulator (NERIVIO) DEVI, USE WITHIN 1 HR OF MIGRAINE. SET A STRONG YET COMFORTABLE INTENSITY LEVEL & MAINTAIN LEVEL FOR 45 MINS, Disp: 1 each, Rfl: 0   nitroGLYCERIN (NITROSTAT) 0.4 MG SL tablet, Place 1 tablet (0.4 mg total) under the tongue every 5 (five) minutes as needed., Disp: 25 tablet, Rfl: 2   Omega-3 Fatty Acids (FISH OIL) 1200 MG CAPS, Take 1,200 mg by mouth daily after breakfast., Disp: , Rfl:  phenylephrine-shark liver oil-mineral oil-petrolatum (PREPARATION H) 0.25-14-74.9 % rectal ointment, Place 1 application rectally 2 (two) times daily as needed for hemorrhoids., Disp: , Rfl:    raNITIdine HCl (ZANTAC PO), Take by mouth every other day., Disp: , Rfl:    rosuvastatin (CRESTOR) 20 MG tablet, TAKE 1 TABLET BY MOUTH EVERY DAY, Disp: 90 tablet, Rfl: 3   silodosin (RAPAFLO) 8 MG CAPS capsule, Take 8 mg by mouth daily with breakfast., Disp: , Rfl:    Ubrogepant (UBRELVY) 100 MG TABS, TAKE 1 TABLET MOUTH AS NEEDED AS CLOSE TO MIGRAINE ONSET AS POSSIBLE. MAY REPEAT IN 2 HOURS IF NEEDED. MAX 200 MG PER 24 HOURS. (Patient taking differently: Take 100 mg by mouth as  directed. Take 1 tablet as close to migraine onset as possible. May repeat in 2 hours if needed, max dose 200 mg /daily.), Disp: 16 tablet, Rfl: 11   vitamin B-12 (CYANOCOBALAMIN) 500 MCG tablet, Take 500 mcg by mouth daily., Disp: , Rfl:    warfarin (COUMADIN) 5 MG tablet, TAKE 1/2 TO 1 TABLET BY MOUTH ONCE DAILY AS DIRECTED BY ANTICOAGULATION CLINIC, Disp: 100 tablet, Rfl: 1   pantoprazole (PROTONIX) 20 MG tablet, Take 1 tablet (20 mg total) by mouth daily., Disp: 90 tablet, Rfl: 1   Vital signs in last 24 hrs: Vitals:   07/19/22 1453  BP: 110/76  Pulse: 92   Wt Readings from Last 3 Encounters:  07/19/22 167 lb (75.8 kg)  05/16/22 169 lb (76.7 kg)  04/27/22 164 lb (74.4 kg)    Physical Exam  *** HEENT: sclera anicteric, oral mucosa moist without lesions Neck: supple, no thyromegaly, JVD or lymphadenopathy Cardiac: ***,  no peripheral edema Pulm: clear to auscultation bilaterally, normal RR and effort noted Abdomen: soft, *** tenderness, with active bowel sounds. No guarding or palpable hepatosplenomegaly. Skin; warm and dry, no jaundice or rash  Labs:   ___________________________________________ Radiologic studies:   ____________________________________________ Other:   _____________________________________________ Assessment & Plan  Assessment: Encounter Diagnosis  Name Primary?   Gastroesophageal reflux disease without esophagitis Yes      Plan:   *** minutes were spent on this encounter (including chart review, history/exam, counseling/coordination of care, and documentation) > 50% of that time was spent on counseling and coordination of care.   Nelida Meuse III

## 2022-07-25 ENCOUNTER — Ambulatory Visit: Payer: Medicare Other | Attending: Cardiology | Admitting: *Deleted

## 2022-07-25 DIAGNOSIS — I513 Intracardiac thrombosis, not elsewhere classified: Secondary | ICD-10-CM

## 2022-07-25 DIAGNOSIS — Z5181 Encounter for therapeutic drug level monitoring: Secondary | ICD-10-CM

## 2022-07-25 LAB — POCT INR: INR: 2.3 (ref 2.0–3.0)

## 2022-07-25 NOTE — Patient Instructions (Signed)
Description   DO NOT HIGHLIGHT PAPERS & GIVE PRINTED PAPERWORK TO WIFE-THESE THINGS TRIGGER MIGRAINES. Continue taking 1 tablet daily EXCEPT 0.5 tablet on Mondays, Wednesdays, and Fridays. Continue eating leafy veggies daily as normal. Recheck INR in 5 weeks.  Anticoagulation Clinic [phone]

## 2022-08-13 ENCOUNTER — Other Ambulatory Visit: Payer: Self-pay | Admitting: Emergency Medicine

## 2022-08-14 ENCOUNTER — Encounter: Payer: Self-pay | Admitting: Neurology

## 2022-08-16 ENCOUNTER — Telehealth: Payer: Self-pay | Admitting: Neurology

## 2022-08-16 NOTE — Telephone Encounter (Signed)
Patient has agreed to cancel his July appointment and push it back. Please call and rescedule him to sometime close to the end of the year maybe November. I'd prefer if he follows up with megan and you can tell him megan and I are partners and if anything changes he can contact me thanks

## 2022-08-17 NOTE — Telephone Encounter (Signed)
R/s patient to 02/28/23 at 2:30 pm with [facility].

## 2022-08-28 ENCOUNTER — Telehealth: Payer: Self-pay | Admitting: Adult Health

## 2022-08-28 NOTE — Telephone Encounter (Signed)
Renewed Botox auth before 6/24 appt, current auth expires 6/19. [employer] auth approved: [id number] (08/28/22-08/27/23).

## 2022-08-28 NOTE — Telephone Encounter (Signed)
Also received approval via [employer] [facility] Case ID #: [id number] (08/28/22-11/28/22).

## 2022-08-29 ENCOUNTER — Ambulatory Visit: Payer: Medicare Other | Admitting: Neurology

## 2022-08-30 ENCOUNTER — Ambulatory Visit: Payer: Medicare Other | Attending: Cardiology | Admitting: *Deleted

## 2022-08-30 DIAGNOSIS — Z5181 Encounter for therapeutic drug level monitoring: Secondary | ICD-10-CM | POA: Diagnosis not present

## 2022-08-30 DIAGNOSIS — I513 Intracardiac thrombosis, not elsewhere classified: Secondary | ICD-10-CM | POA: Diagnosis not present

## 2022-08-30 LAB — POCT INR: INR: 3.1 — AB (ref 2.0–3.0)

## 2022-08-30 NOTE — Patient Instructions (Addendum)
Description   DO NOT HIGHLIGHT PAPERS & GIVE PRINTED PAPERWORK TO WIFE-THESE THINGS TRIGGER MIGRAINES. Today take 1/2 tablet of warfarin then continue taking 1 tablet daily EXCEPT 1/2 tablet on Mondays, Wednesdays, and Fridays. Continue eating leafy veggies daily as normal. Recheck INR in 4 weeks.  Anticoagulation Clinic [phone]

## 2022-09-04 NOTE — Telephone Encounter (Signed)
Chelly called from [employer]. Botox 200 Units will be delivered on 5/21/204.

## 2022-09-05 DIAGNOSIS — R319 Hematuria, unspecified: Secondary | ICD-10-CM | POA: Diagnosis not present

## 2022-09-05 DIAGNOSIS — N319 Neuromuscular dysfunction of bladder, unspecified: Secondary | ICD-10-CM | POA: Diagnosis not present

## 2022-09-05 DIAGNOSIS — G43909 Migraine, unspecified, not intractable, without status migrainosus: Secondary | ICD-10-CM | POA: Diagnosis not present

## 2022-09-06 ENCOUNTER — Telehealth: Payer: Self-pay | Admitting: Interventional Cardiology

## 2022-09-06 NOTE — Telephone Encounter (Signed)
Patient notified

## 2022-09-06 NOTE — Telephone Encounter (Signed)
Pt c/o medication issue:  1. Name of Medication:   Phenazopyridine (Pyridium) 100 mg, take   2. How are you currently taking this medication (dosage and times per day)?  Patient states take 1 tablet by mouth once a day  3. Are you having a reaction (difficulty breathing--STAT)?   NO  4. What is your medication issue? Patient states he was recently prescribed this medication and wanted to check with RN Dennie Bible if this is OK for him to take.  Patient stated he has not started taking this medication as yet.

## 2022-09-06 NOTE — Telephone Encounter (Signed)
Ok to take pyridium, will not interact with his other meds or affect his heart.

## 2022-09-14 DIAGNOSIS — R935 Abnormal findings on diagnostic imaging of other abdominal regions, including retroperitoneum: Secondary | ICD-10-CM | POA: Diagnosis not present

## 2022-09-18 ENCOUNTER — Telehealth: Payer: Self-pay | Admitting: Interventional Cardiology

## 2022-09-18 NOTE — Telephone Encounter (Signed)
Pt c/o medication issue:  1. Name of Medication: acyclovir 800 mg   2. How are you currently taking this medication (dosage and times per day)?   3. Are you having a reaction (difficulty breathing--STAT)?   4. What is your medication issue?  Patient called wanting to know if it's okay to take this medication. Please advise.

## 2022-09-18 NOTE — Telephone Encounter (Signed)
Ok to take acyclovir

## 2022-09-18 NOTE — Telephone Encounter (Signed)
Patient would like to know if it is safe for him to take acyclovir

## 2022-09-18 NOTE — Telephone Encounter (Signed)
Spoke with to advised that it is safe for him to take acyclovir with his other medications. No other questions or concerns at this time.

## 2022-09-24 ENCOUNTER — Ambulatory Visit (HOSPITAL_COMMUNITY): Payer: Medicare Other

## 2022-09-26 ENCOUNTER — Ambulatory Visit (HOSPITAL_COMMUNITY)
Admission: RE | Admit: 2022-09-26 | Discharge: 2022-09-26 | Disposition: A | Discharge status: Home / Self Care | Payer: Medicare Other | Source: Ambulatory Visit | Attending: Acute Care | Admitting: Acute Care

## 2022-09-26 DIAGNOSIS — Z87891 Personal history of nicotine dependence: Secondary | ICD-10-CM

## 2022-09-26 DIAGNOSIS — Z122 Encounter for screening for malignant neoplasm of respiratory organs: Secondary | ICD-10-CM | POA: Diagnosis not present

## 2022-09-27 ENCOUNTER — Ambulatory Visit: Payer: Medicare Other | Attending: Cardiovascular Disease

## 2022-09-27 ENCOUNTER — Ambulatory Visit: Payer: Medicare Other | Admitting: Adult Health

## 2022-09-27 DIAGNOSIS — Z5181 Encounter for therapeutic drug level monitoring: Secondary | ICD-10-CM

## 2022-09-27 DIAGNOSIS — I513 Intracardiac thrombosis, not elsewhere classified: Secondary | ICD-10-CM

## 2022-09-27 LAB — POCT INR: INR: 3.2 — AB (ref 2.0–3.0)

## 2022-09-27 NOTE — Patient Instructions (Signed)
Description   DO NOT HIGHLIGHT PAPERS & GIVE PRINTED PAPERWORK TO WIFE-THESE THINGS TRIGGER MIGRAINES. Today take 1/2 tablet of warfarin then START taking 1 tablet daily EXCEPT 1/2 tablet on Sunday, Mondays, Wednesdays, and Fridays.  Continue eating leafy veggies daily as normal.  Recheck INR in 3 weeks.  Anticoagulation Clinic [phone]

## 2022-10-02 DIAGNOSIS — G478 Other sleep disorders: Secondary | ICD-10-CM | POA: Diagnosis not present

## 2022-10-02 DIAGNOSIS — G479 Sleep disorder, unspecified: Secondary | ICD-10-CM | POA: Diagnosis not present

## 2022-10-05 ENCOUNTER — Ambulatory Visit: Payer: Medicare Other | Admitting: Emergency Medicine

## 2022-10-05 ENCOUNTER — Encounter: Payer: Self-pay | Admitting: Emergency Medicine

## 2022-10-05 VITALS — BP 118/68 | HR 56 | Temp 98.3°F | Ht 66.0 in | Wt 166.2 lb

## 2022-10-05 DIAGNOSIS — Z87891 Personal history of nicotine dependence: Secondary | ICD-10-CM

## 2022-10-05 DIAGNOSIS — J449 Chronic obstructive pulmonary disease, unspecified: Secondary | ICD-10-CM

## 2022-10-05 NOTE — Assessment & Plan Note (Signed)
Please continue your Breo every other day as you have been taking it.  Rinse and gargle after you use it. Keep your albuterol available to use 2 puffs if needed for shortness of breath, chest tightness, wheezing.  We will refill this for you today. Please call if you have any breathing difficulty, flaring symptoms. Follow Dr. Delton Coombes in 1 year or sooner if you have problems.

## 2022-10-05 NOTE — Assessment & Plan Note (Signed)
We reviewed your lung cancer screening CT chest.  This was a reassuring study.  You need to repeat in June 2025.

## 2022-10-05 NOTE — Progress Notes (Signed)
  Subjective:    Patient ID: Kyle Walters, male    DOB: Dec 25, 1955, 48  HPI  ROV 09/07/21 --67 year old man with a history of tobacco use (60 pack years) whom I have followed for combined obstructive and restrictive disease consistent with asthmatic COPD.  He also has a history of depression and anxiety, postconcussion syndrome from a trauma.  He participates in lung cancer screening program.  His most recent CT from 09/2020 showed a possible left ventricular outpouching at the apex raising possibility of prior MI or LV aneurysm.  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 Patient is on Breo but he uses as needed - about 5 days a week because he has associated tremor.  He is on pantoprazole. He does not use albuterol, need sot be refilled. Scheduled for LDCT 09/2021.  He has been active, stationary bike. No cough.    ROV 10/05/2022 --follow-up visit for 67 year old gentleman with a history of COPD/asthma, superimposed restrictive lung disease.  Also with a history of postconcussion syndrome from a trauma, depression/anxiety.  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  He has been on Breo, uses most days but not all because he has some associated tremor as a side effect. He is using every other day. No albuterol use. He is active, is able to exercise every day. No flares, no abx or pred.   Lung cancer screening CT chest 09/26/2022 reviewed by me, shows no mediastinal or hilar adenopathy, small calcified granulomas without any suspicious nodules.  Some mild diffuse bronchial wall thickening and paraseptal emphysema.  Lung RADS 1S.  There was some aortic atherosclerosis present     Objective:   Physical Exam Vitals:   10/05/22 1445  BP: 118/68  Pulse: (!) 56  Temp: 98.3 F (36.8 C)  TempSrc: Oral  SpO2: 96%  Weight: 166 lb 3.2 oz (75.4 kg)  Height: 5\' 6"  (1.676 m)       Gen: Pleasant, well-nourished, in no distress,  normal affect  ENT: No lesions,  mouth clear,  oropharynx clear, no postnasal drip  Neck: No JVD, no stridor  Lungs: No use of accessory muscles, clear without rales or rhonchi  Cardiovascular: RRR, heart sounds normal, no murmur or gallops, no peripheral edema  Musculoskeletal: No deformities, no cyanosis or clubbing  Neuro: awake, appropriate, non-focal  Skin: Warm, no lesions or rashes     Assessment & Plan:  COPD (chronic obstructive pulmonary disease) Please continue your Breo every other day as you have been taking it.  Rinse and gargle after you use it. Keep your albuterol available to use 2 puffs if needed for shortness of breath, chest tightness, wheezing.  We will refill this for you today. Please call if you have any breathing difficulty, flaring symptoms. Follow Dr. Delton Coombes in 1 year or sooner if you have problems.  History of tobacco use We reviewed your lung cancer screening CT chest.  This was a reassuring study.  You need to repeat in June 2025.   Levy Pupa, MD, PhD [facility] [phone] or if no answer [phone]

## 2022-10-05 NOTE — Patient Instructions (Signed)
Please continue your Breo every other day as you have been taking it.  Rinse and gargle after you use it. Keep your albuterol available to use 2 puffs if needed for shortness of breath, chest tightness, wheezing.  We will refill this for you today. We reviewed your lung cancer screening CT chest.  This was a reassuring study.  You need to repeat in June 2025. Please call if you have any breathing difficulty, flaring symptoms. Follow Dr. Delton Coombes in 1 year or sooner if you have problems.

## 2022-10-07 DIAGNOSIS — L249 Irritant contact dermatitis, unspecified cause: Secondary | ICD-10-CM | POA: Diagnosis not present

## 2022-10-07 DIAGNOSIS — L03113 Cellulitis of right upper limb: Secondary | ICD-10-CM | POA: Diagnosis not present

## 2022-10-07 DIAGNOSIS — R220 Localized swelling, mass and lump, head: Secondary | ICD-10-CM | POA: Diagnosis not present

## 2022-10-08 ENCOUNTER — Telehealth: Payer: Self-pay | Admitting: Interventional Cardiology

## 2022-10-08 NOTE — Telephone Encounter (Signed)
Pt c/o medication issue:  1. Name of Medication: triamcinolone (KENALOG) 0.5% ointment  cephALEXin (KEFLEX) 500 mg capsule    2. How are you currently taking this medication (dosage and times per day)? To his wrist twice daily Take one capsule (500 mg dose) by mouth 4 (four) times daily for 10 days   3. Are you having a reaction (difficulty breathing--STAT)? no  4. What is your medication issue? Patient went to urgent care yesterday and was diagnosed with cellulitis and contact dermatitis, he was prescribed these medication, he wants to know if these medications are okay to take and will not interfere with any of his medication.

## 2022-10-08 NOTE — Telephone Encounter (Signed)
Kyle Walters, [facility]     10/08/22 12:59 PM Ok to use with his other medications  Patient notified.

## 2022-10-09 ENCOUNTER — Emergency Department (HOSPITAL_COMMUNITY)
Admission: EM | Admit: 2022-10-09 | Discharge: 2022-10-09 | Disposition: A | Discharge status: Home / Self Care | Payer: Medicare Other | Attending: Emergency Medicine | Admitting: Emergency Medicine

## 2022-10-09 ENCOUNTER — Encounter (HOSPITAL_COMMUNITY): Payer: Self-pay

## 2022-10-09 ENCOUNTER — Other Ambulatory Visit: Payer: Self-pay

## 2022-10-09 DIAGNOSIS — R21 Rash and other nonspecific skin eruption: Secondary | ICD-10-CM | POA: Diagnosis not present

## 2022-10-09 DIAGNOSIS — R5383 Other fatigue: Secondary | ICD-10-CM | POA: Diagnosis not present

## 2022-10-09 DIAGNOSIS — R600 Localized edema: Secondary | ICD-10-CM

## 2022-10-09 DIAGNOSIS — Z7951 Long term (current) use of inhaled steroids: Secondary | ICD-10-CM | POA: Insufficient documentation

## 2022-10-09 DIAGNOSIS — R609 Edema, unspecified: Secondary | ICD-10-CM | POA: Diagnosis not present

## 2022-10-09 DIAGNOSIS — R0689 Other abnormalities of breathing: Secondary | ICD-10-CM | POA: Diagnosis not present

## 2022-10-09 LAB — COMPREHENSIVE METABOLIC PANEL
ALT: 53 U/L — ABNORMAL HIGH (ref 0–44)
AST: 35 U/L (ref 15–41)
Albumin: 3.8 g/dL (ref 3.5–5.0)
Alkaline Phosphatase: 88 U/L (ref 38–126)
Anion gap: 6 (ref 5–15)
BUN: 18 mg/dL (ref 8–23)
CO2: 27 mmol/L (ref 22–32)
Calcium: 8.9 mg/dL (ref 8.9–10.3)
Chloride: 108 mmol/L (ref 98–111)
Creatinine, Ser: 0.94 mg/dL (ref 0.61–1.24)
GFR, Estimated: >60 mL/min (ref >60)
Glucose, Bld: 105 mg/dL — ABNORMAL HIGH (ref 70–99)
Potassium: 4.4 mmol/L (ref 3.5–5.1)
Sodium: 141 mmol/L (ref 135–145)
Total Bilirubin: 0.6 mg/dL (ref 0.3–1.2)
Total Protein: 7.3 g/dL (ref 6.5–8.1)

## 2022-10-09 LAB — CBC WITH DIFFERENTIAL/PLATELET
Abs Immature Granulocytes: 0.01 10*3/uL (ref 0.00–0.07)
Basophils Absolute: 0 10*3/uL (ref 0.0–0.1)
Basophils Relative: 1 %
Eosinophils Absolute: 0.2 10*3/uL (ref 0.0–0.5)
Eosinophils Relative: 3 %
HCT: 42.7 % (ref 39.0–52.0)
Hemoglobin: 13.9 g/dL (ref 13.0–17.0)
Immature Granulocytes: 0 %
Lymphocytes Relative: 30 %
Lymphs Abs: 2 10*3/uL (ref 0.7–4.0)
MCH: 31.7 pg (ref 26.0–34.0)
MCHC: 32.6 g/dL (ref 30.0–36.0)
MCV: 97.3 fL (ref 80.0–100.0)
Monocytes Absolute: 0.7 10*3/uL (ref 0.1–1.0)
Monocytes Relative: 11 %
Neutro Abs: 3.7 10*3/uL (ref 1.7–7.7)
Neutrophils Relative %: 55 %
Platelets: 183 10*3/uL (ref 150–400)
RBC: 4.39 MIL/uL (ref 4.22–5.81)
RDW: 13.2 % (ref 11.5–15.5)
WBC: 6.5 10*3/uL (ref 4.0–10.5)
nRBC: 0 % (ref 0.0–0.2)

## 2022-10-09 LAB — BLOOD GAS, VENOUS
Acid-Base Excess: 4 mmol/L — ABNORMAL HIGH (ref 0.0–2.0)
Bicarbonate: 33.8 mmol/L — ABNORMAL HIGH (ref 20.0–28.0)
O2 Saturation: 20.4 %
Patient temperature: 37
pCO2, Ven: 77 mmHg (ref 44–60)
pH, Ven: 7.25 (ref 7.25–7.43)
pO2, Ven: <31 mmHg — CL (ref 32–45)

## 2022-10-09 NOTE — Discharge Instructions (Signed)
Please make sure you are taking your Breo as prescribed, and make sure you return to the ER if you become very sleepy, short of breath, or worsening swelling of your face.  Please follow-up with your primary care doctor

## 2022-10-09 NOTE — ED Provider Notes (Signed)
[place] EMERGENCY DEPARTMENT AT [facility] Provider Note   CSN: [id number] Arrival date & time: 10/09/22  1437     History  Chief Complaint  Patient presents with   Rash    Kyle Walters is a 67 y.o. male, hx of polyclonal gammopathy, including increased fatigue for the last 4 days.  He states about 4 to 5 days ago, he rash on his bilateral arms.  He went and saw an urgent care provider, and was started on steroids and acyclovir.  He notes that this kind of went away with some time.  Notes when he had the diagnosis of polyclonal gammopathy, his symptoms were similar.    Home Medications Prior to Admission medications   Medication Sig Start Date End Date Taking? Authorizing Provider  acetaminophen (TYLENOL) 500 MG tablet Take 1,000 mg by mouth every 6 (six) hours as needed for mild pain.    [provider]  albuterol (VENTOLIN HFA) 108 (90 Base) MCG/ACT inhaler Inhale 2 puffs into the lungs every 6 (six) hours as needed for wheezing or shortness of breath. 09/07/21   Leslye Peer, MD  ALPRAZolam Prudy Feeler) 1 MG tablet Take 1 mg by mouth 4 (four) times daily.     [provider]  Atogepant (QULIPTA) 60 MG TABS Take by mouth daily.    [provider]  B Complex-Biotin-FA (B-50 COMPLEX PO) Take 1 capsule by mouth daily.    [provider]  botulinum toxin Type A (BOTOX) 200 units injection PROVIDER TO INJECT 155 UNITS INTO MUSCLES OF HEAD AND NECK EVERY 3 MONTHS. DISCARD REMAINDER. 06/14/22   Anson Fret, MD  BREO ELLIPTA 100-25 MCG/ACT AEPB INHALE 1 PUFF BY MOUTH EVERY DAY 08/13/22   Leslye Peer, MD  Cholecalciferol (VITAMIN D3) 2000 UNITS TABS Take 4,000 Units by mouth daily after breakfast.    [provider]  clopidogrel (PLAVIX) 75 MG tablet TAKE 1 TABLET BY MOUTH DAILY WITH BREAKFAST. 05/28/22   Corky Crafts, MD  doxazosin (CARDURA) 8 MG tablet Take 8 mg by mouth daily.    [provider]  escitalopram  (LEXAPRO) 20 MG tablet Take 10 mg by mouth in the morning. 10/26/14   [provider]  Ferrous Sulfate (IRON PO) Take 65 mg by mouth daily after breakfast. Hema Plex Iron    [provider]  gabapentin (NEURONTIN) 400 MG capsule Take 400 mg by mouth 4 (four) times daily.     [provider]  Galcanezumab-gnlm (EMGALITY) 120 MG/ML SOAJ Inject 120 mg into the skin every 30 (thirty) days. 02/22/22   Anson Fret, MD  MAGNESIUM MALATE PO Take 400 mg by mouth daily after breakfast.    [provider]  memantine (NAMENDA) 10 MG tablet TAKE 2 TABLETS BY MOUTH IN THE MORNING AND 1 TABLET IN THE LATE AFTERNOON 02/08/22   Anson Fret, MD  Multiple Vitamin (MULTIVITAMIN WITH MINERALS) TABS tablet Take 1 tablet by mouth daily after breakfast. Centrum Silver for Men    [provider]  Nerve Stimulator (NERIVIO) DEVI USE WITHIN 1 HR OF MIGRAINE. SET A STRONG YET COMFORTABLE INTENSITY LEVEL & MAINTAIN LEVEL FOR 45 MINS 06/28/22   Anson Fret, MD  nitroGLYCERIN (NITROSTAT) 0.4 MG SL tablet Place 1 tablet (0.4 mg total) under the tongue every 5 (five) minutes as needed. 03/14/21   Arty Baumgartner, NP  Omega-3 Fatty Acids (FISH OIL) 1200 MG CAPS Take 1,200 mg by mouth daily after breakfast.  [provider]  pantoprazole (PROTONIX) 20 MG tablet Take 1 tablet (20 mg total) by mouth daily. 09/06/21   Corky Crafts, MD  phenylephrine-shark liver oil-mineral oil-petrolatum (PREPARATION H) 0.25-14-74.9 % rectal ointment Place 1 application rectally 2 (two) times daily as needed for hemorrhoids.    [provider]  raNITIdine HCl (ZANTAC PO) Take by mouth every other day.    [provider]  rosuvastatin (CRESTOR) 20 MG tablet TAKE 1 TABLET BY MOUTH EVERY DAY 01/16/22   Corky Crafts, MD  silodosin (RAPAFLO) 8 MG CAPS capsule Take 8 mg by mouth daily with breakfast.    [provider]  Ubrogepant (UBRELVY) 100 MG TABS  TAKE 1 TABLET MOUTH AS NEEDED AS CLOSE TO MIGRAINE ONSET AS POSSIBLE. MAY REPEAT IN 2 HOURS IF NEEDED. MAX 200 MG PER 24 HOURS. Patient taking differently: Take 100 mg by mouth as directed. Take 1 tablet as close to migraine onset as possible. May repeat in 2 hours if needed, max dose 200 mg /daily. 06/01/21   Anson Fret, MD  vitamin B-12 (CYANOCOBALAMIN) 500 MCG tablet Take 500 mcg by mouth daily.    [provider]  warfarin (COUMADIN) 5 MG tablet TAKE 1/2 TO 1 TABLET BY MOUTH ONCE DAILY AS DIRECTED BY ANTICOAGULATION CLINIC 07/16/22   Corky Crafts, MD      Allergies    Tetracycline, Triptans, Effexor [venlafaxine hcl], Prednisone, and Sulfamethoxazole-trimethoprim    Review of Systems   Review of Systems  Constitutional:  Negative for fever.  HENT:  Positive for facial swelling.   Skin:  Positive for rash.    Physical Exam Updated Vital Signs BP 122/73   Pulse (!) 45   Temp 97.8 F (36.6 C) (Oral)   Resp 12   Ht 5\' 6"  (1.676 m)   Wt 72.6 kg   SpO2 93%   BMI 25.83 kg/m  Physical Exam Vitals and nursing note reviewed.  Constitutional:      General: He is not in acute distress.    Appearance: He is well-developed.  HENT:     Head: Normocephalic and atraumatic.     Comments: 1+ pitting edema to the T zone and mouth. No tongue swelling.  Eyes:     Conjunctiva/sclera: Conjunctivae normal.  Cardiovascular:     Rate and Rhythm: Normal rate and regular rhythm.     Heart sounds: No murmur heard. Pulmonary:     Effort: Pulmonary effort is normal. No respiratory distress.     Breath sounds: Normal breath sounds.  Abdominal:     Palpations: Abdomen is soft.     Tenderness: There is no abdominal tenderness.  Musculoskeletal:        General: No swelling.     Cervical back: Neck supple.  Skin:    General: Skin is warm and dry.     Capillary Refill: Capillary refill takes less than 2 seconds.     Comments: +petechiae to bilateral shoulders  Neurological:      Mental Status: He is alert.  Psychiatric:        Mood and Affect: Mood normal.     ED Results / Procedures / Treatments   Labs (all labs ordered are listed, but only abnormal results are displayed) Labs Reviewed  COMPREHENSIVE METABOLIC PANEL - Abnormal; Notable for the following components:      Result Value   Glucose, Bld 105 (*)    ALT 53 (*)    All other components within normal limits  BLOOD GAS, VENOUS - Abnormal; Notable for the following components:   pCO2, Ven 77 (*)    pO2, Ven <31 (*)    Bicarbonate 33.8 (*)    Acid-Base Excess 4.0 (*)    All other components within normal limits  CBC WITH DIFFERENTIAL/PLATELET    EKG None  Radiology No results found.  Procedures Procedures    Medications Ordered in ED Medications - No data to display  ED Course/ Medical Decision Making/ A&P                             Medical Decision Making Patient is a 67 year old male, here for swelling of his face, as well as petechiae of bilateral shoulders.  He has not had any new medications, foods, thus I think it is unlikely to be allergic in nature.  I discussed with my attending Dr. Adela Lank, and he recommends getting a VBG, blood work CBC, CMP for further evaluation.  Will further evaluate patient  Amount and/or Complexity of Data Reviewed Labs: ordered.    Details: Hypercarbic Discussion of management or test interpretation with external provider(s): Discussed with Dr. Adela Lank, evaluated patient, recommended BiPAP, he evaluated patient afterwards, patient is feeling better, will discharge.  Initially discussed admission, versus discharge if improving, and follow-up with primary care doctor, and patient would like to discharge.  Dr. Adela Lank wrist discussed return precautions and discharged home.   Final Clinical Impression(s) / ED Diagnoses Final diagnoses:  Hypercapnia  Edema of face    Rx / DC Orders ED Discharge Orders     None         Mckinsey Keagle, Harley [place],  PA 10/09/22 2329    Melene Plan, DO 10/10/22 1458    Melene Plan, DO 10/10/22 1501

## 2022-10-09 NOTE — ED Triage Notes (Signed)
Patient reports on Saturday he started swelling around his mouth, nose and eyes which is resolving. Pt reports itching to the shoulders. 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 Pt reports now he is hypersomnolent. Given acyclovir, topical steroid cream and antibiotic.

## 2022-10-10 ENCOUNTER — Encounter: Payer: Self-pay | Admitting: Emergency Medicine

## 2022-10-12 NOTE — Telephone Encounter (Signed)
Dr. Delton Coombes, please advise on pt email, thanks!  Hypercapnia and possible sleep apnea Received: 2 days ago Kyle Walters, Kyle "[name]"  [facility] Pool Phone Number: [phone]   I'm sorry I didn't mention this during my visit to you last Friday, but I thought my primary care physician would be setting up a sleep study for possible sleep apnea. He didn't do that before going on vacation to [place].  I was having trouble Sunday and Monday with hypersomnolence, along with an allergic response, and I went to emergency room yesterday. They did a blood test and discovered hypercapnia and hooked me up to a BPAP, which I could only tolerate because of anxiety for about half an hour, though it seemed to help with the hypersomnolence. My instructions were to contact my PCP, but he won't be getting back from [place] until the 28th.  I was wondering if you could refer me for a sleep study at [facility], since this needs to be looked at as quickly as possible. The test results are all available on MyChart.  I asked the doctor I saw if it might help if I went back to using Breo daily, at least for now, and he believed it might be helpful. I also wanted to clear that decision with you, or at least notify you of it.  I realize this out of the range of your normal duties, but my PCP didn't do what he said he was going to to do, and I don't go to the emergency room lightly. It seems like I should get this taken care of.  Thank you, Thana Ates

## 2022-10-15 ENCOUNTER — Ambulatory Visit: Payer: Medicare Other | Admitting: Adult Health

## 2022-10-15 DIAGNOSIS — G43711 Chronic migraine without aura, intractable, with status migrainosus: Secondary | ICD-10-CM

## 2022-10-15 MED ORDER — ONABOTULINUMTOXINA 200 UNITS IJ SOLR
155.0000 [IU] | Freq: Once | INTRAMUSCULAR | Status: AC
Start: 2022-10-15 — End: 2022-10-15
  Administered 2022-10-15: 155 [IU] via INTRAMUSCULAR

## 2022-10-15 NOTE — Progress Notes (Signed)
Botox- 200 units x 1 vial Lot: [id number] Expiration: 02/2025 NDC: 9811-9147-82  Bacteriostatic 0.9% Sodium Chloride- 4 mL  Lot: [id number] Expiration: 07/23/2023  NDC: 0865-7846-96  Dx: E95.284  S/P  Witnessed by Delmer Islam

## 2022-10-15 NOTE — Progress Notes (Signed)
10/15/22: Botox works well. Has gone over two weeks recently without a migraine. However today he does have a migraine. Wants to proceed with injections today.   07/04/22: Botox continues to work well for him.  He states that last month he had more headaches than typical.    1213/23: Saw dr. Lucia Gaskins in November to discussed meds. Referral to academic center was mentioned.  Reports starting Emgality has helped  01/10/2022: Had a migraine yesterday. Saw Dr. Lucia Gaskins in June to discuss Medication.   10/10/2021:  Had more headaches in may. Otherwise doing ok.   BOTOX PROCEDURE NOTE FOR MIGRAINE HEADACHE    Contraindications and precautions discussed with patient(above). Aseptic procedure was observed and patient tolerated procedure. Procedure performed by Butch Penny, NP  The condition has existed for more than 6 months, and pt does not have a diagnosis of ALS, Myasthenia Gravis or Lambert-Eaton Syndrome.  Risks and benefits of injections discussed and pt agrees to proceed with the procedure.  Written consent obtained  These injections are medically necessary.  These injections do not cause sedations or hallucinations which the oral therapies may cause.  Indication/Diagnosis: chronic migraine BOTOX(J0585) injection was performed according to protocol by [employer]. 200 units of BOTOX was dissolved into 4 cc NS.   NDC: [address]-82  Botox- 200 units x 1 vial Lot: [id number] Expiration: 02/2025 NDC: 9562-1308-65   Bacteriostatic 0.9% Sodium Chloride- 4 mL  Lot: [id number] Expiration: 07/23/2023  NDC: 2952-8413-24   Dx: M01.027     Description of procedure:  The patient was placed in a sitting position. The standard protocol was used for Botox as follows, with 5 units of Botox injected at each site:   -Procerus muscle, midline injection  -Corrugator muscle, bilateral injection  -Frontalis muscle, bilateral injection, with 2 sites each side, medial injection was performed in the upper  one third of the frontalis muscle, in the region vertical from the medial inferior edge of the superior orbital rim. The lateral injection was again in the upper one third of the forehead vertically above the lateral limbus of the cornea, 1.5 cm lateral to the medial injection site.  -Temporalis muscle injection, 4 sites, bilaterally. The first injection was 3 cm above the tragus of the ear, second injection site was 1.5 cm to 3 cm up from the first injection site in line with the tragus of the ear. The third injection site was 1.5-3 cm forward between the first 2 injection sites. The fourth injection site was 1.5 cm posterior to the second injection site.  -Occipitalis muscle injection, 3 sites, bilaterally. The first injection was done one half way between the occipital protuberance and the tip of the mastoid process behind the ear. The second injection site was done lateral and superior to the first, 1 fingerbreadth from the first injection. The third injection site was 1 fingerbreadth superiorly and medially from the first injection site.  -Cervical paraspinal muscle injection, 2 sites, bilateral knee first injection site was 1 cm from the midline of the cervical spine, 3 cm inferior to the lower border of the occipital protuberance. The second injection site was 1.5 cm superiorly and laterally to the first injection site.  -Trapezius muscle injection was performed at 3 sites, bilaterally. The first injection site was in the upper trapezius muscle halfway between the inflection point of the neck, and the acromion. The second injection site was one half way between the acromion and the first injection site. The third injection was done between the first  injection site and the inflection point of the neck.   Will return for repeat injection in 3 months.   A 200 units of Botox was used, 155 units were injected, the rest of the Botox was wasted. The patient tolerated the procedure well, there were no  complications of the above procedure.  Butch Penny, MSN, NP-C 10/15/2022, 3:09 PM [facility] [address] [place], [place] [address] [phone]

## 2022-10-16 ENCOUNTER — Telehealth: Payer: Self-pay

## 2022-10-16 NOTE — Telephone Encounter (Signed)
Transition Care Management Unsuccessful Follow-up Telephone Call  Date of discharge and from where:  10/09/2022 [facility]  Attempts:  1st Attempt  Reason for unsuccessful TCM follow-up call:  Left voice message  Marisa Hage [facility]  [facility] Population Health Community Resource Care Guide   [email or web address]  ?? [phone]   Website: [email or web address]  [email or web address]

## 2022-10-17 ENCOUNTER — Telehealth: Payer: Self-pay

## 2022-10-17 NOTE — Telephone Encounter (Signed)
Transition Care Management Unsuccessful Follow-up Telephone Call  Date of discharge and from where:  10/09/2022 [facility]  Attempts:  2nd Attempt  Reason for unsuccessful TCM follow-up call:  Left voice message  Bertram Haddix [facility]  [facility] Population Health Community Resource Care Guide   [email or web address]  ?? [phone]   Website: [email or web address]  [email or web address]

## 2022-10-18 ENCOUNTER — Encounter (HOSPITAL_BASED_OUTPATIENT_CLINIC_OR_DEPARTMENT_OTHER): Payer: Self-pay

## 2022-10-18 ENCOUNTER — Ambulatory Visit: Payer: Medicare Other | Attending: Cardiology

## 2022-10-18 DIAGNOSIS — G4733 Obstructive sleep apnea (adult) (pediatric): Secondary | ICD-10-CM

## 2022-10-18 DIAGNOSIS — Z5181 Encounter for therapeutic drug level monitoring: Secondary | ICD-10-CM | POA: Diagnosis not present

## 2022-10-18 DIAGNOSIS — I513 Intracardiac thrombosis, not elsewhere classified: Secondary | ICD-10-CM

## 2022-10-18 LAB — POCT INR: INR: 2.3 (ref 2.0–3.0)

## 2022-10-18 NOTE — Patient Instructions (Signed)
Description   DO NOT HIGHLIGHT PAPERS & GIVE PRINTED PAPERWORK TO WIFE-THESE THINGS TRIGGER MIGRAINES. Continue taking 1 tablet daily EXCEPT 1/2 tablet on Mondays, Wednesdays, Fridays, and Saturdays.  Continue eating leafy veggies daily as normal.  Recheck INR in 4 weeks.  Anticoagulation Clinic [phone]

## 2022-10-18 NOTE — Telephone Encounter (Signed)
PT calling again regarding this issue and medication, Breo. Please call to advise [phone]

## 2022-10-23 ENCOUNTER — Ambulatory Visit: Payer: Medicare Other | Admitting: Neurology

## 2022-10-23 ENCOUNTER — Encounter: Payer: Self-pay | Admitting: Adult Health

## 2022-10-23 ENCOUNTER — Ambulatory Visit: Payer: Medicare Other | Admitting: Adult Health

## 2022-10-23 VITALS — BP 100/60 | HR 64 | Temp 99.3°F | Ht 66.0 in | Wt 163.0 lb

## 2022-10-23 DIAGNOSIS — R400 Somnolence: Secondary | ICD-10-CM

## 2022-10-23 DIAGNOSIS — J449 Chronic obstructive pulmonary disease, unspecified: Secondary | ICD-10-CM

## 2022-10-23 MED ORDER — FLUTICASONE FUROATE-VILANTEROL 100-25 MCG/ACT IN AEPB: INHALATION_SPRAY | RESPIRATORY_TRACT | 6 refills | Status: DC

## 2022-10-23 MED ORDER — ALBUTEROL SULFATE HFA 108 (90 BASE) MCG/ACT IN AERS: 2.0000 | INHALATION_SPRAY | Freq: Four times a day (QID) | RESPIRATORY_TRACT | 6 refills | Status: DC | PRN

## 2022-10-23 NOTE — Assessment & Plan Note (Signed)
Daytime sleepiness, fatigue, low energy, snoring all suspicious for underlying sleep apnea.  Split-night sleep study is planned for later this month follow-up as planned patient education given on possible sleep apnea

## 2022-10-23 NOTE — Patient Instructions (Addendum)
Continue on BREO 1 puff daily, rinse after use .  Albuterol inhaler As needed   Activity as tolerated.  Use caution with sedating medications  Sleep study later this month as planned  Follow up with Dr. Delton Coombes  in 6-8 weeks with PFT and As needed   Please contact office for sooner follow up if symptoms do not improve or worsen or seek emergency care

## 2022-10-23 NOTE — Assessment & Plan Note (Signed)
Moderate COPD with emphysema and asthma-recent lab work showed elevated CO2 level.  May be elevated due to more advanced COPD.  Check PFTs on return.  Encouraged to use Breo on a daily basis.  He seems to be tolerating without significant side effects over the last 2 weeks.  Also could be secondary to underlying sleep apnea with hypoventilation.  Patient is on sedating medications with Neurontin and Xanax.  Recommended use with caution.  Check split-night study as planned later this month.  Plan  Patient Instructions  Continue on BREO 1 puff daily, rinse after use .  Albuterol inhaler As needed   Activity as tolerated.  Use caution with sedating medications  Sleep study later this month as planned  Follow up with Dr. Delton Coombes  in 6-8 weeks with PFT and As needed   Please contact office for sooner follow up if symptoms do not improve or worsen or seek emergency care

## 2022-10-23 NOTE — Addendum Note (Signed)
Addended by: Delrae Rend on: 10/23/2022 04:14 PM   Modules accepted: Orders

## 2022-10-23 NOTE — Progress Notes (Signed)
@Patient  ID: Kyle Walters, male    DOB: Sep 04, 1955, 67 y.o.   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  TEST/EVENTS :  CT chest September 24, 2022 small calcified granulomas, no suspicious appearing nodules, mild bronchial wall thickening and moderate emphysema, lung RADS 1S repeat CT chest in 12 months  2D echo August 04, 2021 apex akinetic, resolved thrombus, swirling of contrast in the apex,  EF 50 to 55%  PFTs November 10, 2014 showed moderate airflow obstruction and mild restriction  with FEV1 at 69%, ratio 65, FVC 79%, no significant bronchodilator response, DLCO 78%  10/23/2022 Follow up ; ER follow up , COPD , Hypercarbia  Patient returns for a follow-up visit.  Patient has underlying COPD with emphysema and asthma.  Patient was recently seen in the emergency room 2 weeks ago for increased daytime sleepiness and fatigue. Venous blood gas showed elevated CO2 at 77.  There was concern for possible underlying sleep apnea.  Patient has been set up for a split-night sleep study which is scheduled for later this month. Says he has noticed on his watch that his sleep is very restless. Feels tired and sleepy during daytime. Naps daily for 2 hrs. Has some snoring. Has history of migraines. Does take Neurontin 400 mg 4 times daily, Xanax 1 mg Three times a day  .  Takes Breo every other day due to side effects for last year. Since ER visit is taking every day . Feels it is helping quite a bit with breathing and even his sleep seems some better.  Active daily on  stationary bike for around .  Denies cough.  Does get short of breath with heavy activities. Walk test today in the office shows no significant desaturations on room air.  Saturations remained 93 to 94% walking on room air moderate COPD  Allergies  Allergen Reactions   Plasticized Base [Plastibase] Swelling    Swelling mouth, eye lids, bridge of nose and under eyes.  (Plastic bottles, plastic sleeve over thermometer, straws)   Tetracycline Anaphylaxis and Other (See Comments)   Triptans     Contraindicated d/t h/o MI    Effexor [Venlafaxine Hcl] Rash   Prednisone Other (See Comments)    Unable to sleep    Sulfamethoxazole-Trimethoprim Nausea Only    Immunization History  Administered Date(s) Administered   Fluad Quad(high Dose 65+) 02/19/2020   Influenza Inj Mdck Quad Pf 02/18/2019   Influenza Split 01/05/2013, 01/30/2016   Influenza,inj,Quad PF,6+ Mos 01/05/2014, 01/21/2015, 01/24/2017   Moderna SARS-COV2 Booster Vaccination 09/07/2020   PFIZER(Purple Top)SARS-COV-2 Vaccination 07/17/2019, 08/14/2019, 02/23/2020   PNEUMOCOCCAL CONJUGATE-20 09/07/2021   Tdap 10/27/2021    Past Medical History:  Diagnosis Date   ADHD (attention deficit hyperactivity disorder)    Allergy    Anemia    Anxiety panic attack   Arthritis    COPD (chronic obstructive pulmonary disease) (HCC)    bronchitis/emphysema   Coronary artery disease    Depression    Emphysema lung (HCC)    Emphysema of lung (HCC)    Enlarged prostate    Fibromyalgia    GERD (gastroesophageal reflux disease)  Hyperlipidemia    Hypertension    IBS (irritable bowel syndrome)    Migraine    Myocardial infarction (HCC)    Panic attack     Tobacco History: Social History   Tobacco Use  Smoking Status Former   Packs/day: 1.50   Years: 40.00   Additional pack years: 0.00   Total pack years: 60.00   Types: Cigarettes   Quit date: 05/17/2013   Years since quitting: 9.4  Smokeless Tobacco Never    Counseling given: Not Answered   Outpatient Medications Prior to Visit  Medication Sig Dispense Refill   albuterol (VENTOLIN HFA) 108 (90 Base) MCG/ACT inhaler Inhale 2 puffs into the lungs every 6 (six) hours as needed for wheezing or shortness of breath. 8 g 2   ALPRAZolam (XANAX) 1 MG tablet Take 1 mg by mouth 4 (four) times daily.      Atogepant (QULIPTA) 60 MG TABS Take by mouth daily.     B Complex-Biotin-FA (B-50 COMPLEX PO) Take 1 capsule by mouth daily.     botulinum toxin Type A (BOTOX) 200 units injection PROVIDER TO INJECT 155 UNITS INTO MUSCLES OF HEAD AND NECK EVERY 3 MONTHS. DISCARD REMAINDER. 1 each 3   BREO ELLIPTA 100-25 MCG/ACT AEPB INHALE 1 PUFF BY MOUTH EVERY DAY 60 each 2   Cholecalciferol (VITAMIN D3) 2000 UNITS TABS Take 4,000 Units by mouth daily after breakfast.     clopidogrel (PLAVIX) 75 MG tablet TAKE 1 TABLET BY MOUTH DAILY WITH BREAKFAST. 90 tablet 3   doxazosin (CARDURA) 8 MG tablet Take 8 mg by mouth daily.     escitalopram (LEXAPRO) 20 MG tablet Take 10 mg by mouth in the morning.  2   Ferrous Sulfate (IRON PO) Take 65 mg by mouth daily after breakfast. Hema Plex Iron     gabapentin (NEURONTIN) 400 MG capsule Take 400 mg by mouth 4 (four) times daily.      Galcanezumab-gnlm (EMGALITY) 120 MG/ML SOAJ Inject 120 mg into the skin every 30 (thirty) days. 1.12 mL 11   MAGNESIUM MALATE PO Take 400 mg by mouth daily after breakfast.     memantine (NAMENDA) 10 MG tablet TAKE 2 TABLETS BY MOUTH IN THE MORNING AND 1 TABLET IN THE LATE AFTERNOON 90 tablet 0   Multiple Vitamin (MULTIVITAMIN WITH MINERALS) TABS tablet Take 1 tablet by mouth daily after breakfast. Centrum Silver for Men     Nerve Stimulator (NERIVIO) DEVI USE WITHIN 1 HR OF MIGRAINE. SET A STRONG YET COMFORTABLE INTENSITY LEVEL & MAINTAIN LEVEL FOR 45 MINS 1 each 0   nitroGLYCERIN (NITROSTAT) 0.4 MG SL tablet Place 1 tablet (0.4 mg total) under the tongue every 5 (five) minutes as needed. 25 tablet 2    Omega-3 Fatty Acids (FISH OIL) 1200 MG CAPS Take 1,200 mg by mouth daily after breakfast.     phenylephrine-shark liver oil-mineral oil-petrolatum (PREPARATION H) 0.25-14-74.9 % rectal ointment Place 1 application rectally 2 (two) times daily as needed for hemorrhoids.     rosuvastatin (CRESTOR) 20 MG tablet TAKE 1 TABLET BY MOUTH EVERY DAY 90 tablet 3   silodosin (RAPAFLO) 8 MG CAPS capsule Take 8 mg by mouth daily with breakfast.     Ubrogepant (UBRELVY) 100 MG TABS TAKE 1 TABLET MOUTH AS NEEDED AS CLOSE TO MIGRAINE ONSET AS POSSIBLE. MAY REPEAT IN 2 HOURS IF NEEDED. MAX 200 MG PER 24 HOURS. (Patient taking differently: Take 100 mg by mouth as directed. Take 1 tablet as close to migraine  onset as possible. May repeat in 2 hours if needed, max dose 200 mg /daily.) 16 tablet 11   vitamin B-12 (CYANOCOBALAMIN) 500 MCG tablet Take 500 mcg by mouth daily.     warfarin (COUMADIN) 5 MG tablet TAKE 1/2 TO 1 TABLET BY MOUTH ONCE DAILY AS DIRECTED BY ANTICOAGULATION CLINIC 100 tablet 1   acetaminophen (TYLENOL) 500 MG tablet Take 1,000 mg by mouth every 6 (six) hours as needed for mild pain.     pantoprazole (PROTONIX) 20 MG tablet Take 1 tablet (20 mg total) by mouth daily. 90 tablet 1   raNITIdine HCl (ZANTAC PO) Take by mouth every other day.     No facility-administered medications prior to visit.     Review of Systems:   Constitutional:   No  weight loss, night sweats,  Fevers, chills,  +fatigue, or  lassitude.  HEENT:   No headaches,  Difficulty swallowing,  Tooth/dental problems, or  Sore throat,                No sneezing, itching, ear ache, nasal congestion, post nasal drip,   CV:  No chest pain,  Orthopnea, PND, swelling in lower extremities, anasarca, dizziness, palpitations, syncope.   GI  No heartburn, indigestion, abdominal pain, nausea, vomiting, diarrhea, change in bowel habits, loss of appetite, bloody stools.   Resp: .  No chest wall deformity  Skin: no rash or lesions.  GU:  no dysuria, change in color of urine, no urgency or frequency.  No flank pain, no hematuria   MS:  No joint pain or swelling.  No decreased range of motion.  No back pain.    Physical Exam  BP 100/60 (BP Location: Left Arm, Patient Position: Sitting, Cuff Size: Normal)   Pulse 64   Temp 99.3 F (37.4 C) (Temporal)   Ht 5\' 6"  (1.676 m)   Wt 163 lb (73.9 kg)   SpO2 94%   BMI 26.31 kg/m   GEN: A/Ox3; pleasant , NAD, well nourished    HEENT:  [place]/AT,  EACs-clear, TMs-wnl, NOSE-clear, THROAT-clear, no lesions, no postnasal drip or exudate noted.   NECK:  Supple w/ fair ROM; no JVD; normal carotid impulses w/o bruits; no thyromegaly or nodules palpated; no lymphadenopathy.    RESP  Clear  P & A; w/o, wheezes/ rales/ or rhonchi. no accessory muscle use, no dullness to percussion  CARD:  RRR, no m/r/g, no peripheral edema, pulses intact, no cyanosis or clubbing.  GI:   Soft & nt; nml bowel sounds; no organomegaly or masses detected.   Musco: Warm bil, no deformities or joint swelling noted.   Neuro: alert, no focal deficits noted.    Skin: Warm, no lesions or rashes    Lab Results:  CBC    Component Value Date/Time   WBC 6.5 10/09/2022 1750   RBC 4.39 10/09/2022 1750   HGB 13.9 10/09/2022 1750   HGB 14.4 03/07/2021 1442   HCT 42.7 10/09/2022 1750   HCT 42.5 03/07/2021 1442   PLT 183 10/09/2022 1750   PLT 245 03/07/2021 1442   MCV 97.3 10/09/2022 1750   MCV 92 03/07/2021 1442   MCH 31.7 10/09/2022 1750   MCHC 32.6 10/09/2022 1750   RDW 13.2 10/09/2022 1750   RDW 13.1 03/07/2021 1442   LYMPHSABS 2.0 10/09/2022 1750   MONOABS 0.7 10/09/2022 1750   EOSABS 0.2 10/09/2022 1750   BASOSABS 0.0 10/09/2022 1750    BMET    Component Value Date/Time   NA  141 10/09/2022 1750   NA 140 05/19/2021 1015   K 4.4 10/09/2022 1750   CL 108 10/09/2022 1750   CO2 27 10/09/2022 1750   GLUCOSE 105 (H) 10/09/2022 1750   BUN 18 10/09/2022 1750   BUN 14 05/19/2021 1015    CREATININE 0.94 10/09/2022 1750   CREATININE 1.16 12/31/2017 1327   CREATININE 1.19 11/15/2017 0958   CALCIUM 8.9 10/09/2022 1750   GFRNONAA >60 10/09/2022 1750   GFRNONAA >60 12/31/2017 1327   GFRNONAA 65 11/15/2017 0958   GFRAA >60 12/31/2017 1327   GFRAA 75 11/15/2017 0958    BNP No results found for: "BNP"  ProBNP    Component Value Date/Time   PROBNP 46.0 09/14/2014 1727    Imaging: CT CHEST LUNG CA SCREEN LOW DOSE W/O CM  Result Date: 10/03/2022 CLINICAL DATA:  67 year old male former smoker (quit in 2016) with 36 pack-year history of smoking. Lung cancer screening examination. EXAM: CT CHEST WITHOUT CONTRAST LOW-DOSE FOR LUNG CANCER SCREENING TECHNIQUE: Multidetector CT imaging of the chest was performed following the standard protocol without IV contrast. RADIATION DOSE REDUCTION: This exam was performed according to the departmental dose-optimization program which includes automated exposure control, adjustment of the mA and/or kV according to patient size and/or use of iterative reconstruction technique. COMPARISON:  Low-dose lung cancer screening chest CT 09/26/2021. FINDINGS: Cardiovascular: Heart size is normal. There is no significant pericardial fluid, thickening or pericardial calcification. There is aortic atherosclerosis, as well as atherosclerosis of the great vessels of the mediastinum and the coronary arteries, including calcified atherosclerotic plaque in the left main, left anterior descending, left circumflex and right coronary arteries. Mild aneurysmal bulging of the left ventricular apex which is partially calcified best appreciated on axial image 40 of series 3, likely reflective of remodeling and scarring in the setting of prior distal LAD territory myocardial infarction(s). Mediastinum/Nodes: No pathologically enlarged mediastinal or hilar lymph nodes. Please note that accurate exclusion of hilar adenopathy is limited on noncontrast CT scans. Esophagus is  unremarkable in appearance. No axillary lymphadenopathy. Lungs/Pleura: Small calcified granulomas are noted. No suspicious appearing pulmonary nodules or masses are noted. No acute consolidative airspace disease. No pleural effusions. Mild diffuse bronchial wall thickening with moderate centrilobular and paraseptal emphysema. Upper Abdomen: Aortic atherosclerosis. Musculoskeletal: There are no aggressive appearing lytic or blastic lesions noted in the visualized portions of the skeleton. IMPRESSION: 1. Lung-RADS 1S, negative. Continue annual screening with low-dose chest CT without contrast in 12 months. 2. The "S" modifier above refers to potentially clinically significant non lung cancer related findings. Specifically, there is aortic atherosclerosis, in addition to left main and three-vessel coronary artery disease. There is also evidence of prior distal areas LAD territory myocardial infarction with mild aneurysmal dilatation in the left ventricular apex, as above. Given the presence of calcification in this region, the possibility of left ventricular apical thrombus should be considered, which could place the patient at risk for systemic embolization. Correlation with echocardiography may be warranted in the appropriate clinical setting. 3. Mild diffuse bronchial wall thickening with moderate centrilobular and paraseptal emphysema; imaging findings suggestive of underlying COPD. Aortic Atherosclerosis (ICD10-I70.0) and Emphysema (ICD10-J43.9). Electronically Signed   By: Trudie Reed M.D.   On: 10/03/2022 11:39    botulinum toxin Type A (BOTOX) injection 155 Units     Date Action Dose Route User   10/15/2022 1531 Given 155 Units Intramuscular (Other) Butch Penny, NP          Latest Ref Rng & Units  11/10/2014   12:58 PM 03/09/2013    3:00 PM  PFT Results  FVC-Pre L 3.29    FVC-Predicted Pre % 79  64   FVC-Post L 3.15  3.18   FVC-Predicted Post % 76  76   Pre FEV1/FVC % % 65  53   Post  FEV1/FCV % % 65  59   FEV1-Pre L 2.15  1.43   FEV1-Predicted Pre % 69  44   FEV1-Post L 2.04  1.88   DLCO uncorrected ml/min/mmHg 21.25  20.68   DLCO UNC% % 78  76   DLVA Predicted % 87  87   TLC L  6.08   TLC % Predicted %  98   RV % Predicted %  167     No results found for: "NITRICOXIDE"      Assessment & Plan:   COPD (chronic obstructive pulmonary disease) Moderate COPD with emphysema and asthma-recent lab work showed elevated CO2 level.  May be elevated due to more advanced COPD.  Check PFTs on return.  Encouraged to use Breo on a daily basis.  He seems to be tolerating without significant side effects over the last 2 weeks.  Also could be secondary to underlying sleep apnea with hypoventilation.  Patient is on sedating medications with Neurontin and Xanax.  Recommended use with caution.  Check split-night study as planned later this month.  Plan  Patient Instructions  Continue on BREO 1 puff daily, rinse after use .  Albuterol inhaler As needed   Activity as tolerated.  Use caution with sedating medications  Sleep study later this month as planned  Follow up with Dr. Delton Coombes  in 6-8 weeks with PFT and As needed   Please contact office for sooner follow up if symptoms do not improve or worsen or seek emergency care       Daytime sleepiness Daytime sleepiness, fatigue, low energy, snoring all suspicious for underlying sleep apnea.  Split-night sleep study is planned for later this month follow-up as planned patient education given on possible sleep apnea     Rubye Oaks, NP 10/23/2022

## 2022-10-31 ENCOUNTER — Telehealth: Payer: Self-pay | Admitting: Adult Health

## 2022-10-31 NOTE — Telephone Encounter (Signed)
LVM and sent mychart msg informing pt of appt change due to NP being out 

## 2022-11-05 ENCOUNTER — Other Ambulatory Visit: Payer: Self-pay | Admitting: Neurology

## 2022-11-06 ENCOUNTER — Other Ambulatory Visit: Payer: Self-pay | Admitting: Neurology

## 2022-11-06 MED ORDER — MEMANTINE HCL 10 MG PO TABS: ORAL_TABLET | ORAL | 0 refills | Status: AC

## 2022-11-08 ENCOUNTER — Encounter: Payer: Self-pay | Admitting: *Deleted

## 2022-11-08 NOTE — Telephone Encounter (Signed)
Sent pt a message about using good rx

## 2022-11-11 ENCOUNTER — Emergency Department (HOSPITAL_COMMUNITY): Payer: Medicare Other

## 2022-11-11 ENCOUNTER — Emergency Department (HOSPITAL_COMMUNITY)
Admission: EM | Admit: 2022-11-11 | Discharge: 2022-11-12 | Disposition: A | Discharge status: Home / Self Care | Payer: Medicare Other | Attending: Emergency Medicine | Admitting: Emergency Medicine

## 2022-11-11 ENCOUNTER — Other Ambulatory Visit: Payer: Self-pay

## 2022-11-11 ENCOUNTER — Encounter (HOSPITAL_COMMUNITY): Payer: Self-pay

## 2022-11-11 DIAGNOSIS — R1013 Epigastric pain: Secondary | ICD-10-CM | POA: Insufficient documentation

## 2022-11-11 DIAGNOSIS — J449 Chronic obstructive pulmonary disease, unspecified: Secondary | ICD-10-CM | POA: Insufficient documentation

## 2022-11-11 DIAGNOSIS — Z87891 Personal history of nicotine dependence: Secondary | ICD-10-CM | POA: Insufficient documentation

## 2022-11-11 DIAGNOSIS — I2510 Atherosclerotic heart disease of native coronary artery without angina pectoris: Secondary | ICD-10-CM | POA: Insufficient documentation

## 2022-11-11 DIAGNOSIS — R079 Chest pain, unspecified: Secondary | ICD-10-CM | POA: Diagnosis not present

## 2022-11-11 DIAGNOSIS — I10 Essential (primary) hypertension: Secondary | ICD-10-CM | POA: Insufficient documentation

## 2022-11-11 MED ORDER — ALUM & MAG HYDROXIDE-SIMETH 200-200-20 MG/5ML PO SUSP
30.0000 mL | Freq: Once | ORAL | Status: AC
  Administered 2022-11-12: 30 mL via ORAL
  Filled 2022-11-11: qty 30

## 2022-11-11 NOTE — ED Provider Notes (Signed)
[facility]-EMERGENCY DEPT [facility] Emergency Department Provider Note MRN:  [id number]  Arrival date & time: 11/12/22     Chief Complaint   Chest Pain   History of Present Illness   Kyle Walters is a 67 y.o. year-old male with a history of COPD, CAD presenting to the ED with chief complaint of chest pain.  Lower chest/epigastric pain at 6 PM today, constant since that time.  Started initially when he laid down on his left-sided then noticed some pain.  Having some mild shortness of breath with the pain as well as increased flatulence.  No upper chest pain, no dizziness or diaphoresis, no nausea vomiting or diarrhea.  No recent fever or cough.  Review of Systems  A thorough review of systems was obtained and all systems are negative except as noted in the HPI and PMH.   Patient's Health History    Past Medical History:  Diagnosis Date   ADHD (attention deficit hyperactivity disorder)    Allergy    Anemia    Anxiety panic attack   Arthritis    COPD (chronic obstructive pulmonary disease) (HCC)    bronchitis/emphysema   Coronary artery disease    Depression    Emphysema lung (HCC)    Emphysema of lung (HCC)    Enlarged prostate    Fibromyalgia    GERD (gastroesophageal reflux disease)    Hyperlipidemia    Hypertension    IBS (irritable bowel syndrome)    Migraine    Myocardial infarction (HCC)    Panic attack     Past Surgical History:  Procedure Laterality Date   CARDIAC CATHETERIZATION     CORONARY STENT INTERVENTION N/A 03/14/2021   Procedure: CORONARY STENT INTERVENTION;  Surgeon: Corky Crafts, MD;  Location: [facility] INVASIVE CV LAB;  Service: Cardiovascular;  Laterality: N/A;   LEFT HEART CATH AND CORONARY ANGIOGRAPHY N/A 03/14/2021   Procedure: LEFT HEART CATH AND CORONARY ANGIOGRAPHY;  Surgeon: Corky Crafts, MD;  Location: [facility] INVASIVE CV LAB;  Service: Cardiovascular;  Laterality: N/A;   NO PAST SURGERIES      Family History  Problem Relation  Age of Onset   Heart disease Mother    Breast cancer Mother    Diabetes Mother    Osteoarthritis Mother    Stroke Mother    Other Father        stomach ulcer   Heart disease Father    Bladder Cancer Father    Diabetes Father    Stroke Father    Hypertension Father    Pancreatic cancer Father    Migraines Father    Irritable bowel syndrome Father    Hypertension Brother    Other Brother        stomach ulcer   Colon cancer Neg Hx    Rectal cancer Neg Hx    Esophageal cancer Neg Hx    Stomach cancer Neg Hx     Social History   Socioeconomic History   Marital status: Married    Spouse name: Marisue Ivan   Number of children: 1   Years of education: 18   Highest education level: Master's degree (e.g., MA, MS, MEng, MEd, MSW, MBA)  Occupational History   Occupation: disabled  Tobacco Use   Smoking status: Former    Current packs/day: 0.00    Average packs/day: 1.5 packs/day for 40.0 years (60.0 ttl pk-yrs)    Types: Cigarettes    Start date: 05/17/1973    Quit date: 05/17/2013    Years  since quitting: 9.4   Smokeless tobacco: Never  Vaping Use   Vaping status: Former  Substance and Sexual Activity   Alcohol use: No    Alcohol/week: 0.0 standard drinks of alcohol   Drug use: No   Sexual activity: Not on file  Other Topics Concern   Not on file  Social History Narrative   Lives at home with wife and stepson    Right handed   Caffeine: 8 oz coke per day    Social Determinants of Health   Financial Resource Strain: Not on file  Food Insecurity: Not on file  Transportation Needs: Not on file  Physical Activity: Not on file  Stress: Not on file  Social Connections: Unknown (10/27/2021)   Received from [facility]   Social Network    Social Network: Not on file  Intimate Partner Violence: Unknown (10/27/2021)   Received from [employer], [facility]   HITS    Physically Hurt: Not on file    Insult or Talk Down To: Not on file    Threaten Physical  Harm: Not on file    Scream or Curse: Not on file     Physical Exam   Vitals:   11/11/22 2247  BP: (!) 146/71  Pulse: (!) 58  Resp: 18  Temp: 98 F (36.7 C)  SpO2: 97%    CONSTITUTIONAL: Well-appearing, NAD NEURO/PSYCH:  Alert and oriented x 3, no focal deficits EYES:  eyes equal and reactive ENT/NECK:  no LAD, no JVD CARDIO: Regular rate, well-perfused, normal S1 and S2 PULM:  CTAB no wheezing or rhonchi GI/GU:  non-distended, non-tender MSK/SPINE:  No gross deformities, no edema SKIN:  no rash, atraumatic   *Additional and/or pertinent findings included in MDM below  Diagnostic and Interventional Summary    EKG Interpretation Date/Time:  Sunday November 11 2022 22:47:26 EDT Ventricular Rate:  60 PR Interval:  144 QRS Duration:  100 QT Interval:  440 QTC Calculation: 440 R Axis:   99  Text Interpretation: Sinus rhythm Right axis deviation Probable anteroseptal infarct, old Confirmed by Kennis Carina [phone]) on 11/11/2022 11:34:57 PM       Labs Reviewed  BASIC METABOLIC PANEL - Abnormal; Notable for the following components:      Result Value   Calcium 8.7 (*)    All other components within normal limits  PROTIME-INR - Abnormal; Notable for the following components:   Prothrombin Time 24.3 (*)    INR 2.2 (*)    All other components within normal limits  CBC  LIPASE, BLOOD  HEPATIC FUNCTION PANEL  TROPONIN I (HIGH SENSITIVITY)  TROPONIN I (HIGH SENSITIVITY)    DG Chest Port 1 View  Final Result      Medications  alum & mag hydroxide-simeth (MAALOX/MYLANTA) 200-200-20 MG/5ML suspension 30 mL (30 mLs Oral Given 11/12/22 0017)     Procedures  /  Critical Care Procedures  ED Course and Medical Decision Making  Initial Impression and Ddx Very well-appearing with normal vital signs, no increased work of breathing, abdomen is soft and nontender with no rebound guarding or rigidity.  Possibly MSK or GERD related pain however atypical presentation of ACS is  considered.  Patient has a documented history of CAD with 2 stents placed back in 2022.  EKG is unchanged from prior, awaiting troponins.  Highly doubt PE.  Past medical/surgical history that increases complexity of ED encounter: CAD  Interpretation of Diagnostics I personally reviewed the EKG and my interpretation is as  follows: Sinus rhythm without concerning ischemic findings, largely unchanged from prior  Labs reassuring with no significant blood count or electrolyte disturbance, normal liver, kidney, pancreatic evaluation, troponin negative x 2.  Patient Reassessment and Ultimate Disposition/Management     Patient feeling better after Maalox, heart score 4 and generally an atypical presentation.  Patient comfortable with outpatient follow-up with cardiology.  Patient management required discussion with the following services or consulting groups:  None  Complexity of Problems Addressed Acute illness or injury that poses threat of life of bodily function  Additional Data Reviewed and Analyzed Further history obtained from: Further history from spouse/family member  Additional Factors Impacting ED Encounter Risk Prescriptions  Elmer Sow. Pilar Plate, MD [facility] Emergency Medicine [facility] [email or web address]  Final Clinical Impressions(s) / ED Diagnoses     ICD-10-CM   1. Epigastric pain  R10.13       ED Discharge Orders          Ordered    sucralfate (CARAFATE) 1 g tablet  4 times daily PRN        11/12/22 0159             Discharge Instructions Discussed with and Provided to Patient:     Discharge Instructions      You were evaluated in the Emergency Department and after careful evaluation, we did not find any emergent condition requiring admission or further testing in the hospital.  Your exam/testing today is overall reassuring.  No signs of heart damage or any other emergencies.  Symptoms may be due to indigestion or acid related pain.   Can use the Carafate as needed for discomfort.  Recommend follow-up with your cardiologist to discuss your symptoms.  Please return to the Emergency Department if you experience any worsening of your condition.   Thank you for allowing [place] to be a part of your care.       Sabas Sous, MD 11/12/22 [phone]

## 2022-11-11 NOTE — ED Triage Notes (Signed)
Pt states that he was lying down at 1800 for a nap when he had sudden onset of midsternal chest/epigastric pain that feels like a bruise.

## 2022-11-12 ENCOUNTER — Telehealth: Payer: Self-pay

## 2022-11-12 LAB — CBC
HCT: 41.9 % (ref 39.0–52.0)
Hemoglobin: 14 g/dL (ref 13.0–17.0)
MCH: 31 pg (ref 26.0–34.0)
MCHC: 33.4 g/dL (ref 30.0–36.0)
MCV: 92.9 fL (ref 80.0–100.0)
Platelets: 184 10*3/uL (ref 150–400)
RBC: 4.51 MIL/uL (ref 4.22–5.81)
RDW: 13.1 % (ref 11.5–15.5)
WBC: 6.4 10*3/uL (ref 4.0–10.5)
nRBC: 0 % (ref 0.0–0.2)

## 2022-11-12 LAB — HEPATIC FUNCTION PANEL
ALT: 37 U/L (ref 0–44)
AST: 36 U/L (ref 15–41)
Albumin: 3.9 g/dL (ref 3.5–5.0)
Alkaline Phosphatase: 87 U/L (ref 38–126)
Bilirubin, Direct: 0.1 mg/dL (ref 0.0–0.2)
Indirect Bilirubin: 0.7 mg/dL (ref 0.3–0.9)
Total Bilirubin: 0.8 mg/dL (ref 0.3–1.2)
Total Protein: 7.3 g/dL (ref 6.5–8.1)

## 2022-11-12 LAB — PROTIME-INR
INR: 2.2 — ABNORMAL HIGH (ref 0.8–1.2)
Prothrombin Time: 24.3 seconds — ABNORMAL HIGH (ref 11.4–15.2)

## 2022-11-12 LAB — BASIC METABOLIC PANEL
Anion gap: 9 (ref 5–15)
BUN: 16 mg/dL (ref 8–23)
CO2: 28 mmol/L (ref 22–32)
Calcium: 8.7 mg/dL — ABNORMAL LOW (ref 8.9–10.3)
Chloride: 98 mmol/L (ref 98–111)
Creatinine, Ser: 0.86 mg/dL (ref 0.61–1.24)
GFR, Estimated: >60 mL/min (ref >60)
Glucose, Bld: 89 mg/dL (ref 70–99)
Potassium: 3.8 mmol/L (ref 3.5–5.1)
Sodium: 135 mmol/L (ref 135–145)

## 2022-11-12 LAB — LIPASE, BLOOD: Lipase: 29 U/L (ref 11–51)

## 2022-11-12 LAB — TROPONIN I (HIGH SENSITIVITY)
Troponin I (High Sensitivity): 10 ng/L (ref <18)
Troponin I (High Sensitivity): 11 ng/L (ref <18)

## 2022-11-12 MED ORDER — SUCRALFATE 1 G PO TABS: 1.0000 g | ORAL_TABLET | Freq: Four times a day (QID) | ORAL | 0 refills | Status: DC | PRN

## 2022-11-12 NOTE — Telephone Encounter (Signed)
Transition Care Management Follow-up Telephone Call Date of discharge and from where: 11/12/2022 [facility] How have you been since you were released from the hospital? Patient is feeling better but still has some GI upset. Any questions or concerns? No  Items Reviewed: Did the pt receive and understand the discharge instructions provided? Yes  Medications obtained and verified?  Patient stated he will pickup his medication later today. Other? No  Any new allergies since your discharge? No  Dietary orders reviewed? Yes Do you have support at home? Yes   Follow up appointments reviewed:  PCP Hospital f/u appt confirmed? No  Scheduled to see  on  @ . Specialist Hospital f/u appt confirmed? No  Scheduled to see  on  @ . Are transportation arrangements needed? No  If their condition worsens, is the pt aware to call PCP or go to the Emergency Dept.? Yes Was the patient provided with contact information for the PCP's office or ED? Yes Was to pt encouraged to call back with questions or concerns? Yes  Lakaisha Danish [facility]  [facility] Population Health Community Resource Care Guide   [email or web address]  ?? [phone]   Website: [email or web address]  [email or web address]

## 2022-11-12 NOTE — Discharge Instructions (Signed)
You were evaluated in the Emergency Department and after careful evaluation, we did not find any emergent condition requiring admission or further testing in the hospital.  Your exam/testing today is overall reassuring.  No signs of heart damage or any other emergencies.  Symptoms may be due to indigestion or acid related pain.  Can use the Carafate as needed for discomfort.  Recommend follow-up with your cardiologist to discuss your symptoms.  Please return to the Emergency Department if you experience any worsening of your condition.   Thank you for allowing [place] to be a part of your care.

## 2022-11-13 ENCOUNTER — Telehealth: Payer: Self-pay | Admitting: Interventional Cardiology

## 2022-11-13 ENCOUNTER — Ambulatory Visit (INDEPENDENT_AMBULATORY_CARE_PROVIDER_SITE_OTHER): Payer: Medicare Other | Admitting: Cardiovascular Disease

## 2022-11-13 DIAGNOSIS — I513 Intracardiac thrombosis, not elsewhere classified: Secondary | ICD-10-CM | POA: Diagnosis not present

## 2022-11-13 DIAGNOSIS — Z5181 Encounter for therapeutic drug level monitoring: Secondary | ICD-10-CM | POA: Diagnosis not present

## 2022-11-13 NOTE — Telephone Encounter (Signed)
Please refer to Anticoagulation Encounter from today regarding the call & details. Thanks.

## 2022-11-13 NOTE — Telephone Encounter (Signed)
Pt returning nurse's call. Please advise

## 2022-11-13 NOTE — Telephone Encounter (Signed)
Patient was in the ER on 7/21 and had his INR check. calling to see if he still need his appt on 7/25. Please advise

## 2022-11-14 ENCOUNTER — Ambulatory Visit (HOSPITAL_BASED_OUTPATIENT_CLINIC_OR_DEPARTMENT_OTHER): Payer: Medicare Other | Attending: Internal Medicine | Admitting: Internal Medicine

## 2022-11-14 VITALS — Ht 66.5 in | Wt 155.0 lb

## 2022-11-14 DIAGNOSIS — I10 Essential (primary) hypertension: Secondary | ICD-10-CM | POA: Diagnosis not present

## 2022-11-14 DIAGNOSIS — J449 Chronic obstructive pulmonary disease, unspecified: Secondary | ICD-10-CM | POA: Diagnosis not present

## 2022-11-14 DIAGNOSIS — R5383 Other fatigue: Secondary | ICD-10-CM | POA: Insufficient documentation

## 2022-11-14 DIAGNOSIS — G4733 Obstructive sleep apnea (adult) (pediatric): Secondary | ICD-10-CM | POA: Diagnosis not present

## 2022-11-15 ENCOUNTER — Ambulatory Visit: Payer: Medicare Other

## 2022-11-17 DIAGNOSIS — G4733 Obstructive sleep apnea (adult) (pediatric): Secondary | ICD-10-CM

## 2022-11-17 NOTE — Procedures (Signed)
   Patient Name: Kyle Walters, Kyle Walters Date: 11/14/2022 Gender: Male D.O.B: July 20, 1955 Age (years): 67 Referring Provider: Rometta Emery Height (inches): 67 Interpreting Physician: Jetty Duhamel MD, ABSM Weight (lbs): 155 RPSGT: Lowry Ram BMI: 25 MRN: [id number] Neck Size: 14.50  CLINICAL INFORMATION Sleep Study Type: NPSG Indication for sleep study: COPD, Daytime Fatigue, Depression, Hypertension, Morning Headaches, Snoring Epworth Sleepiness Score: 2  SLEEP STUDY TECHNIQUE As per the AASM Manual for the Scoring of Sleep and Associated Events v2.3 (April 2016) with a hypopnea requiring 4% desaturations.  The channels recorded and monitored were frontal, central and occipital EEG, electrooculogram (EOG), submentalis EMG (chin), nasal and oral airflow, thoracic and abdominal wall motion, anterior tibialis EMG, snore microphone, electrocardiogram, and pulse oximetry.  MEDICATIONS Medications self-administered by patient taken the night of the study : bedtime medication reported but not identified.  SLEEP ARCHITECTURE The study was initiated at 10:51:03 PM and ended at 5:04:55 AM.  Sleep onset time was 39.5 minutes and the sleep efficiency was 69.4%. The total sleep time was 259.3 minutes.  Stage REM latency was 133.5 minutes.  The patient spent 1.5% of the night in stage N1 sleep, 86.7% in stage N2 sleep, 0.0% in stage N3 and 11.8% in REM.  Alpha intrusion was absent.  Supine sleep was 0.00%.  RESPIRATORY PARAMETERS The overall apnea/hypopnea index (AHI) was 0.0 per hour. There were 0 total apneas, including 0 obstructive, 0 central and 0 mixed apneas. There were 0 hypopneas and 0 RERAs.  The AHI during Stage REM sleep was 0.0 per hour.  AHI while supine was N/A per hour.  The mean oxygen saturation was 94.7%. The minimum SpO2 during sleep was 91.0%.  soft snoring was noted during this study.  CARDIAC DATA The 2 lead EKG demonstrated sinus rhythm. The mean  heart rate was 42.8 beats per minute. Other EKG findings include: PVCs.  LEG MOVEMENT DATA The total PLMS were 0 with a resulting PLMS index of 0.0. Associated arousal with leg movement index was 1.4 .  IMPRESSIONS - Patient wore oral appliance "mouthguard". - No significant obstructive sleep apnea occurred during this study (AHI = 0.0/h). - The patient had minimal or no oxygen desaturation during the study (Min O2 = 91.0%) - The patient snored with soft snoring volume. - EKG findings include PVCs. - Clinically significant periodic limb movements did not occur during sleep. No significant associated arousals.  DIAGNOSIS - Normal Study  RECOMMENDATIONS - Manage for symptoms based on clinical judgment. - Sleep hygiene should be reviewed to assess factors that may improve sleep quality. - Weight management and regular exercise should be initiated or continued if appropriate.  [Electronically signed] 11/17/2022 12:19 PM  Jetty Duhamel MD, ABSM Diplomate, American Board of Sleep Medicine NPI: [id number]                          Jetty Duhamel Diplomate, American Board of Sleep Medicine  ELECTRONICALLY SIGNED ON:  11/17/2022, 12:15 PM [facility] PH: [phone]   FX: [phone] ACCREDITED BY THE AMERICAN ACADEMY OF SLEEP MEDICINE

## 2022-12-04 DIAGNOSIS — R1084 Generalized abdominal pain: Secondary | ICD-10-CM | POA: Diagnosis not present

## 2022-12-10 ENCOUNTER — Emergency Department (HOSPITAL_COMMUNITY)
Admission: EM | Admit: 2022-12-10 | Discharge: 2022-12-10 | Discharge status: Against Medical Advice | Payer: Medicare Other | Attending: Emergency Medicine | Admitting: Emergency Medicine

## 2022-12-10 ENCOUNTER — Other Ambulatory Visit: Payer: Self-pay

## 2022-12-10 ENCOUNTER — Encounter (HOSPITAL_COMMUNITY): Payer: Self-pay

## 2022-12-10 ENCOUNTER — Emergency Department (HOSPITAL_COMMUNITY): Payer: Medicare Other

## 2022-12-10 DIAGNOSIS — N281 Cyst of kidney, acquired: Secondary | ICD-10-CM | POA: Diagnosis not present

## 2022-12-10 DIAGNOSIS — J439 Emphysema, unspecified: Secondary | ICD-10-CM | POA: Diagnosis not present

## 2022-12-10 DIAGNOSIS — R109 Unspecified abdominal pain: Secondary | ICD-10-CM | POA: Diagnosis not present

## 2022-12-10 DIAGNOSIS — R079 Chest pain, unspecified: Secondary | ICD-10-CM | POA: Diagnosis not present

## 2022-12-10 DIAGNOSIS — R110 Nausea: Secondary | ICD-10-CM | POA: Insufficient documentation

## 2022-12-10 DIAGNOSIS — I77811 Abdominal aortic ectasia: Secondary | ICD-10-CM | POA: Diagnosis not present

## 2022-12-10 DIAGNOSIS — I2510 Atherosclerotic heart disease of native coronary artery without angina pectoris: Secondary | ICD-10-CM | POA: Diagnosis not present

## 2022-12-10 DIAGNOSIS — Z5321 Procedure and treatment not carried out due to patient leaving prior to being seen by health care provider: Secondary | ICD-10-CM | POA: Insufficient documentation

## 2022-12-10 DIAGNOSIS — I700 Atherosclerosis of aorta: Secondary | ICD-10-CM | POA: Diagnosis not present

## 2022-12-10 DIAGNOSIS — K769 Liver disease, unspecified: Secondary | ICD-10-CM | POA: Diagnosis not present

## 2022-12-10 LAB — CBC
HCT: 46.1 % (ref 39.0–52.0)
Hemoglobin: 15.2 g/dL (ref 13.0–17.0)
MCH: 31.5 pg (ref 26.0–34.0)
MCHC: 33 g/dL (ref 30.0–36.0)
MCV: 95.4 fL (ref 80.0–100.0)
Platelets: 194 10*3/uL (ref 150–400)
RBC: 4.83 MIL/uL (ref 4.22–5.81)
RDW: 12.9 % (ref 11.5–15.5)
WBC: 7.1 10*3/uL (ref 4.0–10.5)
nRBC: 0 % (ref 0.0–0.2)

## 2022-12-10 LAB — BASIC METABOLIC PANEL
Anion gap: 7 (ref 5–15)
BUN: 14 mg/dL (ref 8–23)
CO2: 29 mmol/L (ref 22–32)
Calcium: 9.3 mg/dL (ref 8.9–10.3)
Chloride: 101 mmol/L (ref 98–111)
Creatinine, Ser: 0.89 mg/dL (ref 0.61–1.24)
GFR, Estimated: >60 mL/min (ref >60)
Glucose, Bld: 106 mg/dL — ABNORMAL HIGH (ref 70–99)
Potassium: 4 mmol/L (ref 3.5–5.1)
Sodium: 137 mmol/L (ref 135–145)

## 2022-12-10 LAB — TROPONIN I (HIGH SENSITIVITY): Troponin I (High Sensitivity): 11 ng/L (ref <18)

## 2022-12-10 NOTE — ED Triage Notes (Signed)
Patient began having centralized abdominal pain 30 minutes ago. Leg cramps in both legs. Nausea and centralized chest pain.

## 2022-12-14 ENCOUNTER — Telehealth: Payer: Self-pay | Admitting: *Deleted

## 2022-12-14 NOTE — Telephone Encounter (Signed)
Transition Care Management Follow-up Telephone Call Date of discharge and from where: [facility]  12/10/2022 How have you been since you were released from the hospital? Much better left the hospital before the visit was completed felt better than  Any questions or concerns? No  Items Reviewed: Did the pt receive and understand the discharge instructions provided? No  Medications obtained and verified? Yes  Other? No  Any new allergies since your discharge? No  Dietary orders reviewed? No Do you have support at home? No        Follow up appointments reviewed:  PCP Hospital f/u appt confirmed? Yes    Are transportation arrangements needed? No  If their condition worsens, is the pt aware to call PCP or go to the Emergency Dept.? Yes Was the patient provided with contact information for the PCP's office or ED? Yes Was to pt encouraged to call back with questions or concerns? Yes

## 2022-12-20 ENCOUNTER — Ambulatory Visit: Payer: Medicare Other | Attending: Cardiology

## 2022-12-20 ENCOUNTER — Telehealth: Payer: Self-pay | Admitting: *Deleted

## 2022-12-20 DIAGNOSIS — I513 Intracardiac thrombosis, not elsewhere classified: Secondary | ICD-10-CM | POA: Diagnosis not present

## 2022-12-20 DIAGNOSIS — Z5181 Encounter for therapeutic drug level monitoring: Secondary | ICD-10-CM | POA: Diagnosis not present

## 2022-12-20 LAB — POCT INR: INR: 2.2 (ref 2.0–3.0)

## 2022-12-20 NOTE — Patient Instructions (Signed)
Description   DO NOT HIGHLIGHT PAPERS & GIVE PRINTED PAPERWORK TO WIFE-THESE THINGS TRIGGER MIGRAINES. Continue taking 1 tablet daily EXCEPT 1/2 tablet on Mondays, Wednesdays, Fridays, and Saturdays.  Continue eating leafy veggies daily as normal.  Recheck INR in 6 weeks.  Anticoagulation Clinic [phone]

## 2022-12-20 NOTE — Telephone Encounter (Signed)
Transition Care Management Unsuccessful Follow-up Telephone Call  Date of discharge and from where:  [facility]  12/10/2022  Attempts:  2nd Attempt  Reason for unsuccessful TCM follow-up call:  No answer/busy

## 2022-12-26 ENCOUNTER — Telehealth: Payer: Self-pay | Admitting: Neurology

## 2022-12-26 NOTE — Telephone Encounter (Signed)
[facility] Morrie Sheldon) schedule delivery of Botox on 12/27/22.

## 2022-12-31 DIAGNOSIS — M79606 Pain in leg, unspecified: Secondary | ICD-10-CM | POA: Diagnosis not present

## 2023-01-01 ENCOUNTER — Encounter: Payer: Self-pay | Admitting: Emergency Medicine

## 2023-01-01 ENCOUNTER — Ambulatory Visit: Payer: Medicare Other | Admitting: Emergency Medicine

## 2023-01-01 ENCOUNTER — Ambulatory Visit (HOSPITAL_BASED_OUTPATIENT_CLINIC_OR_DEPARTMENT_OTHER): Payer: Medicare Other | Admitting: Emergency Medicine

## 2023-01-01 VITALS — BP 120/70 | HR 75 | Ht 66.0 in | Wt 155.8 lb

## 2023-01-01 DIAGNOSIS — J449 Chronic obstructive pulmonary disease, unspecified: Secondary | ICD-10-CM | POA: Diagnosis not present

## 2023-01-01 DIAGNOSIS — G4733 Obstructive sleep apnea (adult) (pediatric): Secondary | ICD-10-CM

## 2023-01-01 LAB — PULMONARY FUNCTION TEST
DL/VA % pred: 85 %
DL/VA: 3.54 ml/min/mmHg/L
DLCO cor % pred: 75 %
DLCO cor: 17.51 ml/min/mmHg
DLCO unc % pred: 76 %
DLCO unc: 17.8 ml/min/mmHg
FEF 25-75 Post: 0.92 L/s
FEF 25-75 Pre: 0.79 L/s
FEF2575-%Change-Post: 15 %
FEF2575-%Pred-Post: 41 %
FEF2575-%Pred-Pre: 35 %
FEV1-%Change-Post: 4 %
FEV1-%Pred-Post: 63 %
FEV1-%Pred-Pre: 60 %
FEV1-Post: 1.8 L
FEV1-Pre: 1.72 L
FEV1FVC-%Change-Post: -1 %
FEV1FVC-%Pred-Pre: 80 %
FEV6-%Change-Post: 4 %
FEV6-%Pred-Post: 80 %
FEV6-%Pred-Pre: 77 %
FEV6-Post: 2.92 L
FEV6-Pre: 2.8 L
FEV6FVC-%Change-Post: -1 %
FEV6FVC-%Pred-Post: 101 %
FEV6FVC-%Pred-Pre: 103 %
FVC-%Change-Post: 6 %
FVC-%Pred-Post: 79 %
FVC-%Pred-Pre: 74 %
FVC-Post: 3.06 L
FVC-Pre: 2.88 L
Post FEV1/FVC ratio: 59 %
Post FEV6/FVC ratio: 96 %
Pre FEV1/FVC ratio: 60 %
Pre FEV6/FVC Ratio: 97 %
RV % pred: 153 %
RV: 3.37 L
TLC % pred: 106 %
TLC: 6.63 L

## 2023-01-01 NOTE — Patient Instructions (Addendum)
We reviewed your pulmonary function testing today. Please temporarily stop your Breo.  We will try starting Trelegy 1 inhalation once daily.  Rinse and gargle after using.  Keep track of how this medication helps your breathing.  If you prefer it then please let our office know so we can send a prescription to your pharmacy. Keep albuterol available to use 2 puffs if needed for shortness of breath, chest tightness, wheezing. We reviewed your sleep study done 11/14/2022.  This does not show any evidence of sleep apnea. Follow with APP in 6 months Follow with Dr. Delton Coombes in 1 year, sooner if you have any problems.

## 2023-01-01 NOTE — Patient Instructions (Signed)
Full PFT Performed Today  

## 2023-01-01 NOTE — Progress Notes (Signed)
Full PFT Performed Today  

## 2023-01-01 NOTE — Assessment & Plan Note (Addendum)
Reviewed his pulmonary function testing.  We will do a trial of Trelegy, see if he prefers it.  If so we will continue it going forward.  Discussed the flu shot and the COVID-19 vaccine with him today.  He is planning to get both  We reviewed your pulmonary function testing today. Please temporarily stop your Breo.  We will try starting Trelegy 1 inhalation once daily.  Rinse and gargle after using.  Keep track of how this medication helps your breathing.  If you prefer it then please let our office know so we can send a prescription to your pharmacy. Keep albuterol available to use 2 puffs if needed for shortness of breath, chest tightness, wheezing. Follow with APP in 6 months Follow with Dr. Delton Coombes in 1 year, sooner if you have any problems.

## 2023-01-01 NOTE — Assessment & Plan Note (Signed)
His sleep study did not show any evidence of OSA.  His hypersomnolence is improved.  Question whether his hypercapnia may have been related to our decrease in his Breo frequency

## 2023-01-01 NOTE — Progress Notes (Signed)
  Subjective:    Patient ID: Kyle Walters, male    DOB: 16-Apr-1956, 26  HPI  ROV 01/01/2023 --Kyle Walters is a 16 with a history of former tobacco and asthmatic COPD, superimposed restrictive lung disease.  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  Participates in lung cancer screening program and his CT chest has shown small calcified granulomas, otherwise reassuring.  He has been managed on Breo. At our last visit we discussed trying Breo every other day.  He was seen in the emergency department 6/18 for increased daytime sleepiness and fatigue.  There was some concern for possible OSA.  He had a VBG that showed a pCO2 of 77.  He is on Neurontin, Xanax 3 times daily.  He did not desaturate on ambulation in our office 10/23/2022.  Split-night sleep study was arranged.  He underwent pulmonary function testing today.  He reports that he is no longer feeling hypersomnolent. His breathing benefited from getting back on the [place].   Sleep study 11/14/2022 showed an AHI of 0/h with oral appliance/mouthguard in place.  There was some soft snoring.  Pulmonary function testing performed today and reviewed by me, show moderately severe obstruction without a significant bronchodilator response.  His lung volumes show evidence of hyperinflation based on his RV, diffusion capacity is decreased but corrects to the normal range when adjusted for alveolar volume.  FEV1 is 1.72 L (60% predicted) compared with 65-69% predicted 11/10/2014.     Objective:   Physical Exam Vitals:   01/01/23 1529  BP: 120/70  Pulse: 75  SpO2: 95%  Weight: 155 lb 12.8 oz (70.7 kg)  Height: 5\' 6"  (1.676 m)    Gen: Pleasant, well-nourished, in no distress,  normal affect  ENT: No lesions,  mouth clear,  oropharynx clear, no postnasal drip  Neck: No JVD, no stridor  Lungs: No use of  accessory muscles, clear without rales or rhonchi  Cardiovascular: RRR, heart sounds normal, no murmur or gallops, no peripheral edema  Musculoskeletal: No deformities, no cyanosis or clubbing  Neuro: awake, appropriate, non-focal  Skin: Warm, no lesions or rashes     Assessment & Plan:  OSA (obstructive sleep apnea) His sleep study did not show any evidence of OSA.  His hypersomnolence is improved.  Question whether his hypercapnia may have been related to our decrease in his Breo frequency  COPD (chronic obstructive pulmonary disease) Reviewed his pulmonary function testing.  We will do a trial of Trelegy, see if he prefers it.  If so we will continue it going forward.  Discussed the flu shot and the COVID-19 vaccine with him today.  He is planning to get both  We reviewed your pulmonary function testing today. Please temporarily stop your Breo.  We will try starting Trelegy 1 inhalation once daily.  Rinse and gargle after using.  Keep track of how this medication helps your breathing.  If you prefer it then please let our office know so we can send a prescription to your pharmacy. Keep albuterol available to use 2 puffs if needed for shortness of breath, chest tightness, wheezing. Follow with APP in 6 months Follow with Dr. Delton Coombes in 1 year, sooner if you have any problems.    Levy Pupa, MD, PhD [facility] [phone] or if no answer [phone]

## 2023-01-05 NOTE — Progress Notes (Unsigned)
10/15/22: Botox works well. Has gone over two weeks recently without a migraine. However today he does have a migraine. Wants to proceed with injections today.   07/04/22: Botox continues to work well for him.  He states that last month he had more headaches than typical.    1213/23: Saw dr. Lucia Gaskins in November to discussed meds. Referral to academic center was mentioned.  Reports starting Emgality has helped  01/10/2022: Had a migraine yesterday. Saw Dr. Lucia Gaskins in June to discuss Medication.   10/10/2021:  Had more headaches in may. Otherwise doing ok.   BOTOX PROCEDURE NOTE FOR MIGRAINE HEADACHE    Contraindications and precautions discussed with patient(above). Aseptic procedure was observed and patient tolerated procedure. Procedure performed by Butch Penny, NP  The condition has existed for more than 6 months, and pt does not have a diagnosis of ALS, Myasthenia Gravis or Lambert-Eaton Syndrome.  Risks and benefits of injections discussed and pt agrees to proceed with the procedure.  Written consent obtained  These injections are medically necessary.  These injections do not cause sedations or hallucinations which the oral therapies may cause.  Indication/Diagnosis: chronic migraine BOTOX(J0585) injection was performed according to protocol by [employer]. 200 units of BOTOX was dissolved into 4 cc NS.   NDC: [address]-34  ***    Description of procedure:  The patient was placed in a sitting position. The standard protocol was used for Botox as follows, with 5 units of Botox injected at each site:   -Procerus muscle, midline injection  -Corrugator muscle, bilateral injection  -Frontalis muscle, bilateral injection, with 2 sites each side, medial injection was performed in the upper one third of the frontalis muscle, in the region vertical from the medial inferior edge of the superior orbital rim. The lateral injection was again in the upper one third of the forehead  vertically above the lateral limbus of the cornea, 1.5 cm lateral to the medial injection site.  -Temporalis muscle injection, 4 sites, bilaterally. The first injection was 3 cm above the tragus of the ear, second injection site was 1.5 cm to 3 cm up from the first injection site in line with the tragus of the ear. The third injection site was 1.5-3 cm forward between the first 2 injection sites. The fourth injection site was 1.5 cm posterior to the second injection site.  -Occipitalis muscle injection, 3 sites, bilaterally. The first injection was done one half way between the occipital protuberance and the tip of the mastoid process behind the ear. The second injection site was done lateral and superior to the first, 1 fingerbreadth from the first injection. The third injection site was 1 fingerbreadth superiorly and medially from the first injection site.  -Cervical paraspinal muscle injection, 2 sites, bilateral knee first injection site was 1 cm from the midline of the cervical spine, 3 cm inferior to the lower border of the occipital protuberance. The second injection site was 1.5 cm superiorly and laterally to the first injection site.  -Trapezius muscle injection was performed at 3 sites, bilaterally. The first injection site was in the upper trapezius muscle halfway between the inflection point of the neck, and the acromion. The second injection site was one half way between the acromion and the first injection site. The third injection was done between the first injection site and the inflection point of the neck.   Will return for repeat injection in 3 months.   A 200 units of Botox was used, 155 units were injected,  the rest of the Botox was wasted. The patient tolerated the procedure well, there were no complications of the above procedure.  Butch Penny, MSN, NP-C 01/05/2023, 12:21 PM [facility] [address] [place], [place] [address] [phone]

## 2023-01-07 ENCOUNTER — Ambulatory Visit: Payer: Medicare Other | Admitting: Adult Health

## 2023-01-07 DIAGNOSIS — G43711 Chronic migraine without aura, intractable, with status migrainosus: Secondary | ICD-10-CM

## 2023-01-07 MED ORDER — ONABOTULINUMTOXINA 200 UNITS IJ SOLR
155.0000 [IU] | Freq: Once | INTRAMUSCULAR | Status: AC
Start: 2023-01-07 — End: 2023-01-07
  Administered 2023-01-07: 155 [IU] via INTRAMUSCULAR

## 2023-01-07 NOTE — Progress Notes (Unsigned)
Botox- 200 units x 1 vial Lot: [id number] Expiration: 03/2025 NDC: 5409-8119-14  Bacteriostatic 0.9% Sodium Chloride- * mL  Lot: [id number] Expiration: 02/22/2024 NDC: 2130-8657-84  Dx: O96.295 S/P  Witnessed by Raynald Kemp, CMA

## 2023-01-12 ENCOUNTER — Other Ambulatory Visit: Payer: Self-pay | Admitting: Interventional Cardiology

## 2023-01-15 ENCOUNTER — Encounter: Payer: Self-pay | Admitting: Emergency Medicine

## 2023-01-17 ENCOUNTER — Ambulatory Visit: Payer: Medicare Other | Admitting: Adult Health

## 2023-01-18 ENCOUNTER — Other Ambulatory Visit: Payer: Self-pay | Admitting: Neurology

## 2023-01-18 MED ORDER — TRELEGY ELLIPTA 100-62.5-25 MCG/ACT IN AEPB: 1.0000 | INHALATION_SPRAY | Freq: Every day | RESPIRATORY_TRACT | 5 refills | Status: DC

## 2023-01-18 NOTE — Telephone Encounter (Signed)
Lm x1 for the patient. I need to know what strength of Trelegy he was given.

## 2023-01-18 NOTE — Telephone Encounter (Signed)
PT now calling as well for more Trelegy.  Pharm is [facility] on [address]

## 2023-01-18 NOTE — Telephone Encounter (Signed)
The patient called back and said he was given the Trelegy 100mg .  I have sent in the prescription and the patient is aware.   Nothing further needed.

## 2023-01-19 ENCOUNTER — Other Ambulatory Visit: Payer: Self-pay

## 2023-01-19 ENCOUNTER — Emergency Department (HOSPITAL_COMMUNITY): Payer: Medicare Other

## 2023-01-19 ENCOUNTER — Encounter (HOSPITAL_COMMUNITY): Payer: Self-pay

## 2023-01-19 ENCOUNTER — Emergency Department (HOSPITAL_COMMUNITY)
Admission: EM | Admit: 2023-01-19 | Discharge: 2023-01-20 | Disposition: A | Discharge status: Home / Self Care | Payer: Medicare Other | Attending: Emergency Medicine | Admitting: Emergency Medicine

## 2023-01-19 DIAGNOSIS — I2510 Atherosclerotic heart disease of native coronary artery without angina pectoris: Secondary | ICD-10-CM | POA: Diagnosis not present

## 2023-01-19 DIAGNOSIS — R0789 Other chest pain: Secondary | ICD-10-CM | POA: Diagnosis not present

## 2023-01-19 DIAGNOSIS — Z955 Presence of coronary angioplasty implant and graft: Secondary | ICD-10-CM | POA: Diagnosis not present

## 2023-01-19 DIAGNOSIS — R490 Dysphonia: Secondary | ICD-10-CM | POA: Diagnosis not present

## 2023-01-19 DIAGNOSIS — I10 Essential (primary) hypertension: Secondary | ICD-10-CM | POA: Diagnosis not present

## 2023-01-19 DIAGNOSIS — J449 Chronic obstructive pulmonary disease, unspecified: Secondary | ICD-10-CM | POA: Insufficient documentation

## 2023-01-19 DIAGNOSIS — Z7901 Long term (current) use of anticoagulants: Secondary | ICD-10-CM | POA: Diagnosis not present

## 2023-01-19 DIAGNOSIS — R0602 Shortness of breath: Secondary | ICD-10-CM | POA: Insufficient documentation

## 2023-01-19 DIAGNOSIS — Z7951 Long term (current) use of inhaled steroids: Secondary | ICD-10-CM | POA: Insufficient documentation

## 2023-01-19 DIAGNOSIS — J4489 Other specified chronic obstructive pulmonary disease: Secondary | ICD-10-CM | POA: Diagnosis not present

## 2023-01-19 MED ORDER — ALBUTEROL SULFATE HFA 108 (90 BASE) MCG/ACT IN AERS: 2.0000 | INHALATION_SPRAY | RESPIRATORY_TRACT | Status: DC | PRN

## 2023-01-19 NOTE — ED Triage Notes (Addendum)
Pt reports with SHOB and hoarseness since tonight. Pt reports having COPD.

## 2023-01-20 ENCOUNTER — Encounter (HOSPITAL_COMMUNITY): Payer: Self-pay

## 2023-01-20 LAB — CBC WITH DIFFERENTIAL/PLATELET
Abs Immature Granulocytes: 0.02 10*3/uL (ref 0.00–0.07)
Basophils Absolute: 0.1 10*3/uL (ref 0.0–0.1)
Basophils Relative: 1 %
Eosinophils Absolute: 0.2 10*3/uL (ref 0.0–0.5)
Eosinophils Relative: 3 %
HCT: 40.5 % (ref 39.0–52.0)
Hemoglobin: 13 g/dL (ref 13.0–17.0)
Immature Granulocytes: 0 %
Lymphocytes Relative: 34 %
Lymphs Abs: 2.1 10*3/uL (ref 0.7–4.0)
MCH: 30.8 pg (ref 26.0–34.0)
MCHC: 32.1 g/dL (ref 30.0–36.0)
MCV: 96 fL (ref 80.0–100.0)
Monocytes Absolute: 0.6 10*3/uL (ref 0.1–1.0)
Monocytes Relative: 10 %
Neutro Abs: 3.1 10*3/uL (ref 1.7–7.7)
Neutrophils Relative %: 52 %
Platelets: 204 10*3/uL (ref 150–400)
RBC: 4.22 MIL/uL (ref 4.22–5.81)
RDW: 13.4 % (ref 11.5–15.5)
WBC: 6.1 10*3/uL (ref 4.0–10.5)
nRBC: 0 % (ref 0.0–0.2)

## 2023-01-20 LAB — BASIC METABOLIC PANEL
Anion gap: 8 (ref 5–15)
BUN: 19 mg/dL (ref 8–23)
CO2: 26 mmol/L (ref 22–32)
Calcium: 8.6 mg/dL — ABNORMAL LOW (ref 8.9–10.3)
Chloride: 99 mmol/L (ref 98–111)
Creatinine, Ser: 0.79 mg/dL (ref 0.61–1.24)
GFR, Estimated: >60 mL/min (ref >60)
Glucose, Bld: 121 mg/dL — ABNORMAL HIGH (ref 70–99)
Potassium: 3.4 mmol/L — ABNORMAL LOW (ref 3.5–5.1)
Sodium: 133 mmol/L — ABNORMAL LOW (ref 135–145)

## 2023-01-20 LAB — TROPONIN I (HIGH SENSITIVITY)
Troponin I (High Sensitivity): 11 ng/L (ref <18)
Troponin I (High Sensitivity): 9 ng/L (ref <18)

## 2023-01-20 MED ORDER — IPRATROPIUM-ALBUTEROL 0.5-2.5 (3) MG/3ML IN SOLN
3.0000 mL | Freq: Once | RESPIRATORY_TRACT | Status: AC
  Administered 2023-01-20: 3 mL via RESPIRATORY_TRACT
  Filled 2023-01-20: qty 3

## 2023-01-20 NOTE — ED Provider Notes (Signed)
[place] EMERGENCY DEPARTMENT AT [facility] Provider Note  CSN: [id number] Arrival date & time: 01/19/23 2252  Chief Complaint(s) Shortness of Breath  HPI Kyle Walters is a 67 y.o. male with a past medical history listed below including COPD/emphysema who recently changed from [facility] to Trelegy but did not have his dose today due to the pharmacy being closed.  He presents for shortness of breath.  States that he has been having hoarse voice over the past 3 days which became worse this evening.  The hoarseness and shortness of breath lasted for a few hours and resolved approximately 30 minutes ago.  He denied any associated chest pain.  States that the shortness of breath felt like he could not get a full breath in.  He endorsed chest tightness but states that this is unchanged from his normal chest tightness.  No fevers or chills.  No URI symptoms.  States he felt anxious due to the SOB. No other physical complaints.  The history is provided by the patient.    Past Medical History Past Medical History:  Diagnosis Date   ADHD (attention deficit hyperactivity disorder)    Allergy    Anemia    Anxiety panic attack   Arthritis    COPD (chronic obstructive pulmonary disease) (HCC)    bronchitis/emphysema   Coronary artery disease    Depression    Emphysema lung (HCC)    Emphysema of lung (HCC)    Enlarged prostate    Fibromyalgia    GERD (gastroesophageal reflux disease)    Hyperlipidemia    Hypertension    IBS (irritable bowel syndrome)    Migraine    Myocardial infarction (HCC)    Panic attack    Patient Active Problem List   Diagnosis Date Noted   OSA (obstructive sleep apnea) 11/14/2022   Daytime sleepiness 10/23/2022   History of tobacco use 09/07/2021   Encounter for therapeutic drug monitoring 04/03/2021   Thrombus in heart chamber 04/03/2021   Hyperlipidemia 03/14/2021   Coronary artery disease    Chronic migraine without aura, with intractable  migraine, so stated, with status migrainosus 07/14/2020   Severe episode of recurrent major depressive disorder, without psychotic features (HCC) 07/14/2020   Polyclonal gammopathy determined by serum protein electrophoresis 02/25/2018   Primary osteoarthritis of both feet 12/13/2017   History of BPH 12/13/2017   Family history of psoriasis in mother 12/13/2017   History of gastroesophageal reflux (GERD) 12/13/2017   Anxiety and depression 12/13/2017   COPD (chronic obstructive pulmonary disease) (HCC) 01/29/2013   CHEST PAIN-PRECORDIAL 11/01/2009   Primary osteoarthritis of both hands 10/12/2009   CHEST PAIN 10/12/2009   Home Medication(s) Prior to Admission medications   Medication Sig Start Date End Date Taking? Authorizing Provider  ALPRAZolam Prudy Feeler) 1 MG tablet Take 0.5 mg by mouth 5 (five) times daily as needed for anxiety.   Yes [provider]  Atogepant (QULIPTA) 60 MG TABS Take 1 tablet by mouth daily.   Yes [provider]  B Complex-Biotin-FA (B-50 COMPLEX PO) Take 1 capsule by mouth daily.   Yes [provider]  botulinum toxin Type A (BOTOX) 200 units injection PROVIDER TO INJECT 155 UNITS INTO MUSCLES OF HEAD AND NECK EVERY 3 MONTHS. DISCARD REMAINDER. 06/14/22  Yes Anson Fret, MD  Cholecalciferol (VITAMIN D3) 2000 UNITS TABS Take 4,000 Units by mouth daily after breakfast.   Yes [provider]  clopidogrel (PLAVIX) 75 MG tablet TAKE 1 TABLET BY MOUTH DAILY WITH  BREAKFAST. 05/28/22  Yes Corky Crafts, MD  doxazosin (CARDURA) 8 MG tablet Take 8 mg by mouth daily.   Yes [provider]  escitalopram (LEXAPRO) 20 MG tablet Take 10 mg by mouth in the morning. 10/26/14  Yes [provider]  Ferrous Sulfate (IRON PO) Take 65 mg by mouth daily after breakfast. Hema Plex Iron   Yes [provider]  Fluticasone-Umeclidin-Vilant (TRELEGY ELLIPTA) 100-62.5-25 MCG/ACT AEPB Inhale 1 puff into the lungs daily. 01/18/23   Yes Leslye Peer, MD  gabapentin (NEURONTIN) 400 MG capsule Take 400 mg by mouth 4 (four) times daily.    Yes [provider]  Galcanezumab-gnlm (EMGALITY) 120 MG/ML SOAJ Inject 120 mg into the skin every 30 (thirty) days. 02/22/22  Yes Anson Fret, MD  memantine (NAMENDA) 10 MG tablet TAKE 2 TABLETS BY MOUTH IN THE MORNING AND 1 TABLET IN THE LATE AFTERNOON 11/06/22  Yes Anson Fret, MD  Multiple Vitamin (MULTIVITAMIN WITH MINERALS) TABS tablet Take 1 tablet by mouth daily after breakfast. Centrum Silver for Men   Yes [provider]  nitroGLYCERIN (NITROSTAT) 0.4 MG SL tablet Place 1 tablet (0.4 mg total) under the tongue every 5 (five) minutes as needed. 03/14/21  Yes Arty Baumgartner, NP  Omega-3 Fatty Acids (FISH OIL) 1200 MG CAPS Take 1,200 mg by mouth daily after breakfast.   Yes [provider]  rosuvastatin (CRESTOR) 20 MG tablet TAKE 1 TABLET BY MOUTH EVERY DAY 01/14/23  Yes Corky Crafts, MD  silodosin (RAPAFLO) 8 MG CAPS capsule Take 8 mg by mouth daily with breakfast.   Yes [provider]  Ubrogepant (UBRELVY) 100 MG TABS TAKE 1 TABLET MOUTH AS NEEDED AS CLOSE TO MIGRAINE ONSET AS POSSIBLE. MAY REPEAT IN 2 HOURS IF NEEDED. MAX 200 MG PER 24 HOURS. Patient taking differently: Take 100 mg by mouth as directed. Take 1 tablet as close to migraine onset as possible. May repeat in 2 hours if needed, max dose 200 mg /daily. 06/01/21  Yes Anson Fret, MD  vitamin B-12 (CYANOCOBALAMIN) 500 MCG tablet Take 500 mcg by mouth daily.   Yes [provider]  warfarin (COUMADIN) 5 MG tablet TAKE 1/2 TO 1 TABLET BY MOUTH ONCE DAILY AS DIRECTED BY ANTICOAGULATION CLINIC Patient taking differently: Take 2.5-5 mg by mouth as directed. Take 5 mg on (Sun, Tues, Thurs) & Take 1/2 tablet (2.5 mg) all other days as directed by Anticoagulation Clinic 07/16/22  Yes Corky Crafts, MD  albuterol (VENTOLIN HFA) 108 (90 Base) MCG/ACT inhaler Inhale  2 puffs into the lungs every 6 (six) hours as needed for wheezing or shortness of breath. Patient not taking: Reported on 01/07/2023 10/23/22   Parrett, Virgel Bouquet, NP  EPINEPHrine 0.3 mg/0.3 mL IJ SOAJ injection Inject 0.3 mg into the muscle as needed. 10/07/22   [provider]  Nerve Stimulator (NERIVIO) DEVI USE WITHIN 1 HR OF MIGRAINE. SET A STRONG YET COMFORTABLE INTENSITY LEVEL & MAINTAIN LEVEL FOR 45 MINS 06/28/22   Anson Fret, MD  sucralfate (CARAFATE) 1 g tablet Take 1 tablet (1 g total) by mouth 4 (four) times daily as needed. Patient not taking: Reported on 01/07/2023 11/12/22   Sabas Sous, MD  Allergies Plasticized base [plastibase], Tetracycline, Triptans, Effexor [venlafaxine hcl], Prednisone, and Sulfamethoxazole-trimethoprim  Review of Systems Review of Systems As noted in HPI  Physical Exam Vital Signs  I have reviewed the triage vital signs BP 123/71 (BP Location: Left Arm)   Pulse 81   Temp 98.4 F (36.9 C) (Oral)   Resp 18   Ht 5\' 6"  (1.676 m)   Wt 70.3 kg   SpO2 97%   BMI 25.02 kg/m   Physical Exam Vitals reviewed.  Constitutional:      General: He is not in acute distress.    Appearance: He is well-developed. He is not diaphoretic.  HENT:     Head: Normocephalic and atraumatic.     Nose: Nose normal.     Mouth/Throat:     Mouth: No angioedema.     Pharynx: Oropharynx is clear. No uvula swelling.  Eyes:     General: No scleral icterus.       Right eye: No discharge.        Left eye: No discharge.     Conjunctiva/sclera: Conjunctivae normal.     Pupils: Pupils are equal, round, and reactive to light.  Cardiovascular:     Rate and Rhythm: Normal rate and regular rhythm.     Heart sounds: No murmur heard.    No friction rub. No gallop.  Pulmonary:     Effort: Pulmonary effort is normal. No respiratory distress.      Breath sounds: Normal breath sounds. No stridor. No rales.  Abdominal:     General: There is no distension.     Palpations: Abdomen is soft.     Tenderness: There is no abdominal tenderness.  Musculoskeletal:        General: No tenderness.     Cervical back: Normal range of motion and neck supple.  Skin:    General: Skin is warm and dry.     Findings: No erythema or rash.  Neurological:     Mental Status: He is alert and oriented to person, place, and time.     ED Results and Treatments Labs (all labs ordered are listed, but only abnormal results are displayed) Labs Reviewed  BASIC METABOLIC PANEL - Abnormal; Notable for the following components:      Result Value   Sodium 133 (*)    Potassium 3.4 (*)    Glucose, Bld 121 (*)    Calcium 8.6 (*)    All other components within normal limits  CBC WITH DIFFERENTIAL/PLATELET  TROPONIN I (HIGH SENSITIVITY)  TROPONIN I (HIGH SENSITIVITY)                                                                                                                         EKG  EKG Interpretation Date/Time:  Saturday January 19 2023 23:07:12 EDT Ventricular Rate:  56 PR Interval:  146 QRS Duration:  77 QT Interval:  452 QTC Calculation: 437 R Axis:   86  Text Interpretation: Sinus rhythm  Borderline right axis deviation No significant change was found Confirmed by Drema Pry [phone]) on 01/19/2023 11:11:05 PM       Radiology DG Chest 2 View  Result Date: 01/19/2023 CLINICAL DATA:  Shortness of breath and hoarseness tonight. History of COPD. EXAM: CHEST - 2 VIEW COMPARISON:  12/10/2022 FINDINGS: Heart size and pulmonary vascularity are normal. Emphysematous changes and scattered fibrosis in the lungs are similar to prior study. No developing airspace disease or consolidation. No pleural effusions. No pneumothorax. Mediastinal contours appear intact. Coronary artery stents are demonstrated. Degenerative changes in the spine and  shoulders. IMPRESSION: Emphysematous and chronic bronchitic changes in the lungs. No focal consolidation. Electronically Signed   By: Burman Nieves M.D.   On: 01/19/2023 23:36    Medications Ordered in ED Medications  ipratropium-albuterol (DUONEB) 0.5-2.5 (3) MG/3ML nebulizer solution 3 mL (3 mLs Nebulization Given 01/20/23 0111)   Procedures Procedures  (including critical care time) Medical Decision Making / ED Course   Medical Decision Making Amount and/or Complexity of Data Reviewed Labs: ordered. Decision-making details documented in ED Course. Radiology: ordered and independent interpretation performed. Decision-making details documented in ED Course. ECG/medicine tests: ordered and independent interpretation performed. Decision-making details documented in ED Course.  Risk Prescription drug management.    Shortness of breath and hoarse voice  No evidence of angioedema.  Unlikely allergic reaction/anaphylaxis. No stridor concerning for epiglottitis. No PTA or evidence of RPA on exam.  Lungs clear to auscultation bilaterally with good air movement the patient still states that he feels some tightness. Chest x-ray without evidence of pneumonia, pneumothorax, pulmonary edema pleural effusions.  Patient has emphysematous changes with chronic bronchitis. EKG without acute ischemic changes, dysrhythmias or blocks. Trops negative x2. Unlikely ACS. Doubt pulmonary embolism.  CBC without leukocytosis or anemia.  Metabolic panel without significant electrolyte derangements or renal sufficiency.    Patient provided with DuoNeb.  Clinical Course as of 01/20/23 1736  Sun Jan 20, 2023  0230 Chest tightness improved after Duoneb. Declined additional doses.  [PC]    Clinical Course User Index [PC] Seann Genther, Amadeo Garnet, MD    Final Clinical Impression(s) / ED Diagnoses Final diagnoses:  Shortness of breath   The patient appears reasonably screened and/or stabilized for  discharge and I doubt any other medical condition or other [facility] requiring further screening, evaluation, or treatment in the ED at this time. I have discussed the findings, Dx and Tx plan with the patient/family who expressed understanding and agree(s) with the plan. Discharge instructions discussed at length. The patient/family was given strict return precautions who verbalized understanding of the instructions. No further questions at time of discharge.  Disposition: Discharge  Condition: Good  ED Discharge Orders     None        Follow Up: Rometta Emery, MD [address] [place] [address] [phone]  Call  to schedule an appointment for close follow up    This chart was dictated using voice recognition software.  Despite best efforts to proofread,  errors can occur which can change the documentation meaning.    Nira Conn, MD 01/20/23 1736

## 2023-01-21 ENCOUNTER — Encounter: Payer: Self-pay | Admitting: Emergency Medicine

## 2023-01-21 ENCOUNTER — Telehealth: Payer: Self-pay | Admitting: Student

## 2023-01-21 MED ORDER — TRELEGY ELLIPTA 100-62.5-25 MCG/ACT IN AEPB: 1.0000 | INHALATION_SPRAY | Freq: Every day | RESPIRATORY_TRACT | 5 refills | Status: DC

## 2023-01-21 NOTE — Telephone Encounter (Signed)
Pt spouse calling in bc the script for trelegy has not been filled Pharmacy: [facility] on [address]

## 2023-01-21 NOTE — Addendum Note (Signed)
Addended by: Bonney Leitz on: 01/21/2023 01:39 PM   Modules accepted: Orders

## 2023-01-22 ENCOUNTER — Other Ambulatory Visit: Payer: Self-pay

## 2023-01-22 ENCOUNTER — Emergency Department (HOSPITAL_COMMUNITY): Payer: Medicare Other

## 2023-01-22 ENCOUNTER — Ambulatory Visit: Payer: Medicare Other | Attending: Cardiology | Admitting: Cardiology

## 2023-01-22 ENCOUNTER — Encounter (HOSPITAL_COMMUNITY): Payer: Self-pay | Admitting: Emergency Medicine

## 2023-01-22 ENCOUNTER — Emergency Department (HOSPITAL_COMMUNITY)
Admission: EM | Admit: 2023-01-22 | Discharge: 2023-01-22 | Discharge status: Against Medical Advice | Payer: Medicare Other | Attending: Emergency Medicine | Admitting: Emergency Medicine

## 2023-01-22 ENCOUNTER — Encounter: Payer: Self-pay | Admitting: Cardiology

## 2023-01-22 VITALS — BP 118/66 | HR 63 | Ht 66.0 in | Wt 155.6 lb

## 2023-01-22 DIAGNOSIS — I5022 Chronic systolic (congestive) heart failure: Secondary | ICD-10-CM | POA: Diagnosis not present

## 2023-01-22 DIAGNOSIS — I513 Intracardiac thrombosis, not elsewhere classified: Secondary | ICD-10-CM

## 2023-01-22 DIAGNOSIS — E785 Hyperlipidemia, unspecified: Secondary | ICD-10-CM

## 2023-01-22 DIAGNOSIS — Z5321 Procedure and treatment not carried out due to patient leaving prior to being seen by health care provider: Secondary | ICD-10-CM | POA: Diagnosis not present

## 2023-01-22 DIAGNOSIS — R918 Other nonspecific abnormal finding of lung field: Secondary | ICD-10-CM | POA: Diagnosis not present

## 2023-01-22 DIAGNOSIS — I2510 Atherosclerotic heart disease of native coronary artery without angina pectoris: Secondary | ICD-10-CM

## 2023-01-22 DIAGNOSIS — R0602 Shortness of breath: Secondary | ICD-10-CM | POA: Diagnosis not present

## 2023-01-22 DIAGNOSIS — Z955 Presence of coronary angioplasty implant and graft: Secondary | ICD-10-CM | POA: Diagnosis not present

## 2023-01-22 DIAGNOSIS — J449 Chronic obstructive pulmonary disease, unspecified: Secondary | ICD-10-CM

## 2023-01-22 DIAGNOSIS — R072 Precordial pain: Secondary | ICD-10-CM

## 2023-01-22 DIAGNOSIS — J8410 Pulmonary fibrosis, unspecified: Secondary | ICD-10-CM | POA: Diagnosis not present

## 2023-01-22 LAB — CBC
HCT: 39.7 % (ref 39.0–52.0)
Hemoglobin: 13.2 g/dL (ref 13.0–17.0)
MCH: 31.4 pg (ref 26.0–34.0)
MCHC: 33.2 g/dL (ref 30.0–36.0)
MCV: 94.5 fL (ref 80.0–100.0)
Platelets: 209 10*3/uL (ref 150–400)
RBC: 4.2 MIL/uL — ABNORMAL LOW (ref 4.22–5.81)
RDW: 13.2 % (ref 11.5–15.5)
WBC: 5.7 10*3/uL (ref 4.0–10.5)
nRBC: 0 % (ref 0.0–0.2)

## 2023-01-22 LAB — BASIC METABOLIC PANEL
Anion gap: 10 (ref 5–15)
BUN: 15 mg/dL (ref 8–23)
CO2: 26 mmol/L (ref 22–32)
Calcium: 8.9 mg/dL (ref 8.9–10.3)
Chloride: 100 mmol/L (ref 98–111)
Creatinine, Ser: 0.9 mg/dL (ref 0.61–1.24)
GFR, Estimated: >60 mL/min (ref >60)
Glucose, Bld: 100 mg/dL — ABNORMAL HIGH (ref 70–99)
Potassium: 3.8 mmol/L (ref 3.5–5.1)
Sodium: 136 mmol/L (ref 135–145)

## 2023-01-22 LAB — TROPONIN I (HIGH SENSITIVITY)
Troponin I (High Sensitivity): 11 ng/L (ref <18)
Troponin I (High Sensitivity): 10 ng/L (ref <18)

## 2023-01-22 MED ORDER — ALBUTEROL SULFATE HFA 108 (90 BASE) MCG/ACT IN AERS
2.0000 | INHALATION_SPRAY | RESPIRATORY_TRACT | Status: DC | PRN
  Administered 2023-01-22: 2 via RESPIRATORY_TRACT
  Filled 2023-01-22: qty 6.7

## 2023-01-22 NOTE — Progress Notes (Signed)
Cardiology Office Note:   Date:  01/22/2023  ID:  Kyle Walters, DOB [birth year], MRN [id number] PCP: Rometta Emery, MD   [facility] Providers Cardiologist:  Lance Muss, MD    History of Present Illness:   Discussed the use of AI scribe software for clinical note transcription with the patient, who gave verbal consent to proceed.  History of Present Illness   Kyle Walters, a 67 year old individual with a history of apical thrombus, coronary artery disease, COPD (followed by pulmonology) and bipolar disorder, presents for a six-month follow-up visit. The patient was previously treated with Eliquis for three months for the apical thrombus, and underwent cardiac catheterization in November 2022 due to occluded LAD and significant disease in the RCA noted on CT. The LHC revealed 100% stenosis of the mid LAD with left to left collaterals and RPAV lesion with left to right collaterals, and 90% stenosis in the proximal RCA with 75% stenosis of mid RCA. 2 drug-eluting stents were successfully placed in the right coronary artery, reducing the stenosis to 0%. The patient was initially on DAPT with ASA and Plavix but was then switched to Plavix and warfarin due to concerns of recurrent thrombus formation with akinesis of LV apex.  Today patient is here for both acute symptoms and 6 month follow up. He reports recent episodes of shortness of breath over the past 2 weeks following a recent change in inhalers from [facility] to Trelegy. He also reports experiencing chest tightness and difficulty exhaling both at rest and with exertion  leading to two recent visits to the emergency department, most recently today. Patient reports that he developed a migraine in the ED today and left before being evaluated by a provider. Patient does not report a clear improvement in symptoms with use of albuterol/rescue inhaler.  He also reports a decrease in exercise tolerance, with "difficulty elevating his pulse during  exercise." The patient experiences chronic chest pain, which he attributes in part to his panic disorder and spinal arthritis but does admit to more significant discomfort recently.  The patient reports a recent diagnosis of bipolar disorder and is considering further change in his inhaler medication due to concerns about the steroid component potentially exacerbating his mental health condition/stress.   Regarding longer term cardiac history since his last follow up with Dr. Eldridge Dace 6 months ago, patient's resting heart rate has been noted to be in the upper forties at times, and he reports occasional orthostatic dizziness. He denies any recent swelling in the legs or feet, bloody stools or urine, coughing up blood, or problems laying flat. Other than the past few weeks, has had stable exertional tolerance and chest discomfort.     At the time of exam today, patient is currently without chest pain, shortness of breath, lower extremity edema. He denies palpitations, melena, hematuria, hemoptysis, diaphoresis, weakness, presyncope, syncope, orthopnea, and PND.   Studies Reviewed:    EKG:       ECG from the ED on 01/22/23 reviewed. NSR with stable anterior lead TWI. No acute ischemia noted.  Risk Assessment/Calculations:              Physical Exam:   VS:  BP 118/66   Pulse 63   Ht 5\' 6"  (1.676 m)   Wt 155 lb 9.6 oz (70.6 kg)   SpO2 97%   BMI 25.11 kg/m    Wt Readings from Last 3 Encounters:  01/22/23 155 lb 9.6 oz (70.6 kg)  01/22/23 154 lb 5.2 oz (70  kg)  01/19/23 155 lb (70.3 kg)     Physical Exam Vitals reviewed.  Constitutional:      Appearance: Normal appearance.  HENT:     Head: Normocephalic.  Eyes:     Pupils: Pupils are equal, round, and reactive to light.  Cardiovascular:     Rate and Rhythm: Regular rhythm. Bradycardia present.     Pulses: Normal pulses.     Heart sounds: Normal heart sounds.  Pulmonary:     Effort: Pulmonary effort is normal.     Breath  sounds: Normal breath sounds.  Abdominal:     General: Abdomen is flat.     Palpations: Abdomen is soft.  Musculoskeletal:     Right lower leg: No edema.     Left lower leg: No edema.  Skin:    General: Skin is warm and dry.     Capillary Refill: Capillary refill takes less than 2 seconds.  Neurological:     General: No focal deficit present.     Mental Status: He is alert and oriented to person, place, and time.  Psychiatric:        Mood and Affect: Mood normal.        Behavior: Behavior normal.        Thought Content: Thought content normal.        Judgment: Judgment normal.     Physical Exam   VITALS: BP- 118/66, P- 55       ASSESSMENT AND PLAN:      Coronary Artery Disease  History of 100% stenosis of mid LAD and RCA, treated with drug-eluting stents. Recent episodes of chest tightness and shortness of breath, but cardiac enzymes and EKG in the ED have been reassuring. -Continue antiplatelet therapy with Plavix. -Suspect that chest discomfort is of pulmonary cause but difficult to fully determine from patient's description of symptoms.  -Order cardiac PET stress test to evaluate for changes in coronary perfusion given recent chest discomfort. Patient agreeable with this plan -Order complete echocardiogram -Given atypical symptoms and hx frequent headaches, will defer initiation of nitrate based anti-anginal therapy such as Imdur.   Informed Consent   Shared Decision Making/Informed Consent The risks [chest pain, shortness of breath, cardiac arrhythmias, dizziness, blood pressure fluctuations, myocardial infarction, stroke/transient ischemic attack, nausea, vomiting, allergic reaction, radiation exposure, metallic taste sensation and life-threatening complications (estimated to be 1 in 10,000)], benefits (risk stratification, diagnosing coronary artery disease, treatment guidance) and alternatives of a cardiac PET stress test were discussed in detail with Kyle Walters and he  agrees to proceed.     Hyperlipidemia  -Repeat LDL direct to assess cholesterol control. 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 Eldridge Dace due to concern for recurrent thrombus formation. -Continue Warfarin. -Order echocardiogram with contrast to assess left ventricular function  HFpEF (50-55% with apical akinesis)  Blood pressure control limits options for additional heart failure medications. -Encouraged patient to follow up with PCP regarding adjusting BPH medications to allow for initiation of GDMT. -Consider initiation of SGLT2 inhibitor. Will inquire about cost with pharmacy. -Repeat echocardiogram  Chronic Dyspnea with acute exacerbation COPD  Recent worsening of shortness of breath, possibly related to medication changes. -Continue follow-up with pulmonology. -Encourage consistent use of inhalers.  Benign Prostatic Hyperplasia (BPH)  Currently managed with Cardura and Silodosin. -Consider adjusting medications to  allow for initiation of heart failure medications as above.   Follow-up in 2 months to review results of echocardiogram/PET stress test, and discuss potential medication adjustments.      Signed, Perlie Gold, PA-C

## 2023-01-22 NOTE — ED Triage Notes (Signed)
Presents with [facility] since Saturday night. Was seen at [facility] ER at that time and improved with a breathing treatment. He was feeling better and attempted to exercise today and became worse after 10 minutes of exertion. Used albuterol at 10pm and at 11:30pm via inhaler but continues to feel [facility]  H/o COPD Not on oxygen  In triage, speaks in complete sentences, NAD

## 2023-01-22 NOTE — ED Notes (Signed)
Pt stated he wants to go home, and pt seen leaving ED

## 2023-01-22 NOTE — Telephone Encounter (Signed)
Pt called to check on refill for due to have been sent to the pharmacy

## 2023-01-22 NOTE — Patient Instructions (Signed)
Medication Instructions:  Your physician recommends that you continue on your current medications as directed. Please refer to the Current Medication list given to you today.  *If you need a refill on your cardiac medications before your next appointment, please call your pharmacy*   Lab Work: None  If you have labs (blood work) drawn today and your tests are completely normal, you will receive your results only by: MyChart Message (if you have MyChart) OR A paper copy in the mail If you have any lab test that is abnormal or we need to change your treatment, we will call you to review the results.   Testing/Procedures: Your physician has requested that you have an echocardiogram. Echocardiography is a painless test that uses sound waves to create images of your heart. It provides your doctor with information about the size and shape of your heart and how well your heart's chambers and valves are working. This procedure takes approximately one hour. There are no restrictions for this procedure. Please do NOT wear cologne, perfume, aftershave, or lotions (deodorant is allowed). Please arrive 15 minutes prior to your appointment time.  How to Prepare for Your Cardiac PET/CT Stress Test:  1. Please do not take these medications before your test:   Medications that may interfere with the cardiac pharmacological stress agent (ex. nitrates - including erectile dysfunction medications, isosorbide mononitrate, tamulosin or beta-blockers) the day of the exam. (Erectile dysfunction medication should be held for at least 72 hrs prior to test) Theophylline containing medications for 12 hours. Dipyridamole 48 hours prior to the test. Your remaining medications may be taken with water.  2. Nothing to eat or drink, except water, 3 hours prior to arrival time.   NO caffeine/decaffeinated products, or chocolate 12 hours prior to arrival.  3. NO perfume, cologne or lotion on chest or abdomen area.           4. Total time is 1 to 2 hours; you may want to bring reading material for the waiting time.  5. Please report to Radiology at the [facility] Main Entrance 30 minutes early for your test.  [address] [place], [place] [address]  6. Please report to Radiology at [facility] Main Entrance, medical mall, 30 mins prior to your test.  [address]  [place], [place]  [phone]   In preparation for your appointment, medication and supplies will be purchased.  Appointment availability is limited, so if you need to cancel or reschedule, please call the Radiology Department at [phone] [facility]) OR [phone] [facility])  24 hours in advance to avoid a cancellation fee of $100.00  What to Expect After you Arrive:  Once you arrive and check in for your appointment, you will be taken to a preparation room within the Radiology Department.  A technologist or Nurse will obtain your medical history, verify that you are correctly prepped for the exam, and explain the procedure.  Afterwards,  an IV will be started in your arm and electrodes will be placed on your skin for EKG monitoring during the stress portion of the exam. Then you will be escorted to the PET/CT scanner.  There, staff will get you positioned on the scanner and obtain a blood pressure and EKG.  During the exam, you will continue to be connected to the EKG and blood pressure machines.  A small, safe amount of a radioactive tracer will be injected in your IV to obtain a series of pictures  of your heart along with an injection of a stress agent.    After your Exam:  It is recommended that you eat a meal and drink a caffeinated beverage to counter act any effects of the stress agent.  Drink plenty of fluids for the remainder of the day and urinate frequently for the first couple of hours after the exam.  Your doctor will inform you of your test results within 7-10 business days.  For more  information and frequently asked questions, please visit our website : [email or web address]  For questions about your test or how to prepare for your test, please call: Cardiac Imaging Nurse Navigators Office: [phone]    Follow-Up: At [facility], you and your health needs are our priority.  As part of our continuing mission to provide you with exceptional heart care, we have created designated Provider Care Teams.  These Care Teams include your primary Cardiologist (physician) and Advanced Practice Providers (APPs -  Physician Assistants and Nurse Practitioners) who all work together to provide you with the care you need, when you need it.  We recommend signing up for the patient portal called "MyChart".  Sign up information is provided on this After Visit Summary.  MyChart is used to connect with patients for Virtual Visits (Telemedicine).  Patients are able to view lab/test results, encounter notes, upcoming appointments, etc.  Non-urgent messages can be sent to your provider as well.   To learn more about what you can do with MyChart, go to [email or web address].    Your next appointment:   2 month(s)  Provider:   DR. Ottie Glazier

## 2023-01-23 ENCOUNTER — Other Ambulatory Visit: Payer: Self-pay | Admitting: *Deleted

## 2023-01-23 LAB — LDL CHOLESTEROL, DIRECT: LDL Direct: 38 mg/dL (ref 0–99)

## 2023-01-23 MED ORDER — NERIVIO DEVI: 11 refills | Status: AC

## 2023-01-23 NOTE — Telephone Encounter (Signed)
Already done previously

## 2023-01-23 NOTE — Telephone Encounter (Signed)
Pt called stating that he is needing this approved as soon as possible. Please advise.

## 2023-01-24 DIAGNOSIS — I2510 Atherosclerotic heart disease of native coronary artery without angina pectoris: Secondary | ICD-10-CM

## 2023-01-24 DIAGNOSIS — E785 Hyperlipidemia, unspecified: Secondary | ICD-10-CM

## 2023-01-24 NOTE — Telephone Encounter (Signed)
Trelegy sent to pharmacy 01/21/2023. Called patient and verified that Rx has been filled and picked up. Nothing further needed.

## 2023-01-29 MED ORDER — STIOLTO RESPIMAT 2.5-2.5 MCG/ACT IN AERS: 2.0000 | INHALATION_SPRAY | Freq: Every day | RESPIRATORY_TRACT | 11 refills | Status: DC

## 2023-01-29 NOTE — Addendum Note (Signed)
Addended by: Christen Butter on: 01/29/2023 04:39 PM   Modules accepted: Orders

## 2023-01-31 ENCOUNTER — Ambulatory Visit: Payer: Medicare Other | Attending: Internal Medicine | Admitting: *Deleted

## 2023-01-31 DIAGNOSIS — I513 Intracardiac thrombosis, not elsewhere classified: Secondary | ICD-10-CM | POA: Diagnosis not present

## 2023-01-31 DIAGNOSIS — Z5181 Encounter for therapeutic drug level monitoring: Secondary | ICD-10-CM | POA: Diagnosis not present

## 2023-01-31 LAB — POCT INR: INR: 2.5 (ref 2.0–3.0)

## 2023-01-31 NOTE — Patient Instructions (Signed)
Description   DO NOT HIGHLIGHT PAPERS & GIVE PRINTED PAPERWORK TO WIFE-THESE THINGS TRIGGER MIGRAINES. Continue taking 1 tablet daily EXCEPT 1/2 tablet on Mondays, Wednesdays, Fridays, and Saturdays.  Continue eating leafy veggies daily as normal.  Recheck INR in 6 weeks.  Anticoagulation Clinic [phone]

## 2023-02-02 ENCOUNTER — Other Ambulatory Visit: Payer: Self-pay | Admitting: Neurology

## 2023-02-04 ENCOUNTER — Encounter (HOSPITAL_COMMUNITY): Payer: Self-pay

## 2023-02-04 ENCOUNTER — Other Ambulatory Visit: Payer: Self-pay

## 2023-02-04 ENCOUNTER — Emergency Department (HOSPITAL_COMMUNITY)
Admission: EM | Admit: 2023-02-04 | Discharge: 2023-02-04 | Disposition: A | Discharge status: Home / Self Care | Payer: Medicare Other | Attending: Emergency Medicine | Admitting: Emergency Medicine

## 2023-02-04 ENCOUNTER — Ambulatory Visit: Payer: Medicare Other

## 2023-02-04 DIAGNOSIS — G43109 Migraine with aura, not intractable, without status migrainosus: Secondary | ICD-10-CM | POA: Diagnosis not present

## 2023-02-04 DIAGNOSIS — Z7901 Long term (current) use of anticoagulants: Secondary | ICD-10-CM | POA: Diagnosis not present

## 2023-02-04 DIAGNOSIS — J449 Chronic obstructive pulmonary disease, unspecified: Secondary | ICD-10-CM | POA: Insufficient documentation

## 2023-02-04 DIAGNOSIS — G43909 Migraine, unspecified, not intractable, without status migrainosus: Secondary | ICD-10-CM | POA: Diagnosis present

## 2023-02-04 LAB — BASIC METABOLIC PANEL
Anion gap: 8 (ref 5–15)
BUN: 15 mg/dL (ref 8–23)
CO2: 29 mmol/L (ref 22–32)
Calcium: 9.3 mg/dL (ref 8.9–10.3)
Chloride: 102 mmol/L (ref 98–111)
Creatinine, Ser: 0.89 mg/dL (ref 0.61–1.24)
GFR, Estimated: >60 mL/min (ref >60)
Glucose, Bld: 83 mg/dL (ref 70–99)
Potassium: 4 mmol/L (ref 3.5–5.1)
Sodium: 139 mmol/L (ref 135–145)

## 2023-02-04 LAB — CBC
HCT: 42.3 % (ref 39.0–52.0)
Hemoglobin: 13.8 g/dL (ref 13.0–17.0)
MCH: 31.5 pg (ref 26.0–34.0)
MCHC: 32.6 g/dL (ref 30.0–36.0)
MCV: 96.6 fL (ref 80.0–100.0)
Platelets: 188 10*3/uL (ref 150–400)
RBC: 4.38 MIL/uL (ref 4.22–5.81)
RDW: 13.2 % (ref 11.5–15.5)
WBC: 7 10*3/uL (ref 4.0–10.5)
nRBC: 0 % (ref 0.0–0.2)

## 2023-02-04 MED ORDER — METOCLOPRAMIDE HCL 5 MG/ML IJ SOLN
10.0000 mg | Freq: Once | INTRAMUSCULAR | Status: AC
  Administered 2023-02-04: 10 mg via INTRAVENOUS
  Filled 2023-02-04: qty 2

## 2023-02-04 NOTE — Discharge Instructions (Signed)
Please follow-up with your PCP for further management and care of your migraine.  Please return to the ED with any new or worsening symptoms.

## 2023-02-04 NOTE — ED Triage Notes (Signed)
Pt reports migraine for 8 days. No relief with at home medications. C/o nausea and light sensitivity.

## 2023-02-04 NOTE — ED Provider Notes (Signed)
[place] EMERGENCY DEPARTMENT AT [facility] Provider Note   CSN: [id number] Arrival date & time: 02/04/23  1319     History  Chief Complaint  Patient presents with   Migraine    Kyle Walters is a 67 y.o. male with medical history to include anxiety, COPD, enlarged prostate, GERD, IBS, migraines, MI.  Patient presents to ED for evaluation of migraine.  States that it is been ongoing for the last 8 days unrelieved by home medications.  States this is a typical migraine for him however length of time is typically not this long to control.  He reports that he has lots of severity and nausea.  He denies any neck stiffness, fevers, chest pain, shortness of breath, one-sided weakness or numbness.  Reports that typically comes and gets IV Reglan which resolves his migraine.   Migraine Associated symptoms include headaches.       Home Medications Prior to Admission medications   Medication Sig Start Date End Date Taking? Authorizing Provider  albuterol (VENTOLIN HFA) 108 (90 Base) MCG/ACT inhaler Inhale 2 puffs into the lungs every 6 (six) hours as needed for wheezing or shortness of breath. 10/23/22   Parrett, Virgel Bouquet, NP  ALPRAZolam (XANAX) 1 MG tablet Take 0.5 mg by mouth 5 (five) times daily as needed for anxiety.    [provider]  Atogepant (QULIPTA) 60 MG TABS Take 1 tablet by mouth daily.    [provider]  botulinum toxin Type A (BOTOX) 200 units injection PROVIDER TO INJECT 155 UNITS INTO MUSCLES OF HEAD AND NECK EVERY 3 MONTHS. DISCARD REMAINDER. 06/14/22   Anson Fret, MD  clopidogrel (PLAVIX) 75 MG tablet TAKE 1 TABLET BY MOUTH DAILY WITH BREAKFAST. 05/28/22   Corky Crafts, MD  doxazosin (CARDURA) 8 MG tablet Take 8 mg by mouth daily.    [provider]  EPINEPHrine 0.3 mg/0.3 mL IJ SOAJ injection Inject 0.3 mg into the muscle as needed. 10/07/22   [provider]  escitalopram (LEXAPRO) 20 MG tablet Take 10 mg by  mouth in the morning. 10/26/14   [provider]  Fluticasone-Umeclidin-Vilant (TRELEGY ELLIPTA) 100-62.5-25 MCG/ACT AEPB Inhale 1 puff into the lungs daily. 01/21/23   Leslye Peer, MD  gabapentin (NEURONTIN) 400 MG capsule Take 400 mg by mouth 4 (four) times daily.     [provider]  Galcanezumab-gnlm (EMGALITY) 120 MG/ML SOAJ Inject 120 mg into the skin every 30 (thirty) days. 02/22/22   Anson Fret, MD  memantine (NAMENDA) 10 MG tablet TAKE 2 TABLETS BY MOUTH IN THE MORNING AND 1 TABLET IN THE LATE AFTERNOON 11/06/22   Anson Fret, MD  Multiple Vitamin (MULTIVITAMIN WITH MINERALS) TABS tablet Take 1 tablet by mouth daily after breakfast. Centrum Silver for Men    [provider]  Nerve Stimulator (NERIVIO) DEVI USE WITHIN 1 HR OF MIGRAINE. SET A STRONG YET COMFORTABLE INTENSITY LEVEL & MAINTAIN LEVEL FOR 45 MINS 01/23/23   Anson Fret, MD  nitroGLYCERIN (NITROSTAT) 0.4 MG SL tablet Place 1 tablet (0.4 mg total) under the tongue every 5 (five) minutes as needed. 03/14/21   Arty Baumgartner, NP  Omega-3 Fatty Acids (FISH OIL) 1200 MG CAPS Take 1,200 mg by mouth daily after breakfast.    [provider]  rosuvastatin (CRESTOR) 20 MG tablet TAKE 1 TABLET BY MOUTH EVERY DAY 01/14/23   Corky Crafts, MD  silodosin (RAPAFLO) 8 MG CAPS capsule Take 8 mg by mouth  daily with breakfast.    [provider]  sucralfate (CARAFATE) 1 g tablet Take 1 tablet (1 g total) by mouth 4 (four) times daily as needed. 11/12/22   Sabas Sous, MD  Tiotropium Bromide-Olodaterol (STIOLTO RESPIMAT) 2.5-2.5 MCG/ACT AERS Inhale 2 puffs into the lungs daily. 01/29/23   Leslye Peer, MD  Ubrogepant (UBRELVY) 100 MG TABS TAKE 1 TABLET MOUTH AS NEEDED AS CLOSE TO MIGRAINE ONSET AS POSSIBLE. MAY REPEAT IN 2 HOURS IF NEEDED. MAX 200 MG PER 24 HOURS. Patient taking differently: Take 100 mg by mouth as directed. Take 1 tablet as close to migraine onset as possible.  May repeat in 2 hours if needed, max dose 200 mg /daily. 06/01/21   Anson Fret, MD  vitamin B-12 (CYANOCOBALAMIN) 500 MCG tablet Take 500 mcg by mouth daily.    [provider]  warfarin (COUMADIN) 5 MG tablet TAKE 1/2 TO 1 TABLET BY MOUTH ONCE DAILY AS DIRECTED BY ANTICOAGULATION CLINIC Patient taking differently: Take 2.5-5 mg by mouth as directed. Take 5 mg on (Sun, Tues, Thurs) & Take 1/2 tablet (2.5 mg) all other days as directed by Anticoagulation Clinic 07/16/22   Corky Crafts, MD      Allergies    Plasticized base [plastibase], Tetracycline, Triptans, Effexor [venlafaxine hcl], Prednisone, and Sulfamethoxazole-trimethoprim    Review of Systems   Review of Systems  Constitutional:  Negative for fever.  Eyes:  Positive for photophobia.  Gastrointestinal:  Positive for nausea. Negative for vomiting.  Neurological:  Positive for headaches.  All other systems reviewed and are negative.   Physical Exam Updated Vital Signs BP 117/81 (BP Location: Left Arm)   Pulse 66   Temp 97.7 F (36.5 C) (Oral)   Resp 17   Ht 5\' 6"  (1.676 m)   Wt 70.3 kg   SpO2 94%   BMI 25.02 kg/m  Physical Exam Vitals and nursing note reviewed.  Constitutional:      General: He is not in acute distress.    Appearance: Normal appearance. He is not ill-appearing, toxic-appearing or diaphoretic.  HENT:     Head: Normocephalic and atraumatic.     Nose: Nose normal.     Mouth/Throat:     Mouth: Mucous membranes are moist.     Pharynx: Oropharynx is clear.  Eyes:     Extraocular Movements: Extraocular movements intact.     Conjunctiva/sclera: Conjunctivae normal.     Pupils: Pupils are equal, round, and reactive to light.  Cardiovascular:     Rate and Rhythm: Normal rate and regular rhythm.  Pulmonary:     Effort: Pulmonary effort is normal.     Breath sounds: Normal breath sounds. No wheezing.  Abdominal:     General: Abdomen is flat. Bowel sounds are normal.     Palpations:  Abdomen is soft.     Tenderness: There is no abdominal tenderness.  Musculoskeletal:     Cervical back: Normal range of motion and neck supple. No tenderness.  Skin:    General: Skin is warm and dry.     Capillary Refill: Capillary refill takes less than 2 seconds.  Neurological:     General: No focal deficit present.     Mental Status: He is alert and oriented to person, place, and time.     GCS: GCS eye subscore is 4. GCS verbal subscore is 5. GCS motor subscore is 6.     Cranial Nerves: Cranial nerves 2-12 are intact. No cranial nerve deficit.  Sensory: Sensation is intact. No sensory deficit.     Motor: Motor function is intact. No weakness.     Coordination: Coordination is intact. Heel to [facility] Test normal.     Comments: No focal deficits on examination.     ED Results / Procedures / Treatments   Labs (all labs ordered are listed, but only abnormal results are displayed) Labs Reviewed  CBC  BASIC METABOLIC PANEL    EKG None  Radiology No results found.  Procedures Procedures   Medications Ordered in ED Medications  metoCLOPramide (REGLAN) injection 10 mg (10 mg Intravenous Given 02/04/23 1358)    ED Course/ Medical Decision Making/ A&P  Medical Decision Making Amount and/or Complexity of Data Reviewed Labs: ordered.  Risk Prescription drug management.   67 year old male with known migraines presents to ED for evaluation of migraine.  Please see HPI for further details.  On examination patient neurological examination is benign and at baseline.  No focal deficits noted.  Patient states that typically he gets Reglan but his migraines which resolves them.  I also offer the patient Benadryl due to the fact that Reglan could make him anxious but he states that he has BPH and he will not be able to urinate if he takes Reglan.  He denies any history of this ever occurring to him.  Lab collected on the patient include CBC, BMP.  Patient was given 10 mg of  Reglan.  He states that after Reglan was administered he became anxious and now he is requesting discharge.  I advised him that I would like to give him Benadryl on top of it to combat this but he states that if he does that he will have to be cathed.  I advised him that this is very unlikely however he is still requesting discharge.  Patient discharged at this time.   Final Clinical Impression(s) / ED Diagnoses Final diagnoses:  Migraine with aura and without status migrainosus, not intractable    Rx / DC Orders ED Discharge Orders     None         Al Decant, PA-C 02/04/23 1421    Terrilee Files, MD 02/04/23 1800

## 2023-02-05 ENCOUNTER — Encounter: Payer: Self-pay | Admitting: Emergency Medicine

## 2023-02-05 ENCOUNTER — Ambulatory Visit: Payer: Medicare Other | Attending: Cardiology

## 2023-02-05 DIAGNOSIS — I2510 Atherosclerotic heart disease of native coronary artery without angina pectoris: Secondary | ICD-10-CM

## 2023-02-05 DIAGNOSIS — E785 Hyperlipidemia, unspecified: Secondary | ICD-10-CM

## 2023-02-06 LAB — LIPID PANEL
Chol/HDL Ratio: 2 {ratio} (ref 0.0–5.0)
Cholesterol, Total: 116 mg/dL (ref 100–199)
HDL: 58 mg/dL (ref >39)
LDL Chol Calc (NIH): 48 mg/dL (ref 0–99)
Triglycerides: 36 mg/dL (ref 0–149)
VLDL Cholesterol Cal: 10 mg/dL (ref 5–40)

## 2023-02-07 ENCOUNTER — Ambulatory Visit (HOSPITAL_COMMUNITY): Payer: Medicare Other | Attending: Cardiology

## 2023-02-07 ENCOUNTER — Telehealth: Payer: Self-pay | Admitting: Emergency Medicine

## 2023-02-07 DIAGNOSIS — R072 Precordial pain: Secondary | ICD-10-CM | POA: Diagnosis not present

## 2023-02-07 DIAGNOSIS — I2510 Atherosclerotic heart disease of native coronary artery without angina pectoris: Secondary | ICD-10-CM

## 2023-02-07 LAB — ECHOCARDIOGRAM COMPLETE
Area-P 1/2: 3.06 cm2
S' Lateral: 2.6 cm

## 2023-02-07 MED ORDER — PERFLUTREN LIPID MICROSPHERE
1.0000 mL | INTRAVENOUS | Status: AC | PRN
Start: 2023-02-07 — End: 2023-02-07
  Administered 2023-02-07: 1 mL via INTRAVENOUS

## 2023-02-07 NOTE — Telephone Encounter (Signed)
Please try to set the patient up with an earlier office visit, before the end of the year, with RB or an APP.  Thank you

## 2023-02-07 NOTE — Telephone Encounter (Signed)
Needs Breo sent to [facility] on [address]

## 2023-02-08 MED ORDER — FLUTICASONE FUROATE-VILANTEROL 100-25 MCG/ACT IN AEPB: 1.0000 | INHALATION_SPRAY | Freq: Every day | RESPIRATORY_TRACT | 5 refills | Status: DC

## 2023-02-08 NOTE — Telephone Encounter (Signed)
Rx for Virgel Bouquet has been sent to preferred pharmacy for pt. Attempted to call pt but unable to reach. Left detailed message letting him know this was done.

## 2023-02-11 ENCOUNTER — Telehealth: Payer: Self-pay

## 2023-02-11 NOTE — Telephone Encounter (Signed)
-----   Message from Perlie Gold sent at 02/10/2023  6:14 PM EDT ----- Stable heart pump function with last echocardiogram, 50-55% ejection faction. There is abnormal movement of the bottom/apex portion of the left ventricle, but consistent/stable with prior echocardiogram. No evidence of LV thrombus. Trivial tricuspid valve regurgitation is newly noted. This is a common finding and not clinically significant.

## 2023-02-11 NOTE — Telephone Encounter (Signed)
Spoke with patient concerning recent echocardiogram results. Although abnormal movement is noted at the apex of the heart patient would like to know if warfarin was still needed? Patient states he has a strong family history of hemorrhagic stroke and would like to discontinue warfarin if at all possible. Will forward to Perlie Gold for further recommendation.

## 2023-02-12 NOTE — Telephone Encounter (Signed)
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 Given this, would recommend that we continue with Warfarin for now. There is some data to suggest that Eliquis can be safely used as an alternative and Eliquis generally has a reassuring safety profile. He was previously on this but Dr. Eldridge Dace switched to Warfarin back in 2022. Certainly chronic use of a blood thinner warrants ongoing discussion, would suggest that this be talked about when he sees Dr. Rosemary Holms in clinic in December.  Perlie Gold, PA-C   Called the pt to endorse the above response by Perlie Gold PA-C.   Pt states he is aware that his apex is still akinetic but wanted to ask Perlie Gold PA-C if "the blood is still swirling" as previously noted on his prior echo read by Dr. Eldridge Dace back on 08/06/21.  Pt states he didn't see any mention of this on this current echo report, like his previous echo report back in 07/2021 did.  Pt states he is aware he should continue his warfarin for high risk reoccurrence of LV thrombus, but wanted to ask if this echo showed swirling or not.  Pt is aware we will resend this message back to Perlie Gold PA-C for clarification on this matter, and a triage nurse will follow-up with him accordingly thereafter.   Pt verbalized understanding and agrees with this plan.

## 2023-02-13 NOTE — Telephone Encounter (Signed)
Patient is returning call.  °

## 2023-02-13 NOTE — Telephone Encounter (Signed)
Will discuss during upcoming office visit on 12/3.  Thanks MJP

## 2023-02-13 NOTE — Telephone Encounter (Signed)
Left a message for the pt to call back.  

## 2023-02-13 NOTE — Telephone Encounter (Signed)
Pt called and given Evans review and verbalized understanding.

## 2023-02-21 ENCOUNTER — Ambulatory Visit: Payer: Medicare Other | Admitting: Emergency Medicine

## 2023-02-21 ENCOUNTER — Encounter: Payer: Self-pay | Admitting: Emergency Medicine

## 2023-02-21 VITALS — BP 126/62 | HR 62 | Temp 98.4°F | Ht 66.0 in | Wt 157.2 lb

## 2023-02-21 DIAGNOSIS — J449 Chronic obstructive pulmonary disease, unspecified: Secondary | ICD-10-CM | POA: Diagnosis not present

## 2023-02-21 DIAGNOSIS — G4733 Obstructive sleep apnea (adult) (pediatric): Secondary | ICD-10-CM | POA: Diagnosis not present

## 2023-02-21 DIAGNOSIS — Z87891 Personal history of nicotine dependence: Secondary | ICD-10-CM | POA: Diagnosis not present

## 2023-02-21 DIAGNOSIS — Z23 Encounter for immunization: Secondary | ICD-10-CM

## 2023-02-21 NOTE — Progress Notes (Signed)
Subjective:    Patient ID: Kyle Walters, male    DOB: Mar 20, 1956, 67   HPI ROV 01/01/2023 --Kyle Walters is a 67 with a history of former tobacco and asthmatic COPD, superimposed restrictive lung disease.  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  Participates in lung cancer screening program and his CT chest has shown small calcified granulomas, otherwise reassuring.  He has been managed on Breo. At our last visit we discussed trying Breo every other day.  He was seen in the emergency department 6/18 for increased daytime sleepiness and fatigue.  There was some concern for possible OSA.  He had a VBG that showed a pCO2 of 77.  He is on Neurontin, Xanax 3 times daily.  He did not desaturate on ambulation in our office 10/23/2022.  Split-night sleep study was arranged.  He underwent pulmonary function testing today.  He reports that he is no longer feeling hypersomnolent. His breathing benefited from getting back on the [place].   Sleep study 11/14/2022 showed an AHI of 0/h with oral appliance/mouthguard in place.  There was some soft snoring.  Pulmonary function testing performed today and reviewed by me, show moderately severe obstruction without a significant bronchodilator response.  His lung volumes show evidence of hyperinflation based on his RV, diffusion capacity is decreased but corrects to the normal range when adjusted for alveolar volume.  FEV1 is 1.72 L (60% predicted) compared with 65-69% predicted 11/10/2014.  ROV 02/21/2023 --follow-up visit for 67 year old male with history of asthmatic COPD, former tobacco use and some superimposed restrictive lung disease.  He prospective lung cancer screening program and has small calcified granulomas with otherwise reassuring imaging.  He has moderately severe obstruction on his pulmonary function testing done  01/01/2023.  He has had some hypersomnolence but a reassuring PSG 10/2022.  Based on his PFT last time I did a trial of Trelegy.  He was interested in getting rid of the ICS component (to avoid any impact this might have on his anxiety/depression) so we then changed him to [employer]. He did notice some improvement in his functional capacity, but he also had more HA/migraines, anxiety, some muscle tension.      Objective:   Physical Exam Vitals:   02/21/23 1551  BP: 126/62  Pulse: 62  Temp: 98.4 F (36.9 C)  TempSrc: Oral  SpO2: 96%  Weight: 157 lb 3.2 oz (71.3 kg)  Height: 5\' 6"  (1.676 m)    Gen: Pleasant, well-nourished, in no distress,  normal affect  ENT: No lesions,  mouth clear,  oropharynx clear, no postnasal drip  Neck: No JVD, no stridor  Lungs: No use of accessory muscles, clear without rales or rhonchi  Cardiovascular: RRR, heart sounds normal, no murmur or gallops, no peripheral edema  Musculoskeletal: No deformities, no cyanosis or clubbing  Neuro: awake, appropriate, non-focal  Skin: Warm, no lesions or rashes     Assessment & Plan:  COPD (chronic obstructive pulmonary disease) I tried changing his Breo to Trelegy and then to Stiolto to see if he would get more benefit.  Unfortunately he has had side effects including worsening of his migraines.  I suspect that the addition of LAMA was the culprit here, less likely the ICS.  He was tolerating Breo before and I think we can change back to this.  He agrees with the plan.  OSA (obstructive sleep apnea) Reassuring PSG in July 2024.  No evidence to support initiation of CPAP  History of tobacco use Due for repeat lung cancer screening CT chest in June 2025     Levy Pupa, MD, PhD [facility] [phone] or if no answer [phone]

## 2023-02-21 NOTE — Assessment & Plan Note (Signed)
Due for repeat lung cancer screening CT chest in June 2025

## 2023-02-21 NOTE — Assessment & Plan Note (Signed)
I tried changing his Breo to Trelegy and then to Stiolto to see if he would get more benefit.  Unfortunately he has had side effects including worsening of his migraines.  I suspect that the addition of LAMA was the culprit here, less likely the ICS.  He was tolerating Breo before and I think we can change back to this.  He agrees with the plan.

## 2023-02-21 NOTE — Assessment & Plan Note (Signed)
Reassuring PSG in July 2024.  No evidence to support initiation of CPAP

## 2023-02-21 NOTE — Addendum Note (Signed)
Addended by: Gay Filler T on: 02/21/2023 04:40 PM   Modules accepted: Orders

## 2023-02-21 NOTE — Patient Instructions (Addendum)
We will stop Stiolto and go back to your Breo 1 inhalation once daily.  Rinse and gargle after using. Keep your albuterol available to use 2 puffs if you needed for shortness of breath, chest tightness, wheezing. Flu shot today You will be due for your lung cancer screening CT chest in June 2025 Follow in 6 months, sooner if you have any problems.

## 2023-02-25 ENCOUNTER — Ambulatory Visit: Payer: Medicare Other | Admitting: Interventional Cardiology

## 2023-02-26 ENCOUNTER — Ambulatory Visit: Payer: Medicare Other | Admitting: Physician Assistant

## 2023-02-28 ENCOUNTER — Ambulatory Visit: Payer: Medicare Other | Admitting: Adult Health

## 2023-02-28 ENCOUNTER — Encounter: Payer: Self-pay | Admitting: *Deleted

## 2023-02-28 NOTE — Telephone Encounter (Signed)
Pt called to let us know he agreed to cancel appt with [facility] for today and combine this visit with appt scheduled on 04/29/23

## 2023-03-07 DIAGNOSIS — M1711 Unilateral primary osteoarthritis, right knee: Secondary | ICD-10-CM | POA: Diagnosis not present

## 2023-03-07 DIAGNOSIS — M25561 Pain in right knee: Secondary | ICD-10-CM | POA: Diagnosis not present

## 2023-03-14 ENCOUNTER — Ambulatory Visit: Payer: Medicare Other | Attending: Internal Medicine

## 2023-03-14 DIAGNOSIS — Z5181 Encounter for therapeutic drug level monitoring: Secondary | ICD-10-CM | POA: Diagnosis not present

## 2023-03-14 DIAGNOSIS — I513 Intracardiac thrombosis, not elsewhere classified: Secondary | ICD-10-CM

## 2023-03-14 LAB — POCT INR: INR: 3 (ref 2.0–3.0)

## 2023-03-14 NOTE — Patient Instructions (Signed)
Description   DO NOT HIGHLIGHT PAPERS & GIVE PRINTED PAPERWORK TO WIFE-THESE THINGS TRIGGER MIGRAINES. Continue taking 1 tablet daily EXCEPT 1/2 tablet on Mondays, Wednesdays, Fridays, and Saturdays.  Continue eating leafy veggies daily as normal.  Recheck INR in 6 weeks.  Anticoagulation Clinic [phone]

## 2023-03-19 ENCOUNTER — Encounter (HOSPITAL_COMMUNITY): Payer: Medicare Other

## 2023-03-26 ENCOUNTER — Ambulatory Visit: Payer: Medicare Other | Attending: Cardiology | Admitting: Cardiology

## 2023-03-26 ENCOUNTER — Encounter: Payer: Self-pay | Admitting: Cardiology

## 2023-03-26 ENCOUNTER — Encounter: Payer: Self-pay | Admitting: *Deleted

## 2023-03-26 VITALS — BP 118/69 | HR 68 | Resp 16 | Ht 66.0 in | Wt 156.0 lb

## 2023-03-26 DIAGNOSIS — I25118 Atherosclerotic heart disease of native coronary artery with other forms of angina pectoris: Secondary | ICD-10-CM

## 2023-03-26 DIAGNOSIS — R931 Abnormal findings on diagnostic imaging of heart and coronary circulation: Secondary | ICD-10-CM

## 2023-03-26 DIAGNOSIS — R072 Precordial pain: Secondary | ICD-10-CM | POA: Diagnosis not present

## 2023-03-26 DIAGNOSIS — Z955 Presence of coronary angioplasty implant and graft: Secondary | ICD-10-CM | POA: Insufficient documentation

## 2023-03-26 DIAGNOSIS — I739 Peripheral vascular disease, unspecified: Secondary | ICD-10-CM

## 2023-03-26 NOTE — Progress Notes (Signed)
Cardiology Office Note:  .   Date:  03/26/2023  ID:  Jonathon Resides, DOB 09/24/1955, MRN [id number] PCP: Rometta Emery, MD  [facility] Providers Cardiologist:  Truett Mainland, MD PCP: Rometta Emery, MD  Chief Complaint  Patient presents with   Chest Pain      History of Present Illness: .    Kj Coenen is a 67 y.o. male with hypertension, CAD, h/o LV thrombus  Patient previously saw Dr. Eldridge Dace, recently seen by Perlie Gold, PA-C.  Patient was diagnosed with LV thrombus incidentally on CT scan, that led to coronary angiogram which showed occluded mid LAD.  He underwent LAD stent placement.  MRI performed on the same time show infarcted anterior wall, akinetic apex with presence of LV thrombus.  He was treated with warfarin at that time.  It appears that he has stayed on warfarin for >2 years now.  Most recent echocardiogram with definitely contrast showed preserved LVEF with akinetic apex, swelling of definitive contrast without evidence of LV thrombus.  Separately, patient reports pain in his shoulder and upper back area with exertion, that seems to improve with rest.  He did not have clear chest pain symptoms even when he had occluded LAD in 2022.  Separately, he reports cramping pain in both his calves when he performs stationary bicycle exercise.  Vitals:   03/26/23 1538  BP: 118/69  Pulse: 68  Resp: 16  SpO2: 96%     ROS:  Review of Systems  Cardiovascular:  Positive for chest pain (Angina equivalent symptoms with exertional back.shoulder pain) and claudication. Negative for dyspnea on exertion, leg swelling, palpitations and syncope.     Studies Reviewed: Marland Kitchen        EKG 03/26/2023: Normal sinus rhythm Anterolateral infarct (cited on or before 22-Jan-2023) When compared with ECG of 22-Jan-2023 02:15, No significant change since   Independently interpreted Labs 02/05/2023: Chol 116, TG 36, HDL 58, LDL 48 Hb 13.8  Independently  interpreted Echocardiogram 02/07/2023: 1. Left ventricular ejection fraction, by estimation, is 50 to 55%. The  left ventricle has low normal function. The left ventricle demonstrates  regional wall motion abnormalities with akinetic apex. No LV thrombus.  Left ventricular diastolic parameters are consistent with Grade I diastolic dysfunction (impaired relaxation).  Mild left atrial dilatation No significant valve abnormality  Coronary angiogram 03/14/2021:  Mid LAD lesion is 100% stenosed.  Left to left collaterals.  The apex was nonviable by cardiac MRI.   RPAV lesion is 100% stenosed.  Left to right collaterals.   Prox RCA lesion is 90% stenosed.   A drug-eluting stent was successfully placed using a STENT ONYX FRONTIER 3.0X34, postdilated to greater than 3.75 mm.   Post intervention, there is a 0% residual stenosis.   Mid RCA lesion is 75% stenosed.   A drug-eluting stent was successfully placed using a STENT ONYX FRONTIER 2.75X22.   Post intervention, there is a 0% residual stenosis.   LV end diastolic pressure is normal.   There is no aortic valve stenosis.   Continue dual antiplatelet therapy with aspirin and Plavix.  He has completed 3 months of anticoagulation with Eliquis for his LV thrombus.  Continue aggressive secondary prevention including high-dose statin.  Prevacid will have to be changed to Protonix.   Cardiac MRI 03/02/2021: 1. Normal LV size with EF 51%. Apical aneurysm with layered LV thrombus present.  2.  Normal RV size and systolic function, EF 53%. 3. Near transmural LGE involving the true apex  and the peri-apical segments. This area is unlikely to improve with revasculariziation (not viable). The remainder of the LV has minimal LGE and appears viable.      Physical Exam:   Physical Exam Vitals and nursing note reviewed.  Constitutional:      General: He is not in acute distress. Neck:     Vascular: No JVD.  Cardiovascular:     Rate and Rhythm: Normal  rate and regular rhythm.     Pulses:          Dorsalis pedis pulses are 2+ on the right side and 2+ on the left side.       Posterior tibial pulses are 1+ on the right side and 1+ on the left side.     Heart sounds: Normal heart sounds. No murmur heard. Pulmonary:     Effort: Pulmonary effort is normal.     Breath sounds: Normal breath sounds. No wheezing or rales.  Musculoskeletal:     Right lower leg: No edema.     Left lower leg: No edema.      VISIT DIAGNOSES:   ICD-10-CM   1. Precordial pain  R07.2 EKG 12-Lead    MYOCARDIAL PERFUSION IMAGING    ECHOCARDIOGRAM LIMITED    2. Coronary artery disease of native artery of native heart with stable angina pectoris (HCC)  I25.118 MYOCARDIAL PERFUSION IMAGING    ECHOCARDIOGRAM LIMITED    Cardiac Stress Test: Informed Consent Details: Physician/Practitioner Attestation; Transcribe to consent form and obtain patient signature    3. Abnormal echocardiogram  R93.1     4. S/P coronary artery stent placement  Z95.5     5. Claudication (HCC)  I73.9 VAS [place] LOWER EXTREMITY ARTERIAL DUPLEX    VAS [place] ABI WITH/WO TBI       ASSESSMENT AND PLAN: .    Kahlo Klecha is a 67 y.o. male with hypertension, CAD, h/o LV thrombus  CAD: Recent exertional shoulder/back pain symptoms could be angina equivalent. PET CT stress test was performed.  However, patient wishes not to undergo medication induced/pharmacological stress test. He is agreeable to performing exercise nuclear stress test. Continue Plavix monotherapy, which she has tolerated well, along with statin.  Lipids well-controlled.  H/o LV thrombus: Noted in 2022, patient has been on warfarin since then. Recent echocardiogram with definitive contrast does not show LV thrombus, shows only swirling of contrast. I do not think there is a definite indication for anticoagulant at this time. I discussed this with patient.  Patient himself is afraid of continuing warfarin given family history of  hemorrhagic stroke. We mutually agreed to stop warfarin. Will repeat limited echocardiogram difficulty contrast to reevaluate for any LV thrombus.  Claudication: Resting vascular exam is normal.  However, patient has respecters for PAD, as well as arterial calcification incidentally noted on knee x-ray. Recommend lower extremity arterial duplex.  I personally reviewed and independently interpreted serial echocardiograms, cardiac MRI, as well as coronary angiogram since 2022. I spent 25 minutes the day of her visit reviewing her records including previous charts and labs, 15 minutes talking with her about her history and in face-to-face counseling and 5 minutes in adjusting medication for a total of 45 minutes spent in patient care.   Informed Consent   Shared Decision Making/Informed Consent The risks [chest pain, shortness of breath, cardiac arrhythmias, dizziness, blood pressure fluctuations, myocardial infarction, stroke/transient ischemic attack, nausea, vomiting, allergic reaction, radiation exposure, metallic taste sensation and life-threatening complications (estimated to be 1 in 10,000)], benefits (  risk stratification, diagnosing coronary artery disease, treatment guidance) and alternatives of a nuclear stress test were discussed in detail with Mr. Groves and he agrees to proceed.       No orders of the defined types were placed in this encounter.    F/u in 3 months  Signed, Elder Negus, MD

## 2023-03-26 NOTE — Patient Instructions (Signed)
Medication Instructions:   STOP TAKING COUMADIN NOW  *If you need a refill on your cardiac medications before your next appointment, please call your pharmacy*    Testing/Procedures:  Your physician has requested that you have en exercise stress myoview. For further information please visit [email or web address] Please follow instruction sheet, as given.   Your physician has requested that you have a lower  extremity arterial duplex. This test is an ultrasound of the arteries in the legs or arms. It looks at arterial blood flow in the legs and arms. Allow one hour for Lower and Upper Arterial scans. There are no restrictions or special instructions.  Please note: We ask at that you not bring children with you during ultrasound (echo/ vascular) testing. Due to room size and safety concerns, children are not allowed in the ultrasound rooms during exams. Our front office staff cannot provide observation of children in our lobby area while testing is being conducted. An adult accompanying a patient to their appointment will only be allowed in the ultrasound room at the discretion of the ultrasound technician under special circumstances. We apologize for any inconvenience.   Your physician has requested that you have an LIMITED echocardiogram. Echocardiography is a painless test that uses sound waves to create images of your heart. It provides your doctor with information about the size and shape of your heart and how well your heart's chambers and valves are working. This procedure takes approximately one hour. There are no restrictions for this procedure.  LIMITED ECHO TO BE DONE IN 2 MONTHS PER DR. PATWARDHAN   Please do NOT wear cologne, perfume, aftershave, or lotions (deodorant is allowed). Please arrive 15 minutes prior to your appointment time.  Please note: We ask at that you not bring children with you during ultrasound (echo/ vascular) testing. Due to room size and safety concerns,  children are not allowed in the ultrasound rooms during exams. Our front office staff cannot provide observation of children in our lobby area while testing is being conducted. An adult accompanying a patient to their appointment will only be allowed in the ultrasound room at the discretion of the ultrasound technician under special circumstances. We apologize for any inconvenience.    Follow-Up:  3 MONTHS WITH DR. PATWARDHAN

## 2023-03-27 ENCOUNTER — Ambulatory Visit: Payer: Medicare Other | Admitting: Cardiology

## 2023-03-27 ENCOUNTER — Telehealth: Payer: Self-pay | Admitting: *Deleted

## 2023-03-27 NOTE — Telephone Encounter (Signed)
-----   Message from Tobey Bride sent at 03/27/2023  8:27 AM EST ----- Regarding: RE: exercise myoview He came to check out and had 1000 questions.  He requested someone from nuclear call him first. ----- Message ----- From: Loa Socks, LPN Sent: 16/04/958   5:44 PM EST To: Gerome Sam; Silas Sacramento; Minette Brine; # Subject: exercise myoview                               Pt saw Dr. Rosemary Holms today in clinic and take to checkout to schedule several test  He was ordered to have an exercise myoview done and it never got scheduled.  Can you please call him and schedule this?  The instructions were pended in letters and reviewed with him in the clinic.  Thanks, [employer]

## 2023-03-28 ENCOUNTER — Telehealth (HOSPITAL_COMMUNITY): Payer: Self-pay

## 2023-03-28 NOTE — Telephone Encounter (Signed)
Spoke with the patient's wife, per DPR. She stated that she would give him the instructions. He will be here for his test. Asked to call back with any questions. S.Novie Maggio CCT

## 2023-04-01 ENCOUNTER — Encounter (HOSPITAL_COMMUNITY): Payer: Self-pay | Admitting: *Deleted

## 2023-04-01 ENCOUNTER — Ambulatory Visit (HOSPITAL_COMMUNITY): Payer: Medicare Other | Attending: Cardiology

## 2023-04-01 DIAGNOSIS — R072 Precordial pain: Secondary | ICD-10-CM | POA: Diagnosis not present

## 2023-04-01 DIAGNOSIS — I25118 Atherosclerotic heart disease of native coronary artery with other forms of angina pectoris: Secondary | ICD-10-CM | POA: Diagnosis not present

## 2023-04-01 MED ORDER — TECHNETIUM TC 99M TETROFOSMIN IV KIT
10.6000 | PACK | Freq: Once | INTRAVENOUS | Status: AC | PRN
  Administered 2023-04-01: 10.6 via INTRAVENOUS

## 2023-04-04 ENCOUNTER — Other Ambulatory Visit: Payer: Self-pay | Admitting: Cardiology

## 2023-04-04 ENCOUNTER — Ambulatory Visit (HOSPITAL_COMMUNITY): Payer: Medicare Other | Attending: Cardiology

## 2023-04-04 DIAGNOSIS — I25118 Atherosclerotic heart disease of native coronary artery with other forms of angina pectoris: Secondary | ICD-10-CM

## 2023-04-04 DIAGNOSIS — R072 Precordial pain: Secondary | ICD-10-CM

## 2023-04-04 LAB — MYOCARDIAL PERFUSION IMAGING
Rest Nuclear Isotope Dose: 10.6 mCi
Stress Nuclear Isotope Dose: 31.7 mCi

## 2023-04-04 MED ORDER — TECHNETIUM TC 99M TETROFOSMIN IV KIT
31.7000 | PACK | Freq: Once | INTRAVENOUS | Status: AC | PRN
  Administered 2023-04-04: 31.7 via INTRAVENOUS

## 2023-04-05 LAB — MYOCARDIAL PERFUSION IMAGING
Angina Index: 0
Duke Treadmill Score: 7
Estimated workload: 8.4
Exercise duration (min): 7 min
Exercise duration (sec): 17 s
LV dias vol: 118 mL (ref 62–150)
LV sys vol: 70 mL
MPHR: 152 {beats}/min
Nuc Stress EF: 41 %
Peak HR: 133 {beats}/min
Percent HR: 87 %
Rest HR: 59 {beats}/min
Rest Nuclear Isotope Dose: 10.6 mCi
SDS: 5
SRS: 31
SSS: 40
ST Depression (mm): 0 mm
Stress Nuclear Isotope Dose: 31.7 mCi
TID: 0.98

## 2023-04-07 ENCOUNTER — Encounter: Payer: Self-pay | Admitting: Cardiology

## 2023-04-11 ENCOUNTER — Ambulatory Visit: Payer: Medicare Other | Admitting: Adult Health

## 2023-04-12 ENCOUNTER — Ambulatory Visit (HOSPITAL_BASED_OUTPATIENT_CLINIC_OR_DEPARTMENT_OTHER)
Admission: RE | Admit: 2023-04-12 | Discharge: 2023-04-12 | Disposition: A | Discharge status: Home / Self Care | Payer: Medicare Other | Source: Ambulatory Visit | Attending: Cardiovascular Disease | Admitting: Cardiovascular Disease

## 2023-04-12 ENCOUNTER — Ambulatory Visit (HOSPITAL_COMMUNITY)
Admission: RE | Admit: 2023-04-12 | Discharge: 2023-04-12 | Disposition: A | Discharge status: Home / Self Care | Payer: Medicare Other | Source: Ambulatory Visit | Attending: Cardiology | Admitting: Cardiology

## 2023-04-12 DIAGNOSIS — I739 Peripheral vascular disease, unspecified: Secondary | ICD-10-CM

## 2023-04-12 NOTE — Progress Notes (Signed)
Normal Lower Extremity Arterial Duplex normal circulation with normal ABI and normal waveform at both legs

## 2023-04-14 LAB — VAS US ABI WITH/WO TBI
Left ABI: 1.1
Right ABI: 1.16

## 2023-04-20 ENCOUNTER — Other Ambulatory Visit: Payer: Self-pay | Admitting: Interventional Cardiology

## 2023-04-22 ENCOUNTER — Telehealth: Payer: Self-pay | Admitting: Neurology

## 2023-04-22 NOTE — Telephone Encounter (Signed)
Delivery scheduled for 04/29/23.

## 2023-04-22 NOTE — Telephone Encounter (Signed)
Kyle Walters With [facility] calling to schedule delivery of botox: Single Vial, 200 unit powder, 1 package 90 day supply. [phone]

## 2023-04-28 NOTE — Progress Notes (Signed)
 04/28/23: Last month only had 3 migraines but in October had 14. Denies any new medical issue other than cardiology stopped warfarin which was initially started for blood clot in aorta (2.5 yeas ago).  Uses ubrevly. Triptans conintradicated due to previous history MI.  Reports that he did not have an increase in headaches since we delayed Botox  for 16 weeks instead of the typical 12 weeks.  He would like to continue pushing Botox  out to see if he can eventually stop the injections.  Next follow-up will be in 20 weeks.   01/07/23: reports that he only had 2 migraines last month.  He would like to space out his Botox  to see if he still needs it.  We will do his next Botox  injections in 16 weeks.  If he has not had any breakthrough headaches may consider pushing it out further  10/15/22: Botox  works well. Has gone over two weeks recently without a migraine. However today he does have a migraine. Wants to proceed with injections today.   07/04/22: Botox  continues to work well for him.  He states that last month he had more headaches than typical.    1213/23: Saw dr. Ines in November to discussed meds. [facility]erral to academic center was mentioned.  Reports starting Emgality  has helped  01/10/2022: Had a migraine yesterday. Saw Dr. Ines in June to discuss Medication.   10/10/2021:  Had more headaches in may. Otherwise doing ok.   BOTOX  PROCEDURE NOTE FOR MIGRAINE HEADACHE    Contraindications and precautions discussed with patient(above). Aseptic procedure was observed and patient tolerated procedure. Procedure performed by Duwaine Russell, NP  The condition has existed for more than 6 months, and pt does not have a diagnosis of ALS, Myasthenia Gravis or Lambert-Eaton Syndrome.  Risks and benefits of injections discussed and pt agrees to proceed with the procedure.  Written consent obtained  These injections are medically necessary.  These injections do not cause sedations or hallucinations which the  oral therapies may cause.  Indication/Diagnosis: chronic migraine BOTOX (G9414) injection was performed according to protocol by [employer]. 200 units of BOTOX  was dissolved into 4 cc NS.   NDC: [address]-98  Botox - 200 units x 1 vial Lot: [id number] Expiration: 06/2025 NDC: 9976-6078-97   Bacteriostatic 0.9% Sodium Chloride - * mL  Lot: [id number] Expiration: 02/22/2024 NDC: 9590-8033-97   Dx: H56.288        Description of procedure:  The patient was placed in a sitting position. The standard protocol was used for Botox  as follows, with 5 units of Botox  injected at each site:   -Procerus muscle, midline injection  -Corrugator muscle, bilateral injection  -Frontalis muscle, bilateral injection, with 2 sites each side, medial injection was performed in the upper one third of the frontalis muscle, in the region vertical from the medial inferior edge of the superior orbital rim. The lateral injection was again in the upper one third of the forehead vertically above the lateral limbus of the cornea, 1.5 cm lateral to the medial injection site.  -Temporalis muscle injection, 4 sites, bilaterally. The first injection was 3 cm above the tragus of the ear, second injection site was 1.5 cm to 3 cm up from the first injection site in line with the tragus of the ear. The third injection site was 1.5-3 cm forward between the first 2 injection sites. The fourth injection site was 1.5 cm posterior to the second injection site.  -Occipitalis muscle injection, 3 sites, bilaterally. The first injection was done one half  way between the occipital protuberance and the tip of the mastoid process behind the ear. The second injection site was done lateral and superior to the first, 1 fingerbreadth from the first injection. The third injection site was 1 fingerbreadth superiorly and medially from the first injection site.  -Cervical paraspinal muscle injection, 2 sites, bilateral knee first injection site was 1  cm from the midline of the cervical spine, 3 cm inferior to the lower border of the occipital protuberance. The second injection site was 1.5 cm superiorly and laterally to the first injection site.  -Trapezius muscle injection was performed at 3 sites, bilaterally. The first injection site was in the upper trapezius muscle halfway between the inflection point of the neck, and the acromion. The second injection site was one half way between the acromion and the first injection site. The third injection was done between the first injection site and the inflection point of the neck.   Will return for repeat injection in 3 months.   A 200 units of Botox  was used, 155 units were injected, the rest of the Botox  was wasted. The patient tolerated the procedure well, there were no complications of the above procedure.  Duwaine Russell, MSN, NP-C 04/28/2023, 6:44 PM [facility] [address] [place], [place] [address] [phone]

## 2023-04-29 ENCOUNTER — Ambulatory Visit: Payer: Medicare Other | Admitting: Adult Health

## 2023-04-29 DIAGNOSIS — G43711 Chronic migraine without aura, intractable, with status migrainosus: Secondary | ICD-10-CM

## 2023-04-29 MED ORDER — ONABOTULINUMTOXINA 200 UNITS IJ SOLR
155.0000 [IU] | Freq: Once | INTRAMUSCULAR | Status: AC
Start: 2023-04-29 — End: 2023-04-29
  Administered 2023-04-29: 155 [IU] via INTRAMUSCULAR

## 2023-04-29 NOTE — Progress Notes (Signed)
 Botox- 200 units x 1 vial Lot: [id number] Expiration: 06/2025 NDC: 4540-9811-91  Bacteriostatic 0.9% Sodium Chloride- * mL  Lot: [id number] Expiration: 02/22/2024 NDC: 6213-0865-78  Dx: I69.629 S/P Witnessed by Leeann Must, RN

## 2023-05-02 DIAGNOSIS — Z13220 Encounter for screening for lipoid disorders: Secondary | ICD-10-CM | POA: Diagnosis not present

## 2023-05-02 DIAGNOSIS — Z131 Encounter for screening for diabetes mellitus: Secondary | ICD-10-CM | POA: Diagnosis not present

## 2023-05-14 ENCOUNTER — Encounter: Payer: Self-pay | Admitting: *Deleted

## 2023-05-14 ENCOUNTER — Encounter: Payer: Self-pay | Admitting: Adult Health

## 2023-05-14 NOTE — Telephone Encounter (Signed)
Letter printed and ready for signature.

## 2023-05-14 NOTE — Telephone Encounter (Signed)
Letter written and sent to pt via mychart

## 2023-05-15 NOTE — Telephone Encounter (Signed)
Per Shannan Harper NP was able to sign and pt's wife picked up the letter.

## 2023-05-17 ENCOUNTER — Other Ambulatory Visit: Payer: Self-pay | Admitting: *Deleted

## 2023-05-17 DIAGNOSIS — Z87891 Personal history of nicotine dependence: Secondary | ICD-10-CM

## 2023-05-17 DIAGNOSIS — Z122 Encounter for screening for malignant neoplasm of respiratory organs: Secondary | ICD-10-CM

## 2023-06-05 ENCOUNTER — Ambulatory Visit (HOSPITAL_COMMUNITY): Payer: Medicare Other | Attending: Cardiology

## 2023-06-05 DIAGNOSIS — R072 Precordial pain: Secondary | ICD-10-CM | POA: Insufficient documentation

## 2023-06-05 DIAGNOSIS — I25118 Atherosclerotic heart disease of native coronary artery with other forms of angina pectoris: Secondary | ICD-10-CM | POA: Diagnosis not present

## 2023-06-05 LAB — ECHOCARDIOGRAM LIMITED
Area-P 1/2: 3.31 cm2
Calc EF: 56.6 %
S' Lateral: 3.14 cm
Single Plane A2C EF: 55.4 %
Single Plane A4C EF: 57.2 %

## 2023-06-06 ENCOUNTER — Telehealth: Payer: Self-pay | Admitting: Adult Health

## 2023-06-06 NOTE — Telephone Encounter (Signed)
LVM and sent mychart msg informing pt of appt change on 10/02/23 due to NP schedule change

## 2023-06-06 NOTE — Progress Notes (Signed)
Evidence of old injury to tip of the heart.  Contrast could not be used for the echocardiogram, which would have further confidence on ruling out clot in the heart.  However, based on this echocardiogram, there is no definite evidence of clot.  We should consider cardiac MRI for definitive evaluation of known laminated thrombus or not.  Thanks MJP

## 2023-06-07 ENCOUNTER — Telehealth: Payer: Self-pay | Admitting: Cardiology

## 2023-06-07 ENCOUNTER — Other Ambulatory Visit: Payer: Self-pay

## 2023-06-07 DIAGNOSIS — I513 Intracardiac thrombosis, not elsewhere classified: Secondary | ICD-10-CM

## 2023-06-07 NOTE — Telephone Encounter (Signed)
Spoke with pt and went over result note and recommendations. Pt agreed to moving forward with scheduling an MRI. Explained to pt that we will send cardiac MRI instructions through MyChart and he verbalized understanding.

## 2023-06-07 NOTE — Telephone Encounter (Signed)
Pt returning nurses call from yesterday regarding results. Please advise

## 2023-06-09 ENCOUNTER — Other Ambulatory Visit: Payer: Self-pay | Admitting: Interventional Cardiology

## 2023-06-10 DIAGNOSIS — M25561 Pain in right knee: Secondary | ICD-10-CM | POA: Diagnosis not present

## 2023-06-11 DIAGNOSIS — M1711 Unilateral primary osteoarthritis, right knee: Secondary | ICD-10-CM | POA: Diagnosis not present

## 2023-06-24 ENCOUNTER — Encounter (HOSPITAL_COMMUNITY): Payer: Self-pay

## 2023-06-24 ENCOUNTER — Emergency Department (HOSPITAL_COMMUNITY)
Admission: EM | Admit: 2023-06-24 | Discharge: 2023-06-24 | Discharge status: Against Medical Advice | Attending: Emergency Medicine | Admitting: Emergency Medicine

## 2023-06-24 ENCOUNTER — Other Ambulatory Visit: Payer: Self-pay

## 2023-06-24 ENCOUNTER — Other Ambulatory Visit: Payer: Self-pay | Admitting: Neurology

## 2023-06-24 DIAGNOSIS — Z5321 Procedure and treatment not carried out due to patient leaving prior to being seen by health care provider: Secondary | ICD-10-CM | POA: Diagnosis not present

## 2023-06-24 DIAGNOSIS — G43909 Migraine, unspecified, not intractable, without status migrainosus: Secondary | ICD-10-CM | POA: Diagnosis not present

## 2023-06-24 DIAGNOSIS — R519 Headache, unspecified: Secondary | ICD-10-CM | POA: Diagnosis present

## 2023-06-24 NOTE — ED Provider Triage Note (Signed)
 Emergency Medicine Provider Triage Evaluation Note  Kyle Walters , a 68 y.o. male  was evaluated in triage.  Pt complains of headache.  Started about a week ago.  States it is very similar to migraines had the past.  States he is come here many times for headache cocktail which usually takes his symptoms always.  Requesting Reglan.  Endorses photosensitivity.  States it has been going on "for long enough".  Denies fever or nuchal rigidity.  Review of Systems  Positive: See above Negative: See above  Physical Exam  BP 116/79 (BP Location: Right Arm)   Pulse 62   Temp 98.2 F (36.8 C) (Oral)   Resp 17   Ht 5\' 6"  (1.676 m)   Wt 70.8 kg   SpO2 95%   BMI 25.18 kg/m  Gen:   Awake, no distress   Resp:  Normal effort  MSK:   Moves extremities without difficulty  Other:    Medical Decision Making  Medically screening exam initiated at 3:40 PM.  Appropriate orders placed.  Kyle Walters was informed that the remainder of the evaluation will be completed by another provider, this initial triage assessment does not replace that evaluation, and the importance of remaining in the ED until their evaluation is complete.  Work up started   Gareth Eagle, [place] 06/24/23 1542

## 2023-06-24 NOTE — ED Notes (Signed)
 Pt stated he will come back on another day when its not busy. RN notified.

## 2023-06-24 NOTE — ED Triage Notes (Signed)
 Pt reports migraine since Wednesday. Has tried home meds with no relief. H/x same

## 2023-06-26 ENCOUNTER — Ambulatory Visit: Payer: Medicare Other | Attending: Cardiology | Admitting: Cardiology

## 2023-06-26 ENCOUNTER — Encounter: Payer: Self-pay | Admitting: Cardiology

## 2023-06-26 VITALS — BP 122/66 | HR 54 | Ht 66.0 in | Wt 155.2 lb

## 2023-06-26 DIAGNOSIS — I2510 Atherosclerotic heart disease of native coronary artery without angina pectoris: Secondary | ICD-10-CM

## 2023-06-26 NOTE — Progress Notes (Signed)
 Cardiology Office Note:  .   Date:  06/26/2023  ID:  Kyle Walters, DOB 1955-12-31, MRN [id number] PCP: Rometta Emery, MD  [facility] Providers Cardiologist:  Truett Mainland, MD PCP: Rometta Emery, MD  Chief Complaint  Patient presents with   Coronary Artery Disease      History of Present Illness: .    Kyle Walters is a 68 y.o. male with hypertension, CAD, h/o LV thrombus  Patient is doing well.  His doing more exercise without any complaints.  Reviewed echocardiogram on stress doses of the patient, details below.  Cardiac MRI is pending.   Vitals:   06/26/23 1525  BP: 122/66  Pulse: (!) 54  SpO2: 96%      ROS:  Review of Systems  Cardiovascular:  Negative for chest pain, claudication, dyspnea on exertion, leg swelling, palpitations and syncope.     Studies Reviewed: Marland Kitchen        EKG 03/26/2023: Normal sinus rhythm Anterolateral infarct (cited on or before 22-Jan-2023) When compared with ECG of 22-Jan-2023 02:15, No significant change since   Echocardiogram 06/05/2023: 1. Apex is aneurysmal. No LV thrombus seen. Left ventricular ejection  fraction, by estimation, is 50 to 55%. The left ventricle has low normal  function. Left ventricular diastolic parameters are consistent with Grade  II diastolic dysfunction (pseudonormalization).   2. Right ventricular systolic function is normal. The right ventricular  size is normal. There is normal pulmonary artery systolic pressure.   3. The aortic valve is tricuspid. Aortic valve regurgitation is not  visualized.   4. The inferior vena cava is normal in size with greater than 50%  respiratory variability, suggesting right atrial pressure of 3 mmHg.   Comparison(s): No significant change from prior study.   Stress test 03/2023:   Findings are consistent with infarction. The study is intermediate risk.   No ST deviation was noted.   LV perfusion is abnormal. There is no evidence of ischemia. There  is evidence of infarction. Defect 1: There is a large defect with absent uptake present in the apical to mid anterior, anteroseptal, inferior, septal and apex location(s) that is fixed. Viability is absent. There is abnormal wall motion in the defect area. Consistent with infarction.   Left ventricular function is abnormal. Global function is moderately reduced. There was a single regional abnormality. Nuclear stress EF: 41%. The left ventricular ejection fraction is moderately decreased (30-44%). End diastolic cavity size is mildly enlarged. End systolic cavity size is mildly enlarged.   Large anteroapical/septal distal anterior wall and inferior apical MI with aneurysmal formation Moderatetly reduced EF 41% Consider MRI quantification given large scar burden Normal ETT with no ischemic changes No significant arrhythmias and normal hemodynamic response to exercise    Vascular ultrasound 03/2023: Right: Resting right ankle-brachial index is within normal range. The  right toe-brachial index is normal.   Left: Resting left ankle-brachial index is within normal range. The left  toe-brachial index is normal.   Independently interpreted Labs 02/05/2023: Chol 116, TG 36, HDL 58, LDL 48 Hb 13.8  Coronary angiogram 03/14/2021:  Mid LAD lesion is 100% stenosed.  Left to left collaterals.  The apex was nonviable by cardiac MRI.   RPAV lesion is 100% stenosed.  Left to right collaterals.   Prox RCA lesion is 90% stenosed.   A drug-eluting stent was successfully placed using a STENT ONYX FRONTIER 3.0X34, postdilated to greater than 3.75 mm.   Post intervention, there is a 0% residual stenosis.  Mid RCA lesion is 75% stenosed.   A drug-eluting stent was successfully placed using a STENT ONYX FRONTIER 2.75X22.   Post intervention, there is a 0% residual stenosis.   LV end diastolic pressure is normal.   There is no aortic valve stenosis.   Continue dual antiplatelet therapy with aspirin and Plavix.   He has completed 3 months of anticoagulation with Eliquis for his LV thrombus.  Continue aggressive secondary prevention including high-dose statin.  Prevacid will have to be changed to Protonix.   Cardiac MRI 03/02/2021: 1. Normal LV size with EF 51%. Apical aneurysm with layered LV thrombus present.  2.  Normal RV size and systolic function, EF 53%. 3. Near transmural LGE involving the true apex and the peri-apical segments. This area is unlikely to improve with revasculariziation (not viable). The remainder of the LV has minimal LGE and appears viable.      Physical Exam:   Physical Exam Vitals and nursing note reviewed.  Constitutional:      General: He is not in acute distress. Neck:     Vascular: No JVD.  Cardiovascular:     Rate and Rhythm: Normal rate and regular rhythm.     Pulses:          Dorsalis pedis pulses are 2+ on the right side and 2+ on the left side.       Posterior tibial pulses are 1+ on the right side and 1+ on the left side.     Heart sounds: Normal heart sounds. No murmur heard. Pulmonary:     Effort: Pulmonary effort is normal.     Breath sounds: Normal breath sounds. No wheezing or rales.  Musculoskeletal:     Right lower leg: No edema.     Left lower leg: No edema.      VISIT DIAGNOSES:   ICD-10-CM   1. Coronary artery disease involving native coronary artery of native heart without angina pectoris  I25.10         ASSESSMENT AND PLAN: .    Kyle Walters is a 68 y.o. male with hypertension, CAD, h/o LV thrombus  CAD: Stress testing with prior infarct, no ischemia (03/2023). Echocardiogram showed aneurysmal apex without clear thrombus, although Definity contrast could not be injected due to IV access issues (05/2023). Cardiac MRI pending, has had difficulty scheduling. Unless cardiac MRI definitively shows LV thrombus, I do not think he needs anticoagulation at this point. Continue Plavix monotherapy, which she has tolerated well, along  with statin.  Lipids well-controlled.   F/u in 3 months  Signed, Elder Negus, MD

## 2023-06-26 NOTE — Patient Instructions (Signed)
 Medication Instructions:   Your physician recommends that you continue on your current medications as directed. Please refer to the Current Medication list given to you today.  *If you need a refill on your cardiac medications before your next appointment, please call your pharmacy*    Follow-Up: At [facility], you and your health needs are our priority.  As part of our continuing mission to provide you with exceptional heart care, we have created designated Provider Care Teams.  These Care Teams include your primary Cardiologist (physician) and Advanced Practice Providers (APPs -  Physician Assistants and Nurse Practitioners) who all work together to provide you with the care you need, when you need it.  We recommend signing up for the patient portal called "MyChart".  Sign up information is provided on this After Visit Summary.  MyChart is used to connect with patients for Virtual Visits (Telemedicine).  Patients are able to view lab/test results, encounter notes, upcoming appointments, etc.  Non-urgent messages can be sent to your provider as well.   To learn more about what you can do with MyChart, go to [email or web address].    Your next appointment:   6 month(s)  Provider:   Dr. Rosemary Holms   Other Instructions    1st Floor: - Lobby - Registration  - Pharmacy  - Lab - Cafe  2nd Floor: - PV Lab - Diagnostic Testing (echo, CT, nuclear med)  3rd Floor: - Vacant  4th Floor: - TCTS (cardiothoracic surgery) - AFib Clinic - Structural Heart Clinic - Vascular Surgery  - Vascular Ultrasound  5th Floor: - [facility] Cardiology (general and EP) - Clinical Pharmacy for coumadin, hypertension, lipid, weight-loss medications, and med management appointments    Valet parking services will be available as well.

## 2023-06-27 ENCOUNTER — Encounter (HOSPITAL_COMMUNITY): Payer: Self-pay

## 2023-07-03 ENCOUNTER — Ambulatory Visit: Payer: Medicare Other | Admitting: Nurse Practitioner

## 2023-07-09 ENCOUNTER — Telehealth: Payer: Self-pay | Admitting: Adult Health

## 2023-07-09 NOTE — Telephone Encounter (Signed)
 [facility] calling to schedule delivery of Botox. Informed Botox Coordinator will call back to schedule.

## 2023-07-15 ENCOUNTER — Other Ambulatory Visit (HOSPITAL_COMMUNITY): Payer: Self-pay

## 2023-07-15 MED ORDER — COVID-19 MRNA VAC-TRIS(PFIZER) 30 MCG/0.3ML IM SUSY
PREFILLED_SYRINGE | INTRAMUSCULAR | 0 refills | Status: AC
  Filled 2023-07-15: qty 0.3, 1d supply, fill #0

## 2023-07-19 ENCOUNTER — Telehealth (HOSPITAL_COMMUNITY): Payer: Self-pay | Admitting: *Deleted

## 2023-07-19 NOTE — Telephone Encounter (Signed)
 Reaching out to patient to offer assistance regarding upcoming cardiac imaging study; pt verbalizes understanding of appt date/time, parking situation and where to check in, pre-test NPO status and medications ordered, and verified current allergies; name and call back number provided for further questions should they arise Johney Frame RN Navigator Cardiac Imaging [facility] [phone] office [phone] cell  Patient denies metal, reports mild claustrophobia; has had previous MRIs without issues.

## 2023-07-19 NOTE — Telephone Encounter (Signed)
 Attempted to call patient regarding upcoming cardiac MRI appointment. Left message on voicemail with name and callback number Johney Frame RN Navigator Cardiac Imaging [facility] [phone] Office

## 2023-07-22 ENCOUNTER — Other Ambulatory Visit: Payer: Self-pay | Admitting: Cardiology

## 2023-07-22 ENCOUNTER — Ambulatory Visit (HOSPITAL_COMMUNITY)
Admission: RE | Admit: 2023-07-22 | Discharge: 2023-07-22 | Disposition: A | Discharge status: Home / Self Care | Source: Ambulatory Visit | Attending: Cardiology | Admitting: Cardiology

## 2023-07-22 DIAGNOSIS — I513 Intracardiac thrombosis, not elsewhere classified: Secondary | ICD-10-CM | POA: Diagnosis not present

## 2023-07-22 MED ORDER — GADOBUTROL 1 MMOL/ML IV SOLN
10.0000 mL | Freq: Once | INTRAVENOUS | Status: AC | PRN
Start: 2023-07-22 — End: 2023-07-22
  Administered 2023-07-22: 10 mL via INTRAVENOUS

## 2023-07-23 ENCOUNTER — Other Ambulatory Visit: Payer: Self-pay

## 2023-07-23 MED ORDER — APIXABAN 5 MG PO TABS: 5.0000 mg | ORAL_TABLET | Freq: Two times a day (BID) | ORAL | 3 refills | Status: DC

## 2023-07-23 NOTE — Progress Notes (Signed)
 Cardiac MRI does raise suspicion for a place morning, this, and wall motion abnormality in the apical region that increases risk for future thrombus.  With this finding, it may be prudent to go back on anticoagulation.  I think can be treated with Eliquis 5 mg twice daily, instead of having to go back on warfarin.  With initiation of Eliquis, I would recommend stopping Plavix reduce risk of increased bleeding risk.  Thanks MJP

## 2023-07-23 NOTE — Progress Notes (Signed)
 I dont think repeat MRI is needed. We could repeat echo in 3 months.  Thanks MJP

## 2023-07-25 ENCOUNTER — Other Ambulatory Visit: Payer: Self-pay

## 2023-07-25 DIAGNOSIS — I513 Intracardiac thrombosis, not elsewhere classified: Secondary | ICD-10-CM

## 2023-08-05 ENCOUNTER — Telehealth: Payer: Self-pay | Admitting: Cardiology

## 2023-08-05 DIAGNOSIS — M25551 Pain in right hip: Secondary | ICD-10-CM | POA: Diagnosis not present

## 2023-08-05 DIAGNOSIS — W19XXXA Unspecified fall, initial encounter: Secondary | ICD-10-CM | POA: Diagnosis not present

## 2023-08-05 DIAGNOSIS — M5441 Lumbago with sciatica, right side: Secondary | ICD-10-CM | POA: Diagnosis not present

## 2023-08-05 DIAGNOSIS — M5442 Lumbago with sciatica, left side: Secondary | ICD-10-CM | POA: Diagnosis not present

## 2023-08-05 DIAGNOSIS — M25552 Pain in left hip: Secondary | ICD-10-CM | POA: Diagnosis not present

## 2023-08-05 NOTE — Telephone Encounter (Signed)
 Contacted patient and he states that he might be starting use of meloxicam. Advised patient that meloxicam can cause elevated blood pressures, but will ask Dr. Filiberto Hug for further advisement. Pt was also wondering about use of Ibuprofen or Robaxin as well.

## 2023-08-05 NOTE — Telephone Encounter (Signed)
 Pt called in stating he is having a "sacraliliac" issue and he was prescribed Meloxicam. He asked if this is safe to take. Please advise.

## 2023-08-06 NOTE — Telephone Encounter (Signed)
 Most importantly, meloxicam will increase bleeding risk in the setting of ongoing use of Eliquis. Prefer tylenol for pain.  Thanks MJP

## 2023-08-08 NOTE — Telephone Encounter (Signed)
 Pt would like to know your thoughts on Robaxin use.

## 2023-08-08 NOTE — Telephone Encounter (Signed)
 No cardiac contraindication.  Thanks MJP

## 2023-08-08 NOTE — Telephone Encounter (Signed)
 Pt contacted and advised. States understanding and no further questions at this time.

## 2023-08-08 NOTE — Telephone Encounter (Signed)
 Left message to call back

## 2023-08-12 DIAGNOSIS — M5137 Other intervertebral disc degeneration, lumbosacral region with discogenic back pain only: Secondary | ICD-10-CM | POA: Diagnosis not present

## 2023-08-12 DIAGNOSIS — M4856XA Collapsed vertebra, not elsewhere classified, lumbar region, initial encounter for fracture: Secondary | ICD-10-CM | POA: Diagnosis not present

## 2023-08-22 DIAGNOSIS — S32010A Wedge compression fracture of first lumbar vertebra, initial encounter for closed fracture: Secondary | ICD-10-CM | POA: Diagnosis not present

## 2023-08-22 DIAGNOSIS — M810 Age-related osteoporosis without current pathological fracture: Secondary | ICD-10-CM | POA: Diagnosis not present

## 2023-08-22 DIAGNOSIS — M5430 Sciatica, unspecified side: Secondary | ICD-10-CM | POA: Diagnosis not present

## 2023-08-23 ENCOUNTER — Telehealth: Payer: Self-pay | Admitting: Cardiology

## 2023-08-23 NOTE — Telephone Encounter (Signed)
 Patel, Vaishali K, RPH  [facility] Triage5 minutes ago (3:07 PM)    Ok to take tramadol with his heart meds   Patient identification verified by 2 forms. [name], RN    Called and spoke to patient  Relayed message  Patient verbalized understanding, no questions at this time

## 2023-08-23 NOTE — Telephone Encounter (Signed)
 Pt c/o medication issue:  1. Name of Medication:   Tramadol  2. How are you currently taking this medication (dosage and times per day)?   Not taking yet  3. Are you having a reaction (difficulty breathing--STAT)?   4. What is your medication issue?   Patient stated he injured his vertebrae and has been prescribed this medication for pain and wants to know if he can take this medication.

## 2023-08-23 NOTE — Telephone Encounter (Signed)
 Spoke with patient. He has been prescribed Tramadol 50 mg for recent back injury. He would like to know if OK to take with his current medications.  Will forward to Pharm D team to review.

## 2023-08-26 ENCOUNTER — Ambulatory Visit: Admitting: Nurse Practitioner

## 2023-08-26 ENCOUNTER — Encounter: Payer: Self-pay | Admitting: Nurse Practitioner

## 2023-08-26 VITALS — BP 126/72 | HR 64 | Ht 66.0 in | Wt 157.8 lb

## 2023-08-26 DIAGNOSIS — Z87891 Personal history of nicotine dependence: Secondary | ICD-10-CM | POA: Diagnosis not present

## 2023-08-26 DIAGNOSIS — S32010S Wedge compression fracture of first lumbar vertebra, sequela: Secondary | ICD-10-CM

## 2023-08-26 DIAGNOSIS — J449 Chronic obstructive pulmonary disease, unspecified: Secondary | ICD-10-CM

## 2023-08-26 DIAGNOSIS — S32000A Wedge compression fracture of unspecified lumbar vertebra, initial encounter for closed fracture: Secondary | ICD-10-CM | POA: Insufficient documentation

## 2023-08-26 NOTE — Patient Instructions (Signed)
 Continue Albuterol  inhaler 2 puffs every 6 hours as needed for shortness of breath or wheezing. Notify if symptoms persist despite rescue inhaler/neb use.  Continue Breo 1 puff daily. Brush tongue and rinse mouth afterwards  Use incentive spirometer 10 times an hour while awake during your recovery period and until you're back to normal activities  You can get one of these off [employer] should hear something in the next few weeks about scheduling your lung cancer screening CT in June   Follow up in 6 months with Dr. Baldwin Walters or Kyle Ford Peddie,NP. If symptoms do not improve or worsen, please contact office for sooner follow up or seek emergency care.

## 2023-08-26 NOTE — Assessment & Plan Note (Signed)
 Moderate COPD. Slight increase in dyspnea appears to be related to back brace and acute back pain. Lung exam is clear. Does not appear to be in acute exacerbation. Encouraged him to use IS during recovery period. Continue Breo for maintenance and PRN SABA. Unable to tolerate LAMA. Action plan in place.  Patient Instructions  Continue Albuterol  inhaler 2 puffs every 6 hours as needed for shortness of breath or wheezing. Notify if symptoms persist despite rescue inhaler/neb use.  Continue Breo 1 puff daily. Brush tongue and rinse mouth afterwards  Use incentive spirometer 10 times an hour while awake during your recovery period and until you're back to normal activities  You can get one of these off [employer] should hear something in the next few weeks about scheduling your lung cancer screening CT in June   Follow up in 6 months with Dr. Baldwin Levee or Katie Seanpatrick Maisano,NP. If symptoms do not improve or worsen, please contact office for sooner follow up or seek emergency care.

## 2023-08-26 NOTE — Assessment & Plan Note (Signed)
 Acute L1 compression fracture. Follow up with PCP as scheduled

## 2023-08-26 NOTE — Assessment & Plan Note (Addendum)
 Former smoker. Due for LDCT chest June 2025. Will reach out to screening program regarding scheduling

## 2023-08-26 NOTE — Progress Notes (Signed)
 @Patient  ID: Kyle Walters, male    DOB: [birth year], 68 y.o.   MRN: [id number]  Chief Complaint  Patient presents with   Follow-up    Patient states is hard to breath with abdominal binder    Referring provider: Davida Espy, MD  HPI: 68 year old male, former smoker followed for asthmatic COPD, superimposed restrictive lung disease. He is a patient of Dr. Lacinda Pica and last seen in office 02/21/2023. Past medical history significant for postconcussion syndrome from prior trauma, depression, anxiety, apical LV aneurysm and layered LV thrombus (resolved 07/2021), migraines, polyclonal gammopathy, CAD s/p stent, HLD. Followed by lung cancer screening program.  TEST/EVENTS:  09/26/2022 LDCT chest: atherosclerosis. No adenopathy. Small calcified granulomas. Mild diffuse bronchial wall thickening with moderate emphysema. Lung RADS 1S 01/01/2023 PFT: FVC 74, FEV1 60, ratio 59, TLC 106, DLCO 75. No BD   02/21/2023: OV with Dr. Baldwin Levee. Small calcified granulomas with reassuring imaging. Follows with lung cancer screening program. He has moderately severe obstruction on PFT 01/01/2023. Has some hypersomnolence but reassuring PSG 10/2022. Given trial of Trelegy previously. He was changed to Stiolto after this but had more headaches/migraines. Changed back to [facility]. Felt like LAMA was issue with migraines/tolerance more so than ICS.   08/26/2023: Today - follow up Patient presents today for follow up. His breathing has been stable since he was here last. He did fall in early April. He had ongoing back pain after this and finally had a MRI with L1 compression fracture. His PCP put him in a back brace last week. Since he's been using the back brace, he feels like he's had a little more trouble with his breathing and activity tolerance. He believes this is due to the brace. Without the brace, he doesn't have trouble. He feels like he's more so of a belly breather. He denies any cough, wheezing, chest congestion.  Still using Breo. Not having to use his rescue.   Allergies  Allergen Reactions   Plasticized Base [Plastibase] Swelling    Swelling mouth, eye lids, bridge of nose and under eyes.  (Plastic bottles, plastic sleeve over thermometer, straws)   Tetracycline Anaphylaxis and Other (See Comments)   Triptans     Contraindicated d/t h/o MI    Effexor [Venlafaxine Hcl] Rash   Prednisone Other (See Comments)    Unable to sleep    Sulfamethoxazole-Trimethoprim Nausea Only    Immunization History  Administered Date(s) Administered   Fluad Quad(high Dose 65+) 02/19/2020   Fluad Trivalent(High Dose 65+) 02/21/2023   Influenza Inj Mdck Quad Pf 02/18/2019   Influenza Split 01/05/2013, 01/30/2016   Influenza,inj,Quad PF,6+ Mos 01/05/2014, 01/21/2015, 01/24/2017   Moderna SARS-COV2 Booster Vaccination 09/07/2020   PFIZER(Purple Top)SARS-COV-2 Vaccination 07/17/2019, 08/14/2019, 02/23/2020   PNEUMOCOCCAL CONJUGATE-20 09/07/2021   Pfizer(Comirnaty)Fall Seasonal Vaccine 12 years and older 07/15/2023   Tdap 10/27/2021    Past Medical History:  Diagnosis Date   ADHD (attention deficit hyperactivity disorder)    Allergy    Anemia    Anxiety panic attack   Arthritis    COPD (chronic obstructive pulmonary disease) (HCC)    bronchitis/emphysema   Coronary artery disease    Depression    Emphysema lung (HCC)    Emphysema of lung (HCC)    Enlarged prostate    Fibromyalgia    GERD (gastroesophageal reflux disease)    Hyperlipidemia    Hypertension    IBS (irritable bowel syndrome)    Migraine    Myocardial infarction (HCC)  Panic attack     Tobacco History: Social History   Tobacco Use  Smoking Status Former   Current packs/day: 0.00   Average packs/day: 1.5 packs/day for 40.0 years (60.0 ttl pk-yrs)   Types: Cigarettes   Start date: 05/17/1973   Quit date: 05/17/2013   Years since quitting: 10.2  Smokeless Tobacco Never   Counseling given: Not Answered   Outpatient  Medications Prior to Visit  Medication Sig Dispense Refill   albuterol  (VENTOLIN  HFA) 108 (90 Base) MCG/ACT inhaler Inhale 2 puffs into the lungs every 6 (six) hours as needed for wheezing or shortness of breath. 8 g 6   ALPRAZolam  (XANAX ) 0.5 MG tablet Take 0.5 mg by mouth every 6 (six) hours.     apixaban  (ELIQUIS ) 5 MG TABS tablet Take 1 tablet (5 mg total) by mouth 2 (two) times daily. 60 tablet 3   Atogepant  (QULIPTA ) 60 MG TABS Take 1 tablet by mouth daily.     B Complex Vitamins (VITAMIN B-COMPLEX) TABS Take by mouth.     botulinum toxin Type A  (BOTOX ) 200 units injection INJECT 155 UNITS INTRAMUSCULARLY TO HEAD AND NECK EVERY 3 MONTHS  (DISCARD UNUSED AFTER FIRST USE) 1 each 3   Cholecalciferol 50 MCG (2000 UT) TABS Take by mouth.     COVID-19 mRNA vaccine, Pfizer, (COMIRNATY) syringe Inject into the muscle. 0.3 mL 0   doxazosin  (CARDURA ) 8 MG tablet Take 8 mg by mouth daily.     EMGALITY  120 MG/ML SOAJ INJECT SUBCUTANEOUSLY THE  CONTENTS OF 1 PEN (120 MG)  MONTHLY AS MAINTENANCE DOSE 3 mL 3   EPINEPHrine 0.3 mg/0.3 mL IJ SOAJ injection Inject 0.3 mg into the muscle as needed.     escitalopram  (LEXAPRO ) 20 MG tablet Take 10 mg by mouth in the morning.  2   fluticasone  furoate-vilanterol (BREO ELLIPTA ) 100-25 MCG/ACT AEPB Inhale 1 puff into the lungs daily. 60 each 5   gabapentin  (NEURONTIN ) 400 MG capsule Take 400 mg by mouth 4 (four) times daily.      memantine  (NAMENDA ) 10 MG tablet TAKE 2 TABLETS BY MOUTH IN THE MORNING AND 1 TABLET IN THE LATE AFTERNOON 90 tablet 0   Multiple Vitamin (MULTIVITAMIN WITH MINERALS) TABS tablet Take 1 tablet by mouth daily after breakfast. Centrum Silver for Men     Nerve Stimulator (NERIVIO) DEVI USE WITHIN 1 HR OF MIGRAINE. SET A STRONG YET COMFORTABLE INTENSITY LEVEL & MAINTAIN LEVEL FOR 45 MINS 1 each 11   nitroGLYCERIN  (NITROSTAT ) 0.4 MG SL tablet Place 1 tablet (0.4 mg total) under the tongue every 5 (five) minutes as needed. 25 tablet 2   Omega-3  Fatty Acids (FISH OIL) 1200 MG CAPS Take 1,200 mg by mouth daily after breakfast.     Riboflavin POWD Take by mouth.     rosuvastatin  (CRESTOR ) 20 MG tablet TAKE 1 TABLET BY MOUTH EVERY DAY 90 tablet 3   silodosin  (RAPAFLO ) 8 MG CAPS capsule Take 8 mg by mouth daily with breakfast.     Ubrogepant  (UBRELVY ) 100 MG TABS TAKE 1 TABLET MOUTH AS NEEDED AS CLOSE TO MIGRAINE ONSET AS POSSIBLE. MAY REPEAT IN 2 HOURS IF NEEDED. MAX 200 MG PER 24 HOURS. (Patient taking differently: Take 100 mg by mouth as directed. Take 1 tablet as close to migraine onset as possible. May repeat in 2 hours if needed, max dose 200 mg /daily.) 16 tablet 11   vitamin B-12 (CYANOCOBALAMIN) 500 MCG tablet Take 500 mcg by mouth daily.  No facility-administered medications prior to visit.     Review of Systems:   Constitutional: No weight loss or gain, night sweats, fevers, chills, fatigue, or lassitude. HEENT: No headaches, difficulty swallowing, tooth/dental problems, or sore throat. No sneezing, itching, ear ache, nasal congestion, or post nasal drip CV:  No chest pain, orthopnea, PND, swelling in lower extremities, anasarca, dizziness, palpitations, syncope Resp: +shortness of breath with exertion. No excess mucus or change in color of mucus. No productive or non-productive. No hemoptysis. No wheezing.  No chest wall deformity GI:  No heartburn, indigestion, abdominal pain, nausea, vomiting, diarrhea, change in bowel habits, loss of appetite, bloody stools.  GU: No dysuria, change in color of urine, urgency or frequency.   Skin: No rash, lesions, ulcerations MSK:  No joint pain or swelling. +back pain Neuro: No dizziness or lightheadedness.  Psych: +stable depression, anxiety. Mood stable.     Physical Exam:  BP 126/72 (BP Location: Right Arm, Patient Position: Sitting)   Pulse 64   Ht 5\' 6"  (1.676 m)   Wt 157 lb 12.8 oz (71.6 kg)   SpO2 93%   BMI 25.47 kg/m   GEN: Pleasant, interactive, well-kempt; in no  acute distress HEENT:  Normocephalic and atraumatic. PERRLA. Sclera white. Nasal turbinates pink, moist and patent bilaterally. No rhinorrhea present. Oropharynx pink and moist, without exudate or edema. No lesions, ulcerations, or postnasal drip.  NECK:  Supple w/ fair ROM. No JVD present. Normal carotid impulses w/o bruits. Thyroid symmetrical with no goiter or nodules palpated. No lymphadenopathy.   CV: RRR, no m/r/g, no peripheral edema. Pulses intact, +2 bilaterally. No cyanosis, pallor or clubbing. PULMONARY:  Unlabored, regular breathing. Clear bilaterally A&P w/o wheezes/rales/rhonchi. No accessory muscle use.  GI: BS present and normoactive. Soft, non-tender to palpation. No organomegaly or masses detected.  MSK: No erythema, warmth. Cap refil <2 sec all extrem. Back brace in place  Neuro: A/Ox3. No focal deficits noted.   Skin: Warm, no lesions or rashe Psych: Normal affect and behavior. Judgement and thought content appropriate.     Lab Results:  CBC    Component Value Date/Time   WBC 7.0 02/04/2023 1358   RBC 4.38 02/04/2023 1358   HGB 13.8 02/04/2023 1358   HGB 14.4 03/07/2021 1442   HCT 42.3 02/04/2023 1358   HCT 42.5 03/07/2021 1442   PLT 188 02/04/2023 1358   PLT 245 03/07/2021 1442   MCV 96.6 02/04/2023 1358   MCV 92 03/07/2021 1442   MCH 31.5 02/04/2023 1358   MCHC 32.6 02/04/2023 1358   RDW 13.2 02/04/2023 1358   RDW 13.1 03/07/2021 1442   LYMPHSABS 2.1 01/19/2023 2303   MONOABS 0.6 01/19/2023 2303   EOSABS 0.2 01/19/2023 2303   BASOSABS 0.1 01/19/2023 2303    BMET    Component Value Date/Time   NA 139 02/04/2023 1358   NA 140 05/19/2021 1015   K 4.0 02/04/2023 1358   CL 102 02/04/2023 1358   CO2 29 02/04/2023 1358   GLUCOSE 83 02/04/2023 1358   BUN 15 02/04/2023 1358   BUN 14 05/19/2021 1015   CREATININE 0.89 02/04/2023 1358   CREATININE 1.16 12/31/2017 1327   CREATININE 1.19 11/15/2017 0958   CALCIUM  9.3 02/04/2023 1358   GFRNONAA >60  02/04/2023 1358   GFRNONAA >60 12/31/2017 1327   GFRNONAA 65 11/15/2017 0958   GFRAA >60 12/31/2017 1327   GFRAA 75 11/15/2017 0958    BNP No results found for: "BNP"   Imaging:  No  results found.  Administration History     None          Latest Ref Rng & Units 01/01/2023    1:30 PM 11/10/2014   12:58 PM 03/09/2013    3:00 PM  PFT Results  FVC-Pre L 2.88  3.29    FVC-Predicted Pre % 74  79  64   FVC-Post L 3.06  3.15  3.18   FVC-Predicted Post % 79  76  76   Pre FEV1/FVC % % 60  65  53   Post FEV1/FCV % % 59  65  59   FEV1-Pre L 1.72  2.15  1.43   FEV1-Predicted Pre % 60  69  44   FEV1-Post L 1.80  2.04  1.88   DLCO uncorrected ml/min/mmHg 17.80  21.25  20.68   DLCO UNC% % 76  78  76   DLCO corrected ml/min/mmHg 17.51     DLCO COR %Predicted % 75     DLVA Predicted % 85  87  87   TLC L 6.63   6.08   TLC % Predicted % 106   98   RV % Predicted % 153   167     No results found for: "NITRICOXIDE"      Assessment & Plan:   COPD (chronic obstructive pulmonary disease) Moderate COPD. Slight increase in dyspnea appears to be related to back brace and acute back pain. Lung exam is clear. Does not appear to be in acute exacerbation. Encouraged him to use IS during recovery period. Continue Breo for maintenance and PRN SABA. Unable to tolerate LAMA. Action plan in place.  Patient Instructions  Continue Albuterol  inhaler 2 puffs every 6 hours as needed for shortness of breath or wheezing. Notify if symptoms persist despite rescue inhaler/neb use.  Continue Breo 1 puff daily. Brush tongue and rinse mouth afterwards  Use incentive spirometer 10 times an hour while awake during your recovery period and until you're back to normal activities  You can get one of these off [employer] should hear something in the next few weeks about scheduling your lung cancer screening CT in June   Follow up in 6 months with Dr. Baldwin Levee or Katie Martavia Tye,NP. If symptoms do not improve or  worsen, please contact office for sooner follow up or seek emergency care.    Lumbar compression fracture (HCC) Acute L1 compression fracture. Follow up with PCP as scheduled  History of tobacco use Former smoker. Due for LDCT chest June 2025. Will reach out to screening program regarding scheduling   Advised if symptoms do not improve or worsen, to please contact office for sooner follow up or seek emergency care.   I spent 32 minutes of dedicated to the care of this patient on the date of this encounter to include pre-visit review of records, face-to-face time with the patient discussing conditions above, post visit ordering of testing, clinical documentation with the electronic health record, making appropriate referrals as documented, and communicating necessary findings to members of the patients care team.  Roetta Clarke, NP 08/26/2023  Pt aware and understands NP's role.

## 2023-08-28 ENCOUNTER — Telehealth: Payer: Self-pay | Admitting: *Deleted

## 2023-08-28 ENCOUNTER — Other Ambulatory Visit: Payer: Self-pay

## 2023-08-28 ENCOUNTER — Other Ambulatory Visit: Payer: Self-pay | Admitting: Internal Medicine

## 2023-08-28 DIAGNOSIS — M810 Age-related osteoporosis without current pathological fracture: Secondary | ICD-10-CM

## 2023-08-28 NOTE — Telephone Encounter (Signed)
 Attempted to contact patient to schedule yearly lung screening CT. Left VM for pt to return call to schedule.

## 2023-08-29 DIAGNOSIS — M47812 Spondylosis without myelopathy or radiculopathy, cervical region: Secondary | ICD-10-CM | POA: Diagnosis not present

## 2023-09-03 ENCOUNTER — Telehealth: Payer: Self-pay | Admitting: Adult Health

## 2023-09-03 NOTE — Telephone Encounter (Signed)
 Submitted auth request via CMM, status is pending. Key: [id number]

## 2023-09-03 NOTE — Telephone Encounter (Signed)
 Received approval, pt will continue to fill through [facility]. Auth#: [id number] (09/03/23-12/04/23)

## 2023-09-05 ENCOUNTER — Ambulatory Visit

## 2023-09-09 ENCOUNTER — Telehealth: Payer: Self-pay | Admitting: Adult Health

## 2023-09-09 NOTE — Telephone Encounter (Signed)
 LeAnna @ [facility] has called re: delivery of Botox  , delivery date set for 5-22  1 package, 90 days, SDV, 200 units

## 2023-09-22 DIAGNOSIS — M7051 Other bursitis of knee, right knee: Secondary | ICD-10-CM | POA: Diagnosis not present

## 2023-09-24 ENCOUNTER — Ambulatory Visit: Attending: Internal Medicine | Admitting: Physical Therapy

## 2023-09-24 ENCOUNTER — Encounter: Payer: Self-pay | Admitting: Physical Therapy

## 2023-09-24 ENCOUNTER — Other Ambulatory Visit: Payer: Self-pay

## 2023-09-24 DIAGNOSIS — M25561 Pain in right knee: Secondary | ICD-10-CM | POA: Insufficient documentation

## 2023-09-24 DIAGNOSIS — M5459 Other low back pain: Secondary | ICD-10-CM | POA: Insufficient documentation

## 2023-09-24 DIAGNOSIS — M6281 Muscle weakness (generalized): Secondary | ICD-10-CM | POA: Insufficient documentation

## 2023-09-24 NOTE — Therapy (Signed)
 OUTPATIENT PHYSICAL THERAPY THORACOLUMBAR EVALUATION  Patient Name: Kyle Walters MRN: [id number] DOB:Sep 01, 1955, 68 y.o., male Today's Date: 09/25/2023   PT End of Session - 09/25/23 0811     Visit Number 1    Number of Visits --   1-2x/week   Date for PT Re-Evaluation 11/20/23    Progress Note Due on Visit 10    PT Start Time 0345    PT Stop Time 0426    PT Time Calculation (min) 41 min             Past Medical History:  Diagnosis Date   ADHD (attention deficit hyperactivity disorder)    Allergy    Anemia    Anxiety panic attack   Arthritis    COPD (chronic obstructive pulmonary disease) (HCC)    bronchitis/emphysema   Coronary artery disease    Depression    Emphysema lung (HCC)    Emphysema of lung (HCC)    Enlarged prostate    Fibromyalgia    GERD (gastroesophageal reflux disease)    Hyperlipidemia    Hypertension    IBS (irritable bowel syndrome)    Migraine    Myocardial infarction (HCC)    Panic attack    Past Surgical History:  Procedure Laterality Date   CARDIAC CATHETERIZATION     CORONARY STENT INTERVENTION N/A 03/14/2021   Procedure: CORONARY STENT INTERVENTION;  Surgeon: Lucendia Rusk, MD;  Location: [facility] INVASIVE CV [facility];  Service: Cardiovascular;  Laterality: N/A;   LEFT HEART CATH AND CORONARY ANGIOGRAPHY N/A 03/14/2021   Procedure: LEFT HEART CATH AND CORONARY ANGIOGRAPHY;  Surgeon: Lucendia Rusk, MD;  Location: [facility] INVASIVE CV LAB;  Service: Cardiovascular;  Laterality: N/A;   NO PAST SURGERIES     Patient Active Problem List   Diagnosis Date Noted   Lumbar compression fracture (HCC) 08/26/2023   Abnormal echocardiogram 03/26/2023   S/P coronary artery stent placement 03/26/2023   Claudication (HCC) 03/26/2023   OSA (obstructive sleep apnea) 11/14/2022   Daytime sleepiness 10/23/2022   History of tobacco use 09/07/2021   Thrombus in heart chamber 04/03/2021   Hyperlipidemia 03/14/2021   Coronary artery disease    Chronic  migraine without aura, with intractable migraine, so stated, with status migrainosus 07/14/2020   Severe episode of recurrent major depressive disorder, without psychotic features (HCC) 07/14/2020   Polyclonal gammopathy determined by serum protein electrophoresis 02/25/2018   Primary osteoarthritis of both feet 12/13/2017   History of BPH 12/13/2017   Family history of psoriasis in mother 12/13/2017   History of gastroesophageal reflux (GERD) 12/13/2017   Anxiety and depression 12/13/2017   COPD (chronic obstructive pulmonary disease) (HCC) 01/29/2013   CHEST PAIN-PRECORDIAL 11/01/2009   Primary osteoarthritis of both hands 10/12/2009   CHEST PAIN 10/12/2009    PCP: Kyle Espy, MD  REFERRING PROVIDER: Davida Espy, MD  THERAPY DIAG:  Other low back pain - Plan: PT plan of care cert/re-cert  Right knee pain, unspecified chronicity - Plan: PT plan of care cert/re-cert  Muscle weakness - Plan: PT plan of care cert/re-cert  REFERRING DIAG: Lumbar compression fracture L1  Rationale for Evaluation and Treatment:  Rehabilitation  SUBJECTIVE:  PERTINENT PAST HISTORY:  asthmatic COPD, superimposed restrictive lung disease, postconcussion syndrome from prior trauma, depression, anxiety, apical LV aneurysm and layered LV thrombus (resolved 07/2021), migraines, polyclonal gammopathy, CAD s/p stent, HLD, fibromyalgia        PRECAUTIONS: None  WEIGHT BEARING RESTRICTIONS No  FALLS:  Has patient fallen in  last 6 months? Yes, Number of falls: sleep walking led to fracture  MOI/History of condition:  Onset date: 07/31/2023  SUBJECTIVE STATEMENT  Kyle Walters is a 68 y.o. male who presents to clinic with chief complaint of low back pain with "sciatica".  He fell while sleep walking in early April resulting in a compression fracture at L1.  He has some radicular sxs of the R LE which is chronic but were exacerbated during the fall.  He has has intermittent n/t in the plantar  surface of his R foot.  He is also having some R knee pain and was diagnosed with pes anserine bursitis.  His PCP is managing is compression fracture and he had been in a back brace for about 4 weeks.  He has has been avoiding lifting and twisting since the injury.  From referring provider:  "08/26/2023: Today - follow up Patient presents today for follow up. His breathing has been stable since he was here last. He did fall in early April. He had ongoing back pain after this and finally had a MRI with L1 compression fracture. His PCP put him in a back brace last week. Since he's been using the back brace, he feels like he's had a little more trouble with his breathing and activity tolerance"  From PCP note 4/14:  "Back Pain: Kyle Walters is a 68 y.o. male who presents for evaluation of low back and right hip pain. He notes 10 days ago he had a possible fall leading to falling on his right side however cannot recall exactly what he fell on as it was while he was sleep walking. He did not have pain immediately and went back to sleep. 3-4 days after this he started having pain in the right lower back and right hip area that radiates down to his left knee. He rates the pain a 3/10 at rest majority of time however sometimes goes to 7/10 in intensity.  No prior back pain or injuries recently. Denies bowel or bladder incontinence. Has not been evaluated by anyone for this yet. Requests xrays.  He has tried tylenol  with minimal relief. Symptoms are exacerbated by exercise, flexion, sitting, standing, straining, and twisting. Symptoms are improved by acetaminophen . Kyle Walters has also tried nothing which provided no symptom relief. Kyle Walters has no other symptoms associated with the back pain. The patient has no "red flag" history indicative of complicated back pain."    Red flags:  denies   Pain:  Are you having pain? Yes Pain location: low back pain, R SI NPRS scale:  2/10 to 4/10 Aggravating factors:  bending, side bending Relieving factors: rest Pain description: aching Stage: Subacute  Are you having pain? Yes Pain location: R knee, pes anserine  NPRS scale:  2/10 to 5/10 Aggravating factors: walking, squatting Relieving factors: rest Pain description: aching Stage: Subacute    Occupation: na  Assistive Device: na  Hand Dominance: na  Patient Goals/Specific Activities: reduce pain, reduce guarding.    OBJECTIVE:   DIAGNOSTIC FINDINGS:  MRI shows wedge compression fracture L1  GENERAL OBSERVATION/GAIT: Kyle Walters, wearing lumbar brace  SENSATION: Light touch: Deficits intermittent plantar surface of R foot    LE MMT:  MMT Right (Eval) Left (Eval)  Hip flexion (L2, L3) 4 4  Knee extension (L3) 4 4  Knee flexion 4 4  Hip abduction    Hip extension    Hip external rotation 4+ 4+  Hip internal rotation 4 4  Hip adduction    Ankle  dorsiflexion (L4)    Ankle plantarflexion (S1)    Ankle inversion    Ankle eversion    Great Toe ext (L5)    Grossly     (Blank rows = not tested, score listed is out of 5 possible points.  N = WNL, D = diminished, C = clear for gross weakness with myotome testing, * = concordant pain with testing)  SPECIAL TESTS:  Straight leg raise: L (-), R (-) Slump: L (-), R (-)  MUSCLE LENGTH: Hamstrings: Right significant restriction; Left significant restriction  LE ROM:  ROM Right (Eval) Left (Eval)  Hip flexion    Hip extension    Hip abduction    Hip adduction    Hip internal rotation    Hip external rotation    Knee flexion [facility] WFL  Knee extension [facility]  Ankle dorsiflexion    Ankle plantarflexion    Ankle inversion    Ankle eversion      (Blank rows = not tested, N = WNL, * = concordant pain with testing)  Functional Tests  Eval                                                                PALPATION:   Mod TTP R pes anserine bursitis  PATIENT SURVEYS:  ODI: 8/50  TODAY'S TREATMENT   Therapeutic Exercise: Creating, reviewing, and completing below HEP  PATIENT EDUCATION ([place]/HM):  POC, diagnosis, prognosis, HEP, and outcome measures.  Pt educated via explanation, demonstration, and handout (HEP).  Pt confirms understanding verbally.   HOME EXERCISE PROGRAM: Access Code: [facility] URL: [email or web address] Date: 09/24/2023 Prepared by: Lesleigh Rash  Exercises - Supine Bridge  - 1 x daily - 7 x weekly - 3 sets - 5 reps - 2-3 seconds hold - Supine Quad Set  - 2 x daily - 7 x weekly - 2 sets - 10 reps - 10 second hold - Hooklying Isometric Clamshell  - 1 x daily - 7 x weekly - 3 sets - 10 reps - Supine Hip Adduction Isometric with Ball  - 1 x daily - 7 x weekly - 2 sets - 10 reps - 10'' hold  Treatment priorities   Eval        Concentrate on neutral/light ext based program initially with transition to safe lifting techniques over the next several weeks                                          ASSESSMENT:  CLINICAL IMPRESSION: Dow is a 68 y.o. male who presents to clinic with signs and sxs consistent with sub acute low back pain following fall from bed resulting in compression fracture at L1.  He has a history of R radicular sxs which were exacerbated in the injury and concurrent L knee pain.  He is currently wearing his back brace throughout the day and I encouraged him to continue this for the next few weeks.  We work on lifting techniques as appropriate.  He will benefit from skilled therapy to address relevant deficits and improve function and independence with daily activities such as dressing, bending, lifting, and housework.    OBJECTIVE IMPAIRMENTS:  Pain, LE strength, lumbar ROM  ACTIVITY LIMITATIONS: bending, lifting, squatting, housework, self care  PERSONAL FACTORS: See medical history and pertinent history   REHAB POTENTIAL: Good  CLINICAL DECISION MAKING: Evolving/moderate complexity  EVALUATION COMPLEXITY:  Moderate   GOALS:   SHORT TERM GOALS: Target date: 10/23/2023   Lea will be >75% HEP compliant to improve carryover between sessions and facilitate independent management of condition  Evaluation: ongoing Goal status: INITIAL   LONG TERM GOALS: Target date: 11/20/2023   Kaemon will self report >/= 50% decrease in low back pain from evaluation to improve function in daily tasks  Evaluation/Baseline: 4/10 max pain Goal status: INITIAL   2.  Melchor will show a >/= 4 pt improvement in their ODI score (MCID is 12% or 6/50 pts) as a proxy for functional improvement   Evaluation/Baseline: 8 pts Goal status: INITIAL   3.  Jaxyn will be able to lift 10 lbs from the floor while maintaining neutral spine and place on a 3 foot counter, not limited by pain   Evaluation/Baseline: unable Goal status: INITIAL   4.  Blayke will self report >/= 50% decrease in pain R knee pain from evaluation to improve function in daily tasks  Evaluation/Baseline: 4/10 max pain Goal status: INITIAL   PLAN: PT FREQUENCY: 1-2x/week  PT DURATION: 8 weeks  PLANNED INTERVENTIONS:  [id number]- PT Re-evaluation, 97110-Therapeutic exercises, 97530- Therapeutic activity, 97112- Neuromuscular re-education, 97535- Self Care, 09811- Manual therapy, [id number]- Gait training, [id number]- Aquatic Therapy, [phone]- Electrical stimulation (manual), [id number]- Vasopneumatic device, [id number]- Traction (mechanical), [id number]- Ionotophoresis 4mg /ml Dexamethasone , Taping, Dry Needling, Joint manipulation, and Spinal manipulation.   Lesleigh Rash PT, DPT 09/25/2023, 9:23 AM  Date of referral: 5/5 Referring provider: Sudie Ely Referring diagnosis? wedge compression fracture of first  lumbar verebra  Treatment diagnosis? (if different than referring diagnosis)   What was this (referring dx) caused by? Hercules [place]ard of Condition: Initial Onset (within last 3 months)   Laterality: Both  Current Functional Measure Score:  Back Index 8/50  Objective measurements identify impairments when they are compared to normal values, the uninvolved extremity, and prior level of function.  [x]  Yes  []  No  Objective assessment of functional ability: Severe functional limitations   Briefly describe symptoms: Healing wedge compression fracture limiting bending and lifting; causing pain  How did symptoms start: fall while sleep walking  Average pain intensity:  Last 24 hours: 4  Past week: 4  How often does the pt experience symptoms? Constantly  How much have the symptoms interfered with usual daily activities? Quite a bit  How has condition changed since care began at this facility? NA - initial visit  In general, how is the patients overall health? Fair   BACK PAIN (STarT Back Screening Tool) Has pain spread down the leg(s) at some time in the last 2 weeks? y Has there been pain in the shoulder or neck at some time in the last 2 weeks? n Has the pt only walked short distances because of back pain? n Has patient dressed more slowly because of back pain in the past 2 weeks? y Does patient think it's not safe for a person with this condition to be physically active? n Does patient have worrying thoughts a lot of the time? n Does patient feel back pain is terrible and will never get any better? n Has patient stopped enjoying things they usually enjoy? n

## 2023-09-26 DIAGNOSIS — B3784 Candidal otitis externa: Secondary | ICD-10-CM | POA: Diagnosis not present

## 2023-09-26 DIAGNOSIS — D229 Melanocytic nevi, unspecified: Secondary | ICD-10-CM | POA: Diagnosis not present

## 2023-09-27 ENCOUNTER — Ambulatory Visit
Admission: RE | Admit: 2023-09-27 | Discharge: 2023-09-27 | Disposition: A | Discharge status: Home / Self Care | Source: Ambulatory Visit | Attending: Internal Medicine | Admitting: Internal Medicine

## 2023-09-27 DIAGNOSIS — Z87891 Personal history of nicotine dependence: Secondary | ICD-10-CM

## 2023-09-27 DIAGNOSIS — Z122 Encounter for screening for malignant neoplasm of respiratory organs: Secondary | ICD-10-CM | POA: Diagnosis not present

## 2023-09-30 ENCOUNTER — Ambulatory Visit: Payer: Medicare Other | Admitting: Adult Health

## 2023-10-01 ENCOUNTER — Encounter: Payer: Self-pay | Admitting: Adult Health

## 2023-10-02 ENCOUNTER — Ambulatory Visit: Payer: Medicare Other | Admitting: Adult Health

## 2023-10-02 ENCOUNTER — Ambulatory Visit: Admitting: Adult Health

## 2023-10-02 ENCOUNTER — Other Ambulatory Visit (HOSPITAL_COMMUNITY): Payer: Self-pay

## 2023-10-02 ENCOUNTER — Telehealth: Payer: Self-pay

## 2023-10-02 VITALS — BP 112/60 | HR 60

## 2023-10-02 DIAGNOSIS — G43711 Chronic migraine without aura, intractable, with status migrainosus: Secondary | ICD-10-CM

## 2023-10-02 MED ORDER — UBRELVY 100 MG PO TABS: ORAL_TABLET | ORAL | 5 refills | Status: AC

## 2023-10-02 MED ORDER — QULIPTA 60 MG PO TABS: 1.0000 | ORAL_TABLET | Freq: Every day | ORAL | 5 refills | Status: DC

## 2023-10-02 MED ORDER — ONABOTULINUMTOXINA 200 UNITS IJ SOLR: 155.0000 [IU] | Freq: Once | INTRAMUSCULAR | Status: DC

## 2023-10-02 MED ORDER — ONABOTULINUMTOXINA 200 UNITS IJ SOLR
155.0000 [IU] | Freq: Once | INTRAMUSCULAR | Status: AC
Start: 2023-10-02 — End: 2023-10-02
  Administered 2023-10-02: 155 [IU] via INTRAMUSCULAR

## 2023-10-02 NOTE — Progress Notes (Signed)
 Botox - 200 units x 1 vial Lot: [id number] Expiration: 01/2026 NDC: 1914-7829-56   Bacteriostatic 0.9% Sodium Chloride - * mL  Lot: [id number] Expiration: 12/23/2023 NDC: 5784-6962-95   Dx: M84.132 S/P Witnessed by: Arville Laughter

## 2023-10-02 NOTE — Telephone Encounter (Signed)
 Pharmacy Patient Advocate Encounter   Received notification from Patient Advice Request messages that prior authorization for Ubrelvy  100mg  Tablet is required/requested.   Insurance verification completed.   The patient is insured through [employer] .   Per test claim: The current 30 day co-pay is, $0.  No PA needed at this time. This test claim was processed through [facility]- copay amounts may vary at other pharmacies due to pharmacy/plan contracts, or as the patient moves through the different stages of their insurance plan.

## 2023-10-02 NOTE — Progress Notes (Signed)
 Update 10/02/2023 JM: Patient returns for repeat Botox .  Prior injection 04/29/2023 by Jacqlyn Matas, NP.  He requested 20-week span in between Botox  injections to see if he can eventually stop the injections. He does note gradual increased frequency of migraines over the past couple of months although he has not been on his Qulipta  since March due to insurance issues.  Recently received updated prescription and currently waiting on getting filled.  Provided samples during the interval time.  Remains on Ubrelvy  which typically works well.  He also reports a compression fracture in his lumbar spine after sleepwalking on his mattress and stepping off onto the floor about 2 months ago, this has been gradually healing but does feel this increased upper neck tension which has been triggering migraines.  Tolerated procedure well today.  He would like to resume doing Botox  every 3 months at this time, advised if migraines start to improve after restarting Qulipta  (which may be the reason for worsening recently), he can always push out his next botox .      Consent Form Botulism Toxin Injection For Chronic Migraine    Reviewed orally with patient, additionally signature is on file:  Botulism toxin has been approved by the Federal drug administration for treatment of chronic migraine. Botulism toxin does not cure chronic migraine and it may not be effective in some patients.  The administration of botulism toxin is accomplished by injecting a small amount of toxin into the muscles of the neck and head. Dosage must be titrated for each individual. Any benefits resulting from botulism toxin tend to wear off after 3 months with a repeat injection required if benefit is to be maintained. Injections are usually done every 3-4 months with maximum effect peak achieved by about 2 or 3 weeks. Botulism toxin is expensive and you should be sure of what costs you will incur resulting from the injection.  The side effects  of botulism toxin use for chronic migraine may include:   -Transient, and usually mild, facial weakness with facial injections  -Transient, and usually mild, head or neck weakness with head/neck injections  -Reduction or loss of forehead facial animation due to forehead muscle weakness  -Eyelid drooping  -Dry eye  -Pain at the site of injection or bruising at the site of injection  -Double vision  -Potential unknown long term risks   Contraindications: You should not have Botox  if you are pregnant, nursing, allergic to albumin, have an infection, skin condition, or muscle weakness at the site of the injection, or have myasthenia gravis, Lambert-Eaton syndrome, or ALS.  It is also possible that as with any injection, there may be an allergic reaction or no effect from the medication. Reduced effectiveness after repeated injections is sometimes seen and rarely infection at the injection site may occur. All care will be taken to prevent these side effects. If therapy is given over a long time, atrophy and wasting in the muscle injected may occur. Occasionally the patient's become refractory to treatment because they develop antibodies to the toxin. In this event, therapy needs to be modified.  I have read the above information and consent to the administration of botulism toxin.    BOTOX  PROCEDURE NOTE FOR MIGRAINE HEADACHE  Contraindications and precautions discussed with patient(above). Aseptic procedure was observed and patient tolerated procedure. Procedure performed by Johny Nap, AGNP-BC.   The condition has existed for more than 6 months, and pt does not have a diagnosis of ALS, Myasthenia Gravis  or Lambert-Eaton Syndrome.  Risks and benefits of injections discussed and pt agrees to proceed with the procedure.  Written consent obtained  These injections are medically necessary. Pt  receives good benefits from these injections. These injections do not cause sedations or hallucinations  which the oral therapies may cause.   Description of procedure:  The patient was placed in a sitting position. The standard protocol was used for Botox  as follows, with 5 units of Botox  injected at each site:  -Procerus muscle, midline injection  -Corrugator muscle, bilateral injection  -Frontalis muscle, bilateral injection, with 2 sites each side, medial injection was performed in the upper one third of the frontalis muscle, in the region vertical from the medial inferior edge of the superior orbital rim. The lateral injection was again in the upper one third of the forehead vertically above the lateral limbus of the cornea, 1.5 cm lateral to the medial injection site.  -Temporalis muscle injection, 4 sites, bilaterally. The first injection was 3 cm above the tragus of the ear, second injection site was 1.5 cm to 3 cm up from the first injection site in line with the tragus of the ear. The third injection site was 1.5-3 cm forward between the first 2 injection sites. The fourth injection site was 1.5 cm posterior to the second injection site. 5th site laterally in the temporalis  muscleat the level of the outer canthus.  -Occipitalis muscle injection, 3 sites, bilaterally. The first injection was done one half way between the occipital protuberance and the tip of the mastoid process behind the ear. The second injection site was done lateral and superior to the first, 1 fingerbreadth from the first injection. The third injection site was 1 fingerbreadth superiorly and medially from the first injection site.  -Cervical paraspinal muscle injection, 2 sites, bilaterally. The first injection site was 1 cm from the midline of the cervical spine, 3 cm inferior to the lower border of the occipital protuberance. The second injection site was 1.5 cm superiorly and laterally to the first injection site.  -Trapezius muscle injection was performed at 3 sites, bilaterally. The first injection site was in the  upper trapezius muscle halfway between the inflection point of the neck, and the acromion. The second injection site was one half way between the acromion and the first injection site. The third injection was done between the first injection site and the inflection point of the neck.    A total of 200 units of Botox  was prepared, 155 units of Botox  was injected as documented above, any Botox  not injected was wasted. The patient tolerated the procedure well, there were no complications of the above procedure.   Johny Nap, AGNP-BC  [facility] [address] [place], [place] [address]  Phone [phone] Fax [phone] Note: This document was prepared with digital dictation and possible smart phrase technology. Any transcriptional errors that result from this process are unintentional.

## 2023-10-02 NOTE — Addendum Note (Signed)
 Addended by: Burns Carwin on: 10/02/2023 11:16 AM   Modules accepted: Orders

## 2023-10-02 NOTE — Telephone Encounter (Signed)
 Pharmacy Patient Advocate Encounter   Received notification from Patient Advice Request messages that prior authorization for Qulipta  60mg  Tablet is required/requested.   Insurance verification completed.   The patient is insured through [employer] .   Per test claim: The current 30 day co-pay is, $0.  No PA needed at this time. This test claim was processed through [facility]- copay amounts may vary at other pharmacies due to pharmacy/plan contracts, or as the patient moves through the different stages of their insurance plan.

## 2023-10-07 ENCOUNTER — Ambulatory Visit: Admitting: Physical Therapy

## 2023-10-08 ENCOUNTER — Encounter: Payer: Self-pay | Admitting: Emergency Medicine

## 2023-10-14 ENCOUNTER — Other Ambulatory Visit: Payer: Self-pay

## 2023-10-14 DIAGNOSIS — Z87891 Personal history of nicotine dependence: Secondary | ICD-10-CM

## 2023-10-14 DIAGNOSIS — Z122 Encounter for screening for malignant neoplasm of respiratory organs: Secondary | ICD-10-CM

## 2023-10-15 ENCOUNTER — Encounter: Payer: Self-pay | Admitting: Physical Therapy

## 2023-10-15 ENCOUNTER — Ambulatory Visit: Admitting: Physical Therapy

## 2023-10-15 DIAGNOSIS — M25561 Pain in right knee: Secondary | ICD-10-CM | POA: Diagnosis not present

## 2023-10-15 DIAGNOSIS — M6281 Muscle weakness (generalized): Secondary | ICD-10-CM | POA: Diagnosis not present

## 2023-10-15 DIAGNOSIS — M5459 Other low back pain: Secondary | ICD-10-CM

## 2023-10-15 NOTE — Therapy (Signed)
 OUTPATIENT PHYSICAL THERAPY THORACOLUMBAR Treatment  Patient Name: Kyle Walters MRN: [id number] DOB:07/28/1955, 68 y.o., male Today's Date: 10/15/2023   PT End of Session - 10/15/23 1448     Visit Number 2    Number of Visits --   1-2x/week   Date for PT Re-Evaluation 11/20/23    Authorization Type [employer] medicare    Authorization Time Period 09/24/23-11/05/23    Authorization - Visit Number 2    Authorization - Number of Visits 12    Progress Note Due on Visit 10    PT Start Time 0246    PT Stop Time 0324    PT Time Calculation (min) 38 min          Past Medical History:  Diagnosis Date   ADHD (attention deficit hyperactivity disorder)    Allergy    Anemia    Anxiety panic attack   Arthritis    COPD (chronic obstructive pulmonary disease) (HCC)    bronchitis/emphysema   Coronary artery disease    Depression    Emphysema lung (HCC)    Emphysema of lung (HCC)    Enlarged prostate    Fibromyalgia    GERD (gastroesophageal reflux disease)    Hyperlipidemia    Hypertension    IBS (irritable bowel syndrome)    Migraine    Myocardial infarction (HCC)    Panic attack    Past Surgical History:  Procedure Laterality Date   CARDIAC CATHETERIZATION     CORONARY STENT INTERVENTION N/A 03/14/2021   Procedure: CORONARY STENT INTERVENTION;  Surgeon: Dann Candyce RAMAN, MD;  Location: [facility];  Service: Cardiovascular;  Laterality: N/A;   LEFT HEART CATH AND CORONARY ANGIOGRAPHY N/A 03/14/2021   Procedure: LEFT HEART CATH AND CORONARY ANGIOGRAPHY;  Surgeon: Dann Candyce RAMAN, MD;  Location: [facility] INVASIVE CV LAB;  Service: Cardiovascular;  Laterality: N/A;   NO PAST SURGERIES     Patient Active Problem List   Diagnosis Date Noted   Lumbar compression fracture (HCC) 08/26/2023   Abnormal echocardiogram 03/26/2023   S/P coronary artery stent placement 03/26/2023   Claudication (HCC) 03/26/2023   OSA (obstructive sleep apnea) 11/14/2022   Daytime sleepiness  10/23/2022   History of tobacco use 09/07/2021   Thrombus in heart chamber 04/03/2021   Hyperlipidemia 03/14/2021   Coronary artery disease    Chronic migraine without aura, with intractable migraine, so stated, with status migrainosus 07/14/2020   Severe episode of recurrent major depressive disorder, without psychotic features (HCC) 07/14/2020   Polyclonal gammopathy determined by serum protein electrophoresis 02/25/2018   Primary osteoarthritis of both feet 12/13/2017   History of BPH 12/13/2017   Family history of psoriasis in mother 12/13/2017   History of gastroesophageal reflux (GERD) 12/13/2017   Anxiety and depression 12/13/2017   COPD (chronic obstructive pulmonary disease) (HCC) 01/29/2013   CHEST PAIN-PRECORDIAL 11/01/2009   Primary osteoarthritis of both hands 10/12/2009   CHEST PAIN 10/12/2009    PCP: Sim Emery CROME, MD  REFERRING PROVIDER: Sim Emery CROME, MD  THERAPY DIAG:  Other low back pain  REFERRING DIAG: Lumbar compression fracture L1  Rationale for Evaluation and Treatment:  Rehabilitation  SUBJECTIVE:  PERTINENT PAST HISTORY:  asthmatic COPD, superimposed restrictive lung disease, postconcussion syndrome from prior trauma, depression, anxiety, apical LV aneurysm and layered LV thrombus (resolved 07/2021), migraines, polyclonal gammopathy, CAD s/p stent, HLD, fibromyalgia        PRECAUTIONS: None  WEIGHT BEARING RESTRICTIONS No  FALLS:  Has patient fallen in last 6  months? Yes, Number of falls: sleep walking led to fracture  MOI/History of condition:  Onset date: 07/31/2023  SUBJECTIVE STATEMENT   10/15/23: I did the exercises some but the migraines have been bothersome. The knee is slowly getting better and the back too. I ride stationary bike 30-45 minutes when I do not wake up with a migraine.   EVAL [name] is a 68 y.o. male who presents to clinic with chief complaint of low back pain with sciatica.  He fell while sleep walking  in early April resulting in a compression fracture at L1.  He has some radicular sxs of the R LE which is chronic but were exacerbated during the fall.  He has has intermittent n/t in the plantar surface of his R foot.  He is also having some R knee pain and was diagnosed with pes anserine bursitis.  His PCP is managing is compression fracture and he had been in a back brace for about 4 weeks.  He has has been avoiding lifting and twisting since the injury.  From referring provider:  08/26/2023: Today - follow up Patient presents today for follow up. His breathing has been stable since he was here last. He did fall in early April. He had ongoing back pain after this and finally had a MRI with L1 compression fracture. His PCP put him in a back brace last week. Since he's been using the back brace, he feels like he's had a little more trouble with his breathing and activity tolerance  From PCP note 4/14:  Back Pain: Kyle Walters is a 68 y.o. male who presents for evaluation of low back and right hip pain. He notes 10 days ago he had a possible fall leading to falling on his right side however cannot recall exactly what he fell on as it was while he was sleep walking. He did not have pain immediately and went back to sleep. 3-4 days after this he started having pain in the right lower back and right hip area that radiates down to his left knee. He rates the pain a 3/10 at rest majority of time however sometimes goes to 7/10 in intensity.  No prior back pain or injuries recently. Denies bowel or bladder incontinence. Has not been evaluated by anyone for this yet. Requests xrays.  He has tried tylenol  with minimal relief. Symptoms are exacerbated by exercise, flexion, sitting, standing, straining, and twisting. Symptoms are improved by acetaminophen . [name] has also tried nothing which provided no symptom relief. [name] has no other symptoms associated with the back pain. The patient has no red flag  history indicative of complicated back pain.    Red flags:  denies   Pain:  Are you having pain? Yes Pain location: low back pain, R SI NPRS scale:  2/10 to 4/10 Aggravating factors: bending, side bending Relieving factors: rest Pain description: aching Stage: Subacute  Are you having pain? Yes Pain location: R knee, pes anserine  NPRS scale:  2/10 to 5/10 Aggravating factors: walking, squatting Relieving factors: rest Pain description: aching Stage: Subacute    Occupation: na  Assistive Device: na  Hand Dominance: na  Patient Goals/Specific Activities: reduce pain, reduce guarding.    OBJECTIVE:   DIAGNOSTIC FINDINGS:  MRI shows wedge compression fracture L1  GENERAL OBSERVATION/GAIT: Kyphotic, wearing lumbar brace  SENSATION: Light touch: Deficits intermittent plantar surface of R foot    LE MMT:  MMT Right (Eval) Left (Eval)  Hip flexion (L2, L3) 4 4  Knee extension (L3) 4 4  Knee flexion 4 4  Hip abduction    Hip extension    Hip external rotation 4+ 4+  Hip internal rotation 4 4  Hip adduction    Ankle dorsiflexion (L4)    Ankle plantarflexion (S1)    Ankle inversion    Ankle eversion    Great Toe ext (L5)    Grossly     (Blank rows = not tested, score listed is out of 5 possible points.  N = WNL, D = diminished, C = clear for gross weakness with myotome testing, * = concordant pain with testing)  SPECIAL TESTS:  Straight leg raise: L (-), R (-) Slump: L (-), R (-)  MUSCLE LENGTH: Hamstrings: Right significant restriction; Left significant restriction  LE ROM:  ROM Right (Eval) Left (Eval)  Hip flexion    Hip extension    Hip abduction    Hip adduction    Hip internal rotation    Hip external rotation    Knee flexion [facility] WFL  Knee extension [facility]  Ankle dorsiflexion    Ankle plantarflexion    Ankle inversion    Ankle eversion      (Blank rows = not tested, N = WNL, * = concordant pain with testing)  Functional  Tests  Eval                                                                PALPATION:   Mod TTP R pes anserine bursitis  PATIENT SURVEYS:  ODI: 8/50  TODAY'S TREATMENT  [facility] Adult PT Treatment:                                                DATE: 10/15/23 Therapeutic Exercise: Exercises - Supine Bridge  - 1 x daily - 7 x weekly - 3 sets - 5 reps - 2-3 seconds hold - Supine Quad Set  - 2 x daily - 7 x weekly - 2 sets - 10 reps - 10 second hold - Hooklying Isometric Clamshell  - 1 x daily - 7 x weekly - 3 sets - 10 reps - Supine Hip Adduction Isometric with Ball  - 1 x daily - 7 x weekly - 2 sets - 10 reps - 10'' hold -supine SLR 5 x 2 each with ab brace  Updated HEP    EVAL:  Therapeutic Exercise: Creating, reviewing, and completing below HEP  PATIENT EDUCATION ([place]/[facility]):  POC, diagnosis, prognosis, HEP, and outcome measures.  Pt educated via explanation, demonstration, and handout (HEP).  Pt confirms understanding verbally.   HOME EXERCISE PROGRAM: Access Code: [facility] URL: [email or web address] Date: 09/24/2023 Prepared by: Helene Gasmen  Exercises - Supine Bridge  - 1 x daily - 7 x weekly - 3 sets - 5 reps - 2-3 seconds hold - Supine Quad Set  - 2 x daily - 7 x weekly - 2 sets - 10 reps - 10 second hold - Hooklying Isometric Clamshell  - 1 x daily - 7 x weekly - 3 sets - 10 reps - Supine Hip Adduction Isometric with Ball  - 1 x daily -  7 x weekly - 2 sets - 10 reps - 10'' hold 10/15/23 - Active straight leg raise  - 1 x daily - 7 x weekly - 1-2 sets - 10 reps  Treatment priorities   Eval        Concentrate on neutral/light ext based program initially with transition to safe lifting techniques over the next several weeks                                          ASSESSMENT:  CLINICAL IMPRESSION: Pt reports non- compliance with HEP thus far, has had a problem with starting a new routine and is battling migraines. Reviewed HEP today  with min discomfort only with isometric clam. Otherwise he tolerated treatment well. Progressed with SLR and ab draw in to progress core and quad strength. Updated HEP and cautioned him to stop if pain increases. Will assess response and continued neutral spine core stability slowly as tolerated.    EVAL: Laker is a 68 y.o. male who presents to clinic with signs and sxs consistent with sub acute low back pain following fall from bed resulting in compression fracture at L1.  He has a history of R radicular sxs which were exacerbated in the injury and concurrent L knee pain.  He is currently wearing his back brace throughout the day and I encouraged him to continue this for the next few weeks.  We work on lifting techniques as appropriate.  He will benefit from skilled therapy to address relevant deficits and improve function and independence with daily activities such as dressing, bending, lifting, and housework.    OBJECTIVE IMPAIRMENTS: Pain, LE strength, lumbar ROM  ACTIVITY LIMITATIONS: bending, lifting, squatting, housework, self care  PERSONAL FACTORS: See medical history and pertinent history   REHAB POTENTIAL: Good  CLINICAL DECISION MAKING: Evolving/moderate complexity  EVALUATION COMPLEXITY: Moderate   GOALS:   SHORT TERM GOALS: Target date: 10/23/2023   Dellis will be >75% HEP compliant to improve carryover between sessions and facilitate independent management of condition  Evaluation: ongoing Goal status: INITIAL   LONG TERM GOALS: Target date: 11/20/2023   Renzo will self report >/= 50% decrease in low back pain from evaluation to improve function in daily tasks  Evaluation/Baseline: 4/10 max pain Goal status: INITIAL   2.  Gearl will show a >/= 4 pt improvement in their ODI score (MCID is 12% or 6/50 pts) as a proxy for functional improvement   Evaluation/Baseline: 8 pts Goal status: INITIAL   3.  Domenique will be able to lift 10 lbs from the floor  while maintaining neutral spine and place on a 3 foot counter, not limited by pain   Evaluation/Baseline: unable Goal status: INITIAL   4.  Goldman will self report >/= 50% decrease in pain R knee pain from evaluation to improve function in daily tasks  Evaluation/Baseline: 4/10 max pain Goal status: INITIAL   PLAN: PT FREQUENCY: 1-2x/week  PT DURATION: 8 weeks  PLANNED INTERVENTIONS:  [id number]- PT Re-evaluation, 97110-Therapeutic exercises, 97530- Therapeutic activity, 97112- Neuromuscular re-education, 97535- Self Care, [id number]- Manual therapy, [id number]- Gait training, [id number]- Aquatic Therapy, [phone]- Electrical stimulation (manual), [id number]- Vasopneumatic device, [id number]- Traction (mechanical), [id number]- Ionotophoresis 4mg /ml Dexamethasone , Taping, Dry Needling, Joint manipulation, and Spinal manipulation.   Harlene Persons, PTA 10/15/23 3:38 PM Phone: [phone] Fax: [phone]   Date of referral: 5/5 Referring provider: Sim Re Referring diagnosis? wedge  compression fracture of first  lumbar verebra  Treatment diagnosis? (if different than referring diagnosis)   What was this (referring dx) caused by? Felton Hawks of Condition: Initial Onset (within last 3 months)   Laterality: Both  Current Functional Measure Score: Back Index 8/50  Objective measurements identify impairments when they are compared to normal values, the uninvolved extremity, and prior level of function.  [x]  Yes  []  No  Objective assessment of functional ability: Severe functional limitations   Briefly describe symptoms: Healing wedge compression fracture limiting bending and lifting; causing pain  How did symptoms start: fall while sleep walking  Average pain intensity:  Last 24 hours: 4  Past week: 4  How often does the pt experience symptoms? Constantly  How much have the symptoms interfered with usual daily activities? Quite a bit  How has condition changed since care began at this  facility? NA - initial visit  In general, how is the patients overall health? Fair   BACK PAIN (STarT Back Screening Tool) Has pain spread down the leg(s) at some time in the last 2 weeks? y Has there been pain in the shoulder or neck at some time in the last 2 weeks? n Has the pt only walked short distances because of back pain? n Has patient dressed more slowly because of back pain in the past 2 weeks? y Does patient think it's not safe for a person with this condition to be physically active? n Does patient have worrying thoughts a lot of the time? n Does patient feel back pain is terrible and will never get any better? n Has patient stopped enjoying things they usually enjoy? n

## 2023-10-22 ENCOUNTER — Encounter: Payer: Self-pay | Admitting: Physical Therapy

## 2023-10-22 ENCOUNTER — Ambulatory Visit: Attending: Internal Medicine | Admitting: Physical Therapy

## 2023-10-22 DIAGNOSIS — M25561 Pain in right knee: Secondary | ICD-10-CM | POA: Insufficient documentation

## 2023-10-22 DIAGNOSIS — M5459 Other low back pain: Secondary | ICD-10-CM | POA: Insufficient documentation

## 2023-10-22 NOTE — Therapy (Addendum)
 OUTPATIENT PHYSICAL THERAPY THORACOLUMBAR Treatment  PHYSICAL THERAPY DISCHARGE SUMMARY  Visits from Start of Care: 3  Current functional level related to goals / functional outcomes: See goals   Remaining deficits: Current status unknown due to pt not returning.   Education / Equipment: HEP, Theraband   Patient agrees to discharge. Patient goals were not met. Patient is being discharged due to not returning since the last visit. Joneen Fresh PT, DPT, LAT, ATC  01/07/24  8:37 AM         Patient Name: Kyle Walters MRN: [id number] DOB:[birth year], 68 y.o., male Today's Date: 10/22/2023   PT End of Session - 10/22/23 1549     Visit Number 3    Date for PT Re-Evaluation 11/20/23    Authorization Type [employer] medicare    Authorization Time Period 09/24/23-11/05/23    Authorization - Visit Number 3    Authorization - Number of Visits 12    Progress Note Due on Visit 10    PT Start Time 1549    PT Stop Time 1630    PT Time Calculation (min) 41 min           Past Medical History:  Diagnosis Date   ADHD (attention deficit hyperactivity disorder)    Allergy    Anemia    Anxiety panic attack   Arthritis    COPD (chronic obstructive pulmonary disease) (HCC)    bronchitis/emphysema   Coronary artery disease    Depression    Emphysema lung (HCC)    Emphysema of lung (HCC)    Enlarged prostate    Fibromyalgia    GERD (gastroesophageal reflux disease)    Hyperlipidemia    Hypertension    IBS (irritable bowel syndrome)    Migraine    Myocardial infarction (HCC)    Panic attack    Past Surgical History:  Procedure Laterality Date   CARDIAC CATHETERIZATION     CORONARY STENT INTERVENTION N/A 03/14/2021   Procedure: CORONARY STENT INTERVENTION;  Surgeon: Dann Candyce RAMAN, MD;  Location: [facility] INVASIVE CV LAB;  Service: Cardiovascular;  Laterality: N/A;   LEFT HEART CATH AND CORONARY ANGIOGRAPHY N/A 03/14/2021   Procedure: LEFT HEART CATH AND CORONARY  ANGIOGRAPHY;  Surgeon: Dann Candyce RAMAN, MD;  Location: [facility] INVASIVE CV LAB;  Service: Cardiovascular;  Laterality: N/A;   NO PAST SURGERIES     Patient Active Problem List   Diagnosis Date Noted   Lumbar compression fracture (HCC) 08/26/2023   Abnormal echocardiogram 03/26/2023   S/P coronary artery stent placement 03/26/2023   Claudication (HCC) 03/26/2023   OSA (obstructive sleep apnea) 11/14/2022   Daytime sleepiness 10/23/2022   History of tobacco use 09/07/2021   Thrombus in heart chamber 04/03/2021   Hyperlipidemia 03/14/2021   Coronary artery disease    Chronic migraine without aura, with intractable migraine, so stated, with status migrainosus 07/14/2020   Severe episode of recurrent major depressive disorder, without psychotic features (HCC) 07/14/2020   Polyclonal gammopathy determined by serum protein electrophoresis 02/25/2018   Primary osteoarthritis of both feet 12/13/2017   History of BPH 12/13/2017   Family history of psoriasis in mother 12/13/2017   History of gastroesophageal reflux (GERD) 12/13/2017   Anxiety and depression 12/13/2017   COPD (chronic obstructive pulmonary disease) (HCC) 01/29/2013   CHEST PAIN-PRECORDIAL 11/01/2009   Primary osteoarthritis of both hands 10/12/2009   CHEST PAIN 10/12/2009    PCP: Sim Emery CROME, MD  REFERRING PROVIDER: Sim Emery CROME, MD  THERAPY DIAG:  Other low back  pain  Right knee pain, unspecified chronicity  REFERRING DIAG: Lumbar compression fracture L1  Rationale for Evaluation and Treatment:  Rehabilitation  SUBJECTIVE:  PERTINENT PAST HISTORY:  asthmatic COPD, superimposed restrictive lung disease, postconcussion syndrome from prior trauma, depression, anxiety, apical LV aneurysm and layered LV thrombus (resolved 07/2021), migraines, polyclonal gammopathy, CAD s/p stent, HLD, fibromyalgia        PRECAUTIONS: None  WEIGHT BEARING RESTRICTIONS No  FALLS:  Has patient fallen in last 6 months? Yes,  Number of falls: sleep walking led to fracture  MOI/History of condition:  Onset date: 07/31/2023  SUBJECTIVE STATEMENT  10-22-23     Because of the storms and stress I am having more migraines.  Wearing sunglasses. I am having more pain in my feet today. My knee on Right is been having more arthritis. Pain in back 2/10 to 3/10.   10/15/23: I did the exercises some but the migraines have been bothersome. The knee is slowly getting better and the back too. I ride stationary bike 30-45 minutes when I do not wake up with a migraine.   EVAL Kyle Walters is a 68 y.o. male who presents to clinic with chief complaint of low back pain with sciatica.  He fell while sleep walking in early April resulting in a compression fracture at L1.  He has some radicular sxs of the R LE which is chronic but were exacerbated during the fall.  He has has intermittent n/t in the plantar surface of his R foot.  He is also having some R knee pain and was diagnosed with pes anserine bursitis.  His PCP is managing is compression fracture and he had been in a back brace for about 4 weeks.  He has has been avoiding lifting and twisting since the injury.  From referring provider:  08/26/2023: Today - follow up Patient presents today for follow up. His breathing has been stable since he was here last. He did fall in early April. He had ongoing back pain after this and finally had a MRI with L1 compression fracture. His PCP put him in a back brace last week. Since he's been using the back brace, he feels like he's had a little more trouble with his breathing and activity tolerance  From PCP note 4/14:  Back Pain: Kyle Walters is a 68 y.o. male who presents for evaluation of low back and right hip pain. He notes 10 days ago he had a possible fall leading to falling on his right side however cannot recall exactly what he fell on as it was while he was sleep walking. He did not have pain immediately and went back to sleep. 3-4 days  after this he started having pain in the right lower back and right hip area that radiates down to his left knee. He rates the pain a 3/10 at rest majority of time however sometimes goes to 7/10 in intensity.  No prior back pain or injuries recently. Denies bowel or bladder incontinence. Has not been evaluated by anyone for this yet. Requests xrays.  He has tried tylenol  with minimal relief. Symptoms are exacerbated by exercise, flexion, sitting, standing, straining, and twisting. Symptoms are improved by acetaminophen . Ra has also tried nothing which provided no symptom relief. Kevonte has no other symptoms associated with the back pain. The patient has no red flag history indicative of complicated back pain.    Red flags:  denies   Pain:  Are you having pain? Yes Pain location: low back  pain, R SI NPRS scale:  2/10 to 4/10 Aggravating factors: bending, side bending Relieving factors: rest Pain description: aching Stage: Subacute  Are you having pain? Yes Pain location: R knee, pes anserine  NPRS scale:  2/10 to 5/10 Aggravating factors: walking, squatting Relieving factors: rest Pain description: aching Stage: Subacute    Occupation: na  Assistive Device: na  Hand Dominance: na  Patient Goals/Specific Activities: reduce pain, reduce guarding.    OBJECTIVE:   DIAGNOSTIC FINDINGS:  MRI shows wedge compression fracture L1  GENERAL OBSERVATION/GAIT: Kyle Walters, wearing lumbar brace  SENSATION: Light touch: Deficits intermittent plantar surface of R foot    LE MMT:  MMT Right (Eval) Left (Eval)  Hip flexion (L2, L3) 4 4  Knee extension (L3) 4 4  Knee flexion 4 4  Hip abduction    Hip extension    Hip external rotation 4+ 4+  Hip internal rotation 4 4  Hip adduction    Ankle dorsiflexion (L4)    Ankle plantarflexion (S1)    Ankle inversion    Ankle eversion    Great Toe ext (L5)    Grossly     (Blank rows = not tested, score listed is out of 5  possible points.  N = WNL, D = diminished, C = clear for gross weakness with myotome testing, * = concordant pain with testing)  SPECIAL TESTS:  Straight leg raise: L (-), R (-) Slump: L (-), R (-)  MUSCLE LENGTH: Hamstrings: Right significant restriction; Left significant restriction  LE ROM:  ROM Right (Eval) Left (Eval)  Hip flexion    Hip extension    Hip abduction    Hip adduction    Hip internal rotation    Hip external rotation    Knee flexion [facility] WFL  Knee extension [facility]  Ankle dorsiflexion    Ankle plantarflexion    Ankle inversion    Ankle eversion      (Blank rows = not tested, N = WNL, * = concordant pain with testing)  Functional Tests  Eval                                                                PALPATION:   Mod TTP R pes anserine bursitis  PATIENT SURVEYS:  ODI: 8/50  TODAY'S TREATMENT  [facility] Adult PT Treatment:                                                DATE: 10-22-23 Therapeutic Exercise: Supine bridge with ball with abdominal engagement and segmental lowering  of back 2 x 10 SAQ with ball under knee on R and L  3 x 10 each leg Quad set x 25 with towel at ankle on each knee Hooklying adduction with ball x 25 Hooklying isometric clamshell with GTB Supine SLR with abd bracing and stretch out strap eccentric lowering  [facility] Adult PT Treatment:  DATE: 10/15/23 Therapeutic Exercise: Exercises - Supine Bridge  - 1 x daily - 7 x weekly - 3 sets - 5 reps - 2-3 seconds hold - Supine Quad Set  - 2 x daily - 7 x weekly - 2 sets - 10 reps - 10 second hold - Hooklying Isometric Clamshell  - 1 x daily - 7 x weekly - 3 sets - 10 reps - Supine Hip Adduction Isometric with Ball  - 1 x daily - 7 x weekly - 2 sets - 10 reps - 10'' hold -supine SLR 5 x 2 each with ab brace  Updated HEP    EVAL:  Therapeutic Exercise: Creating, reviewing, and completing below HEP  PATIENT EDUCATION  ([place]/[facility]):  POC, diagnosis, prognosis, HEP, and outcome measures.  Pt educated via explanation, demonstration, and handout (HEP).  Pt confirms understanding verbally.   HOME EXERCISE PROGRAM: Access Code: [facility] URL: [email or web address] Date: 09/24/2023 Prepared by: Helene Gasmen  Exercises - Supine Bridge  - 1 x daily - 7 x weekly - 3 sets - 5 reps - 2-3 seconds hold - Supine Quad Set  - 2 x daily - 7 x weekly - 2 sets - 10 reps - 10 second hold - Hooklying Isometric Clamshell  - 1 x daily - 7 x weekly - 3 sets - 10 reps - Supine Hip Adduction Isometric with Ball  - 1 x daily - 7 x weekly - 2 sets - 10 reps - 10'' hold 10/15/23 - Active straight leg raise  - 1 x daily - 7 x weekly - 1-2 sets - 10 reps Added 10-22-23 - Supine Knee Extension Strengthening  - 1 x daily - 7 x weekly - 3 sets - 10 reps    Eval        Concentrate on neutral/light ext based program initially with transition to safe lifting techniques over the next several weeks                                          ASSESSMENT:  CLINICAL IMPRESSION: Pt reports performing HEP maybe 3-4 times since last in clinic. Added SAQ to HEP  and reinforced HEP.thus far,as pt is also having migraines and wearing glasses. Reviewed HEP today with min discomfort only with isometric clam. Otherwise he tolerated treatment well. Progressed with SLR and ab draw in to progress core and quad strength. Will assess response and continued neutral spine core stability slowly as tolerated.    EVAL: Kyle Walters is a 68 y.o. male who presents to clinic with signs and sxs consistent with sub acute low back pain following fall from bed resulting in compression fracture at L1.  He has a history of R radicular sxs which were exacerbated in the injury and concurrent L knee pain.  He is currently wearing his back brace throughout the day and I encouraged him to continue this for the next few weeks.  We work on lifting techniques as  appropriate.  He will benefit from skilled therapy to address relevant deficits and improve function and independence with daily activities such as dressing, bending, lifting, and housework.    OBJECTIVE IMPAIRMENTS: Pain, LE strength, lumbar ROM  ACTIVITY LIMITATIONS: bending, lifting, squatting, housework, self care  PERSONAL FACTORS: See medical history and pertinent history   REHAB POTENTIAL: Good  CLINICAL DECISION MAKING: Evolving/moderate complexity  EVALUATION COMPLEXITY: Moderate   GOALS:   SHORT  TERM GOALS: Target date: 10/23/2023   Kyle Walters will be >75% HEP compliant to improve carryover between sessions and facilitate independent management of condition  Evaluation: ongoing Goal status: ONGOING   LONG TERM GOALS: Target date: 11/20/2023   Kyle Walters will self report >/= 50% decrease in low back pain from evaluation to improve function in daily tasks  Evaluation/Baseline: 4/10 max pain Goal status: INITIAL   2.  Kyle Walters will show a >/= 4 pt improvement in their ODI score (MCID is 12% or 6/50 pts) as a proxy for functional improvement   Evaluation/Baseline: 8 pts Goal status: INITIAL   3.  Kyle Walters will be able to lift 10 lbs from the floor while maintaining neutral spine and place on a 3 foot counter, not limited by pain   Evaluation/Baseline: unable Goal status: INITIAL   4.  Kyle Walters will self report >/= 50% decrease in pain R knee pain from evaluation to improve function in daily tasks  Evaluation/Baseline: 4/10 max pain Goal status: INITIAL   PLAN: PT FREQUENCY: 1-2x/week  PT DURATION: 8 weeks  PLANNED INTERVENTIONS:  [id number]- PT Re-evaluation, 97110-Therapeutic exercises, 97530- Therapeutic activity, 97112- Neuromuscular re-education, 97535- Self Care, [id number]- Manual therapy, [id number]- Gait training, [id number]- Aquatic Therapy, [phone]- Electrical stimulation (manual), [id number]- Vasopneumatic device, [id number]- Traction (mechanical), [id number]- Ionotophoresis 4mg /ml  Dexamethasone , Taping, Dry Needling, Joint manipulation, and Spinal manipulation.   Kyle Walters, PT, ATRIC Certified Exercise Expert for the Aging Adult  10/22/23 4:38 PM Phone: [phone] Fax: [phone]   Date of referral: 5/5 Referring provider: Sim Re Referring diagnosis? wedge compression fracture of first  lumbar verebra  Treatment diagnosis? (if different than referring diagnosis)   What was this (referring dx) caused by? Kyle Walters of Condition: Initial Onset (within last 3 months)   Laterality: Both  Current Functional Measure Score: Back Index 8/50  Objective measurements identify impairments when they are compared to normal values, the uninvolved extremity, and prior level of function.  [x]  Yes  []  No  Objective assessment of functional ability: Severe functional limitations   Briefly describe symptoms: Healing wedge compression fracture limiting bending and lifting; causing pain  How did symptoms start: fall while sleep walking  Average pain intensity:  Last 24 hours: 4  Past week: 4  How often does the pt experience symptoms? Constantly  How much have the symptoms interfered with usual daily activities? Quite a bit  How has condition changed since care began at this facility? NA - initial visit  In general, how is the patients overall health? Fair   BACK PAIN (STarT Back Screening Tool) Has pain spread down the leg(s) at some time in the last 2 weeks? y Has there been pain in the shoulder or neck at some time in the last 2 weeks? n Has the pt only walked short distances because of back pain? n Has patient dressed more slowly because of back pain in the past 2 weeks? y Does patient think it's not safe for a person with this condition to be physically active? n Does patient have worrying thoughts a lot of the time? n Does patient feel back pain is terrible and will never get any better? n Has patient stopped enjoying things they  usually enjoy? n

## 2023-10-29 ENCOUNTER — Ambulatory Visit (HOSPITAL_COMMUNITY)
Admission: RE | Admit: 2023-10-29 | Discharge: 2023-10-29 | Disposition: A | Discharge status: Home / Self Care | Source: Ambulatory Visit | Attending: Cardiology | Admitting: Cardiology

## 2023-10-29 DIAGNOSIS — I513 Intracardiac thrombosis, not elsewhere classified: Secondary | ICD-10-CM | POA: Insufficient documentation

## 2023-10-29 LAB — ECHOCARDIOGRAM COMPLETE
Area-P 1/2: 3.08 cm2
S' Lateral: 2.2 cm

## 2023-10-29 MED ORDER — PERFLUTREN LIPID MICROSPHERE
1.0000 mL | INTRAVENOUS | Status: AC | PRN
  Administered 2023-10-29: 3 mL via INTRAVENOUS

## 2023-10-30 NOTE — Therapy (Incomplete)
 OUTPATIENT PHYSICAL THERAPY THORACOLUMBAR Treatment  Patient Name: Kyle Walters MRN: [id number] DOB:Nov 09, 1955, 68 y.o., male Today's Date: 10/30/2023      Past Medical History:  Diagnosis Date   ADHD (attention deficit hyperactivity disorder)    Allergy    Anemia    Anxiety panic attack   Arthritis    COPD (chronic obstructive pulmonary disease) (HCC)    bronchitis/emphysema   Coronary artery disease    Depression    Emphysema lung (HCC)    Emphysema of lung (HCC)    Enlarged prostate    Fibromyalgia    GERD (gastroesophageal reflux disease)    Hyperlipidemia    Hypertension    IBS (irritable bowel syndrome)    Migraine    Myocardial infarction (HCC)    Panic attack    Past Surgical History:  Procedure Laterality Date   CARDIAC CATHETERIZATION     CORONARY STENT INTERVENTION N/A 03/14/2021   Procedure: CORONARY STENT INTERVENTION;  Surgeon: Kyle Candyce RAMAN, MD;  Location: [facility] INVASIVE CV LAB;  Service: Cardiovascular;  Laterality: N/A;   LEFT HEART CATH AND CORONARY ANGIOGRAPHY N/A 03/14/2021   Procedure: LEFT HEART CATH AND CORONARY ANGIOGRAPHY;  Surgeon: Kyle Candyce RAMAN, MD;  Location: [facility] INVASIVE CV LAB;  Service: Cardiovascular;  Laterality: N/A;   NO PAST SURGERIES     Patient Active Problem List   Diagnosis Date Noted   Lumbar compression fracture (HCC) 08/26/2023   Abnormal echocardiogram 03/26/2023   S/P coronary artery stent placement 03/26/2023   Claudication (HCC) 03/26/2023   OSA (obstructive sleep apnea) 11/14/2022   Daytime sleepiness 10/23/2022   History of tobacco use 09/07/2021   Thrombus in heart chamber 04/03/2021   Hyperlipidemia 03/14/2021   Coronary artery disease    Chronic migraine without aura, with intractable migraine, so stated, with status migrainosus 07/14/2020   Severe episode of recurrent major depressive disorder, without psychotic features (HCC) 07/14/2020   Polyclonal gammopathy determined by serum protein  electrophoresis 02/25/2018   Primary osteoarthritis of both feet 12/13/2017   History of BPH 12/13/2017   Family history of psoriasis in mother 12/13/2017   History of gastroesophageal reflux (GERD) 12/13/2017   Anxiety and depression 12/13/2017   COPD (chronic obstructive pulmonary disease) (HCC) 01/29/2013   CHEST PAIN-PRECORDIAL 11/01/2009   Primary osteoarthritis of both hands 10/12/2009   CHEST PAIN 10/12/2009    PCP: Kyle Emery CROME, MD  REFERRING PROVIDER: Sim Emery CROME, MD  THERAPY DIAG:  No diagnosis found.  REFERRING DIAG: Lumbar compression fracture L1  Rationale for Evaluation and Treatment:  Rehabilitation  SUBJECTIVE:  PERTINENT PAST HISTORY:  asthmatic COPD, superimposed restrictive lung disease, postconcussion syndrome from prior trauma, depression, anxiety, apical LV aneurysm and layered LV thrombus (resolved 07/2021), migraines, polyclonal gammopathy, CAD s/p stent, HLD, fibromyalgia        PRECAUTIONS: None  WEIGHT BEARING RESTRICTIONS No  FALLS:  Has patient fallen in last 6 months? Yes, Number of falls: sleep walking led to fracture  MOI/History of condition:  Onset date: 07/31/2023  SUBJECTIVE STATEMENT  10-22-23     Because of the storms and stress I am having more migraines.  Wearing sunglasses. I am having more pain in my feet today. My knee on Right is been having more arthritis. Pain in back 2/10 to 3/10.   10/15/23: I did the exercises some but the migraines have been bothersome. The knee is slowly getting better and the back too. I ride stationary bike 30-45 minutes when I do not wake up  with a migraine.   EVAL Kyle Walters is a 68 y.o. male who presents to clinic with chief complaint of low back pain with sciatica.  He fell while sleep walking in early April resulting in a compression fracture at L1.  He has some radicular sxs of the R LE which is chronic but were exacerbated during the fall.  He has has intermittent n/t in the plantar  surface of his R foot.  He is also having some R knee pain and was diagnosed with pes anserine bursitis.  His PCP is managing is compression fracture and he had been in a back brace for about 4 weeks.  He has has been avoiding lifting and twisting since the injury.  From referring provider:  08/26/2023: Today - follow up Patient presents today for follow up. His breathing has been stable since he was here last. He did fall in early April. He had ongoing back pain after this and finally had a MRI with L1 compression fracture. His PCP put him in a back brace last week. Since he's been using the back brace, he feels like he's had a little more trouble with his breathing and activity tolerance  From PCP note 4/14:  Back Pain: Kyle Walters is a 68 y.o. male who presents for evaluation of low back and right hip pain. He notes 10 days ago he had a possible fall leading to falling on his right side however cannot recall exactly what he fell on as it was while he was sleep walking. He did not have pain immediately and went back to sleep. 3-4 days after this he started having pain in the right lower back and right hip area that radiates down to his left knee. He rates the pain a 3/10 at rest majority of time however sometimes goes to 7/10 in intensity.  No prior back pain or injuries recently. Denies bowel or bladder incontinence. Has not been evaluated by anyone for this yet. Requests xrays.  He has tried tylenol  with minimal relief. Symptoms are exacerbated by exercise, flexion, sitting, standing, straining, and twisting. Symptoms are improved by acetaminophen . Kyle Walters has also tried nothing which provided no symptom relief. Kyle Walters has no other symptoms associated with the back pain. The patient has no red flag history indicative of complicated back pain.    Red flags:  denies   Pain:  Are you having pain? Yes Pain location: low back pain, R SI NPRS scale:  2/10 to 4/10 Aggravating factors:  bending, side bending Relieving factors: rest Pain description: aching Stage: Subacute  Are you having pain? Yes Pain location: R knee, pes anserine  NPRS scale:  2/10 to 5/10 Aggravating factors: walking, squatting Relieving factors: rest Pain description: aching Stage: Subacute    Occupation: na  Assistive Device: na  Hand Dominance: na  Patient Goals/Specific Activities: reduce pain, reduce guarding.    OBJECTIVE:   DIAGNOSTIC FINDINGS:  MRI shows wedge compression fracture L1  GENERAL OBSERVATION/GAIT: Kyphotic, wearing lumbar brace  SENSATION: Light touch: Deficits intermittent plantar surface of R foot    LE MMT:  MMT Right (Eval) Left (Eval)  Hip flexion (L2, L3) 4 4  Knee extension (L3) 4 4  Knee flexion 4 4  Hip abduction    Hip extension    Hip external rotation 4+ 4+  Hip internal rotation 4 4  Hip adduction    Ankle dorsiflexion (L4)    Ankle plantarflexion (S1)    Ankle inversion    Ankle eversion  Great Toe ext (L5)    Grossly     (Blank rows = not tested, score listed is out of 5 possible points.  N = WNL, D = diminished, C = clear for gross weakness with myotome testing, * = concordant pain with testing)  SPECIAL TESTS:  Straight leg raise: L (-), R (-) Slump: L (-), R (-)  MUSCLE LENGTH: Hamstrings: Right significant restriction; Left significant restriction  LE ROM:  ROM Right (Eval) Left (Eval)  Hip flexion    Hip extension    Hip abduction    Hip adduction    Hip internal rotation    Hip external rotation    Knee flexion [facility] WFL  Knee extension [facility]  Ankle dorsiflexion    Ankle plantarflexion    Ankle inversion    Ankle eversion      (Blank rows = not tested, N = WNL, * = concordant pain with testing)  Functional Tests  Eval                                                                PALPATION:   Mod TTP R pes anserine bursitis  PATIENT SURVEYS:  ODI: 8/50  TODAY'S TREATMENT    [facility] Adult PT Treatment:                                                DATE: *** Goals  Therapeutic Exercise: *** Manual Therapy: *** Neuromuscular re-ed: *** Therapeutic Activity: *** Modalities: *** Self Care: ***  [facility] Adult PT Treatment:                                                DATE: 10-22-23 Therapeutic Exercise: Supine bridge with ball with abdominal engagement and segmental lowering  of back 2 x 10 SAQ with ball under knee on R and L  3 x 10 each leg Quad set x 25 with towel at ankle on each knee Hooklying adduction with ball x 25 Hooklying isometric clamshell with GTB Supine SLR with abd bracing and stretch out strap eccentric lowering  [facility] Adult PT Treatment:                                                DATE: 10/15/23 Therapeutic Exercise: Exercises - Supine Bridge  - 1 x daily - 7 x weekly - 3 sets - 5 reps - 2-3 seconds hold - Supine Quad Set  - 2 x daily - 7 x weekly - 2 sets - 10 reps - 10 second hold - Hooklying Isometric Clamshell  - 1 x daily - 7 x weekly - 3 sets - 10 reps - Supine Hip Adduction Isometric with Ball  - 1 x daily - 7 x weekly - 2 sets - 10 reps - 10'' hold -supine SLR 5 x 2 each with ab brace  Updated HEP  EVAL:  Therapeutic Exercise: Creating, reviewing, and completing below HEP  PATIENT EDUCATION ([place]/[facility]):  POC, diagnosis, prognosis, HEP, and outcome measures.  Pt educated via explanation, demonstration, and handout (HEP).  Pt confirms understanding verbally.   HOME EXERCISE PROGRAM: Access Code: [facility] URL: [email or web address] Date: 09/24/2023 Prepared by: Helene Gasmen  Exercises - Supine Bridge  - 1 x daily - 7 x weekly - 3 sets - 5 reps - 2-3 seconds hold - Supine Quad Set  - 2 x daily - 7 x weekly - 2 sets - 10 reps - 10 second hold - Hooklying Isometric Clamshell  - 1 x daily - 7 x weekly - 3 sets - 10 reps - Supine Hip Adduction Isometric with Ball  - 1 x daily - 7 x weekly - 2 sets - 10  reps - 10'' hold 10/15/23 - Active straight leg raise  - 1 x daily - 7 x weekly - 1-2 sets - 10 reps Added 10-22-23 - Supine Knee Extension Strengthening  - 1 x daily - 7 x weekly - 3 sets - 10 reps    Eval        Concentrate on neutral/light ext based program initially with transition to safe lifting techniques over the next several weeks                                          ASSESSMENT:  CLINICAL IMPRESSION: Pt reports performing HEP maybe 3-4 times since last in clinic. Added SAQ to HEP  and reinforced HEP.thus far,as pt is also having migraines and wearing glasses. Reviewed HEP today with min discomfort only with isometric clam. Otherwise he tolerated treatment well. Progressed with SLR and ab draw in to progress core and quad strength. Will assess response and continued neutral spine core stability slowly as tolerated.    EVAL: Jaeson is a 68 y.o. male who presents to clinic with signs and sxs consistent with sub acute low back pain following fall from bed resulting in compression fracture at L1.  He has a history of R radicular sxs which were exacerbated in the injury and concurrent L knee pain.  He is currently wearing his back brace throughout the day and I encouraged him to continue this for the next few weeks.  We work on lifting techniques as appropriate.  He will benefit from skilled therapy to address relevant deficits and improve function and independence with daily activities such as dressing, bending, lifting, and housework.    OBJECTIVE IMPAIRMENTS: Pain, LE strength, lumbar ROM  ACTIVITY LIMITATIONS: bending, lifting, squatting, housework, self care  PERSONAL FACTORS: See medical history and pertinent history   REHAB POTENTIAL: Good  CLINICAL DECISION MAKING: Evolving/moderate complexity  EVALUATION COMPLEXITY: Moderate   GOALS:   SHORT TERM GOALS: Target date: 10/23/2023   Claude will be >75% HEP compliant to improve carryover between sessions and  facilitate independent management of condition  Evaluation: ongoing Goal status: ONGOING   LONG TERM GOALS: Target date: 11/20/2023   Brook will self report >/= 50% decrease in low back pain from evaluation to improve function in daily tasks  Evaluation/Baseline: 4/10 max pain Goal status: INITIAL   2.  Daejon will show a >/= 4 pt improvement in their ODI score (MCID is 12% or 6/50 pts) as a proxy for functional improvement   Evaluation/Baseline: 8 pts Goal status: INITIAL   3.  Gabreil will  be able to lift 10 lbs from the floor while maintaining neutral spine and place on a 3 foot counter, not limited by pain   Evaluation/Baseline: unable Goal status: INITIAL   4.  Issaih will self report >/= 50% decrease in pain R knee pain from evaluation to improve function in daily tasks  Evaluation/Baseline: 4/10 max pain Goal status: INITIAL   PLAN: PT FREQUENCY: 1-2x/week  PT DURATION: 8 weeks  PLANNED INTERVENTIONS:  [id number]- PT Re-evaluation, 97110-Therapeutic exercises, 97530- Therapeutic activity, 97112- Neuromuscular re-education, 97535- Self Care, 02859- Manual therapy, [id number]- Gait training, [id number]- Aquatic Therapy, [phone]- Electrical stimulation (manual), [id number]- Vasopneumatic device, [id number]- Traction (mechanical), [id number]- Ionotophoresis 4mg /ml Dexamethasone , Taping, Dry Needling, Joint manipulation, and Spinal manipulation.   ***  Date of referral: 5/5 Referring provider: Sim Re Referring diagnosis? wedge compression fracture of first  lumbar verebra  Treatment diagnosis? (if different than referring diagnosis)   What was this (referring dx) caused by? Felton Hawks of Condition: Initial Onset (within last 3 months)   Laterality: Both  Current Functional Measure Score: Back Index 8/50  Objective measurements identify impairments when they are compared to normal values, the uninvolved extremity, and prior level of function.  [x]  Yes  []  No  Objective  assessment of functional ability: Severe functional limitations   Briefly describe symptoms: Healing wedge compression fracture limiting bending and lifting; causing pain  How did symptoms start: fall while sleep walking  Average pain intensity:  Last 24 hours: 4  Past week: 4  How often does the pt experience symptoms? Constantly  How much have the symptoms interfered with usual daily activities? Quite a bit  How has condition changed since care began at this facility? NA - initial visit  In general, how is the patients overall health? Fair   BACK PAIN (STarT Back Screening Tool) Has pain spread down the leg(s) at some time in the last 2 weeks? y Has there been pain in the shoulder or neck at some time in the last 2 weeks? n Has the pt only walked short distances because of back pain? n Has patient dressed more slowly because of back pain in the past 2 weeks? y Does patient think it's not safe for a person with this condition to be physically active? n Does patient have worrying thoughts a lot of the time? n Does patient feel back pain is terrible and will never get any better? n Has patient stopped enjoying things they usually enjoy? n

## 2023-10-31 ENCOUNTER — Ambulatory Visit: Payer: Self-pay | Admitting: Cardiology

## 2023-10-31 ENCOUNTER — Ambulatory Visit: Admitting: Physical Therapy

## 2023-10-31 NOTE — Progress Notes (Signed)
 Could you tell if there is LV thrombus in this study?  Thank you Kyle Walters

## 2023-11-03 ENCOUNTER — Encounter: Payer: Self-pay | Admitting: Cardiology

## 2023-11-04 ENCOUNTER — Other Ambulatory Visit: Payer: Self-pay

## 2023-11-04 MED ORDER — ROSUVASTATIN CALCIUM 20 MG PO TABS: 20.0000 mg | ORAL_TABLET | Freq: Every day | ORAL | 2 refills | Status: AC

## 2023-11-04 NOTE — Telephone Encounter (Signed)
 Please review and advise.

## 2023-11-05 ENCOUNTER — Ambulatory Visit: Admitting: Physical Therapy

## 2023-11-05 NOTE — Progress Notes (Signed)
 This echocardiogram does not show LV thrombus. He has completed 3 months on Eliquis  since his MRI in ;ate March. Reasonable to discontinue Eliquis  and start Plavix  75 mg daily given h/o CAD.  Thanks MJP

## 2023-11-05 NOTE — Progress Notes (Signed)
 Please disregard, I see your comment in the comclusion  Thanks MJP

## 2023-11-10 ENCOUNTER — Other Ambulatory Visit: Payer: Self-pay | Admitting: Adult Health

## 2023-11-11 ENCOUNTER — Other Ambulatory Visit (HOSPITAL_COMMUNITY)

## 2023-11-12 ENCOUNTER — Ambulatory Visit: Admitting: Physical Therapy

## 2023-11-12 MED ORDER — CLOPIDOGREL BISULFATE 75 MG PO TABS: 75.0000 mg | ORAL_TABLET | Freq: Every day | ORAL | 2 refills | Status: DC

## 2023-11-12 NOTE — Telephone Encounter (Signed)
 Patient is returning call.

## 2023-11-16 ENCOUNTER — Other Ambulatory Visit: Payer: Self-pay | Admitting: Cardiology

## 2023-11-19 DIAGNOSIS — D492 Neoplasm of unspecified behavior of bone, soft tissue, and skin: Secondary | ICD-10-CM | POA: Diagnosis not present

## 2023-11-19 DIAGNOSIS — L538 Other specified erythematous conditions: Secondary | ICD-10-CM | POA: Diagnosis not present

## 2023-11-19 DIAGNOSIS — L821 Other seborrheic keratosis: Secondary | ICD-10-CM | POA: Diagnosis not present

## 2023-11-25 DIAGNOSIS — J358 Other chronic diseases of tonsils and adenoids: Secondary | ICD-10-CM | POA: Diagnosis not present

## 2023-11-25 DIAGNOSIS — R590 Localized enlarged lymph nodes: Secondary | ICD-10-CM | POA: Diagnosis not present

## 2023-12-10 ENCOUNTER — Encounter: Payer: Self-pay | Admitting: Cardiology

## 2023-12-10 NOTE — Telephone Encounter (Signed)
 Submitted auth renewal via CMM, status is pending.  Key: [id number]

## 2023-12-11 NOTE — Telephone Encounter (Signed)
 Received approval via CMM, pt will continue to fill through [facility]. Auth#: [id number] (12/09/23-03/11/24)

## 2023-12-19 ENCOUNTER — Encounter (HOSPITAL_COMMUNITY): Payer: Self-pay

## 2023-12-19 ENCOUNTER — Other Ambulatory Visit: Payer: Self-pay

## 2023-12-19 ENCOUNTER — Emergency Department (HOSPITAL_COMMUNITY)
Admission: EM | Admit: 2023-12-19 | Discharge: 2023-12-19 | Discharge status: Against Medical Advice | Attending: Emergency Medicine | Admitting: Emergency Medicine

## 2023-12-19 DIAGNOSIS — Z5321 Procedure and treatment not carried out due to patient leaving prior to being seen by health care provider: Secondary | ICD-10-CM | POA: Insufficient documentation

## 2023-12-19 DIAGNOSIS — R109 Unspecified abdominal pain: Secondary | ICD-10-CM | POA: Diagnosis not present

## 2023-12-19 DIAGNOSIS — R110 Nausea: Secondary | ICD-10-CM | POA: Insufficient documentation

## 2023-12-19 LAB — URINALYSIS, ROUTINE W REFLEX MICROSCOPIC
Bacteria, UA: NONE SEEN
Bilirubin Urine: NEGATIVE
Glucose, UA: NEGATIVE mg/dL
Ketones, ur: NEGATIVE mg/dL
Leukocytes,Ua: NEGATIVE
Nitrite: NEGATIVE
Protein, ur: NEGATIVE mg/dL
Specific Gravity, Urine: 1.005 (ref 1.005–1.030)
pH: 8 (ref 5.0–8.0)

## 2023-12-19 LAB — CBC
HCT: 45.1 % (ref 39.0–52.0)
Hemoglobin: 14.5 g/dL (ref 13.0–17.0)
MCH: 30.4 pg (ref 26.0–34.0)
MCHC: 32.2 g/dL (ref 30.0–36.0)
MCV: 94.5 fL (ref 80.0–100.0)
Platelets: 182 K/uL (ref 150–400)
RBC: 4.77 MIL/uL (ref 4.22–5.81)
RDW: 13.2 % (ref 11.5–15.5)
WBC: 5.5 K/uL (ref 4.0–10.5)
nRBC: 0 % (ref 0.0–0.2)

## 2023-12-19 LAB — COMPREHENSIVE METABOLIC PANEL WITH GFR
ALT: 58 U/L — ABNORMAL HIGH (ref 0–44)
AST: 41 U/L (ref 15–41)
Albumin: 4 g/dL (ref 3.5–5.0)
Alkaline Phosphatase: 96 U/L (ref 38–126)
Anion gap: 13 (ref 5–15)
BUN: 10 mg/dL (ref 8–23)
CO2: 26 mmol/L (ref 22–32)
Calcium: 9.5 mg/dL (ref 8.9–10.3)
Chloride: 101 mmol/L (ref 98–111)
Creatinine, Ser: 0.91 mg/dL (ref 0.61–1.24)
GFR, Estimated: >60 mL/min (ref >60)
Glucose, Bld: 83 mg/dL (ref 70–99)
Potassium: 3.7 mmol/L (ref 3.5–5.1)
Sodium: 140 mmol/L (ref 135–145)
Total Bilirubin: 0.6 mg/dL (ref 0.0–1.2)
Total Protein: 6.9 g/dL (ref 6.5–8.1)

## 2023-12-19 LAB — LIPASE, BLOOD: Lipase: 15 U/L (ref 11–51)

## 2023-12-19 NOTE — ED Triage Notes (Signed)
 Pt presents to ED from home C/O abdominal pain X 2-3 weeks. Pt endorses nausea, denies vomiting or diarrhea.

## 2023-12-19 NOTE — ED Notes (Signed)
 Pt is tired of the wait and has stated he is leaving

## 2023-12-24 DIAGNOSIS — K219 Gastro-esophageal reflux disease without esophagitis: Secondary | ICD-10-CM | POA: Diagnosis not present

## 2023-12-24 DIAGNOSIS — R1084 Generalized abdominal pain: Secondary | ICD-10-CM | POA: Diagnosis not present

## 2023-12-24 DIAGNOSIS — R1310 Dysphagia, unspecified: Secondary | ICD-10-CM | POA: Diagnosis not present

## 2023-12-25 ENCOUNTER — Telehealth: Payer: Self-pay

## 2023-12-25 DIAGNOSIS — G43711 Chronic migraine without aura, intractable, with status migrainosus: Secondary | ICD-10-CM

## 2023-12-26 ENCOUNTER — Telehealth: Payer: Self-pay | Admitting: *Deleted

## 2023-12-26 DIAGNOSIS — H43813 Vitreous degeneration, bilateral: Secondary | ICD-10-CM | POA: Diagnosis not present

## 2023-12-26 DIAGNOSIS — H52223 Regular astigmatism, bilateral: Secondary | ICD-10-CM | POA: Diagnosis not present

## 2023-12-26 DIAGNOSIS — H31002 Unspecified chorioretinal scars, left eye: Secondary | ICD-10-CM | POA: Diagnosis not present

## 2023-12-26 DIAGNOSIS — H524 Presbyopia: Secondary | ICD-10-CM | POA: Diagnosis not present

## 2023-12-26 DIAGNOSIS — H5213 Myopia, bilateral: Secondary | ICD-10-CM | POA: Diagnosis not present

## 2023-12-26 DIAGNOSIS — H2513 Age-related nuclear cataract, bilateral: Secondary | ICD-10-CM | POA: Diagnosis not present

## 2023-12-26 NOTE — Progress Notes (Unsigned)
 Complex Care Management Note Care Guide Note  12/26/2023 Name: Kyle Walters MRN: [id number] DOB: 1955/06/17   Complex Care Management Outreach Attempts: An unsuccessful telephone outreach was attempted today to offer the patient information about available complex care management services.  Follow Up Plan:  Additional outreach attempts will be made to offer the patient complex care management information and services.   Encounter Outcome:  No Answer Harlene Satterfield  [facility]  [facility], [facility] Guide  Direct Dial: [phone]  Fax [phone]

## 2023-12-27 NOTE — Progress Notes (Signed)
 Complex Care Management Note  Care Guide Note 12/27/2023 Name: Kyle Walters MRN: [id number] DOB: [birth year]  Kyle Walters is a 68 y.o. year old male who sees Sim, Emery CROME, MD for primary care. I reached out to Kyle Walters by phone today to offer complex care management services.  Kyle Walters was given information about Complex Care Management services today including:   The Complex Care Management services include support from the care team which includes your Nurse Care Manager, Clinical Social Worker, or Pharmacist.  The Complex Care Management team is here to help remove barriers to the health concerns and goals most important to you. Complex Care Management services are voluntary, and the patient may decline or stop services at any time by request to their care team member.   Complex Care Management Consent Status: Patient did not agree to participate in complex care management services at this time.  Follow up plan:  None  Encounter Outcome:  Patient Refused  Harlene Satterfield  [facility]  [facility], [facility] Guide  Direct Dial: [phone]  Fax [phone]

## 2023-12-29 ENCOUNTER — Encounter: Payer: Self-pay | Admitting: Cardiology

## 2024-01-06 ENCOUNTER — Telehealth: Payer: Self-pay | Admitting: Adult Health

## 2024-01-06 ENCOUNTER — Ambulatory Visit: Admitting: Adult Health

## 2024-01-06 NOTE — Progress Notes (Deleted)
 04/28/23: Last month only had 3 migraines but in October had 14. Denies any new medical issue other than cardiology stopped warfarin which was initially started for blood clot in aorta (2.5 yeas ago).  Uses ubrevly. Triptans conintradicated due to previous history MI.  Reports that he did not have an increase in headaches since we delayed Botox  for 16 weeks instead of the typical 12 weeks.  He would like to continue pushing Botox  out to see if he can eventually stop the injections.  Next follow-up will be in 20 weeks.   01/07/23: reports that he only had 2 migraines last month.  He would like to space out his Botox  to see if he still needs it.  We will do his next Botox  injections in 16 weeks.  If he has not had any breakthrough headaches may consider pushing it out further  10/15/22: Botox  works well. Has gone over two weeks recently without a migraine. However today he does have a migraine. Wants to proceed with injections today.   07/04/22: Botox  continues to work well for him.  He states that last month he had more headaches than typical.    1213/23: Saw dr. Ines in November to discussed meds. Referral to academic center was mentioned.  Reports starting Emgality  has helped  01/10/2022: Had a migraine yesterday. Saw Dr. Ines in June to discuss Medication.   10/10/2021:  Had more headaches in may. Otherwise doing ok.   BOTOX  PROCEDURE NOTE FOR MIGRAINE HEADACHE    Contraindications and precautions discussed with patient(above). Aseptic procedure was observed and patient tolerated procedure. Procedure performed by Duwaine Russell, NP  The condition has existed for more than 6 months, and pt does not have a diagnosis of ALS, Myasthenia Gravis or Lambert-Eaton Syndrome.  Risks and benefits of injections discussed and pt agrees to proceed with the procedure.  Written consent obtained  These injections are medically necessary.  These injections do not cause sedations or hallucinations which the  oral therapies may cause.  Indication/Diagnosis: chronic migraine BOTOX (G9414) injection was performed according to protocol by [employer]. 200 units of BOTOX  was dissolved into 4 cc NS.   NDC: [address]-98  Botox - 200 units x 1 vial Lot: [id number] Expiration: 06/2025 NDC: 9976-6078-97   Bacteriostatic 0.9% Sodium Chloride - * mL  Lot: [id number] Expiration: 02/22/2024 NDC: 9590-8033-97   Dx: H56.288        Description of procedure:  The patient was placed in a sitting position. The standard protocol was used for Botox  as follows, with 5 units of Botox  injected at each site:   -Procerus muscle, midline injection  -Corrugator muscle, bilateral injection  -Frontalis muscle, bilateral injection, with 2 sites each side, medial injection was performed in the upper one third of the frontalis muscle, in the region vertical from the medial inferior edge of the superior orbital rim. The lateral injection was again in the upper one third of the forehead vertically above the lateral limbus of the cornea, 1.5 cm lateral to the medial injection site.  -Temporalis muscle injection, 4 sites, bilaterally. The first injection was 3 cm above the tragus of the ear, second injection site was 1.5 cm to 3 cm up from the first injection site in line with the tragus of the ear. The third injection site was 1.5-3 cm forward between the first 2 injection sites. The fourth injection site was 1.5 cm posterior to the second injection site.  -Occipitalis muscle injection, 3 sites, bilaterally. The first injection was done one half  way between the occipital protuberance and the tip of the mastoid process behind the ear. The second injection site was done lateral and superior to the first, 1 fingerbreadth from the first injection. The third injection site was 1 fingerbreadth superiorly and medially from the first injection site.  -Cervical paraspinal muscle injection, 2 sites, bilateral knee first injection site was 1  cm from the midline of the cervical spine, 3 cm inferior to the lower border of the occipital protuberance. The second injection site was 1.5 cm superiorly and laterally to the first injection site.  -Trapezius muscle injection was performed at 3 sites, bilaterally. The first injection site was in the upper trapezius muscle halfway between the inflection point of the neck, and the acromion. The second injection site was one half way between the acromion and the first injection site. The third injection was done between the first injection site and the inflection point of the neck.   Will return for repeat injection in 3 months.   A 200 units of Botox  was used, 155 units were injected, the rest of the Botox  was wasted. The patient tolerated the procedure well, there were no complications of the above procedure.  Duwaine Russell, MSN, NP-C 01/06/2024, 6:12 AM [facility] [address] [place], [place] [address] [phone]

## 2024-01-06 NOTE — Telephone Encounter (Signed)
 Pt has cx botox  due to a personal conflict, he has asked that his December appointment be placed on wait list

## 2024-01-07 ENCOUNTER — Other Ambulatory Visit (HOSPITAL_COMMUNITY): Payer: Self-pay

## 2024-01-07 MED ORDER — COVID-19 MRNA VAC-TRIS(PFIZER) 30 MCG/0.3ML IM SUSY
0.3000 mL | PREFILLED_SYRINGE | Freq: Once | INTRAMUSCULAR | 0 refills | Status: AC
Start: 2024-01-07 — End: 2024-01-08
  Filled 2024-01-07: qty 0.3, 1d supply, fill #0

## 2024-01-08 ENCOUNTER — Ambulatory Visit: Admitting: Cardiology

## 2024-01-13 ENCOUNTER — Encounter: Payer: Self-pay | Admitting: Adult Health

## 2024-01-13 NOTE — Telephone Encounter (Signed)
 Kyle Walters, please review, you last injected Botox  for migraine prevention in June and he was still on Qulipta  at the time.  Was the plan to continue with Emgality  as well?  It looks like it was last prescribed a year ago in October by Dr. Ines.  Patient has also seen Megan, I will copy her too on this, I am not sure if he has been on Emgality , Botox , Qulipta , all at the same time?

## 2024-01-14 NOTE — Telephone Encounter (Signed)
 Bethany,   Can you clarify with the patient if he is taking qulipta  and emaglity?

## 2024-01-15 NOTE — Telephone Encounter (Signed)
 After reviewing the chart I see a OV 02-21-2022 that Dr. Ines prescribed Emgality .  Pt was already on qulipta , ubrelvy  and botox .  Pt states he is taking all of these.

## 2024-01-15 NOTE — Telephone Encounter (Signed)
 I called the patient and left a voicemail.  I will discuss his medication when he calls back.

## 2024-01-18 ENCOUNTER — Emergency Department (HOSPITAL_COMMUNITY)
Admission: EM | Admit: 2024-01-18 | Discharge: 2024-01-18 | Disposition: A | Discharge status: Home / Self Care | Attending: Emergency Medicine | Admitting: Emergency Medicine

## 2024-01-18 ENCOUNTER — Encounter (HOSPITAL_COMMUNITY): Payer: Self-pay | Admitting: *Deleted

## 2024-01-18 ENCOUNTER — Other Ambulatory Visit: Payer: Self-pay

## 2024-01-18 ENCOUNTER — Emergency Department (HOSPITAL_COMMUNITY)

## 2024-01-18 ENCOUNTER — Telehealth: Payer: Self-pay

## 2024-01-18 DIAGNOSIS — E871 Hypo-osmolality and hyponatremia: Secondary | ICD-10-CM | POA: Insufficient documentation

## 2024-01-18 DIAGNOSIS — J449 Chronic obstructive pulmonary disease, unspecified: Secondary | ICD-10-CM | POA: Insufficient documentation

## 2024-01-18 DIAGNOSIS — Z87891 Personal history of nicotine dependence: Secondary | ICD-10-CM | POA: Insufficient documentation

## 2024-01-18 DIAGNOSIS — I2510 Atherosclerotic heart disease of native coronary artery without angina pectoris: Secondary | ICD-10-CM | POA: Insufficient documentation

## 2024-01-18 DIAGNOSIS — Z7901 Long term (current) use of anticoagulants: Secondary | ICD-10-CM | POA: Diagnosis not present

## 2024-01-18 DIAGNOSIS — R918 Other nonspecific abnormal finding of lung field: Secondary | ICD-10-CM | POA: Diagnosis not present

## 2024-01-18 DIAGNOSIS — M40204 Unspecified kyphosis, thoracic region: Secondary | ICD-10-CM | POA: Diagnosis not present

## 2024-01-18 DIAGNOSIS — R072 Precordial pain: Secondary | ICD-10-CM | POA: Diagnosis not present

## 2024-01-18 DIAGNOSIS — J439 Emphysema, unspecified: Secondary | ICD-10-CM | POA: Diagnosis not present

## 2024-01-18 DIAGNOSIS — I10 Essential (primary) hypertension: Secondary | ICD-10-CM | POA: Insufficient documentation

## 2024-01-18 DIAGNOSIS — R079 Chest pain, unspecified: Secondary | ICD-10-CM | POA: Diagnosis not present

## 2024-01-18 LAB — BASIC METABOLIC PANEL WITH GFR
Anion gap: 11 (ref 5–15)
BUN: 16 mg/dL (ref 8–23)
CO2: 25 mmol/L (ref 22–32)
Calcium: 8.8 mg/dL — ABNORMAL LOW (ref 8.9–10.3)
Chloride: 97 mmol/L — ABNORMAL LOW (ref 98–111)
Creatinine, Ser: 0.99 mg/dL (ref 0.61–1.24)
GFR, Estimated: >60 mL/min (ref >60)
Glucose, Bld: 86 mg/dL (ref 70–99)
Potassium: 3.7 mmol/L (ref 3.5–5.1)
Sodium: 133 mmol/L — ABNORMAL LOW (ref 135–145)

## 2024-01-18 LAB — CBC
HCT: 39.2 % (ref 39.0–52.0)
Hemoglobin: 13.1 g/dL (ref 13.0–17.0)
MCH: 31.1 pg (ref 26.0–34.0)
MCHC: 33.4 g/dL (ref 30.0–36.0)
MCV: 93.1 fL (ref 80.0–100.0)
Platelets: 189 K/uL (ref 150–400)
RBC: 4.21 MIL/uL — ABNORMAL LOW (ref 4.22–5.81)
RDW: 13 % (ref 11.5–15.5)
WBC: 6.2 K/uL (ref 4.0–10.5)
nRBC: 0 % (ref 0.0–0.2)

## 2024-01-18 LAB — TROPONIN I (HIGH SENSITIVITY)
Troponin I (High Sensitivity): 14 ng/L (ref <18)
Troponin I (High Sensitivity): 15 ng/L (ref <18)

## 2024-01-18 NOTE — ED Provider Triage Note (Signed)
 Emergency Medicine Provider Triage Evaluation Note  Kyle Walters , a 68 y.o. male  was evaluated in triage.  Pt complains of left-sided chest pain with associated shortness of breath that began approximately 45 minutes prior to arrival.  He denies nausea, vomiting, abdominal pain  Review of Systems  Positive:  Negative:   Physical Exam  BP 132/66 (BP Location: Right Arm)   Pulse (!) 54   Temp 97.9 F (36.6 C)   Resp 17   Ht 5' 6 (1.676 m)   Wt 71.2 kg   SpO2 94%   BMI 25.34 kg/m  Gen:   Awake, no distress   Resp:  Normal effort  MSK:   Moves extremities without difficulty  Other:    Medical Decision Making  Medically screening exam initiated at 12:54 AM.  Appropriate orders placed.  Kyle Walters was informed that the remainder of the evaluation will be completed by another provider, this initial triage assessment does not replace that evaluation, and the importance of remaining in the ED until their evaluation is complete.     Logan Ubaldo NOVAK, [place] 01/18/24 [phone]

## 2024-01-18 NOTE — Telephone Encounter (Signed)
 The patient called into say that he has bradycardia and he read that is a contraindication to Levaquin. Told him I would message the MD who he saw for clarification ( Dr Midge),, which was done  but he can reach out to his pharmacist or his PCP for further questions. Once I get a response I will call the patient back for  next steps.

## 2024-01-18 NOTE — ED Provider Notes (Signed)
 [place] [facility] DEPARTMENT AT [facility] Provider Note   CSN: [id number] Arrival date & time: 01/18/24  0002     Patient presents with: Chest Pain   Kyle Walters is a 68 y.o. male.    Chest Pain Associated symptoms: shortness of breath   Associated symptoms: no fever   Patient with extensive history including COPD is a former smoker, CAD with DES placed in 2022 presents with chest pain. Patient reports he had a emotional shock around 11 PM.  Soon after he started having chest pressure on the left side.  He reports mild shortness of breath that he was trying breathing exercises to help his pain.  No fevers or vomiting.  No diaphoresis.  He is now feeling improved. He does not get chest pain frequently. No previous history of VTE per patient    Past Medical History:  Diagnosis Date   ADHD (attention deficit hyperactivity disorder)    Allergy    Anemia    Anxiety panic attack   Arthritis    COPD (chronic obstructive pulmonary disease) (HCC)    bronchitis/emphysema   Coronary artery disease    Depression    Emphysema lung (HCC)    Emphysema of lung (HCC)    Enlarged prostate    Fibromyalgia    GERD (gastroesophageal reflux disease)    Hyperlipidemia    Hypertension    IBS (irritable bowel syndrome)    Migraine    Myocardial infarction (HCC)    Panic attack     Prior to Admission medications   Medication Sig Start Date End Date Taking? Authorizing Provider  albuterol  (VENTOLIN  HFA) 108 (90 Base) MCG/ACT inhaler Inhale 2 puffs into the lungs every 6 (six) hours as needed for wheezing or shortness of breath. 10/23/22   Parrett, Madelin RAMAN, NP  ALPRAZolam  (XANAX ) 0.5 MG tablet Take 0.5 mg by mouth every 6 (six) hours. 06/04/23   [provider]  Atogepant  (QULIPTA ) 60 MG TABS Take 1 tablet (60 mg total) by mouth daily. 10/02/23   Ines Onetha NOVAK, MD  B Complex Vitamins (VITAMIN B-COMPLEX) TABS Take by mouth. 02/07/16   [provider]   botulinum toxin Type A  (BOTOX ) 200 units injection INJECT 155 UNITS INTRAMUSCULARLY TO HEAD AND NECK EVERY 3 MONTHS  (DISCARD UNUSED AFTER FIRST USE) 06/24/23   Ines Onetha NOVAK, MD  BREO ELLIPTA  100-25 MCG/ACT AEPB INHALE 1 PUFF BY MOUTH EVERY DAY 11/11/23   Byrum, Robert S, MD  Cholecalciferol 50 MCG (2000 UT) TABS Take by mouth. 02/07/16   [provider]  clopidogrel  (PLAVIX ) 75 MG tablet Take 1 tablet (75 mg total) by mouth daily. 11/12/23   Patwardhan, Newman PARAS, MD  COVID-19 mRNA vaccine, Pfizer, (COMIRNATY ) syringe Inject into the muscle. 07/15/23     doxazosin  (CARDURA ) 8 MG tablet Take 8 mg by mouth daily.    [provider]  EMGALITY  120 MG/ML SOAJ INJECT SUBCUTANEOUSLY THE  CONTENTS OF 1 PEN (120 MG)  MONTHLY AS MAINTENANCE DOSE 02/06/23   Ines Onetha B, MD  EPINEPHrine 0.3 mg/0.3 mL IJ SOAJ injection Inject 0.3 mg into the muscle as needed. 10/07/22   [provider]  escitalopram  (LEXAPRO ) 20 MG tablet Take 10 mg by mouth in the morning. 10/26/14   [provider]  gabapentin  (NEURONTIN ) 400 MG capsule Take 400 mg by mouth 4 (four) times daily.     [provider]  memantine  (NAMENDA ) 10 MG tablet TAKE 2 TABLETS BY MOUTH IN THE MORNING  AND 1 TABLET IN THE LATE AFTERNOON 11/06/22   Ahern, Antonia B, MD  Multiple Vitamin (MULTIVITAMIN WITH MINERALS) TABS tablet Take 1 tablet by mouth daily after breakfast. Centrum Silver for Men    [provider]  Nerve Stimulator (NERIVIO) DEVI USE WITHIN 1 HR OF MIGRAINE. SET A STRONG YET COMFORTABLE INTENSITY LEVEL & MAINTAIN LEVEL FOR 45 MINS 01/23/23   Ines Onetha NOVAK, MD  nitroGLYCERIN  (NITROSTAT ) 0.4 MG SL tablet Place 1 tablet (0.4 mg total) under the tongue every 5 (five) minutes as needed. 03/14/21   Henry Manuelita NOVAK, NP  Omega-3 Fatty Acids (FISH OIL) 1200 MG CAPS Take 1,200 mg by mouth daily after breakfast.    [provider]  Riboflavin POWD Take by mouth. 02/07/16   [provider]  rosuvastatin  (CRESTOR ) 20 MG tablet Take 1 tablet (20 mg total) by mouth daily. 11/04/23   Patwardhan, Newman PARAS, MD  silodosin  (RAPAFLO ) 8 MG CAPS capsule Take 8 mg by mouth daily with breakfast.    [provider]  Ubrogepant  (UBRELVY ) 100 MG TABS TAKE 1 TABLET MOUTH AS NEEDED AS CLOSE TO MIGRAINE ONSET AS POSSIBLE. MAY REPEAT IN 2 HOURS IF NEEDED. MAX 200 MG PER 24 HOURS. 10/02/23   Ines Onetha NOVAK, MD  vitamin B-12 (CYANOCOBALAMIN) 500 MCG tablet Take 500 mcg by mouth daily.    [provider]    Allergies: Plasticized base [plastibase], Tetracycline, Triptans, Effexor [venlafaxine hcl], Prednisone, and Sulfamethoxazole-trimethoprim    Review of Systems  Constitutional:  Negative for fever.  Respiratory:  Positive for shortness of breath.   Cardiovascular:  Positive for chest pain.    Updated Vital Signs BP 132/62   Pulse (!) 43   Temp 97.7 F (36.5 C) (Oral)   Resp 11   Ht 1.676 m (5' 6)   Wt 71.2 kg   SpO2 95%   BMI 25.34 kg/m   Physical Exam CONSTITUTIONAL: Well developed/well nourished, no distress HEAD: Normocephalic/atraumatic EYES: EOMI/PERRL ENMT: Mucous membranes moist NECK: supple no meningeal signs SPINE/BACK:entire spine nontender CV: S1/S2 noted, no murmurs/rubs/gallops noted LUNGS: Lungs are clear to auscultation bilaterally, no apparent distress ABDOMEN: soft, nontender NEURO: Pt is awake/alert/appropriate, moves all extremitiesx4.  No facial droop.  Patient walks around no distress EXTREMITIES: pulses normal/equal, full ROM, no lower extremity edema or tenderness distal pulses intact SKIN: warm, color normal PSYCH: no abnormalities of mood noted, alert and oriented to situation  (all labs ordered are listed, but only abnormal results are displayed) Labs Reviewed  BASIC METABOLIC PANEL WITH GFR - Abnormal; Notable for the following components:      Result Value   Sodium 133 (*)    Chloride 97 (*)    Calcium  8.8 (*)     All other components within normal limits  CBC - Abnormal; Notable for the following components:   RBC 4.21 (*)    All other components within normal limits  TROPONIN I (HIGH SENSITIVITY)  TROPONIN I (HIGH SENSITIVITY)    EKG: EKG Interpretation Date/Time:  Saturday January 18 2024 04:17:16 EDT Ventricular Rate:  43 PR Interval:  154 QRS Duration:  142 QT Interval:  497 QTC Calculation: 421 R Axis:   83  Text Interpretation: Sinus bradycardia Atrial premature complex Nonspecific intraventricular conduction delay Anterolateral infarct, age indeterminate no change since August 2025 Confirmed by Midge Golas ([id number]) on 01/18/2024 4:25:55 AM  Radiology: [facility] Chest 2 View Addendum Date: 01/18/2024 ADDENDUM REPORT: 01/18/2024 04:33 ADDENDUM: Study discussed by telephone with Dr. Midge  in the emergency department on 01/18/2024 at 0422 hours. Chronic Emphysema (ICD10-J43.9) in this patient who underwent lung cancer screening chest CT most recently in June. Lower lobe lung scarring on that exam, including plate-like on the right. However, patchy and indistinct right lung base opacity on these images does appear progressed on the PA view now compared 2 radiographs last year, not well correlated on the lateral. Favor acute infectious exacerbation at the right lung base. Followup PA and lateral chest X-ray is recommended in 3-4 weeks following trial of antibiotic therapy to ensure resolution. This was discussed with Dr. Midge Electronically Signed   By: VEAR Hurst M.D.   On: 01/18/2024 04:33   Result Date: 01/18/2024 CLINICAL DATA:  EXAM: 2 VIEW(S) XRAY OF THE CHEST 01/18/2024 12:35:11 AM COMPARISON: 01/22/2023 CLINICAL HISTORY: chest pain for 2 hours. Chest pain FINDINGS: LUNGS AND PLEURA: Hyperinflation. Chronic interstitial changes. Possible mild hazy opacity in the right lower lung. No pulmonary edema. No pleural effusion. No pneumothorax. HEART AND MEDIASTINUM: Unchanged coronary artery  stents. No acute abnormality of the cardiac and mediastinal silhouettes. BONES AND SOFT TISSUES: Mild thoracic kyphosis. Osteopenia. No acute osseous abnormality. IMPRESSION: 1. Possible mild hazy opacity in the right lower lung. Consider CT for further evaluation. 2. Emphysema. Electronically signed by: Norman Gatlin MD 01/18/2024 01:05 AM EDT RP Workstation: [id number] Electronically Signed   By: VEAR Hurst M.D.   On: 01/18/2024 04:33     Procedures   Medications Ordered in the ED - No data to display  Clinical Course as of 01/18/24 0634  Sat Jan 18, 2024  0448 Patient currently stable, overall with resting comfortably, pain is improving. Initial workup was unremarkable [DW]  [phone] Overall patient is well-appearing.  He has been sleeping in no new distress No new chest pain. 2 EKGs tonight were reviewed with prior, no acute change.  Troponins unremarkable Patient does have sinus bradycardia but no other acute findings I offered patient admission versus discharge.  He feels comfortable discharge and follow-up cardiology [DW]  [phone] He denies any recent cough, fever or respiratory symptoms.  Will defer antibiotics for now but does need outpatient x-rays we discussed those findings [DW]    Clinical Course User Index [DW] Midge Golas, MD             HEART Score: 4                    Medical Decision Making Amount and/or Complexity of Data Reviewed ECG/medicine tests: ordered.   This patient presents to the ED for concern of chest pain, this involves an extensive number of treatment options, and is a complaint that carries with it a high risk of complications and morbidity.  The differential diagnosis includes but is not limited to acute coronary syndrome, aortic dissection, pulmonary embolism, pericarditis, pneumothorax, pneumonia, myocarditis, pleurisy, esophageal rupture    Comorbidities that complicate the patient evaluation: Patient's presentation is complicated by their history  of CAD  Social Determinants of Health: Patient's family stressors  increases the complexity of managing their presentation  Additional history obtained: Records reviewed cardiology notes reviewed  Lab Tests: I Ordered, and personally interpreted labs.  The pertinent results include: Labs reassuring  Imaging Studies ordered: I ordered imaging studies including X-ray chest  I independently visualized and interpreted imaging which showed no acute findings,? Hazy appearance though patient had a recent negative CT chest cancer screening I agree with the radiologist interpretation  Test Considered: Patient with a heart score  of 4, and offered admission, but he declined  Consultations Obtained: I requested consultation with the radiologist Dr. Jama Hurst, and discussed  findings as well as pertinent plan - they recommend: Lesion in right chest likely chronic, similar to previous and recent CT chest screening  Reevaluation: After the interventions noted above, I reevaluated the patient and found that they have :improved  Complexity of problems addressed: Patient's presentation is most consistent with  acute presentation with potential threat to life or bodily function  Disposition: After consideration of the diagnostic results and the patient's response to treatment,  I feel that the patent would benefit from discharge  .        Final diagnoses:  Precordial pain    ED Discharge Orders     None          Midge Golas, MD 01/18/24 [phone]

## 2024-01-18 NOTE — ED Triage Notes (Signed)
 The pt is c/o chest pain  and sl sob none at present

## 2024-01-18 NOTE — ED Notes (Signed)
 Urine sent to main lab.SABRA..KM

## 2024-01-18 NOTE — Discharge Instructions (Signed)
 As we discussed, if you start having any cough fever or shortness of breath you may need some antibiotics You may need a follow-up x-ray in about a month, see your primary doctor for that  Your caregiver has diagnosed you as having chest pain that is not specific for one problem, but does not require admission.  Chest pain comes from many different causes.  SEEK IMMEDIATE MEDICAL ATTENTION IF: You have severe chest pain, especially if the pain is crushing or pressure-like and spreads to the arms, back, neck, or jaw, or if you have sweating, nausea (feeling sick to your stomach), or shortness of breath. THIS IS AN EMERGENCY. Don't wait to see if the pain will go away. Get medical help at once. Call 911 or 0 (operator). DO NOT drive yourself to the hospital.  Your chest pain gets worse and does not go away with rest.  You have an attack of chest pain lasting longer than usual, despite rest and treatment with the medications your caregiver has prescribed.  You wake from sleep with chest pain or shortness of breath.  You feel dizzy or faint.  You have chest pain not typical of your usual pain for which you originally saw your caregiver.

## 2024-01-18 NOTE — ED Provider Notes (Signed)
 Pt called in requesting a course of abx.  States it was discussed with Dr Midge.  CXR did show mild hazy opacity right lower lung.  RN Vernal will call in levaquin 500 mg po every day for 7 days.    Randol Simmonds, MD 01/18/24 [phone]

## 2024-01-18 NOTE — Telephone Encounter (Signed)
 Patient called in to ask for antibiotic, he was seen this morning and looked over the xray report.  He said hte doctor taking care of him Dr Midge had asked him about a antibiotic, but he declined at that time. Messaged with Provider Dr Randol and Melvenia. Dr Randol gave orders to call in Levaquin 500 mg 1 daily for 7 days no refills. Called to [facility] on [address]  per patient request.

## 2024-01-20 ENCOUNTER — Other Ambulatory Visit (HOSPITAL_COMMUNITY): Payer: Self-pay

## 2024-01-20 ENCOUNTER — Encounter: Payer: Self-pay | Admitting: Cardiology

## 2024-01-20 ENCOUNTER — Ambulatory Visit: Attending: Cardiology | Admitting: Cardiology

## 2024-01-20 VITALS — BP 100/50 | HR 62 | Ht 67.0 in | Wt 157.0 lb

## 2024-01-20 DIAGNOSIS — I25118 Atherosclerotic heart disease of native coronary artery with other forms of angina pectoris: Secondary | ICD-10-CM | POA: Diagnosis not present

## 2024-01-20 DIAGNOSIS — I255 Ischemic cardiomyopathy: Secondary | ICD-10-CM | POA: Diagnosis not present

## 2024-01-20 DIAGNOSIS — I513 Intracardiac thrombosis, not elsewhere classified: Secondary | ICD-10-CM

## 2024-01-20 DIAGNOSIS — J189 Pneumonia, unspecified organism: Secondary | ICD-10-CM | POA: Diagnosis not present

## 2024-01-20 MED ORDER — AZITHROMYCIN 250 MG PO TABS
ORAL_TABLET | ORAL | 0 refills | Status: AC
  Filled 2024-01-20: qty 6, 5d supply, fill #0

## 2024-01-20 MED ORDER — AMOXICILLIN-POT CLAVULANATE 875-125 MG PO TABS
1.0000 | ORAL_TABLET | Freq: Two times a day (BID) | ORAL | 0 refills | Status: AC
  Filled 2024-01-20: qty 10, 5d supply, fill #0

## 2024-01-20 MED ORDER — APIXABAN 5 MG PO TABS
5.0000 mg | ORAL_TABLET | Freq: Two times a day (BID) | ORAL | 3 refills | Status: AC
  Filled 2024-01-20: qty 180, 90d supply, fill #0

## 2024-01-20 NOTE — Patient Instructions (Addendum)
 Medication Instructions:  START Eliquis  5 mg twice daily  STOP Plavix   STOP Levofloxacin   START Augmentin  START Z-pack   *If you need a refill on your cardiac medications before your next appointment, please call your pharmacy*  Lab Work: Lipid panel   If you have labs (blood work) drawn today and your tests are completely normal, you will receive your results only by: MyChart Message (if you have MyChart) OR A paper copy in the mail If you have any lab test that is abnormal or we need to change your treatment, we will call you to review the results.  Follow-Up: At [facility], you and your health needs are our priority.  As part of our continuing mission to provide you with exceptional heart care, our providers are all part of one team.  This team includes your primary Cardiologist (physician) and Advanced Practice Providers or APPs (Physician Assistants and Nurse Practitioners) who all work together to provide you with the care you need, when you need it.  Your next appointment:   1 year(s)  Provider:   Newman JINNY Lawrence, MD

## 2024-01-20 NOTE — Progress Notes (Signed)
 Cardiology Office Note:  .   Date:  01/20/2024  ID:  Kyle Walters, DOB [birth year], MRN [id number] PCP: Kyle Emery CROME, MD  [facility] Providers Cardiologist:  Kyle Lawrence, MD PCP: Kyle Emery CROME, MD  Chief Complaint  Patient presents with   Chest Pain      History of Present Illness: .    Kyle Walters is a 68 y.o. male with hypertension, CAD, h/o LV thrombus, bipolar disorder  Patient was recently seen in the emergency room with complaints of right-sided chest pain.  He was found to have focal opacity concerning for pneumonia.  He has also had subjective fevers since then.  He was recommended levofloxacin.  However, patient is very concerned about taking levofloxacin with possible side effect of increased suicidal risk.  Patient has bipolar disorder and has had prior suicide attempt.  Vitals:   01/20/24 1315  BP: (!) 100/50  Pulse: 62  SpO2: 92%       ROS:  Review of Systems  Cardiovascular:  Negative for chest pain, claudication, dyspnea on exertion, leg swelling, palpitations and syncope.     Studies Reviewed: SABRA        EKG 03/26/2023: Normal sinus rhythm Anterolateral infarct (cited on or before 22-Jan-2023) When compared with ECG of 22-Jan-2023 02:15, No significant change since   Chest x-ray 01/18/2024: 1. Possible mild hazy opacity in the right lower lung. Consider CT for further evaluation. 2. Emphysema.    Cardiac MRI 06/2023: 1. Normal left ventricular size with LV EF 54%. The true apex as well as the apical anterior, apical septal, apical inferior, and apical lateral walls are thinned and akinetic. 2.  Normal RV size with RV EF 59%  3. Extensive LGE suggestive of prior MI involving the true apex and the peril-apical segments. 4.  Normal extracellular volume percentage.  5. No obvious LV thrombus on cine images. Possible very small LV thrombus seen in one view on the delayed enhancement images. The patient certainly has the  substrate to form LV thrombus based on the akinetic apex and peril-apical segments.  Echocardiogram 06/05/2023: 1. Apex is aneurysmal. No LV thrombus seen. Left ventricular ejection  fraction, by estimation, is 50 to 55%. The left ventricle has low normal  function. Left ventricular diastolic parameters are consistent with Grade  II diastolic dysfunction (pseudonormalization).   2. Right ventricular systolic function is normal. The right ventricular  size is normal. There is normal pulmonary artery systolic pressure.   3. The aortic valve is tricuspid. Aortic valve regurgitation is not  visualized.   4. The inferior vena cava is normal in size with greater than 50%  respiratory variability, suggesting right atrial pressure of 3 mmHg.   Comparison(s): No significant change from prior study.   Stress test 03/2023:   Findings are consistent with infarction. The study is intermediate risk.   No ST deviation was noted.   LV perfusion is abnormal. There is no evidence of ischemia. There is evidence of infarction. Defect 1: There is a large defect with absent uptake present in the apical to mid anterior, anteroseptal, inferior, septal and apex location(s) that is fixed. Viability is absent. There is abnormal wall motion in the defect area. Consistent with infarction.   Left ventricular function is abnormal. Global function is moderately reduced. There was a single regional abnormality. Nuclear stress EF: 41%. The left ventricular ejection fraction is moderately decreased (30-44%). End diastolic cavity size is mildly enlarged. End systolic cavity size is mildly enlarged.  Large anteroapical/septal distal anterior wall and inferior apical MI with aneurysmal formation Moderatetly reduced EF 41% Consider MRI quantification given large scar burden Normal ETT with no ischemic changes No significant arrhythmias and normal hemodynamic response to exercise    Vascular ultrasound 03/2023: Right: Resting  right ankle-brachial index is within normal range. The  right toe-brachial index is normal.   Left: Resting left ankle-brachial index is within normal range. The left  toe-brachial index is normal.   Labs 12/2023: Hb 13.1 Cr 0.99, Na 133 Trop HS 15, 15  Labs 02/05/2023: Chol 116, TG 36, HDL 58, LDL 48 Hb 13.8  Coronary angiogram 03/14/2021:  Mid LAD lesion is 100% stenosed.  Left to left collaterals.  The apex was nonviable by cardiac MRI.   RPAV lesion is 100% stenosed.  Left to right collaterals.   Prox RCA lesion is 90% stenosed.   A drug-eluting stent was successfully placed using a STENT ONYX FRONTIER 3.0X34, postdilated to greater than 3.75 mm.   Post intervention, there is a 0% residual stenosis.   Mid RCA lesion is 75% stenosed.   A drug-eluting stent was successfully placed using a STENT ONYX FRONTIER 2.75X22.   Post intervention, there is a 0% residual stenosis.   LV end diastolic pressure is normal.   There is no aortic valve stenosis.   Continue dual antiplatelet therapy with aspirin  and Plavix .  He has completed 3 months of anticoagulation with Eliquis  for his LV thrombus.  Continue aggressive secondary prevention including high-dose statin.  Prevacid will have to be changed to Protonix .    Physical Exam:   Physical Exam Vitals and nursing note reviewed.  Constitutional:      General: He is not in acute distress. Neck:     Vascular: No JVD.  Cardiovascular:     Rate and Rhythm: Normal rate and regular rhythm.     Pulses:          Dorsalis pedis pulses are 2+ on the right side and 2+ on the left side.       Posterior tibial pulses are 1+ on the right side and 1+ on the left side.     Heart sounds: Normal heart sounds. No murmur heard. Pulmonary:     Effort: Pulmonary effort is normal.     Breath sounds: Normal breath sounds. No wheezing or rales.  Musculoskeletal:     Right lower leg: No edema.     Left lower leg: No edema.      VISIT DIAGNOSES: No  diagnosis found.     ASSESSMENT AND PLAN: .     Kyle Walters is a 68 y.o. male with hypertension, CAD, h/o LV thrombus, bipolar disorder  Community-acquired pneumonia: Chest pain, subjective fever, focal opacity on chest x-ray. Patient has had concerns about levofloxacin side effects, especially increased use at rest. At patient's request, I prescribed him an alternate antibiotic with Augmentin and azithromycin.  CAD: Stress testing with prior infarct, no ischemia (03/2023). Echocardiogram showed aneurysmal apex without clear thrombus, although Definity  contrast could not be injected due to IV access issues (05/2023). Cardiac MRI: No obvious LV thrombus on cine images. Possible very small LV thrombus seen in one view on the delayed enhancement images. The patient certainly has the substrate to form LV thrombus based on the\ akinetic apex and peril-apical segments. Patient did not use Eliquis  for 3 months and is back on Plavix . After further discussion of risks and benefits, we have made a shared decision of long-term anticoagulation  given persistent risk for LV thrombus formation.  To reduce bleeding risk, I discontinued his Plavix . Lipids historically well-controlled on statin, check lipid panel at patient's convenience.   F/u in 1 year   Signed, Kyle JINNY Lawrence, MD

## 2024-01-22 ENCOUNTER — Telehealth: Payer: Self-pay

## 2024-01-22 NOTE — Telephone Encounter (Signed)
 Copied from CRM [id number]. Topic: General - Other >> Jan 20, 2024  3:39 PM Corean SAUNDERS wrote: Reason for CRM: Patient calling to let Dr. Shelah know that  he has been diagnosed with pneumonia and is currently taking Augmentin and a z-pack.    Noted.   -NFN

## 2024-01-27 ENCOUNTER — Other Ambulatory Visit: Payer: Self-pay

## 2024-01-27 ENCOUNTER — Observation Stay (HOSPITAL_COMMUNITY)
Admission: EM | Admit: 2024-01-27 | Discharge: 2024-01-28 | Disposition: A | Discharge status: Home / Self Care | Attending: Internal Medicine | Admitting: Internal Medicine

## 2024-01-27 ENCOUNTER — Emergency Department (HOSPITAL_BASED_OUTPATIENT_CLINIC_OR_DEPARTMENT_OTHER)

## 2024-01-27 ENCOUNTER — Emergency Department (HOSPITAL_COMMUNITY)

## 2024-01-27 ENCOUNTER — Observation Stay (HOSPITAL_COMMUNITY)

## 2024-01-27 ENCOUNTER — Encounter (HOSPITAL_COMMUNITY): Payer: Self-pay

## 2024-01-27 DIAGNOSIS — R29818 Other symptoms and signs involving the nervous system: Secondary | ICD-10-CM | POA: Diagnosis not present

## 2024-01-27 DIAGNOSIS — R197 Diarrhea, unspecified: Secondary | ICD-10-CM | POA: Diagnosis not present

## 2024-01-27 DIAGNOSIS — E785 Hyperlipidemia, unspecified: Secondary | ICD-10-CM | POA: Insufficient documentation

## 2024-01-27 DIAGNOSIS — F419 Anxiety disorder, unspecified: Secondary | ICD-10-CM | POA: Diagnosis not present

## 2024-01-27 DIAGNOSIS — K589 Irritable bowel syndrome without diarrhea: Secondary | ICD-10-CM | POA: Insufficient documentation

## 2024-01-27 DIAGNOSIS — Z8719 Personal history of other diseases of the digestive system: Secondary | ICD-10-CM

## 2024-01-27 DIAGNOSIS — G459 Transient cerebral ischemic attack, unspecified: Principal | ICD-10-CM | POA: Insufficient documentation

## 2024-01-27 DIAGNOSIS — G4733 Obstructive sleep apnea (adult) (pediatric): Secondary | ICD-10-CM | POA: Insufficient documentation

## 2024-01-27 DIAGNOSIS — J439 Emphysema, unspecified: Secondary | ICD-10-CM | POA: Insufficient documentation

## 2024-01-27 DIAGNOSIS — I2510 Atherosclerotic heart disease of native coronary artery without angina pectoris: Secondary | ICD-10-CM | POA: Diagnosis not present

## 2024-01-27 DIAGNOSIS — R531 Weakness: Secondary | ICD-10-CM | POA: Diagnosis not present

## 2024-01-27 DIAGNOSIS — I700 Atherosclerosis of aorta: Secondary | ICD-10-CM | POA: Diagnosis not present

## 2024-01-27 DIAGNOSIS — Z79899 Other long term (current) drug therapy: Secondary | ICD-10-CM | POA: Diagnosis not present

## 2024-01-27 DIAGNOSIS — R202 Paresthesia of skin: Secondary | ICD-10-CM

## 2024-01-27 DIAGNOSIS — F32A Depression, unspecified: Secondary | ICD-10-CM | POA: Insufficient documentation

## 2024-01-27 DIAGNOSIS — Z7901 Long term (current) use of anticoagulants: Secondary | ICD-10-CM | POA: Insufficient documentation

## 2024-01-27 DIAGNOSIS — J449 Chronic obstructive pulmonary disease, unspecified: Secondary | ICD-10-CM | POA: Insufficient documentation

## 2024-01-27 DIAGNOSIS — I513 Intracardiac thrombosis, not elsewhere classified: Secondary | ICD-10-CM | POA: Insufficient documentation

## 2024-01-27 DIAGNOSIS — R918 Other nonspecific abnormal finding of lung field: Secondary | ICD-10-CM | POA: Diagnosis not present

## 2024-01-27 LAB — ECHOCARDIOGRAM COMPLETE BUBBLE STUDY
AR max vel: 3.44 cm2
AV Area VTI: 3.27 cm2
AV Area mean vel: 3.03 cm2
AV Mean grad: 3 mmHg
AV Peak grad: 7.7 mmHg
Ao pk vel: 1.39 m/s
Area-P 1/2: 3.1 cm2
Calc EF: 56.4 %
MV VTI: 2.7 cm2
S' Lateral: 2.6 cm
Single Plane A2C EF: 52.1 %
Single Plane A4C EF: 60.8 %

## 2024-01-27 LAB — COMPREHENSIVE METABOLIC PANEL WITH GFR
ALT: 35 U/L (ref 0–44)
AST: 40 U/L (ref 15–41)
Albumin: 4.2 g/dL (ref 3.5–5.0)
Alkaline Phosphatase: 96 U/L (ref 38–126)
Anion gap: 11 (ref 5–15)
BUN: 11 mg/dL (ref 8–23)
CO2: 25 mmol/L (ref 22–32)
Calcium: 9.6 mg/dL (ref 8.9–10.3)
Chloride: 102 mmol/L (ref 98–111)
Creatinine, Ser: 0.87 mg/dL (ref 0.61–1.24)
GFR, Estimated: >60 mL/min (ref >60)
Glucose, Bld: 86 mg/dL (ref 70–99)
Potassium: 3.5 mmol/L (ref 3.5–5.1)
Sodium: 137 mmol/L (ref 135–145)
Total Bilirubin: 0.7 mg/dL (ref 0.0–1.2)
Total Protein: 7 g/dL (ref 6.5–8.1)

## 2024-01-27 LAB — DIFFERENTIAL
Abs Immature Granulocytes: 0.02 K/uL (ref 0.00–0.07)
Basophils Absolute: 0.1 K/uL (ref 0.0–0.1)
Basophils Relative: 1 %
Eosinophils Absolute: 0.2 K/uL (ref 0.0–0.5)
Eosinophils Relative: 3 %
Immature Granulocytes: 0 %
Lymphocytes Relative: 38 %
Lymphs Abs: 2.3 K/uL (ref 0.7–4.0)
Monocytes Absolute: 0.7 K/uL (ref 0.1–1.0)
Monocytes Relative: 12 %
Neutro Abs: 2.8 K/uL (ref 1.7–7.7)
Neutrophils Relative %: 46 %

## 2024-01-27 LAB — URINALYSIS, ROUTINE W REFLEX MICROSCOPIC
Bacteria, UA: NONE SEEN
Bilirubin Urine: NEGATIVE
Glucose, UA: NEGATIVE mg/dL
Ketones, ur: NEGATIVE mg/dL
Leukocytes,Ua: NEGATIVE
Nitrite: NEGATIVE
Protein, ur: NEGATIVE mg/dL
Specific Gravity, Urine: 1.008 (ref 1.005–1.030)
pH: 6 (ref 5.0–8.0)

## 2024-01-27 LAB — RESP PANEL BY RT-PCR (RSV, FLU A&B, COVID)  RVPGX2
Influenza A by PCR: NEGATIVE
Influenza B by PCR: NEGATIVE
Resp Syncytial Virus by PCR: NEGATIVE
SARS Coronavirus 2 by RT PCR: NEGATIVE

## 2024-01-27 LAB — CBC
HCT: 42.2 % (ref 39.0–52.0)
Hemoglobin: 13.8 g/dL (ref 13.0–17.0)
MCH: 30.6 pg (ref 26.0–34.0)
MCHC: 32.7 g/dL (ref 30.0–36.0)
MCV: 93.6 fL (ref 80.0–100.0)
Platelets: 199 K/uL (ref 150–400)
RBC: 4.51 MIL/uL (ref 4.22–5.81)
RDW: 13.2 % (ref 11.5–15.5)
WBC: 6.2 K/uL (ref 4.0–10.5)
nRBC: 0 % (ref 0.0–0.2)

## 2024-01-27 LAB — I-STAT CHEM 8, ED
BUN: 10 mg/dL (ref 8–23)
Calcium, Ion: 1.25 mmol/L (ref 1.15–1.40)
Chloride: 99 mmol/L (ref 98–111)
Creatinine, Ser: 1.1 mg/dL (ref 0.61–1.24)
Glucose, Bld: 90 mg/dL (ref 70–99)
HCT: 42 % (ref 39.0–52.0)
Hemoglobin: 14.3 g/dL (ref 13.0–17.0)
Potassium: 3.4 mmol/L — ABNORMAL LOW (ref 3.5–5.1)
Sodium: 139 mmol/L (ref 135–145)
TCO2: 27 mmol/L (ref 22–32)

## 2024-01-27 LAB — URINE DRUG SCREEN
Amphetamines: NEGATIVE
Barbiturates: NEGATIVE
Benzodiazepines: POSITIVE — AB
Cocaine: NEGATIVE
Fentanyl: NEGATIVE
Methadone Scn, Ur: NEGATIVE
Opiates: NEGATIVE
Tetrahydrocannabinol: NEGATIVE

## 2024-01-27 LAB — APTT: aPTT: 35 s (ref 24–36)

## 2024-01-27 LAB — CBG MONITORING, ED: Glucose-Capillary: 92 mg/dL (ref 70–99)

## 2024-01-27 LAB — HEMOGLOBIN A1C
Hgb A1c MFr Bld: 5.1 % (ref 4.8–5.6)
Mean Plasma Glucose: 99.67 mg/dL

## 2024-01-27 LAB — PROTIME-INR
INR: 1.3 — ABNORMAL HIGH (ref 0.8–1.2)
Prothrombin Time: 16.5 s — ABNORMAL HIGH (ref 11.4–15.2)

## 2024-01-27 LAB — ETHANOL: Alcohol, Ethyl (B): <15 mg/dL (ref <15)

## 2024-01-27 MED ORDER — TAMSULOSIN HCL 0.4 MG PO CAPS
0.4000 mg | ORAL_CAPSULE | Freq: Every day | ORAL | Status: DC
  Filled 2024-01-27: qty 1

## 2024-01-27 MED ORDER — PANTOPRAZOLE SODIUM 40 MG PO TBEC
40.0000 mg | DELAYED_RELEASE_TABLET | Freq: Every day | ORAL | Status: DC
Start: 2024-01-27 — End: 2024-01-28
  Administered 2024-01-27 – 2024-01-28 (×2): 40 mg via ORAL
  Filled 2024-01-27 (×2): qty 1

## 2024-01-27 MED ORDER — ROSUVASTATIN CALCIUM 10 MG PO TABS
20.0000 mg | ORAL_TABLET | Freq: Every day | ORAL | Status: DC
  Administered 2024-01-27 – 2024-01-28 (×2): 20 mg via ORAL
  Filled 2024-01-27: qty 1
  Filled 2024-01-27: qty 2

## 2024-01-27 MED ORDER — ACETAMINOPHEN 650 MG RE SUPP: 650.0000 mg | Freq: Four times a day (QID) | RECTAL | Status: DC | PRN

## 2024-01-27 MED ORDER — FLUTICASONE FUROATE-VILANTEROL 100-25 MCG/ACT IN AEPB
1.0000 | INHALATION_SPRAY | Freq: Every day | RESPIRATORY_TRACT | Status: DC
  Administered 2024-01-28: 1 via RESPIRATORY_TRACT
  Filled 2024-01-27: qty 28

## 2024-01-27 MED ORDER — GABAPENTIN 400 MG PO CAPS
400.0000 mg | ORAL_CAPSULE | Freq: Four times a day (QID) | ORAL | Status: DC
  Administered 2024-01-27 – 2024-01-28 (×2): 400 mg via ORAL
  Filled 2024-01-27 (×3): qty 1

## 2024-01-27 MED ORDER — ONDANSETRON HCL 4 MG/2ML IJ SOLN: 4.0000 mg | Freq: Four times a day (QID) | INTRAMUSCULAR | Status: DC | PRN

## 2024-01-27 MED ORDER — MEMANTINE HCL 10 MG PO TABS
10.0000 mg | ORAL_TABLET | Freq: Every day | ORAL | Status: DC
Start: 2024-01-27 — End: 2024-01-28
  Filled 2024-01-27: qty 2
  Filled 2024-01-27: qty 1

## 2024-01-27 MED ORDER — DIPHENHYDRAMINE HCL 50 MG/ML IJ SOLN: 25.0000 mg | Freq: Once | INTRAMUSCULAR | Status: DC

## 2024-01-27 MED ORDER — STROKE: EARLY STAGES OF RECOVERY BOOK
Freq: Once | Status: AC
  Filled 2024-01-27: qty 1

## 2024-01-27 MED ORDER — DOXAZOSIN MESYLATE 4 MG PO TABS
8.0000 mg | ORAL_TABLET | Freq: Every day | ORAL | Status: DC
  Administered 2024-01-27 – 2024-01-28 (×2): 8 mg via ORAL
  Filled 2024-01-27: qty 2
  Filled 2024-01-27: qty 1
  Filled 2024-01-27: qty 2

## 2024-01-27 MED ORDER — MEMANTINE HCL 10 MG PO TABS
20.0000 mg | ORAL_TABLET | Freq: Every day | ORAL | Status: DC
Start: 2024-01-28 — End: 2024-01-28
  Administered 2024-01-28: 20 mg via ORAL
  Filled 2024-01-27: qty 2

## 2024-01-27 MED ORDER — ALPRAZOLAM 0.5 MG PO TABS
0.5000 mg | ORAL_TABLET | Freq: Four times a day (QID) | ORAL | Status: DC | PRN
Start: 2024-01-27 — End: 2024-01-28
  Administered 2024-01-27 – 2024-01-28 (×2): 0.5 mg via ORAL
  Filled 2024-01-27 (×2): qty 1

## 2024-01-27 MED ORDER — ONDANSETRON HCL 4 MG PO TABS: 4.0000 mg | ORAL_TABLET | Freq: Four times a day (QID) | ORAL | Status: DC | PRN

## 2024-01-27 MED ORDER — APIXABAN 5 MG PO TABS
5.0000 mg | ORAL_TABLET | Freq: Two times a day (BID) | ORAL | Status: DC
  Administered 2024-01-27 – 2024-01-28 (×2): 5 mg via ORAL
  Filled 2024-01-27 (×2): qty 1

## 2024-01-27 MED ORDER — ACETAMINOPHEN 325 MG PO TABS: 650.0000 mg | ORAL_TABLET | Freq: Once | ORAL | Status: DC

## 2024-01-27 MED ORDER — METOCLOPRAMIDE HCL 5 MG/ML IJ SOLN: 10.0000 mg | Freq: Once | INTRAMUSCULAR | Status: DC

## 2024-01-27 MED ORDER — ACETAMINOPHEN 325 MG PO TABS
650.0000 mg | ORAL_TABLET | Freq: Four times a day (QID) | ORAL | Status: DC | PRN
  Administered 2024-01-27: 650 mg via ORAL
  Filled 2024-01-27: qty 2

## 2024-01-27 MED ORDER — ALPRAZOLAM 0.5 MG PO TABS: 0.5000 mg | ORAL_TABLET | Freq: Four times a day (QID) | ORAL | Status: DC

## 2024-01-27 MED ORDER — ATOGEPANT 60 MG PO TABS: 1.0000 | ORAL_TABLET | Freq: Every day | ORAL | Status: DC

## 2024-01-27 MED ORDER — ESCITALOPRAM OXALATE 10 MG PO TABS
10.0000 mg | ORAL_TABLET | Freq: Every day | ORAL | Status: DC
  Administered 2024-01-27 – 2024-01-28 (×2): 10 mg via ORAL
  Filled 2024-01-27 (×2): qty 1

## 2024-01-27 MED ORDER — PERFLUTREN LIPID MICROSPHERE
1.0000 mL | INTRAVENOUS | Status: AC | PRN
  Administered 2024-01-27: 4 mL via INTRAVENOUS

## 2024-01-27 NOTE — Evaluation (Signed)
 Occupational Therapy Evaluation Patient Details Name: Kyle Walters MRN: [id number] DOB: March 08, 1956 Today's Date: 01/27/2024   History of Present Illness   68 y.o. male presented after he woke up with leaning to one side with left weakness, PMH Migraines, hyperlipidemia     Clinical Impressions Pt resting comfortably in chair, wife present during session. Pt is retired, lives with wife who works during the day, PLOF independent. Pt reports frequent migraines, ~10 a month on daily medication. Pt reports vision loss sometimes with migraines, and reports one other time where he had weakness for 1.5 days on R side. Pt currently at baseline, independent with ADLs and mobility, no residual weakness observed, slight decreased coordination to LUE, but Pt reports no different from baseline. Spoke with Pt about generalized nutrition about possible triggers for migraines, Pt reports he eats healthy and avoids additives in foods. Pt states he is ready to return home unless there is more medical work up to be completed to figure out what happened to him, RN/MD informed, no acute OT or follow up needed at this time. Signing off.      If plan is discharge home, recommend the following:         Functional Status Assessment   Patient has had a recent decline in their functional status and demonstrates the ability to make significant improvements in function in a reasonable and predictable amount of time.     Equipment Recommendations   None recommended by OT     Recommendations for Other Services         Precautions/Restrictions   Precautions Precautions: None Recall of Precautions/Restrictions: Intact Restrictions Weight Bearing Restrictions Per Provider Order: No     Mobility Bed Mobility Overal bed mobility: Independent                  Transfers Overall transfer level: Independent                        Balance Overall balance assessment: Independent                                          ADL either performed or assessed with clinical judgement   ADL Overall ADL's : Independent                                             Vision Baseline Vision/History: 1 Wears glasses Ability to See in Adequate Light: 0 Adequate Patient Visual Report: No change from baseline       Perception         Praxis         Pertinent Vitals/Pain Pain Assessment Pain Assessment: No/denies pain     Extremity/Trunk Assessment Upper Extremity Assessment Upper Extremity Assessment: Overall WFL for tasks assessed;LUE deficits/detail LUE Deficits / Details: very slight decreased coordination with FM skills, but WFLs and Pt reports is baseline. LUE Sensation: WNL LUE Coordination: WNL           Communication Communication Communication: No apparent difficulties   Cognition Arousal: Alert Behavior During Therapy: WFL for tasks assessed/performed Cognition: No apparent impairments  Following commands: Intact       Cueing  General Comments   Cueing Techniques: Verbal cues      Exercises     Shoulder Instructions      Home Living Family/patient expects to be discharged to:: Private residence Living Arrangements: Spouse/significant other (wife) Available Help at Discharge: Available PRN/intermittently Type of Home: House Home Access: Level entry     Home Layout: One level     Bathroom Shower/Tub: Tub/shower unit;Walk-in shower         Home Equipment: None   Additional Comments: Pt lives with wife, retired, wife works      Prior Functioning/Environment Prior Level of Function : Independent/Modified Independent             Mobility Comments: ind ADLs Comments: ind, but frequent migraines limit    OT Problem List: Decreased activity tolerance   OT Treatment/Interventions:        OT Goals(Current goals can be found in the care  plan section)   Acute Rehab OT Goals Patient Stated Goal: to return home unless more medical scans can be done to find out what happened OT Goal Formulation: With patient/family Time For Goal Achievement: 02/10/24 Potential to Achieve Goals: Good   OT Frequency:       Co-evaluation              AM-PAC OT 6 Clicks Daily Activity     Outcome Measure Help from another person eating meals?: None Help from another person taking care of personal grooming?: None Help from another person toileting, which includes using toliet, bedpan, or urinal?: None Help from another person bathing (including washing, rinsing, drying)?: None Help from another person to put on and taking off regular upper body clothing?: None Help from another person to put on and taking off regular lower body clothing?: None 6 Click Score: 24   End of Session Nurse Communication: Mobility status  Activity Tolerance: Patient tolerated treatment well Patient left: in chair;with call bell/phone within reach;with family/visitor present  OT Visit Diagnosis: Muscle weakness (generalized) (M62.81)                Time: 1321-1340 OT Time Calculation (min): 19 min Charges:  OT General Charges $OT Visit: 1 Visit OT Evaluation $OT Eval Low Complexity: 1 Low  [address] OTR/L   Elouise JONELLE Bott 01/27/2024, 1:57 PM

## 2024-01-27 NOTE — ED Notes (Signed)
 Pt found to be fully dressed and off of all monitoring. Pt inquiring if he can be discharged. PT was informed that he is being admitted, and that the hospitalist would be around to speak with him.

## 2024-01-27 NOTE — ED Triage Notes (Addendum)
 Patient went to bed at 3:30am this morning. Woke up at 6:30am and said the left side of his body has a different sensation in his arm only. He stated he is having trouble balancing and feels weaker on the left side. Feels disoriented, cannot concentrate per wife. Amjad, PA at bedside in triage stated to not activate code stroke right now.

## 2024-01-27 NOTE — Progress Notes (Signed)
  PT Cancellation Note  Patient Details Name: Arnold Kester MRN: [id number] DOB: 1956-01-29   Cancelled Treatment:    Reason Eval/Treat Not Completed: PT screened, no needs identified, will sign off. Darice Potters PT Acute Rehabilitation Services Office [phone]    Potters Darice Norris 01/27/2024, 2:21 PM

## 2024-01-27 NOTE — ED Notes (Addendum)
 PT ambulatory to restroom with steady gait. PT report that his LEFT LEG does not feel normal, but there is no obvious abnormality with pts gait.

## 2024-01-27 NOTE — ED Provider Notes (Signed)
 [place] EMERGENCY DEPARTMENT AT [facility] Provider Note   CSN: [id number] Arrival date & time: 01/27/24  9280     Patient presents with: Weakness   Rydell Wiegel is a 68 y.o. male.   Patient presented with weakness on the left side especially the left leg and confusion that started around 3 AM this morning.  The history is provided by medical records and the patient.  Weakness Severity:  Mild Onset quality:  Sudden Timing:  Constant Progression:  Waxing and waning Chronicity:  New Relieved by:  Nothing Worsened by:  Nothing Ineffective treatments:  None tried Associated symptoms: no abdominal pain, no chest pain, no cough, no diarrhea, no frequency, no headaches and no seizures        Prior to Admission medications   Medication Sig Start Date End Date Taking? Authorizing Provider  albuterol  (VENTOLIN  HFA) 108 (90 Base) MCG/ACT inhaler Inhale 2 puffs into the lungs every 6 (six) hours as needed for wheezing or shortness of breath. 10/23/22  Yes Parrett, Tammy S, NP  ALPRAZolam  (XANAX ) 0.5 MG tablet Take 0.5 mg by mouth every 6 (six) hours. 06/04/23  Yes [provider]  apixaban  (ELIQUIS ) 5 MG TABS tablet Take 1 tablet (5 mg total) by mouth 2 (two) times daily. 01/20/24  Yes Patwardhan, Manish J, MD  Atogepant  (QULIPTA ) 60 MG TABS Take 1 tablet (60 mg total) by mouth daily. 10/02/23  Yes Ines Onetha NOVAK, MD  botulinum toxin Type A  (BOTOX ) 200 units injection INJECT 155 UNITS INTRAMUSCULARLY TO HEAD AND NECK EVERY 3 MONTHS  (DISCARD UNUSED AFTER FIRST USE) Patient taking differently: Inject 155 Units into the muscle every 3 (three) months. INJECT 155 UNITS INTRAMUSCULARLY TO HEAD AND NECK EVERY 3 MONTHS  (DISCARD UNUSED AFTER FIRST USE) 06/24/23  Yes Ines Onetha NOVAK, MD  BREO ELLIPTA  100-25 MCG/ACT AEPB INHALE 1 PUFF BY MOUTH EVERY DAY Patient taking differently: Inhale 1 puff into the lungs daily. 11/11/23  Yes Shelah Lamar RAMAN, MD  doxazosin  (CARDURA ) 8 MG  tablet Take 8 mg by mouth daily.   Yes [provider]  EMGALITY  120 MG/ML SOAJ INJECT SUBCUTANEOUSLY THE  CONTENTS OF 1 PEN (120 MG)  MONTHLY AS MAINTENANCE DOSE Patient taking differently: Inject 120 mg into the skin every 30 (thirty) days. 02/06/23  Yes Ines Onetha B, MD  EPINEPHrine 0.3 mg/0.3 mL IJ SOAJ injection Inject 0.3 mg into the muscle as needed. 10/07/22  Yes [provider]  escitalopram  (LEXAPRO ) 10 MG tablet Take 10 mg by mouth in the morning. 10/26/14  Yes [provider]  gabapentin  (NEURONTIN ) 400 MG capsule Take 400 mg by mouth 4 (four) times daily.    Yes [provider]  memantine  (NAMENDA ) 10 MG tablet TAKE 2 TABLETS BY MOUTH IN THE MORNING AND 1 TABLET IN THE LATE AFTERNOON Patient taking differently: Take 20 mg by mouth See admin instructions. TAKE 2 TABLETS BY MOUTH IN THE MORNING AND 1 TABLET IN THE LATE AFTERNOON 11/06/22  Yes Ines Onetha NOVAK, MD  Nerve Stimulator (NERIVIO) DEVI USE WITHIN 1 HR OF MIGRAINE. SET A STRONG YET COMFORTABLE INTENSITY LEVEL & MAINTAIN LEVEL FOR 45 MINS 01/23/23  Yes Ines Onetha NOVAK, MD  pantoprazole  (PROTONIX ) 40 MG tablet Take 40 mg by mouth daily. 12/16/23  Yes [provider]  rosuvastatin  (CRESTOR ) 20 MG tablet Take 1 tablet (20 mg total) by mouth daily. 11/04/23  Yes Patwardhan, Manish J, MD  silodosin  (RAPAFLO ) 8 MG CAPS capsule Take 8 mg  by mouth daily with breakfast.   Yes [provider]  Ubrogepant  (UBRELVY ) 100 MG TABS TAKE 1 TABLET MOUTH AS NEEDED AS CLOSE TO MIGRAINE ONSET AS POSSIBLE. MAY REPEAT IN 2 HOURS IF NEEDED. MAX 200 MG PER 24 HOURS. 10/02/23  Yes Ines Onetha NOVAK, MD  COVID-19 mRNA vaccine, Pfizer, (COMIRNATY ) syringe Inject into the muscle. 07/15/23       Allergies: Plasticized base [plastibase], Tetracycline, Triptans, Effexor [venlafaxine hcl], Prednisone, and Sulfamethoxazole-trimethoprim    Review of Systems  Constitutional:  Negative for appetite change and  fatigue.  HENT:  Negative for congestion, ear discharge and sinus pressure.   Eyes:  Negative for discharge.  Respiratory:  Negative for cough.   Cardiovascular:  Negative for chest pain.  Gastrointestinal:  Negative for abdominal pain and diarrhea.  Genitourinary:  Negative for frequency and hematuria.  Musculoskeletal:  Negative for back pain.  Skin:  Negative for rash.  Neurological:  Positive for weakness. Negative for seizures and headaches.  Psychiatric/Behavioral:  Negative for hallucinations.     Updated Vital Signs BP (!) 107/96 (BP Location: Left Arm)   Pulse (!) 58   Temp (!) 97.5 F (36.4 C) (Oral)   Resp 18   Ht 5' 6 (1.676 m)   Wt 68 kg   SpO2 97%   BMI 24.21 kg/m   Physical Exam Vitals and nursing note reviewed.  Constitutional:      Appearance: He is well-developed.  HENT:     Head: Normocephalic.     Nose: Nose normal.  Eyes:     General: No scleral icterus.    Conjunctiva/sclera: Conjunctivae normal.  Neck:     Thyroid: No thyromegaly.  Cardiovascular:     Rate and Rhythm: Normal rate and regular rhythm.     Heart sounds: No murmur heard.    No friction rub. No gallop.  Pulmonary:     Breath sounds: No stridor. No wheezing or rales.  Chest:     Chest wall: No tenderness.  Abdominal:     General: There is no distension.     Tenderness: There is no abdominal tenderness. There is no rebound.  Musculoskeletal:        General: Normal range of motion.     Cervical back: Neck supple.     Comments: Mild weakness in left leg  Lymphadenopathy:     Cervical: No cervical adenopathy.  Skin:    Findings: No erythema or rash.  Neurological:     Mental Status: He is alert and oriented to person, place, and time.     Motor: No abnormal muscle tone.     Coordination: Coordination normal.  Psychiatric:        Behavior: Behavior normal.     (all labs ordered are listed, but only abnormal results are displayed) Labs Reviewed  PROTIME-INR - Abnormal;  Notable for the following components:      Result Value   Prothrombin Time 16.5 (*)    INR 1.3 (*)    All other components within normal limits  URINE DRUG SCREEN - Abnormal; Notable for the following components:   Benzodiazepines POSITIVE (*)    All other components within normal limits  I-STAT CHEM 8, ED - Abnormal; Notable for the following components:   Potassium 3.4 (*)    All other components within normal limits  RESP PANEL BY RT-PCR (RSV, FLU A&B, COVID)  RVPGX2  C DIFFICILE QUICK SCREEN W PCR REFLEX    ETHANOL  APTT  CBC  DIFFERENTIAL  COMPREHENSIVE METABOLIC PANEL WITH GFR  HEMOGLOBIN A1C  URINALYSIS, ROUTINE W REFLEX MICROSCOPIC  LIPID PANEL  HIV ANTIBODY (ROUTINE TESTING W REFLEX)  CBC  COMPREHENSIVE METABOLIC PANEL WITH GFR  PROTIME-INR  CBG MONITORING, ED    EKG: None  Radiology: ECHOCARDIOGRAM COMPLETE BUBBLE STUDY Result Date: 01/27/2024    ECHOCARDIOGRAM REPORT   Patient Name:   TAYDON NASWORTHY Date of Exam: 01/27/2024 Medical Rec #:  [id number]      Height:       66.0 in Accession #:    [id number]     Weight:       150.0 lb Date of Birth:  1955-11-26      BSA:          1.770 m Patient Age:    68 years       BP:           147/72 mmHg Patient Gender: M              HR:           52 bpm. Exam Location:  Inpatient Procedure: 2D Echo, Cardiac Doppler, Color Doppler, Saline Contrast Bubble Study            and Intracardiac Opacification Agent (Both Spectral and Color Flow            Doppler were utilized during procedure). Indications:    TIA  History:        Patient has prior history of Echocardiogram examinations, most                 recent 10/29/2023. Previous Myocardial Infarction and CAD; Risk                 Factors:Hypertension. H/O Hyperlipidemia. Emphysema.  Sonographer:    BERNARDA ROCKS Referring Phys: [id number] DAVID MANUEL ORTIZ IMPRESSIONS  1. No apical thrombus with Definity  contrast. Left ventricular ejection fraction, by estimation, is 55 to 60%. The left  ventricle has normal function. The left ventricle demonstrates regional wall motion abnormalities (see scoring diagram/findings for description). Left ventricular diastolic parameters are consistent with Grade I diastolic dysfunction (impaired relaxation).  2. Right ventricular systolic function is normal. The right ventricular size is normal. There is normal pulmonary artery systolic pressure. The estimated right ventricular systolic pressure is 29.6 mmHg.  3. Left atrial size was moderately dilated.  4. The mitral valve is normal in structure. No evidence of mitral valve regurgitation. No evidence of mitral stenosis.  5. The aortic valve is tricuspid. Aortic valve regurgitation is not visualized. No aortic stenosis is present.  6. The inferior vena cava is normal in size with greater than 50% respiratory variability, suggesting right atrial pressure of 3 mmHg.  7. Agitated saline contrast bubble study was negative, with no evidence of any interatrial shunt. Conclusion(s)/Recommendation(s): No intracardiac source of embolism detected on this transthoracic study. Consider a transesophageal echocardiogram to exclude cardiac source of embolism if clinically indicated. FINDINGS  Left Ventricle: No apical thrombus with Definity  contrast. Left ventricular ejection fraction, by estimation, is 55 to 60%. The left ventricle has normal function. The left ventricle demonstrates regional wall motion abnormalities. Definity  contrast agent was given IV to delineate the left ventricular endocardial borders. The left ventricular internal cavity size was normal in size. There is no left ventricular hypertrophy. Left ventricular diastolic parameters are consistent with Grade I diastolic dysfunction (impaired relaxation).  LV Wall Scoring: The entire apex is akinetic. Right Ventricle: The right ventricular size is normal.  No increase in right ventricular wall thickness. Right ventricular systolic function is normal. There is normal  pulmonary artery systolic pressure. The tricuspid regurgitant velocity is 2.58 m/s, and  with an assumed right atrial pressure of 3 mmHg, the estimated right ventricular systolic pressure is 29.6 mmHg. Left Atrium: Left atrial size was moderately dilated. Right Atrium: Right atrial size was normal in size. Pericardium: There is no evidence of pericardial effusion. Mitral Valve: The mitral valve is normal in structure. No evidence of mitral valve regurgitation. No evidence of mitral valve stenosis. MV peak gradient, 5.3 mmHg. The mean mitral valve gradient is 2.0 mmHg. Tricuspid Valve: The tricuspid valve is normal in structure. Tricuspid valve regurgitation is trivial. No evidence of tricuspid stenosis. Aortic Valve: The aortic valve is tricuspid. Aortic valve regurgitation is not visualized. No aortic stenosis is present. Aortic valve mean gradient measures 3.0 mmHg. Aortic valve peak gradient measures 7.7 mmHg. Aortic valve area, by VTI measures 3.27 cm. Pulmonic Valve: The pulmonic valve was normal in structure. Pulmonic valve regurgitation is not visualized. No evidence of pulmonic stenosis. Aorta: The aortic root is normal in size and structure. Venous: The inferior vena cava is normal in size with greater than 50% respiratory variability, suggesting right atrial pressure of 3 mmHg. IAS/Shunts: No atrial level shunt detected by color flow Doppler. Agitated saline contrast was given intravenously to evaluate for intracardiac shunting. Agitated saline contrast bubble study was negative, with no evidence of any interatrial shunt.  LEFT VENTRICLE PLAX 2D LVIDd:         3.80 cm      Diastology LVIDs:         2.60 cm      LV e' medial:    6.09 cm/s LV PW:         0.80 cm      LV E/e' medial:  18.1 LV IVS:        0.90 cm      LV e' lateral:   10.10 cm/s LVOT diam:     2.10 cm      LV E/e' lateral: 10.9 LV SV:         107 LV SV Index:   60 LVOT Area:     3.46 cm  LV Volumes (MOD) LV vol d, MOD A2C: 164.0 ml LV vol d,  MOD A4C: 158.0 ml LV vol s, MOD A2C: 78.6 ml LV vol s, MOD A4C: 61.9 ml LV SV MOD A2C:     85.4 ml LV SV MOD A4C:     158.0 ml LV SV MOD BP:      92.1 ml RIGHT VENTRICLE             IVC RV Basal diam:  3.80 cm     IVC diam: 2.00 cm RV S prime:     13.40 cm/s TAPSE (M-mode): 2.0 cm LEFT ATRIUM             Index        RIGHT ATRIUM           Index LA diam:        3.80 cm 2.15 cm/m   RA Area:     19.00 cm LA Vol (A2C):   71.0 ml 40.12 ml/m  RA Volume:   54.60 ml  30.85 ml/m LA Vol (A4C):   85.9 ml 48.54 ml/m LA Biplane Vol: 84.4 ml 47.69 ml/m  AORTIC VALVE  PULMONIC VALVE AV Area (Vmax):    3.44 cm     PV Vmax:          1.08 m/s AV Area (Vmean):   3.03 cm     PV Peak grad:     4.6 mmHg AV Area (VTI):     3.27 cm     PR End Diast Vel: 1.02 msec AV Vmax:           139.00 cm/s AV Vmean:          84.800 cm/s AV VTI:            0.326 m AV Peak Grad:      7.7 mmHg AV Mean Grad:      3.0 mmHg LVOT Vmax:         138.00 cm/s LVOT Vmean:        74.100 cm/s LVOT VTI:          0.308 m LVOT/AV VTI ratio: 0.94  AORTA Ao Root diam: 3.30 cm Ao Asc diam:  3.30 cm MITRAL VALVE                TRICUSPID VALVE MV Area (PHT): 3.10 cm     TR Peak grad:   26.6 mmHg MV Area VTI:   2.70 cm     TR Vmax:        258.00 cm/s MV Peak grad:  5.3 mmHg MV Mean grad:  2.0 mmHg     SHUNTS MV Vmax:       1.15 m/s     Systemic VTI:  0.31 m MV Vmean:      59.1 cm/s    Systemic Diam: 2.10 cm MV E velocity: 110.00 cm/s MV A velocity: 85.90 cm/s MV E/A ratio:  1.28 Oneil Parchment MD Electronically signed by Oneil Parchment MD Signature Date/Time: 01/27/2024/4:36:03 PM    Final    DG Chest 2 View Result Date: 01/27/2024 CLINICAL DATA:  Weakness. EXAM: CHEST - 2 VIEW COMPARISON:  01/18/2024 FINDINGS: Stable hyperinflation and emphysema. Improved hazy opacity at the right lung base. Blunting of the costophrenic angles may be due to small effusions or hyperinflation. No acute airspace disease. No pneumothorax. No pulmonary edema. Stable  heart size and mediastinal contours. Coronary stent again seen. No acute osseous finding. IMPRESSION: 1. Improved hazy opacity at the right lung base. 2. Stable hyperinflation and emphysema. 3. Blunting of the costophrenic angles may be due to small effusions or hyperinflation. Electronically Signed   By: Andrea Gasman M.D.   On: 01/27/2024 12:29   MR BRAIN WO CONTRAST Result Date: 01/27/2024 CLINICAL DATA:  Left-sided weakness and paresthesias EXAM: MRI HEAD WITHOUT CONTRAST TECHNIQUE: Multiplanar, multiecho pulse sequences of the brain and surrounding structures were obtained without intravenous contrast. COMPARISON:  None Available. FINDINGS: MRI brain: The brain volume is normal. The signal in the brain parenchyma is normal. There is no acute or chronic infarct. The ventricles are normal. No mass lesion. There are normal flow signals in the carotid arteries and basilar artery. No significant bone marrow signal abnormality. No significant abnormality in the paranasal sinuses or soft tissues. IMPRESSION: Normal Electronically Signed   By: Nancyann Burns M.D.   On: 01/27/2024 09:52   CT HEAD CODE STROKE WO CONTRAST Result Date: 01/27/2024 EXAM: CT HEAD WITHOUT CONTRAST 01/27/2024 07:52:39 AM TECHNIQUE: CT of the head was performed without the administration of intravenous contrast. Automated exposure control, iterative reconstruction, and/or weight based adjustment of the mA/kV was utilized to reduce  the radiation dose to as low as reasonably achievable. COMPARISON: CT of the head dated 08/20/2021. CLINICAL HISTORY: Neuro deficit, acute, stroke suspected. Patient went to bed at 3:30am this morning. Woke up at 6:30am and said the left side of his body has a different sensation. He stated he is having trouble balancing and feels weaker. Feels disoriented, cannot concentrate. FINDINGS: BRAIN AND VENTRICLES: No acute hemorrhage. No evidence of acute infarct. No hydrocephalus. No extra-axial collection. No mass  effect or midline shift. [place] Stroke Program Early CT (ASPECT) score: Ganglionic (caudate, IC, Lentiform Nucleus, insula, M1-M3): 7. Supraganglionic (M4-M6): 3. Total: 10. These findings were discussed with Dr. Dyllan Hughett at 07:58 AM 01/27/2024. ORBITS: No acute abnormality. SINUSES: No acute abnormality. SOFT TISSUES AND SKULL: No acute soft tissue abnormality. No skull fracture. IMPRESSION: 1. No acute intracranial abnormality. 2. ASPECTS score 10. 3. Findings discussed with Dr. Roda Lauture at 07:58 am on 01/27/2024. Electronically signed by: Evalene Coho MD 01/27/2024 08:20 AM EDT RP Workstation: [id number]     Procedures   Medications Ordered in the ED   stroke: early stages of recovery book (has no administration in time range)  acetaminophen  (TYLENOL ) tablet 650 mg (has no administration in time range)    Or  acetaminophen  (TYLENOL ) suppository 650 mg (has no administration in time range)  ondansetron  (ZOFRAN ) tablet 4 mg (has no administration in time range)    Or  ondansetron  (ZOFRAN ) injection 4 mg (has no administration in time range)  apixaban  (ELIQUIS ) tablet 5 mg (has no administration in time range)  Atogepant  TABS 60 mg (has no administration in time range)  fluticasone  furoate-vilanterol (BREO ELLIPTA ) 100-25 MCG/ACT 1 puff (1 puff Inhalation Not Given 01/27/24 1621)  doxazosin  (CARDURA ) tablet 8 mg (8 mg Oral Given 01/27/24 1644)  escitalopram  (LEXAPRO ) tablet 10 mg (10 mg Oral Given 01/27/24 1645)  gabapentin  (NEURONTIN ) capsule 400 mg (has no administration in time range)  memantine  (NAMENDA ) tablet 20 mg (has no administration in time range)  pantoprazole  (PROTONIX ) EC tablet 40 mg (40 mg Oral Given 01/27/24 1644)  rosuvastatin  (CRESTOR ) tablet 20 mg (20 mg Oral Given 01/27/24 1644)  tamsulosin (FLOMAX) capsule 0.4 mg (has no administration in time range)  perflutren  lipid microspheres (DEFINITY ) IV suspension (4 mLs Intravenous Given 01/27/24 1556)  memantine  (NAMENDA ) tablet 10  mg (has no administration in time range)  ALPRAZolam  (XANAX ) tablet 0.5 mg (has no administration in time range)  CRITICAL CARE Performed by: Fairy Sermon Total critical care time: 45 minutes Critical care time was exclusive of separately billable procedures and treating other patients. Critical care was necessary to treat or prevent imminent or life-threatening deterioration. Critical care was time spent personally by me on the following activities: development of treatment plan with patient and/or surrogate as well as nursing, discussions with consultants, evaluation of patient's response to treatment, examination of patient, obtaining history from patient or surrogate, ordering and performing treatments and interventions, ordering and review of laboratory studies, ordering and review of radiographic studies, pulse oximetry and re-evaluation of patient's condition.                                   Medical Decision Making Amount and/or Complexity of Data Reviewed Labs: ordered. Radiology: ordered.  Risk Decision regarding hospitalization.   Patient with TIA symptoms.  He is admitted to medicine with neurology consult     Final diagnoses:  TIA (transient ischemic attack)  ED Discharge Orders     None          Suzette Pac, MD 01/27/24 1722

## 2024-01-27 NOTE — Consult Note (Signed)
 [facility] Tele[facility]urology Consult Services   Patient Name:   Kyle Walters, Kyle Walters Date of Birth:   08-Feb-1956 Identification Number:   MRN - [id number] Date of Service:   01/27/2024 07:47:52  Diagnosis:       I63.89 - Cerebrovascular accident (CVA) due to other mechanism [facility])  Impression:      81M presented after he woke up with leaning to one side with left weakness. Not a thrombolytic candidate 2/2 LKN >4.5 hrs. Not an IR candidate 2/2 NIH low (no severely newly debilitating symptoms and LVO not suspected), CTA (or alternative vessel imaging) can be done routinely. Rec toxic metabolic infectious workup (U/A, COVID/flu +/-RSV, chest xray etc as per ED MD/primary team discretion), rec admission for CVA workup with MRI brain wo and TTE with bubble (if not done in the past 2-3 months). Rec therapies to see.  Our recommendations are outlined below.  Recommendations:        Stroke/Telemetry Floor       Neuro Checks (Q4)       Bedside Swallow Eval       DVT Prophylaxis       IV Fluids, Normal Saline       Head of Bed 30 Degrees       Euglycemia and Avoid Hyperthermia (PRN Acetaminophen )       Hold Anticoagulation for Now       Initiate or continue Aspirin  325 MG daily       Antihypertensives PRN if Blood pressure is greater than 220/120 or there is a concern for End organ damage/contraindications for permissive HTN. If blood pressure is greater than 220/120 give labetalol PO or IV or Vasotec IV with a goal of 15% reduction in BP during the first 24 hours.  Sign Out:       Discussed with Emergency Department Provider    ------------------------------------------------------------------------------  Advanced Imaging: Advanced Imaging Deferred because:  Advanced Imaging not obtained at this time. Reason: NIH low (no severely newly debilitating symptoms and LVO not suspected), CTA (or alternative vessel imaging) can be done routinely   Metrics: Last Known Well:  01/27/2024 03:00:00 Dispatch Time: 01/27/2024 07:47:52 Arrival Time: 01/27/2024 07:19:00 Initial Response Time: 01/27/2024 07:58:30 Symptoms: leaning to one side. Initial patient interaction: 01/27/2024 07:59:29 NIHSS Assessment Completed: 01/27/2024 08:05:57 Patient is not a candidate for Thrombolytic. Thrombolytic Medical Decision: 01/27/2024 08:05:58 Patient was not deemed candidate for Thrombolytic because of following reasons: LKW outside 4.5 hr window. .  CT Head: I personally reviewed all the CT images that were available to me and it showed: no acute pathology  Primary Provider Notified of Diagnostic Impression and Management Plan on: 01/27/2024 08:33:54    ------------------------------------------------------------------------------  History of Present Illness: Patient is a 68 year old Male.  Patient was brought by private transportation with symptoms of leaning to one side. 81M presented after he woke up with leaning to one side with left weakness. He noticed this while trying to go to the restroom. No double or blurred vision. Left side is weak (not as good as the right). Never had symptoms like this before.  No missed dose of Eliquis  recently. Took Eliquis  this am.  Has PNA and was on augmentin and Zpack. Has had diarrhea recently.  No recent falls or head injuries.  LKN: 3am before bed. Everything was ok before.   Past Medical History:      Hyperlipidemia      Migraine Headaches      There is no history of Stroke Other  PMH:  blood clot in the heart. COPD, anxiety and depression, OSA  Medications:  Anticoagulant use:  Yes Eliquis  No Antiplatelet use Reviewed EMR for current medications  Allergies:  Reviewed  Social History: Smoking: Former Alcohol Use: No Drug Use: No  Family History:  There is no family history of premature cerebrovascular disease pertinent to this consultation  ROS : 14 Points Review of Systems was performed and was  negative except mentioned in HPI.  Past Surgical History: There Is No Surgical History Contributory To Today's Visit    Examination: BP(146/72), Pulse(52), Blood Glucose(92) 1A: Level of Consciousness - Alert; keenly responsive + 0 1B: Ask Month and Age - Both Questions Right + 0 1C: Blink Eyes & Squeeze Hands - Performs Both Tasks + 0 2: Test Horizontal Extraocular Movements - Normal + 0 3: Test Visual Fields - No Visual Loss + 0 4: Test Facial Palsy (Use Grimace if Obtunded) - Normal symmetry + 0 5A: Test Left Arm Motor Drift - No Drift for 10 Seconds + 0 5B: Test Right Arm Motor Drift - No Drift for 10 Seconds + 0 6A: Test Left Leg Motor Drift - No Drift for 5 Seconds + 0 6B: Test Right Leg Motor Drift - No Drift for 5 Seconds + 0 7: Test Limb Ataxia (FNF/Heel-Shin) - No Ataxia + 0 8: Test Sensation - Mild-Moderate Loss: Less Sharp/More Dull + 1 9: Test Language/Aphasia - Normal; No aphasia + 0 10: Test Dysarthria - Normal + 0 11: Test Extinction/Inattention - No abnormality + 0  NIHSS Score: 1  NIHSS Free Text : left face numbness  Pre-Morbid Modified Rankin Scale: 1 Points = No significant disability despite symptoms; able to carry out all usual duties and activities  Spoke with : Dr. Suzette  This consult was conducted in real time using interactive audio and Immunologist. Patient was informed of the technology being used for this visit and agreed to proceed. Patient located in hospital and provider located at home/office setting.   Patient is being evaluated for possible acute neurologic impairment and high probability of imminent or life-threatening deterioration. I spent total of 25 minutes providing care to this patient, including time for face to face visit via telemedicine, review of medical records, imaging studies and discussion of findings with providers, the patient and/or family.    Dr Idelia Haws   [facility] For Inpatient follow-up with  [facility] physician please call [facility] at [phone]. As we are not an outpatient service for any post hospital discharge needs please contact the hospital for assistance. If you have any questions for the [facility] physicians or need to reconsult for clinical or diagnostic changes please contact us  via [facility] at [phone].   Signature : [employer]

## 2024-01-27 NOTE — H&P (Signed)
 History and Physical    Patient: Kyle Walters FMW:[id number] DOB: [birth year] DOA: 01/27/2024 DOS: the patient was seen and examined on 01/27/2024 PCP: Sim Emery CROME, MD  Patient coming from: Home  Chief Complaint:  Chief Complaint  Patient presents with   Weakness   HPI: Kyle Walters is a 69 y.o. male with medical history significant of ADHD, seasonal allergies, unspecified anemia, anxiety, depression, panic attacks, osteoarthritis, COPD, CAD, history of MI, history of stent placement, BPH, fibromyalgia, GERD, hyperlipidemia, hypertension, irritable bowel syndrome, history of C. difficile colitis, recently treated as an outpatient for CAP, migraine headaches who presented to the emergency department with left sided weakness.  He woke up around 0630 this morning and felt that his left leg was unbalanced.  No vision changes, dizziness or vertigo sensation. He denied fever, chills, rhinorrhea, sore throat, wheezing or hemoptysis.  No chest pain, palpitations, diaphoresis, PND, orthopnea or pitting edema of the lower extremities.  No abdominal pain, nausea, emesis, constipation, melena or hematochezia but has been having diarrhea since he took Augmentin and Zithromax in recent days for pneumonia treatment.  No flank pain, dysuria, frequency or hematuria.  No polyuria, polydipsia, polyphagia or blurred vision.   Lab work: UDS is positive for benzodiazepines.  CBC was normal.  PT 16.5, INR 1.3 and PTT 35.  Unremarkable CMP.  Imaging: CT head code stroke with no acute intracranial normality.  MRI of the brain was normal.   ED course: Initial vital signs were temperature 98 F, pulse 52, respirations 16, BP 152/83 mmHg and O2 sat 100% on room air.  Teleneurology was consulted who recommended observation with TIA/CVA workup.  Review of Systems: As mentioned in the history of present illness. All other systems reviewed and are negative. Past Medical History:  Diagnosis Date   ADHD (attention  deficit hyperactivity disorder)    Allergy    Anemia    Anxiety panic attack   Arthritis    COPD (chronic obstructive pulmonary disease) (HCC)    bronchitis/emphysema   Coronary artery disease    Depression    Emphysema lung (HCC)    Emphysema of lung (HCC)    Enlarged prostate    Fibromyalgia    GERD (gastroesophageal reflux disease)    Hyperlipidemia    Hypertension    IBS (irritable bowel syndrome)    Migraine    Myocardial infarction (HCC)    Panic attack    Past Surgical History:  Procedure Laterality Date   CARDIAC CATHETERIZATION     CORONARY STENT INTERVENTION N/A 03/14/2021   Procedure: CORONARY STENT INTERVENTION;  Surgeon: Dann Candyce RAMAN, MD;  Location: [facility] INVASIVE CV LAB;  Service: Cardiovascular;  Laterality: N/A;   LEFT HEART CATH AND CORONARY ANGIOGRAPHY N/A 03/14/2021   Procedure: LEFT HEART CATH AND CORONARY ANGIOGRAPHY;  Surgeon: Dann Candyce RAMAN, MD;  Location: [facility] INVASIVE CV LAB;  Service: Cardiovascular;  Laterality: N/A;   NO PAST SURGERIES     Social History:  reports that he quit smoking about 10 years ago. His smoking use included cigarettes. He started smoking about 50 years ago. He has a 60 pack-year smoking history. He has never used smokeless tobacco. He reports that he does not drink alcohol and does not use drugs.  Allergies  Allergen Reactions   Plasticized Base [Plastibase] Swelling    Swelling mouth, eye lids, bridge of nose and under eyes.  (Plastic bottles, plastic sleeve over thermometer, straws)   Tetracycline Anaphylaxis and Other (See Comments)   Triptans  Contraindicated d/t h/o MI    Effexor [Venlafaxine Hcl] Rash   Prednisone Other (See Comments)    Unable to sleep    Sulfamethoxazole-Trimethoprim Nausea Only    Family History  Problem Relation Age of Onset   Heart disease Mother    Breast cancer Mother    Diabetes Mother    Osteoarthritis Mother    Stroke Mother    Other Father        stomach ulcer    Heart disease Father    Bladder Cancer Father    Diabetes Father    Stroke Father    Hypertension Father    Pancreatic cancer Father    Migraines Father    Irritable bowel syndrome Father    Hypertension Brother    Other Brother        stomach ulcer   Colon cancer Neg Hx    Rectal cancer Neg Hx    Esophageal cancer Neg Hx    Stomach cancer Neg Hx     Prior to Admission medications   Medication Sig Start Date End Date Taking? Authorizing Provider  albuterol  (VENTOLIN  HFA) 108 (90 Base) MCG/ACT inhaler Inhale 2 puffs into the lungs every 6 (six) hours as needed for wheezing or shortness of breath. 10/23/22  Yes Parrett, Tammy S, NP  ALPRAZolam  (XANAX ) 0.5 MG tablet Take 0.5 mg by mouth every 6 (six) hours. 06/04/23  Yes [provider]  apixaban  (ELIQUIS ) 5 MG TABS tablet Take 1 tablet (5 mg total) by mouth 2 (two) times daily. 01/20/24  Yes Patwardhan, Manish J, MD  Atogepant  (QULIPTA ) 60 MG TABS Take 1 tablet (60 mg total) by mouth daily. 10/02/23  Yes Ines Onetha NOVAK, MD  botulinum toxin Type A  (BOTOX ) 200 units injection INJECT 155 UNITS INTRAMUSCULARLY TO HEAD AND NECK EVERY 3 MONTHS  (DISCARD UNUSED AFTER FIRST USE) Patient taking differently: Inject 155 Units into the muscle every 3 (three) months. INJECT 155 UNITS INTRAMUSCULARLY TO HEAD AND NECK EVERY 3 MONTHS  (DISCARD UNUSED AFTER FIRST USE) 06/24/23  Yes Ines Onetha NOVAK, MD  BREO ELLIPTA  100-25 MCG/ACT AEPB INHALE 1 PUFF BY MOUTH EVERY DAY Patient taking differently: Inhale 1 puff into the lungs daily. 11/11/23  Yes Shelah Lamar RAMAN, MD  doxazosin  (CARDURA ) 8 MG tablet Take 8 mg by mouth daily.   Yes [provider]  EMGALITY  120 MG/ML SOAJ INJECT SUBCUTANEOUSLY THE  CONTENTS OF 1 PEN (120 MG)  MONTHLY AS MAINTENANCE DOSE Patient taking differently: Inject 120 mg into the skin every 30 (thirty) days. 02/06/23  Yes Ines Onetha B, MD  EPINEPHrine 0.3 mg/0.3 mL IJ SOAJ injection Inject 0.3 mg into the muscle as  needed. 10/07/22  Yes [provider]  escitalopram  (LEXAPRO ) 10 MG tablet Take 10 mg by mouth in the morning. 10/26/14  Yes [provider]  gabapentin  (NEURONTIN ) 400 MG capsule Take 400 mg by mouth 4 (four) times daily.    Yes [provider]  memantine  (NAMENDA ) 10 MG tablet TAKE 2 TABLETS BY MOUTH IN THE MORNING AND 1 TABLET IN THE LATE AFTERNOON Patient taking differently: Take 20 mg by mouth See admin instructions. TAKE 2 TABLETS BY MOUTH IN THE MORNING AND 1 TABLET IN THE LATE AFTERNOON 11/06/22  Yes Ines Onetha NOVAK, MD  Nerve Stimulator (NERIVIO) DEVI USE WITHIN 1 HR OF MIGRAINE. SET A STRONG YET COMFORTABLE INTENSITY LEVEL & MAINTAIN LEVEL FOR 45 MINS 01/23/23  Yes Ines Onetha NOVAK, MD  pantoprazole  (PROTONIX ) 40 MG tablet  Take 40 mg by mouth daily. 12/16/23  Yes [provider]  rosuvastatin  (CRESTOR ) 20 MG tablet Take 1 tablet (20 mg total) by mouth daily. 11/04/23  Yes Patwardhan, Manish J, MD  silodosin  (RAPAFLO ) 8 MG CAPS capsule Take 8 mg by mouth daily with breakfast.   Yes [provider]  Ubrogepant  (UBRELVY ) 100 MG TABS TAKE 1 TABLET MOUTH AS NEEDED AS CLOSE TO MIGRAINE ONSET AS POSSIBLE. MAY REPEAT IN 2 HOURS IF NEEDED. MAX 200 MG PER 24 HOURS. 10/02/23  Yes Ines Onetha NOVAK, MD  COVID-19 mRNA vaccine, Pfizer, (COMIRNATY ) syringe Inject into the muscle. 07/15/23       Physical Exam: Vitals:   01/27/24 0727 01/27/24 0800 01/27/24 0830 01/27/24 1105  BP: (!) 152/83 (!) 146/72 138/70 (!) 147/72  Pulse: 97 (!) 52 (!) 49 (!) 46  Resp: 16  10 18   Temp: 98 F (36.7 C)     TempSrc: Oral     SpO2: 100% 96% 96% 96%  Weight: 68 kg     Height: 5' 6 (1.676 m)      Physical Exam Vitals and nursing note reviewed.  Constitutional:      General: He is awake. He is not in acute distress.    Appearance: Normal appearance. He is ill-appearing.  HENT:     Head: Normocephalic.     Nose: No rhinorrhea.     Mouth/Throat:     Mouth: Mucous  membranes are moist.  Eyes:     General: No scleral icterus.    Pupils: Pupils are equal, round, and reactive to light.  Neck:     Vascular: No JVD.  Cardiovascular:     Rate and Rhythm: Normal rate and regular rhythm.     Heart sounds: S1 normal and S2 normal.  Pulmonary:     Effort: Pulmonary effort is normal.     Breath sounds: Normal breath sounds. No wheezing, rhonchi or rales.  Abdominal:     General: Bowel sounds are normal. There is no distension.     Palpations: Abdomen is soft.     Tenderness: There is no abdominal tenderness. There is no right CVA tenderness or left CVA tenderness.  Musculoskeletal:     Cervical back: Neck supple.     Right lower leg: No edema.     Left lower leg: No edema.  Skin:    General: Skin is warm and dry.  Neurological:     General: No focal deficit present.     Mental Status: He is alert and oriented to person, place, and time.  Psychiatric:        Mood and Affect: Mood normal.        Behavior: Behavior is cooperative.     Data Reviewed:  Results are pending, will review when available.  10/29/2023 echocardiogram report. IMPRESSIONS:   1. Left ventricular ejection fraction, by estimation, is 55 to 60%. The  left ventricle has normal function. The left ventricle demonstrates  regional wall motion abnormalities (see scoring diagram/findings for  description). Left ventricular diastolic  parameters were normal. 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 The apical lateral  segment, apical septal segment, apical anterior segment, and apical  inferior segment are akinetic.   2. Right ventricular systolic function is normal. The right ventricular  size is normal.   3. The mitral valve is normal in structure. Trivial mitral valve  regurgitation. No evidence of mitral stenosis.   4. The aortic valve is tricuspid. Aortic valve regurgitation is not  visualized.  Aortic valve sclerosis is present, with no evidence of aortic  valve stenosis.   Comparison(s):  A prior study was performed on 06/05/2023. 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   Conclusion(s)/Recommendation(s): No left ventricular mural or apical  thrombus/thrombi.   EKG: Vent. rate 50 BPM PR interval 154 ms QRS duration 98 ms QT/QTcB 474/433 ms P-R-T axes 75 85 49 Sinus rhythm Anterolateral infarct, age indeterminate  Assessment and Plan: Principal Problem:   TIA (transient ischemic attack) Observation/telemetry. Frequent neurochecks. Consult PT and OT. Check fasting lipids. Check hemoglobin A1c. Check carotid Doppler. Check echocardiogram. Check MRI of brain. Risk factors modifications. Stroke team input appreciated.  Active Problems:   COPD (chronic obstructive pulmonary disease) (HCC) Asymptomatic. Bronchodilators as needed.    History of gastroesophageal reflux (GERD) Continue pantoprazole  40 mg p.o. daily.    Anxiety and depression Continue escitalopram  10 mg p.o. daily. Continue alprazolam  as needed.    Coronary artery disease Continue rosuvastatin  and DOAC.    Hyperlipidemia On rosuvastatin  20 mg p.o. daily.    Left ventricular thrombus Continue Eliquis  5 mg p.o. twice daily.    OSA (obstructive sleep apnea) CPAP at bedtime.    Diarrhea Had a recent course of antibiotics. Has a prior history of C. difficile colitis. Check stool C. difficile toxin and antigen.      Advance Care Planning:   Code Status: Full Code   Consults:   Family Communication:   Severity of Illness: The appropriate patient status for this patient is OBSERVATION. Observation status is judged to be reasonable and necessary in order to provide the required intensity of service to ensure the patient's safety. The patient's presenting symptoms, physical exam findings, and initial radiographic and laboratory data in the context of their medical condition is felt to place them at decreased risk for further  clinical deterioration. Furthermore, it is anticipated that the patient will be medically stable for discharge from the hospital within 2 midnights of admission.   Author: Alm Dorn Castor, MD 01/27/2024 11:32 AM  For on call review [email or web address].   This document was prepared using Dragon voice recognition software and may contain some unintended transcription errors.

## 2024-01-27 NOTE — ED Notes (Addendum)
 Neurology Dr. Alwin connected via video.

## 2024-01-27 NOTE — ED Notes (Signed)
Pt ambulatory in hallway with steady gait.

## 2024-01-28 ENCOUNTER — Observation Stay (HOSPITAL_COMMUNITY)

## 2024-01-28 ENCOUNTER — Encounter (HOSPITAL_COMMUNITY): Payer: Self-pay | Admitting: Internal Medicine

## 2024-01-28 ENCOUNTER — Observation Stay (HOSPITAL_BASED_OUTPATIENT_CLINIC_OR_DEPARTMENT_OTHER)

## 2024-01-28 DIAGNOSIS — G459 Transient cerebral ischemic attack, unspecified: Secondary | ICD-10-CM | POA: Diagnosis not present

## 2024-01-28 DIAGNOSIS — Z8673 Personal history of transient ischemic attack (TIA), and cerebral infarction without residual deficits: Secondary | ICD-10-CM | POA: Diagnosis not present

## 2024-01-28 DIAGNOSIS — I6523 Occlusion and stenosis of bilateral carotid arteries: Secondary | ICD-10-CM | POA: Diagnosis not present

## 2024-01-28 DIAGNOSIS — G319 Degenerative disease of nervous system, unspecified: Secondary | ICD-10-CM | POA: Diagnosis not present

## 2024-01-28 DIAGNOSIS — I672 Cerebral atherosclerosis: Secondary | ICD-10-CM | POA: Diagnosis not present

## 2024-01-28 LAB — COMPREHENSIVE METABOLIC PANEL WITH GFR
ALT: 38 U/L (ref 0–44)
AST: 39 U/L (ref 15–41)
Albumin: 3.7 g/dL (ref 3.5–5.0)
Alkaline Phosphatase: 95 U/L (ref 38–126)
Anion gap: 8 (ref 5–15)
BUN: 14 mg/dL (ref 8–23)
CO2: 28 mmol/L (ref 22–32)
Calcium: 9.1 mg/dL (ref 8.9–10.3)
Chloride: 106 mmol/L (ref 98–111)
Creatinine, Ser: 0.82 mg/dL (ref 0.61–1.24)
GFR, Estimated: >60 mL/min (ref >60)
Glucose, Bld: 85 mg/dL (ref 70–99)
Potassium: 3.9 mmol/L (ref 3.5–5.1)
Sodium: 142 mmol/L (ref 135–145)
Total Bilirubin: 0.5 mg/dL (ref 0.0–1.2)
Total Protein: 6.2 g/dL — ABNORMAL LOW (ref 6.5–8.1)

## 2024-01-28 LAB — LIPID PANEL
Cholesterol: 88 mg/dL (ref 0–200)
HDL: 49 mg/dL (ref >40)
LDL Cholesterol: 34 mg/dL (ref 0–99)
Total CHOL/HDL Ratio: 1.8 ratio
Triglycerides: 28 mg/dL (ref <150)
VLDL: 6 mg/dL (ref 0–40)

## 2024-01-28 LAB — CBC
HCT: 38.7 % — ABNORMAL LOW (ref 39.0–52.0)
Hemoglobin: 12.7 g/dL — ABNORMAL LOW (ref 13.0–17.0)
MCH: 31.4 pg (ref 26.0–34.0)
MCHC: 32.8 g/dL (ref 30.0–36.0)
MCV: 95.8 fL (ref 80.0–100.0)
Platelets: 188 K/uL (ref 150–400)
RBC: 4.04 MIL/uL — ABNORMAL LOW (ref 4.22–5.81)
RDW: 13.3 % (ref 11.5–15.5)
WBC: 5 K/uL (ref 4.0–10.5)
nRBC: 0 % (ref 0.0–0.2)

## 2024-01-28 LAB — HIV ANTIBODY (ROUTINE TESTING W REFLEX): HIV Screen 4th Generation wRfx: NONREACTIVE

## 2024-01-28 LAB — PROTIME-INR
INR: 1.2 (ref 0.8–1.2)
Prothrombin Time: 16.2 s — ABNORMAL HIGH (ref 11.4–15.2)

## 2024-01-28 MED ORDER — SODIUM CHLORIDE (PF) 0.9 % IJ SOLN
INTRAMUSCULAR | Status: AC
  Filled 2024-01-28: qty 50

## 2024-01-28 MED ORDER — IOHEXOL 350 MG/ML SOLN
75.0000 mL | Freq: Once | INTRAVENOUS | Status: AC | PRN
  Administered 2024-01-28: 75 mL via INTRAVENOUS

## 2024-01-28 NOTE — Plan of Care (Signed)

## 2024-01-28 NOTE — TOC Progression Note (Addendum)
 Transition of Care [facility]) - Progression Note    Patient Details  Name: Kyle Walters MRN: [id number] Date of Birth: Jun 06, 1955  [facility] of [facility]) CM/SW Contact  Heather DELENA Saltness, LCSW Phone Number: 01/28/2024, 11:23 AM  Clinical Narrative:     2:25 PM - CSW attempted to give pt MOON at bedside, but pt is currently out-of-room for CT scan. TOC will attempt to give MOON later.  11:34 AM - CSW attempted to give pt MOON at bedside, but pt is currently out-of-room for procedure. TOC will attempt to give MOON later.    Expected Discharge Plan and Services                                               Social Drivers of Health (SDOH) Interventions SDOH Screenings   Food Insecurity: No Food Insecurity (01/27/2024)  Housing: Low Risk  (01/27/2024)  Transportation Needs: No Transportation Needs (01/27/2024)  Utilities: Patient Declined (01/27/2024)  Depression (PHQ2-9): Low Risk  (07/19/2021)  Recent Concern: Depression (PHQ2-9) - Medium Risk (05/24/2021)  Social Connections: Socially Isolated (01/27/2024)  Tobacco Use: Medium Risk (01/27/2024)    Readmission Risk Interventions     No data to display

## 2024-01-28 NOTE — Progress Notes (Signed)
 Carotid artery duplex has been completed.  Results can be found in chart review under CV Proc.  01/28/2024 10:59 AM  A[address] Christen Elden Appl, RVT.

## 2024-01-28 NOTE — Progress Notes (Signed)
   01/28/24 1150  TOC Brief Assessment  Insurance and Status Reviewed  Patient has primary care physician Yes  Home environment has been reviewed single family home  Prior level of function: independent  Prior/Current Home Services No current home services  Social Drivers of Health Review SDOH reviewed no interventions necessary  Readmission risk has been reviewed Yes  Transition of care needs no transition of care needs at this time    Signed: Heather Saltness, MSW, LCSW Clinical Social Worker Inpatient Care Management 01/28/2024 11:50 AM

## 2024-01-28 NOTE — Discharge Summary (Signed)
 Physician Discharge Summary  Kyle Walters FMW:[id number] DOB: [birth year] DOA: 01/27/2024  PCP: Kyle Emery CROME, MD  Admit date: 01/27/2024 Discharge date: 01/28/2024  Admitted From: Home  Discharge disposition: Home   Recommendations for Outpatient Follow-Up:   Follow up with your primary care provider in one week.  Check CBC, BMP, magnesium  in the next visit   Discharge Diagnosis:   Principal Problem:   TIA (transient ischemic attack) Active Problems:   COPD (chronic obstructive pulmonary disease) (HCC)   History of gastroesophageal reflux (GERD)   Anxiety and depression   Coronary artery disease   Hyperlipidemia   Left ventricular thrombus   OSA (obstructive sleep apnea)   Diarrhea    Discharge Condition: Improved.  Diet recommendation: Low sodium, heart healthy.    Wound care: None.  Code status: Full.   History of Present Illness:   Kyle Walters is a 68 y.o. male with medical history significant of ADHD, migraines, seasonal allergies, unspecified anemia, anxiety, depression, panic attacks, osteoarthritis, COPD, CAD, history of MI, history of stent placement, BPH, fibromyalgia, GERD, hyperlipidemia, hypertension, irritable bowel syndrome, history of C. difficile colitis, recently treated as an outpatient for pneumonia presented to the emergency department with left sided weakness.  He woke up around 0630 this morning and felt that his left leg was unbalanced.  He also complains of some diarrhea.  In the ED, vitals were stable.  Lab work was remarkable for UDS is positive for benzodiazepines.  CBC was normal.  PT 16.5, INR 1.3 and PTT 35. CT head code stroke with no acute intracranial normality.  MRI of the brain was normal.  Teleneurology was consulted and patient was admitted hospital for possible TIA/CVA workup.    Hospital Course:   Following conditions were addressed during hospitalization as listed below,  TIA (transient ischemic attack) Was put on  TIA protocol..  Neurological symptoms resolved.  2D echocardiogram showed normal LV thrombus with EF of 55 to 60% with grade 1 diastolic dysfunction.  Negative for interatrial shunt.  Carotid duplex without hemodynamically significant stenosis.  Hemoglobin A1c of 5.1.  MRI of the brain was negative for acute findings.  Discussed with on-call neurology Dr. Matthews.  Recommended CT angiogram of the head and neck which was negative for large vessel occlusion.  Patient is already on Eliquis  so no further antiplatelets were recommended.     COPD (chronic obstructive pulmonary disease) (HCC) Compensated.  Continue bronchodilators on discharge.     History of gastroesophageal reflux (GERD) Continue PPI     Anxiety and depression Continue Xanax  and Zoloft     Coronary artery disease Continue Eliquis  and Crestor , follows up with cardiology as outpatient.     Hyperlipidemia Continue Crestor      Left ventricular thrombus Continue Eliquis  p.o. twice daily, follows up with cardiology as outpatient.     OSA (obstructive sleep apnea) Continue CPAP at bedtime.     Diarrhea Had completed course of antibiotic.  Did have a formed bowel movement show C. difficile testing was not done.  Disposition.  At this time, patient is stable for disposition home with outpatient PCP and cardiology follow-up.  Medical Consultants:   Neurology-verbal consultation  Procedures:    None Subjective:   Today, patient was seen and examined at bedside.  Denies any weakness dizziness lightheadedness or focal deficits.  Discharge Exam:   Vitals:   01/28/24 0857 01/28/24 1218  BP: 136/72 128/64  Pulse: (!) 51 (!) 51  Resp: 17 16  Temp: 98.1 F (  36.7 C) 98.6 F (37 C)  SpO2: 94% 94%   Vitals:   01/28/24 0048 01/28/24 0446 01/28/24 0857 01/28/24 1218  BP: 130/72 (!) 121/50 136/72 128/64  Pulse: (!) 50 (!) 51 (!) 51 (!) 51  Resp: 14 16 17 16   Temp: 97.8 F (36.6 C) 98.5 F (36.9 C) 98.1 F (36.7 C) 98.6  F (37 C)  TempSrc: Oral Oral Oral Oral  SpO2: 97% 95% 94% 94%  Weight:      Height:        General: Alert awake, not in obvious distress HENT: pupils equally reacting to light,  No scleral pallor or icterus noted. Oral mucosa is moist.  Chest:  Clear breath sounds.   No crackles or wheezes.  CVS: S1 &S2 heard. No murmur.  Regular rate and rhythm. Abdomen: Soft, nontender, nondistended.  Bowel sounds are heard.   Extremities: No cyanosis, clubbing or edema.  Peripheral pulses are palpable. Psych: Alert, awake and oriented, normal mood CNS:  No cranial nerve deficits.  Power equal in all extremities.   Skin: Warm and dry.  No rashes noted.  The results of significant diagnostics from this hospitalization (including imaging, microbiology, ancillary and laboratory) are listed below for reference.     Diagnostic Studies:   VAS US  CAROTID Result Date: 01/28/2024 Carotid Arterial Duplex Study Patient Name:  Kyle Walters  Date of Exam:   01/28/2024 Medical Rec #: [id number]       Accession #:    [id number] Date of Birth: 1955-05-07       Patient Gender: M Patient Age:   84 years Exam Location:  [facility] Procedure:      VAS US  CAROTID Referring Phys: ALM ORTIZ --------------------------------------------------------------------------------  Indications:       TIA and H/O MI, stents. Risk Factors:      Hypertension, hyperlipidemia, coronary artery disease. Comparison Study:  No prior exam. Performing Technologist: Edilia Elden Appl  Examination Guidelines: A complete evaluation includes B-mode imaging, spectral Doppler, color Doppler, and power Doppler as needed of all accessible portions of each vessel. Bilateral testing is considered an integral part of a complete examination. Limited examinations for reoccurring indications may be performed as noted.  Right Carotid Findings: +----------+--------+--------+--------+-------------------------+--------+           PSV cm/sEDV  cm/sStenosisPlaque Description       Comments +----------+--------+--------+--------+-------------------------+--------+ CCA Prox  117     26                                                +----------+--------+--------+--------+-------------------------+--------+ CCA Distal111     26                                                +----------+--------+--------+--------+-------------------------+--------+ ICA Prox  122     29              heterogenous and calcific         +----------+--------+--------+--------+-------------------------+--------+ ICA Mid   131     39                                                +----------+--------+--------+--------+-------------------------+--------+  ICA Distal114     32                                                +----------+--------+--------+--------+-------------------------+--------+ ECA       132     24                                                +----------+--------+--------+--------+-------------------------+--------+ +----------+--------+-------+----------------+-------------------+           PSV cm/sEDV cmsDescribe        Arm Pressure (mmHG) +----------+--------+-------+----------------+-------------------+ Subclavian206            Multiphasic, WNL                    +----------+--------+-------+----------------+-------------------+ +---------+--------+--+--------+--+---------+ VertebralPSV cm/s63EDV cm/s19Antegrade +---------+--------+--+--------+--+---------+ Right internal carotid artery the higher end of 39%. Left Carotid Findings: +----------+--------+--------+--------+-------------------------+--------+           PSV cm/sEDV cm/sStenosisPlaque Description       Comments +----------+--------+--------+--------+-------------------------+--------+ CCA Prox  125     33                                                +----------+--------+--------+--------+-------------------------+--------+  CCA Distal130     35                                                +----------+--------+--------+--------+-------------------------+--------+ ICA Prox  89      29              heterogenous and calcific         +----------+--------+--------+--------+-------------------------+--------+ ICA Mid   107     33                                                +----------+--------+--------+--------+-------------------------+--------+ ICA Distal107     32                                                +----------+--------+--------+--------+-------------------------+--------+ ECA       156     25                                                +----------+--------+--------+--------+-------------------------+--------+ +----------+--------+--------+----------------+-------------------+           PSV cm/sEDV cm/sDescribe        Arm Pressure (mmHG) +----------+--------+--------+----------------+-------------------+ [id number]             Multiphasic, WNL                    +----------+--------+--------+----------------+-------------------+ +---------+--------+--+--------+--+---------+ VertebralPSV cm/s59EDV  cm/s17Antegrade +---------+--------+--+--------+--+---------+   Summary: Right Carotid: Velocities in the right ICA are consistent with a 1-39% stenosis. Left Carotid: Velocities in the left ICA are consistent with a 1-39% stenosis. Vertebrals:  Bilateral vertebral arteries demonstrate antegrade flow. Subclavians: Normal flow hemodynamics were seen in bilateral subclavian              arteries. *See table(s) above for measurements and observations.  Electronically signed by Eather Popp MD on 01/28/2024 at 2:35:25 PM.    Final    ECHOCARDIOGRAM COMPLETE BUBBLE STUDY Result Date: 01/27/2024    ECHOCARDIOGRAM REPORT   Patient Name:   Kyle Walters Date of Exam: 01/27/2024 Medical Rec #:  [id number]      Height:       66.0 in Accession #:    [id number]     Weight:        150.0 lb Date of Birth:  1955/08/17      BSA:          1.770 m Patient Age:    68 years       BP:           147/72 mmHg Patient Gender: M              HR:           52 bpm. Exam Location:  Inpatient Procedure: 2D Echo, Cardiac Doppler, Color Doppler, Saline Contrast Bubble Study            and Intracardiac Opacification Agent (Both Spectral and Color Flow            Doppler were utilized during procedure). Indications:    TIA  History:        Patient has prior history of Echocardiogram examinations, most                 recent 10/29/2023. Previous Myocardial Infarction and CAD; Risk                 Factors:Hypertension. H/O Hyperlipidemia. Emphysema.  Sonographer:    BERNARDA ROCKS Referring Phys: [id number] DAVID MANUEL ORTIZ IMPRESSIONS  1. No apical thrombus with Definity  contrast. Left ventricular ejection fraction, by estimation, is 55 to 60%. The left ventricle has normal function. The left ventricle demonstrates regional wall motion abnormalities (see scoring diagram/findings for description). Left ventricular diastolic parameters are consistent with Grade I diastolic dysfunction (impaired relaxation).  2. Right ventricular systolic function is normal. The right ventricular size is normal. There is normal pulmonary artery systolic pressure. The estimated right ventricular systolic pressure is 29.6 mmHg.  3. Left atrial size was moderately dilated.  4. The mitral valve is normal in structure. No evidence of mitral valve regurgitation. No evidence of mitral stenosis.  5. The aortic valve is tricuspid. Aortic valve regurgitation is not visualized. No aortic stenosis is present.  6. The inferior vena cava is normal in size with greater than 50% respiratory variability, suggesting right atrial pressure of 3 mmHg.  7. Agitated saline contrast bubble study was negative, with no evidence of any interatrial shunt. Conclusion(s)/Recommendation(s): No intracardiac source of embolism detected on this transthoracic study.  Consider a transesophageal echocardiogram to exclude cardiac source of embolism if clinically indicated. FINDINGS  Left Ventricle: No apical thrombus with Definity  contrast. Left ventricular ejection fraction, by estimation, is 55 to 60%. The left ventricle has normal function. The left ventricle demonstrates regional wall motion abnormalities. Definity  contrast agent was given IV to delineate the left ventricular endocardial borders. The left ventricular internal  cavity size was normal in size. There is no left ventricular hypertrophy. Left ventricular diastolic parameters are consistent with Grade I diastolic dysfunction (impaired relaxation).  LV Wall Scoring: The entire apex is akinetic. Right Ventricle: The right ventricular size is normal. No increase in right ventricular wall thickness. Right ventricular systolic function is normal. There is normal pulmonary artery systolic pressure. The tricuspid regurgitant velocity is 2.58 m/s, and  with an assumed right atrial pressure of 3 mmHg, the estimated right ventricular systolic pressure is 29.6 mmHg. Left Atrium: Left atrial size was moderately dilated. Right Atrium: Right atrial size was normal in size. Pericardium: There is no evidence of pericardial effusion. Mitral Valve: The mitral valve is normal in structure. No evidence of mitral valve regurgitation. No evidence of mitral valve stenosis. MV peak gradient, 5.3 mmHg. The mean mitral valve gradient is 2.0 mmHg. Tricuspid Valve: The tricuspid valve is normal in structure. Tricuspid valve regurgitation is trivial. No evidence of tricuspid stenosis. Aortic Valve: The aortic valve is tricuspid. Aortic valve regurgitation is not visualized. No aortic stenosis is present. Aortic valve mean gradient measures 3.0 mmHg. Aortic valve peak gradient measures 7.7 mmHg. Aortic valve area, by VTI measures 3.27 cm. Pulmonic Valve: The pulmonic valve was normal in structure. Pulmonic valve regurgitation is not visualized.  No evidence of pulmonic stenosis. Aorta: The aortic root is normal in size and structure. Venous: The inferior vena cava is normal in size with greater than 50% respiratory variability, suggesting right atrial pressure of 3 mmHg. IAS/Shunts: No atrial level shunt detected by color flow Doppler. Agitated saline contrast was given intravenously to evaluate for intracardiac shunting. Agitated saline contrast bubble study was negative, with no evidence of any interatrial shunt.  LEFT VENTRICLE PLAX 2D LVIDd:         3.80 cm      Diastology LVIDs:         2.60 cm      LV e' medial:    6.09 cm/s LV PW:         0.80 cm      LV E/e' medial:  18.1 LV IVS:        0.90 cm      LV e' lateral:   10.10 cm/s LVOT diam:     2.10 cm      LV E/e' lateral: 10.9 LV SV:         107 LV SV Index:   60 LVOT Area:     3.46 cm  LV Volumes (MOD) LV vol d, MOD A2C: 164.0 ml LV vol d, MOD A4C: 158.0 ml LV vol s, MOD A2C: 78.6 ml LV vol s, MOD A4C: 61.9 ml LV SV MOD A2C:     85.4 ml LV SV MOD A4C:     158.0 ml LV SV MOD BP:      92.1 ml RIGHT VENTRICLE             IVC RV Basal diam:  3.80 cm     IVC diam: 2.00 cm RV S prime:     13.40 cm/s TAPSE (M-mode): 2.0 cm LEFT ATRIUM             Index        RIGHT ATRIUM           Index LA diam:        3.80 cm 2.15 cm/m   RA Area:     19.00 cm LA Vol (A2C):   71.0 ml 40.12 ml/m  RA Volume:   54.60 ml  30.85 ml/m LA Vol (A4C):   85.9 ml 48.54 ml/m LA Biplane Vol: 84.4 ml 47.69 ml/m  AORTIC VALVE                    PULMONIC VALVE AV Area (Vmax):    3.44 cm     PV Vmax:          1.08 m/s AV Area (Vmean):   3.03 cm     PV Peak grad:     4.6 mmHg AV Area (VTI):     3.27 cm     PR End Diast Vel: 1.02 msec AV Vmax:           139.00 cm/s AV Vmean:          84.800 cm/s AV VTI:            0.326 m AV Peak Grad:      7.7 mmHg AV Mean Grad:      3.0 mmHg LVOT Vmax:         138.00 cm/s LVOT Vmean:        74.100 cm/s LVOT VTI:          0.308 m LVOT/AV VTI ratio: 0.94  AORTA Ao Root diam: 3.30 cm Ao Asc diam:   3.30 cm MITRAL VALVE                TRICUSPID VALVE MV Area (PHT): 3.10 cm     TR Peak grad:   26.6 mmHg MV Area VTI:   2.70 cm     TR Vmax:        258.00 cm/s MV Peak grad:  5.3 mmHg MV Mean grad:  2.0 mmHg     SHUNTS MV Vmax:       1.15 m/s     Systemic VTI:  0.31 m MV Vmean:      59.1 cm/s    Systemic Diam: 2.10 cm MV E velocity: 110.00 cm/s MV A velocity: 85.90 cm/s MV E/A ratio:  1.28 Oneil Parchment MD Electronically signed by Oneil Parchment MD Signature Date/Time: 01/27/2024/4:36:03 PM    Final    DG Chest 2 View Result Date: 01/27/2024 CLINICAL DATA:  Weakness. EXAM: CHEST - 2 VIEW COMPARISON:  01/18/2024 FINDINGS: Stable hyperinflation and emphysema. Improved hazy opacity at the right lung base. Blunting of the costophrenic angles may be due to small effusions or hyperinflation. No acute airspace disease. No pneumothorax. No pulmonary edema. Stable heart size and mediastinal contours. Coronary stent again seen. No acute osseous finding. IMPRESSION: 1. Improved hazy opacity at the right lung base. 2. Stable hyperinflation and emphysema. 3. Blunting of the costophrenic angles may be due to small effusions or hyperinflation. Electronically Signed   By: Andrea Gasman M.D.   On: 01/27/2024 12:29   MR BRAIN WO CONTRAST Result Date: 01/27/2024 CLINICAL DATA:  Left-sided weakness and paresthesias EXAM: MRI HEAD WITHOUT CONTRAST TECHNIQUE: Multiplanar, multiecho pulse sequences of the brain and surrounding structures were obtained without intravenous contrast. COMPARISON:  None Available. FINDINGS: MRI brain: The brain volume is normal. The signal in the brain parenchyma is normal. There is no acute or chronic infarct. The ventricles are normal. No mass lesion. There are normal flow signals in the carotid arteries and basilar artery. No significant bone marrow signal abnormality. No significant abnormality in the paranasal sinuses or soft tissues. IMPRESSION: Normal Electronically Signed   By: Nancyann Burns M.D.    On: 01/27/2024 09:52  CT HEAD CODE STROKE WO CONTRAST Result Date: 01/27/2024 EXAM: CT HEAD WITHOUT CONTRAST 01/27/2024 07:52:39 AM TECHNIQUE: CT of the head was performed without the administration of intravenous contrast. Automated exposure control, iterative reconstruction, and/or weight based adjustment of the mA/kV was utilized to reduce the radiation dose to as low as reasonably achievable. COMPARISON: CT of the head dated 08/20/2021. CLINICAL HISTORY: Neuro deficit, acute, stroke suspected. Patient went to bed at 3:30am this morning. Woke up at 6:30am and said the left side of his body has a different sensation. He stated he is having trouble balancing and feels weaker. Feels disoriented, cannot concentrate. FINDINGS: BRAIN AND VENTRICLES: No acute hemorrhage. No evidence of acute infarct. No hydrocephalus. No extra-axial collection. No mass effect or midline shift. [place] Stroke Program Early CT (ASPECT) score: Ganglionic (caudate, IC, Lentiform Nucleus, insula, M1-M3): 7. Supraganglionic (M4-M6): 3. Total: 10. These findings were discussed with Dr. Zammit at 07:58 AM 01/27/2024. ORBITS: No acute abnormality. SINUSES: No acute abnormality. SOFT TISSUES AND SKULL: No acute soft tissue abnormality. No skull fracture. IMPRESSION: 1. No acute intracranial abnormality. 2. ASPECTS score 10. 3. Findings discussed with Dr. Zammit at 07:58 am on 01/27/2024. Electronically signed by: Evalene Coho MD 01/27/2024 08:20 AM EDT RP Workstation: [id number]     Labs:   Basic Metabolic Panel: Recent Labs  Lab 01/27/24 0740 01/27/24 0750 01/28/24 0548  NA 137 139 142  K 3.5 3.4* 3.9  CL 102 99 106  CO2 25  --  28  GLUCOSE 86 90 85  BUN 11 10 14   CREATININE 0.87 1.10 0.82  CALCIUM  9.6  --  9.1   GFR Estimated Creatinine Clearance: 76.7 mL/min (by C-G formula based on SCr of 0.82 mg/dL). Liver Function Tests: Recent Labs  Lab 01/27/24 0740 01/28/24 0548  AST 40 39  ALT 35 38  ALKPHOS 96 95   BILITOT 0.7 0.5  PROT 7.0 6.2*  ALBUMIN 4.2 3.7   No results for input(s): LIPASE, AMYLASE in the last 168 hours. No results for input(s): AMMONIA in the last 168 hours. Coagulation profile Recent Labs  Lab 01/27/24 0740 01/28/24 0548  INR 1.3* 1.2    CBC: Recent Labs  Lab 01/27/24 0740 01/27/24 0750 01/28/24 0548  WBC 6.2  --  5.0  NEUTROABS 2.8  --   --   HGB 13.8 14.3 12.7*  HCT 42.2 42.0 38.7*  MCV 93.6  --  95.8  PLT 199  --  188   Cardiac Enzymes: No results for input(s): CKTOTAL, CKMB, CKMBINDEX, TROPONINI in the last 168 hours. BNP: Invalid input(s): POCBNP CBG: Recent Labs  Lab 01/27/24 0744  GLUCAP 92   D-Dimer No results for input(s): DDIMER in the last 72 hours. Hgb A1c Recent Labs    01/27/24 1513  HGBA1C 5.1   Lipid Profile Recent Labs    01/28/24 0548  CHOL 88  HDL 49  LDLCALC 34  TRIG 28  CHOLHDL 1.8   Thyroid function studies No results for input(s): TSH, T4TOTAL, T3FREE, THYROIDAB in the last 72 hours.  Invalid input(s): FREET3 Anemia work up No results for input(s): VITAMINB12, FOLATE, FERRITIN, TIBC, IRON, RETICCTPCT in the last 72 hours. Microbiology Recent Results (from the past 240 hours)  Resp panel by RT-PCR (RSV, Flu A&B, Covid) Anterior Nasal Swab     Status: None   Collection Time: 01/27/24 11:26 AM   Specimen: Anterior Nasal Swab  Result Value Ref Range Status   SARS Coronavirus 2 by RT PCR NEGATIVE NEGATIVE  Final    Comment: (NOTE) SARS-CoV-2 target nucleic acids are NOT DETECTED.  The SARS-CoV-2 RNA is generally detectable in upper respiratory specimens during the acute phase of infection. The lowest concentration of SARS-CoV-2 viral copies this assay can detect is 138 copies/mL. A negative result does not preclude SARS-Cov-2 infection and should not be used as the sole basis for treatment or other patient management decisions. A negative result may occur with   improper specimen collection/handling, submission of specimen other than nasopharyngeal swab, presence of viral mutation(s) within the areas targeted by this assay, and inadequate number of viral copies(<138 copies/mL). A negative result must be combined with clinical observations, patient history, and epidemiological information. The expected result is Negative.  Fact Sheet for Patients:  BloggerCourse.com  Fact Sheet for Healthcare Providers:  SeriousBroker.it  This test is no t yet approved or cleared by the [place]  FDA and  has been authorized for detection and/or diagnosis of SARS-CoV-2 by FDA under an Emergency Use Authorization (EUA). This EUA will remain  in effect (meaning this test can be used) for the duration of the COVID-19 declaration under Section 564(b)(1) of the Act, 21 U.S.C.section 360bbb-3(b)(1), unless the authorization is terminated  or revoked sooner.       Influenza A by PCR NEGATIVE NEGATIVE Final   Influenza B by PCR NEGATIVE NEGATIVE Final    Comment: (NOTE) The Xpert Xpress SARS-CoV-2/FLU/RSV plus assay is intended as an aid in the diagnosis of influenza from Nasopharyngeal swab specimens and should not be used as a sole basis for treatment. Nasal washings and aspirates are unacceptable for Xpert Xpress SARS-CoV-2/FLU/RSV testing.  Fact Sheet for Patients: BloggerCourse.com  Fact Sheet for Healthcare Providers: SeriousBroker.it  This test is not yet approved or cleared by the [place]  FDA and has been authorized for detection and/or diagnosis of SARS-CoV-2 by FDA under an Emergency Use Authorization (EUA). This EUA will remain in effect (meaning this test can be used) for the duration of the COVID-19 declaration under Section 564(b)(1) of the Act, 21 U.S.C. section 360bbb-3(b)(1), unless the authorization is terminated or revoked.      Resp Syncytial Virus by PCR NEGATIVE NEGATIVE Final    Comment: (NOTE) Fact Sheet for Patients: BloggerCourse.com  Fact Sheet for Healthcare Providers: SeriousBroker.it  This test is not yet approved or cleared by the [place]  FDA and has been authorized for detection and/or diagnosis of SARS-CoV-2 by FDA under an Emergency Use Authorization (EUA). This EUA will remain in effect (meaning this test can be used) for the duration of the COVID-19 declaration under Section 564(b)(1) of the Act, 21 U.S.C. section 360bbb-3(b)(1), unless the authorization is terminated or revoked.  Performed at [facility], [address], [place], [place] [address]      Discharge Instructions:   Discharge Instructions     Diet - low sodium heart healthy   Complete by: As directed    Discharge instructions   Complete by: As directed    Follow-up with your primary care provider in 1 week.  Follow-up with cardiology as scheduled by the clinic.   Increase activity slowly   Complete by: As directed       Allergies as of 01/28/2024       Reactions   Plasticized Base [plastibase] Swelling   Swelling mouth, eye lids, bridge of nose and under eyes. (Plastic bottles, plastic sleeve over thermometer, straws)   Tetracycline Anaphylaxis, Other (See Comments)   Triptans    Contraindicated d/t h/o  MI    Effexor [venlafaxine Hcl] Rash   Prednisone Other (See Comments)   Unable to sleep   Sulfamethoxazole-trimethoprim Nausea Only        Medication List     TAKE these medications    albuterol  108 (90 Base) MCG/ACT inhaler Commonly known as: VENTOLIN  HFA Inhale 2 puffs into the lungs every 6 (six) hours as needed for wheezing or shortness of breath.   ALPRAZolam  0.5 MG tablet Commonly known as: XANAX  Take 0.5 mg by mouth every 6 (six) hours.   Botox  200 units injection Generic drug: botulinum toxin Type A  INJECT 155  UNITS INTRAMUSCULARLY TO HEAD AND NECK EVERY 3 MONTHS  (DISCARD UNUSED AFTER FIRST USE) What changed:  how much to take how to take this when to take this   Breo Ellipta  100-25 MCG/ACT Aepb Generic drug: fluticasone  furoate-vilanterol INHALE 1 PUFF BY MOUTH EVERY DAY What changed: See the new instructions.   Comirnaty  syringe Generic drug: COVID-19 mRNA vaccine (Pfizer) Inject into the muscle.   doxazosin  8 MG tablet Commonly known as: CARDURA  Take 8 mg by mouth daily.   Eliquis  5 MG Tabs tablet Generic drug: apixaban  Take 1 tablet (5 mg total) by mouth 2 (two) times daily.   Emgality  120 MG/ML Soaj Generic drug: Galcanezumab -gnlm INJECT SUBCUTANEOUSLY THE  CONTENTS OF 1 PEN (120 MG)  MONTHLY AS MAINTENANCE DOSE What changed: See the new instructions.   EPINEPHrine 0.3 mg/0.3 mL Soaj injection Commonly known as: EPI-PEN Inject 0.3 mg into the muscle as needed.   escitalopram  10 MG tablet Commonly known as: LEXAPRO  Take 10 mg by mouth in the morning.   gabapentin  400 MG capsule Commonly known as: NEURONTIN  Take 400 mg by mouth 4 (four) times daily.   memantine  10 MG tablet Commonly known as: NAMENDA  TAKE 2 TABLETS BY MOUTH IN THE MORNING AND 1 TABLET IN THE LATE AFTERNOON What changed:  how much to take how to take this when to take this   Nerivio Devi USE WITHIN 1 HR OF MIGRAINE. SET A STRONG YET COMFORTABLE INTENSITY LEVEL & MAINTAIN LEVEL FOR 45 MINS   pantoprazole  40 MG tablet Commonly known as: PROTONIX  Take 40 mg by mouth daily.   Qulipta  60 MG Tabs Generic drug: Atogepant  Take 1 tablet (60 mg total) by mouth daily.   rosuvastatin  20 MG tablet Commonly known as: CRESTOR  Take 1 tablet (20 mg total) by mouth daily.   silodosin  8 MG Caps capsule Commonly known as: RAPAFLO  Take 8 mg by mouth daily with breakfast.   Ubrelvy  100 MG Tabs Generic drug: Ubrogepant  TAKE 1 TABLET MOUTH AS NEEDED AS CLOSE TO MIGRAINE ONSET AS POSSIBLE. MAY REPEAT IN 2  HOURS IF NEEDED. MAX 200 MG PER 24 HOURS.        Follow-up Information     Kyle Emery CROME, MD Follow up in 1 week(s).   Specialty: Internal Medicine Contact information: [address] [place] [address] [phone]                  Time coordinating discharge: 39 minutes  Signed:  Sonali Wivell  [facility] 01/28/2024, 4:24 PM

## 2024-01-28 NOTE — Care Management Obs Status (Signed)
 MEDICARE OBSERVATION STATUS NOTIFICATION   Patient Details  Name: Kyle Walters MRN: [id number] Date of Birth: 02-25-1956   Medicare Observation Status Notification Given:  Yes    Chrysta Fulcher A Lillie Bollig, LCSW 01/28/2024, 3:07 PM

## 2024-01-29 ENCOUNTER — Telehealth: Payer: Self-pay

## 2024-01-29 NOTE — Transitions of Care (Post Inpatient/ED Visit) (Unsigned)
   01/29/2024  Name: Kyle Walters MRN: [id number] DOB: 04/23/1956  Today's TOC FU Call Status: Today's TOC FU Call Status:: Unsuccessful Call (1st Attempt) Unsuccessful Call (1st Attempt) Date: 01/29/24  Attempted to reach the patient regarding the most recent Inpatient/ED visit.  Follow Up Plan: Additional outreach attempts will be made to reach the patient to complete the Transitions of Care (Post Inpatient/ED visit) call.   Signature Julian Lemmings, LPN [facility] Nurse Health Advisor Direct Dial [phone]

## 2024-01-30 NOTE — Transitions of Care (Post Inpatient/ED Visit) (Signed)
   01/30/2024  Name: Kyle Walters MRN: [id number] DOB: 02-Mar-1956  Today's TOC FU Call Status: Today's TOC FU Call Status:: Successful TOC FU Call Completed Unsuccessful Call (1st Attempt) Date: 01/29/24 [facility] FU Call Complete Date: 01/30/24 Patient's Name and Date of Birth confirmed.  Transition Care Management Follow-up Telephone Call Date of Discharge: 01/29/24 Discharge Facility: [facility]) Type of Discharge: Inpatient Admission Primary Inpatient Discharge Diagnosis:: TIA How have you been since you were released from the hospital?: Better Any questions or concerns?: No  Items Reviewed: Did you receive and understand the discharge instructions provided?: Yes Medications obtained,verified, and reconciled?: Yes (Medications Reviewed) Any new allergies since your discharge?: No Dietary orders reviewed?: Yes Do you have support at home?: Yes People in Home [RPT]: spouse  Medications Reviewed Today: Medications Reviewed Today   Medications were not reviewed in this encounter     Home Care and Equipment/Supplies: Were Home Health Services Ordered?: NA Any new equipment or medical supplies ordered?: NA  Functional Questionnaire: Do you need assistance with bathing/showering or dressing?: No Do you need assistance with meal preparation?: No Do you need assistance with eating?: No Do you have difficulty maintaining continence: No Do you need assistance with getting out of bed/getting out of a chair/moving?: No Do you have difficulty managing or taking your medications?: No  Follow up appointments reviewed: PCP Follow-up appointment confirmed?: No (not [facility] pt) MD Provider Line Number:[phone] Given: No Specialist Hospital Follow-up appointment confirmed?: NA Do you need transportation to your follow-up appointment?: No Do you understand care options if your condition(s) worsen?: Yes-patient verbalized understanding Not [facility] pt  Julian Lemmings, LPN [facility] Nurse  Health Advisor Direct Dial [phone]  SIGNATURE

## 2024-01-31 ENCOUNTER — Ambulatory Visit
Admission: RE | Admit: 2024-01-31 | Discharge: 2024-01-31 | Disposition: A | Discharge status: Home / Self Care | Source: Ambulatory Visit | Attending: Internal Medicine | Admitting: Internal Medicine

## 2024-01-31 ENCOUNTER — Ambulatory Visit: Admitting: Radiology

## 2024-01-31 VITALS — BP 107/63 | HR 55 | Temp 98.0°F | Resp 16

## 2024-01-31 DIAGNOSIS — R0789 Other chest pain: Secondary | ICD-10-CM | POA: Diagnosis not present

## 2024-01-31 DIAGNOSIS — I700 Atherosclerosis of aorta: Secondary | ICD-10-CM | POA: Diagnosis not present

## 2024-01-31 DIAGNOSIS — R918 Other nonspecific abnormal finding of lung field: Secondary | ICD-10-CM | POA: Diagnosis not present

## 2024-01-31 NOTE — Discharge Instructions (Addendum)
 Your chest x-ray looks great, no more signs of pneumonia.  I suspect your ribcage pain is due to overexertion/muscular pain. You may apply heat/warmth to the right lower ribcage and upper abdomen and use gentle stretches. Take tylenol  650mg  every 6 hours as needed for aches and pains.   If you develop any new or worsening symptoms or if your symptoms do not start to improve, please return here or follow-up with your primary care provider. If your symptoms are severe, please go to the emergency room.

## 2024-01-31 NOTE — ED Triage Notes (Signed)
 Pt c/o pain in right rib cage for 1 day. Has not had any cough or congestion.  He finished antibiotics (Augmentin and Z pack) for pneumonia in right lung one week ago today.   States he did workout on peddler today.

## 2024-01-31 NOTE — ED Provider Notes (Signed)
 [facility]    CSN: [id number] Arrival date & time: 01/31/24  1724      History   Chief Complaint Chief Complaint  Patient presents with   Back Pain    Pain in right rib cage. Finished antibiotics (Augmentin and Z pack) for pneumonia in right lung one week ago today. May have overexerted during exercise today. May be pain from kyphosis, though that seems unlikely with pain on one side. - Entered by patient    HPI Vertis Scheib is a 68 y.o. male.   Breland Trouten is a 68 y.o. male presenting for chief complaint of right lower posterior ribcage pain that intermittently radiates into the right anterior lower ribcage that started today. Recently treated for right lower lobe pneumonia first diagnosed on 01/18/2024 with Augmentin and azithromycin. Pneumonia presented with chest pain 2 weeks ago without cough or fever/chills. He had another chest x-ray on January 27, 2024 showing improving right lower lob opacity. Radiologist recommended repeat chest x-ray 3-4 weeks after initial antibiotic therapy.  History of bipolar, states he has been in a hypomanic state recently and states he has been more active. 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 States he can do this without shortness of breath or fatigue. Chest pain is a 3/10, worse with movements of the chest/upper right arm at the shoulder joint, and is described as an intermittent stabbing pain. No recent trauma/injuries to the chest or rashes. History of emphysema/copd, he has not needed rescue inhaler/nebulizer.  He has not attempted use of any OTC medications to treat pain prior to arrival.    Back Pain   Past Medical History:  Diagnosis Date   ADHD (attention deficit hyperactivity disorder)    Allergy    Anemia    Anxiety panic attack   Arthritis    COPD (chronic obstructive pulmonary disease) (HCC)    bronchitis/emphysema   Coronary artery disease    Depression    Emphysema lung (HCC)     Emphysema of lung (HCC)    Enlarged prostate    Fibromyalgia    GERD (gastroesophageal reflux disease)    Hyperlipidemia    Hypertension    IBS (irritable bowel syndrome)    Migraine    Myocardial infarction (HCC)    Panic attack     Patient Active Problem List   Diagnosis Date Noted   TIA (transient ischemic attack) 01/27/2024   Diarrhea 01/27/2024   Ischemic cardiomyopathy 01/20/2024   Lumbar compression fracture (HCC) 08/26/2023   Abnormal echocardiogram 03/26/2023   S/P coronary artery stent placement 03/26/2023   Claudication 03/26/2023   OSA (obstructive sleep apnea) 11/14/2022   Daytime sleepiness 10/23/2022   History of tobacco use 09/07/2021   Left ventricular thrombus 04/03/2021   Hyperlipidemia 03/14/2021   Coronary artery disease    Myocardial infarction (HCC) 09/21/2020   Chronic migraine without aura, with intractable migraine, so stated, with status migrainosus 07/14/2020   Severe episode of recurrent major depressive disorder, without psychotic features (HCC) 07/14/2020   Hemicrania continua 10/30/2019   Polyclonal gammopathy determined by serum protein electrophoresis 02/25/2018   Primary osteoarthritis of both feet 12/13/2017   History of BPH 12/13/2017   Family history of psoriasis in mother 12/13/2017   History of gastroesophageal reflux (GERD) 12/13/2017   Anxiety and depression 12/13/2017   COPD (chronic obstructive pulmonary disease) (HCC) 01/29/2013   CHEST PAIN-PRECORDIAL 11/01/2009   Primary osteoarthritis of both hands 10/12/2009   CHEST PAIN  10/12/2009    Past Surgical History:  Procedure Laterality Date   CARDIAC CATHETERIZATION     CORONARY STENT INTERVENTION N/A 03/14/2021   Procedure: CORONARY STENT INTERVENTION;  Surgeon: Dann Candyce RAMAN, MD;  Location: [facility] INVASIVE CV LAB;  Service: Cardiovascular;  Laterality: N/A;   LEFT HEART CATH AND CORONARY ANGIOGRAPHY N/A 03/14/2021   Procedure: LEFT HEART CATH AND CORONARY ANGIOGRAPHY;   Surgeon: Dann Candyce RAMAN, MD;  Location: [facility] INVASIVE CV LAB;  Service: Cardiovascular;  Laterality: N/A;   NO PAST SURGERIES         Home Medications    Prior to Admission medications   Medication Sig Start Date End Date Taking? Authorizing Provider  albuterol  (VENTOLIN  HFA) 108 (90 Base) MCG/ACT inhaler Inhale 2 puffs into the lungs every 6 (six) hours as needed for wheezing or shortness of breath. 10/23/22   Parrett, Madelin RAMAN, NP  ALPRAZolam  (XANAX ) 0.5 MG tablet Take 0.5 mg by mouth every 6 (six) hours. 06/04/23   [provider]  apixaban  (ELIQUIS ) 5 MG TABS tablet Take 1 tablet (5 mg total) by mouth 2 (two) times daily. 01/20/24   Patwardhan, Newman PARAS, MD  Atogepant  (QULIPTA ) 60 MG TABS Take 1 tablet (60 mg total) by mouth daily. 10/02/23   Ines Onetha NOVAK, MD  botulinum toxin Type A  (BOTOX ) 200 units injection INJECT 155 UNITS INTRAMUSCULARLY TO HEAD AND NECK EVERY 3 MONTHS  (DISCARD UNUSED AFTER FIRST USE) Patient taking differently: Inject 155 Units into the muscle every 3 (three) months. INJECT 155 UNITS INTRAMUSCULARLY TO HEAD AND NECK EVERY 3 MONTHS  (DISCARD UNUSED AFTER FIRST USE) 06/24/23   Ines Onetha NOVAK, MD  BREO ELLIPTA  100-25 MCG/ACT AEPB INHALE 1 PUFF BY MOUTH EVERY DAY Patient taking differently: Inhale 1 puff into the lungs daily. 11/11/23   Shelah Lamar RAMAN, MD  COVID-19 mRNA vaccine, Pfizer, (COMIRNATY ) syringe Inject into the muscle. 07/15/23     doxazosin  (CARDURA ) 8 MG tablet Take 8 mg by mouth daily.    [provider]  EMGALITY  120 MG/ML SOAJ INJECT SUBCUTANEOUSLY THE  CONTENTS OF 1 PEN (120 MG)  MONTHLY AS MAINTENANCE DOSE Patient taking differently: Inject 120 mg into the skin every 30 (thirty) days. 02/06/23   Ines Onetha NOVAK, MD  EPINEPHrine 0.3 mg/0.3 mL IJ SOAJ injection Inject 0.3 mg into the muscle as needed. 10/07/22   [provider]  escitalopram  (LEXAPRO ) 10 MG tablet Take 10 mg by mouth in the morning. 10/26/14   [provider]  gabapentin  (NEURONTIN ) 400 MG capsule Take 400 mg by mouth 4 (four) times daily.     [provider]  memantine  (NAMENDA ) 10 MG tablet TAKE 2 TABLETS BY MOUTH IN THE MORNING AND 1 TABLET IN THE LATE AFTERNOON Patient taking differently: Take 20 mg by mouth See admin instructions. TAKE 2 TABLETS BY MOUTH IN THE MORNING AND 1 TABLET IN THE LATE AFTERNOON 11/06/22   Ines Onetha NOVAK, MD  Nerve Stimulator (NERIVIO) DEVI USE WITHIN 1 HR OF MIGRAINE. SET A STRONG YET COMFORTABLE INTENSITY LEVEL & MAINTAIN LEVEL FOR 45 MINS 01/23/23   Ines Onetha NOVAK, MD  pantoprazole  (PROTONIX ) 40 MG tablet Take 40 mg by mouth daily. 12/16/23   [provider]  rosuvastatin  (CRESTOR ) 20 MG tablet Take 1 tablet (20 mg total) by mouth daily. 11/04/23   Patwardhan, Newman PARAS, MD  silodosin  (RAPAFLO ) 8 MG CAPS capsule Take 8 mg by mouth daily with breakfast.    [provider]  Ubrogepant  (UBRELVY ) 100 MG TABS TAKE 1 TABLET MOUTH AS NEEDED AS CLOSE TO MIGRAINE ONSET AS POSSIBLE. MAY REPEAT IN 2 HOURS IF NEEDED. MAX 200 MG PER 24 HOURS. 10/02/23   Ines Onetha NOVAK, MD    Family History Family History  Problem Relation Age of Onset   Heart disease Mother    Breast cancer Mother    Diabetes Mother    Osteoarthritis Mother    Stroke Mother    Other Father        stomach ulcer   Heart disease Father    Bladder Cancer Father    Diabetes Father    Stroke Father    Hypertension Father    Pancreatic cancer Father    Migraines Father    Irritable bowel syndrome Father    Hypertension Brother    Other Brother        stomach ulcer   Colon cancer Neg Hx    Rectal cancer Neg Hx    Esophageal cancer Neg Hx    Stomach cancer Neg Hx     Social History Social History   Tobacco Use   Smoking status: Former    Current packs/day: 0.00    Average packs/day: 1.5 packs/day for 40.0 years (60.0 ttl pk-yrs)    Types: Cigarettes    Start date: 05/17/1973    Quit date: 05/17/2013     Years since quitting: 10.7   Smokeless tobacco: Never  Vaping Use   Vaping status: Former  Substance Use Topics   Alcohol use: No    Alcohol/week: 0.0 standard drinks of alcohol   Drug use: No     Allergies   Plasticized base [plastibase], Tetracycline, Triptans, Effexor [venlafaxine hcl], Prednisone, and Sulfamethoxazole-trimethoprim   Review of Systems Review of Systems  Musculoskeletal:  Positive for back pain.  Per HPI  Physical Exam Triage Vital Signs ED Triage Vitals  Encounter Vitals Group     BP 01/31/24 1752 107/63     Girls Systolic BP Percentile --      Girls Diastolic BP Percentile --      Boys Systolic BP Percentile --      Boys Diastolic BP Percentile --      Pulse Rate 01/31/24 1752 (!) 55     Resp 01/31/24 1752 16     Temp 01/31/24 1752 98 F (36.7 C)     Temp Source 01/31/24 1752 Oral     SpO2 01/31/24 1752 95 %     Weight --      Height --      Head Circumference --      Peak Flow --      Pain Score 01/31/24 1754 3     Pain Loc --      Pain Education --      Exclude from Growth Chart --    No data found.  Updated Vital Signs BP 107/63 (BP Location: Right Arm)   Pulse (!) 55   Temp 98 F (36.7 C) (Oral)   Resp 16   SpO2 95%   Visual Acuity Right Eye Distance:   Left Eye Distance:   Bilateral Distance:    Right Eye Near:   Left Eye Near:    Bilateral Near:     Physical Exam Vitals and nursing note reviewed.  Constitutional:      Appearance: He is not ill-appearing or toxic-appearing.  HENT:     Head: Normocephalic and atraumatic.     Right Ear: Hearing and external ear  normal.     Left Ear: Hearing and external ear normal.     Nose: Nose normal.     Mouth/Throat:     Lips: Pink.  Eyes:     General: Lids are normal. Vision grossly intact. Gaze aligned appropriately.     Extraocular Movements: Extraocular movements intact.     Conjunctiva/sclera: Conjunctivae normal.  Pulmonary:     Effort: Pulmonary effort is normal.      Breath sounds: Normal breath sounds. No decreased breath sounds, wheezing, rhonchi or rales.  Abdominal:   Musculoskeletal:     Cervical back: Neck supple.       Back:  Skin:    General: Skin is warm and dry.     Capillary Refill: Capillary refill takes less than 2 seconds.     Findings: No rash.  Neurological:     General: No focal deficit present.     Mental Status: He is alert and oriented to person, place, and time. Mental status is at baseline.     Cranial Nerves: No dysarthria or facial asymmetry.  Psychiatric:        Mood and Affect: Mood normal.        Speech: Speech normal.        Behavior: Behavior normal.        Thought Content: Thought content normal.        Judgment: Judgment normal.      UC Treatments / Results  Labs (all labs ordered are listed, but only abnormal results are displayed) Labs Reviewed - No data to display  EKG   Radiology DG Chest 2 View Result Date: 01/31/2024 CLINICAL DATA:  Right lower ribcage pain. Right lower lobe pneumonia, treated with Augmentin and azithromycin. EXAM: CHEST - 2 VIEW COMPARISON:  01/27/2024 FINDINGS: Heart size and pulmonary vascularity are normal. Emphysematous changes in the lungs. No significant residual airspace disease or consolidation. No pleural effusion or pneumothorax. Mediastinal contours appear intact. Calcification of the aorta. Degenerative changes in the spine. IMPRESSION: Emphysematous changes in the lungs.  No focal consolidation. Electronically Signed   By: Elsie Gravely M.D.   On: 01/31/2024 19:10    Procedures Procedures (including critical care time)  Medications Ordered in UC Medications - No data to display  Initial Impression / Assessment and Plan / UC Course  I have reviewed the triage vital signs and the nursing notes.  Pertinent labs & imaging results that were available during my care of the patient were reviewed by me and considered in my medical decision making (see chart for  details).   1. Rib pain on right side Chest x-ray shows resolved right lower lobe pneumonia without acute bony abnormality.  Suspect right rib pain is due to muscle spasm/musculoskeletal pain. Tylenol  PRN for pain. Warm compresses/stretches.  Pain is reproducible on palpation, low suspicion for ACS, therefore we did not perform EKG today.   Counseled patient on potential for adverse effects with medications prescribed/recommended today, strict ER and return-to-clinic precautions discussed, patient verbalized understanding.    Final Clinical Impressions(s) / UC Diagnoses   Final diagnoses:  Rib pain on right side     Discharge Instructions      Your chest x-ray looks great, no more signs of pneumonia.  I suspect your ribcage pain is due to overexertion/muscular pain. You may apply heat/warmth to the right lower ribcage and upper abdomen and use gentle stretches. Take tylenol  650mg  every 6 hours as needed for aches and pains.   If you  develop any new or worsening symptoms or if your symptoms do not start to improve, please return here or follow-up with your primary care provider. If your symptoms are severe, please go to the emergency room.     ED Prescriptions   None    PDMP not reviewed this encounter.   Enedelia Dorna HERO, [place] 01/31/24 1945

## 2024-02-05 ENCOUNTER — Telehealth: Payer: Self-pay

## 2024-02-05 NOTE — Addendum Note (Signed)
 Addended by: SHONA SAVANT A on: 02/05/2024 04:38 PM   Modules accepted: Orders

## 2024-02-05 NOTE — Telephone Encounter (Signed)
   Pre-operative Risk Assessment    Patient Name: Kyle Walters  DOB: [birth year] MRN: [id number]   Date of last office visit: 01/20/24 [facility], MD Date of next office visit: NONE  Request for Surgical Clearance    Procedure:  Dental Extraction - Amount of Teeth to be Pulled:  1 UPPER MOLAR  Date of Surgery:  Clearance TBD                                Surgeon:  NOT INDICATED Surgeon's Group or Practice Name:  [facility] Phone number:  [phone] Fax number:  [phone]   Type of Clearance Requested:   - Medical  - Pharmacy:  Hold Apixaban  (Eliquis )     Type of Anesthesia:  Not Indicated (PER REQUEST PT REQUESTING ANESTHESIA)   Additional requests/questions:    Signed, Lucie DELENA Ku   02/05/2024, 9:53 AM

## 2024-02-05 NOTE — Telephone Encounter (Signed)
   Patient Name: Kyle Walters  DOB: Dec 27, 1955 MRN: [id number]  Primary Cardiologist: Newman JINNY Kelsey Durflinger, MD  Chart reviewed as part of pre-operative protocol coverage.    Simple dental extractions (i.e. 1-2 teeth) are considered low risk procedures per guidelines and generally do not require any specific cardiac clearance. It is also generally accepted that for simple extractions and dental cleanings, there is no need to interrupt blood thinner therapy.   SBE prophylaxis is not required for the patient from a cardiac standpoint.  I will route this recommendation to the requesting party via Epic fax function and remove from pre-op pool.  Please call with questions.  Lamarr Satterfield, NP 02/05/2024, 10:06 AM

## 2024-02-06 MED ORDER — EMGALITY 120 MG/ML ~~LOC~~ SOAJ: SUBCUTANEOUS | 3 refills | Status: AC

## 2024-02-07 NOTE — Telephone Encounter (Signed)
 Incoming fax from surgical office indicating that the extractions are surgical, not simple.

## 2024-02-10 ENCOUNTER — Other Ambulatory Visit (HOSPITAL_COMMUNITY): Payer: Self-pay

## 2024-02-10 MED ORDER — FLUZONE HIGH-DOSE 0.5 ML IM SUSY
0.5000 mL | PREFILLED_SYRINGE | Freq: Once | INTRAMUSCULAR | 0 refills | Status: AC
  Filled 2024-02-10: qty 0.5, 1d supply, fill #0

## 2024-02-11 NOTE — Telephone Encounter (Signed)
 Dental office calling requesting a clearance be done. They understand the previous message, but will be sedating the pt and want a clearance. Please advise.

## 2024-02-12 NOTE — Telephone Encounter (Signed)
 Dr. Elmira  You saw this patient on 01/20/2024. Per protocol we request that you comment on his cardiac risk to proceed with surgical extraction of 1 upper molar since it has been less than 2 months since evaluated in the office. Please send your comment to P CV Pre-Op Pool.  Thank you, Lamarr Satterfield DNP, ANP, AACC.

## 2024-02-13 DIAGNOSIS — M5430 Sciatica, unspecified side: Secondary | ICD-10-CM | POA: Diagnosis not present

## 2024-02-13 NOTE — Telephone Encounter (Signed)
   Patient Name: Kyle Walters  DOB: 18-May-1955 MRN: [id number]  Primary Cardiologist: Newman JINNY Lawrence, MD  Chart reviewed as part of pre-operative protocol coverage. Per Dr. Lawrence, primary cardiologist, Low risk. Okay to proceed. Okay to hold Eliquis  two days before procedure and resume after the procedure.    Thanks MJP  Please resume Eliquis  as soon as possible postprocedure, at the discretion of the surgeon.   SBE prophylaxis is not required for the patient from a cardiac standpoint.   I will route this recommendation to the requesting party via Epic fax function and remove from pre-op pool.  Please call with questions.  Damien JAYSON Braver, NP 02/13/2024, 3:40 PM

## 2024-02-19 ENCOUNTER — Other Ambulatory Visit (HOSPITAL_COMMUNITY): Payer: Self-pay

## 2024-03-10 ENCOUNTER — Encounter: Payer: Self-pay | Admitting: Gastroenterology

## 2024-03-10 ENCOUNTER — Telehealth: Payer: Self-pay

## 2024-03-10 ENCOUNTER — Ambulatory Visit: Admitting: Gastroenterology

## 2024-03-10 VITALS — BP 98/60 | HR 58 | Ht 66.0 in | Wt 152.5 lb

## 2024-03-10 DIAGNOSIS — K219 Gastro-esophageal reflux disease without esophagitis: Secondary | ICD-10-CM

## 2024-03-10 DIAGNOSIS — R1319 Other dysphagia: Secondary | ICD-10-CM | POA: Diagnosis not present

## 2024-03-10 NOTE — Patient Instructions (Addendum)
 You have been scheduled for a Barium Esophogram at [facility] (1st floor of the hospital) on 03/30/24 at 9 am. Please arrive 30 minutes prior to your appointment for registration. Make certain not to have anything to eat or drink 3 hours prior to your test. If you need to reschedule for any reason, please contact radiology at [phone] to do so. __________________________________________________________________ A barium swallow is an examination that concentrates on views of the esophagus. This tends to be a double contrast exam (barium and two liquids which, when combined, create a gas to distend the wall of the oesophagus) or single contrast (non-ionic iodine based). The study is usually tailored to your symptoms so a good history is essential. Attention is paid during the study to the form, structure and configuration of the esophagus, looking for functional disorders (such as aspiration, dysphagia, achalasia, motility and reflux) EXAMINATION You may be asked to change into a gown, depending on the type of swallow being performed. A radiologist and radiographer will perform the procedure. The radiologist will advise you of the type of contrast selected for your procedure and direct you during the exam. You will be asked to stand, sit or lie in several different positions and to hold a small amount of fluid in your mouth before being asked to swallow while the imaging is performed .In some instances you may be asked to swallow barium coated marshmallows to assess the motility of a solid food bolus. The exam can be recorded as a digital or video fluoroscopy procedure. POST PROCEDURE It will take 1-2 days for the barium to pass through your system. To facilitate this, it is important, unless otherwise directed, to increase your fluids for the next 24-48hrs and to resume your normal diet.  This test typically takes about 30 minutes to  perform. __________________________________________________________________________________  Kyle Walters have been scheduled for an endoscopy. Please follow written instructions given to you at your visit today.  If you use inhalers (even only as needed), please bring them with you on the day of your procedure.  If you take any of the following medications, they will need to be adjusted prior to your procedure:   DO NOT TAKE 7 DAYS PRIOR TO TEST- Trulicity (dulaglutide) Ozempic, Wegovy (semaglutide) Mounjaro, Zepbound (tirzepatide) Bydureon Bcise (exanatide extended release)  DO NOT TAKE 1 DAY PRIOR TO YOUR TEST Rybelsus (semaglutide) Adlyxin (lixisenatide) Victoza (liraglutide) Byetta (exanatide) ___________________________________________________________________________    Thank you for trusting me with your gastrointestinal care!    Dr. Victory Legrand [name]

## 2024-03-10 NOTE — Telephone Encounter (Signed)
 [facility] Pre-operative Risk Assessment     Request for surgical clearance:     Endoscopy Procedure  What type of surgery is being performed?     EGD  When is this surgery scheduled?     05/11/24  What type of clearance is required ?   Pharmacy  Are there any medications that need to be held prior to surgery and how long? Eliquis  2 days  Practice name and name of physician performing surgery?      [facility]  What is your office phone and fax number?      Phone- [phone]  Fax- [phone]  Anesthesia type (None, local, MAC, general) ?       MAC   Please route your response to Corean [place], [facility]

## 2024-03-10 NOTE — Progress Notes (Signed)
 [facility] Progress Note  Chief Complaint: Dysphagia  Subjective  Prior history Former patient of Dr. Teressa Colonoscopy and EGD by Dr. Legrand December 2023 Screening colonoscopy with grade 3 internal hemorrhoids (symptomatic), diminutive tubular adenoma.  Hemorrhoids appeared too large for banding EGD for GERD only notable for fundic gland polyps.   De-escalation of acid suppression therapy then recommended, and patient was doing well on H2 blocker at last office appointment March 2024.  Described alternating constipation and diarrhea at that time Had been referred to Dr. Merrilyn of [facility] colorectal surgery for consideration of hemorrhoidectomy, but patient felt unable to do that because he could not be off his Plavix  for 4 to 6 weeks afterward (and he was told by that provider there was risk of bleeding 4 weeks after the surgery)  ED for chest pain late September 2025-ruled out for ACS.  Patient reported having had emotional shock precipitating the chest pain that day (according to provider note)  Discussed the use of AI scribe software for clinical note transcription with the patient, who gave verbal consent to proceed. Hospital admission early October 2025 for TIA  History of Present Illness Kyle Walters is a 68 year old male with GERD who presents with swallowing issues.  Dysphagia and esophageal symptoms - Swallowing difficulties since summer, with pills and food getting stuck in the upper esophagus near the sternal notch - Worst dysphagia occurred during the summer, with occasional regurgitation of pills - Recent episode of food impaction over the weekend, first such incident in weeks - Symptoms have been improving but persist intermittently  Flavors indicates that this problem developed several months ago and seem to coincide with him having a recurrence and worsening of his PTSD.  Gastroesophageal reflux disease (gerd) and associated symptoms - History of GERD,  exacerbated by increased stress and PTSD-related flashbacks during the summer - Restarted Protonix  40 mg due to increased stomach pain and GERD symptoms, including heartburn and regurgitation - Improvement in GERD symptoms as flashbacks have decreased  Altered bowel habits and hemorrhoids - History of Abilify use as a mood stabilizer, suspected to contribute to thinning stools - Uses psyllium for management of altered bowel habits - History of sizable hemorrhoids  The dysphagia is generally manifest as food or pills feeling stuck in the throat/sternal notch area, and he may have to bring something back up.  Appetite has been good and weight stable.  We reviewed findings from his previous endoscopic procedures.  He also feels that he is intermittently having episodes of heartburn or regurgitation.  He also reports having had some generalized stomach aches with bloating and feeling of just being generally unwell.  This also apparently came on over the summer and has slowly subsided with some improvements in his mental health.  ROS: Cardiovascular:  no chest pain Respiratory: no dyspnea Anxiety and PTSD Musculoskeletal pain-fibromyalgia Remainder of systems negative except as above  The patient's Past Medical, Family and Social History were reviewed and are on file in the EMR. Past Medical History:  Diagnosis Date   ADHD (attention deficit hyperactivity disorder)    Allergy    Anemia    Anxiety panic attack   Arthritis    COPD (chronic obstructive pulmonary disease) (HCC)    bronchitis/emphysema   Coronary artery disease    Depression    Emphysema lung (HCC)    Emphysema of lung (HCC)    Enlarged prostate    Fibromyalgia    GERD (gastroesophageal reflux disease)  Hyperlipidemia    Hypertension    IBS (irritable bowel syndrome)    Migraine    Myocardial infarction (HCC)    Panic attack    From most recent discharge summary during hospitalization for TIA Was put on TIA  protocol..  Neurological symptoms resolved.  2D echocardiogram showed normal LV thrombus with EF of 55 to 60% with grade 1 diastolic dysfunction.  Negative for interatrial shunt.  Carotid duplex without hemodynamically significant stenosis.  Hemoglobin A1c of 5.1.  MRI of the brain was negative for acute findings.  Discussed with on-call neurology Dr. Matthews.  Recommended CT angiogram of the head and neck which was negative for large vessel occlusion.  Patient is already on Eliquis  so no further antiplatelets were recommended.    (Most like typo because there was NO LV thrombus-see full report below))   Cardiology office note from 01/20/2024 reviewed  Past Surgical History:  Procedure Laterality Date   CARDIAC CATHETERIZATION     CORONARY STENT INTERVENTION N/A 03/14/2021   Procedure: CORONARY STENT INTERVENTION;  Surgeon: Dann Candyce RAMAN, MD;  Location: [facility] INVASIVE CV LAB;  Service: Cardiovascular;  Laterality: N/A;   LEFT HEART CATH AND CORONARY ANGIOGRAPHY N/A 03/14/2021   Procedure: LEFT HEART CATH AND CORONARY ANGIOGRAPHY;  Surgeon: Dann Candyce RAMAN, MD;  Location: [facility] INVASIVE CV LAB;  Service: Cardiovascular;  Laterality: N/A;   NO PAST SURGERIES       Objective:  Med list reviewed  Current Outpatient Medications:    albuterol  (VENTOLIN  HFA) 108 (90 Base) MCG/ACT inhaler, Inhale 2 puffs into the lungs every 6 (six) hours as needed for wheezing or shortness of breath., Disp: 8 g, Rfl: 6   ALPRAZolam  (XANAX ) 0.5 MG tablet, Take 0.5 mg by mouth every 6 (six) hours., Disp: , Rfl:    apixaban  (ELIQUIS ) 5 MG TABS tablet, Take 1 tablet (5 mg total) by mouth 2 (two) times daily., Disp: 180 tablet, Rfl: 3   ARIPiprazole (ABILIFY) 2 MG tablet, , Disp: , Rfl:    Atogepant  (QULIPTA ) 60 MG TABS, Take 1 tablet (60 mg total) by mouth daily., Disp: 30 tablet, Rfl: 5   botulinum toxin Type A  (BOTOX ) 200 units injection, INJECT 155 UNITS INTRAMUSCULARLY TO HEAD AND NECK EVERY 3 MONTHS   (DISCARD UNUSED AFTER FIRST USE) (Patient taking differently: Inject 155 Units into the muscle every 3 (three) months. INJECT 155 UNITS INTRAMUSCULARLY TO HEAD AND NECK EVERY 3 MONTHS  (DISCARD UNUSED AFTER FIRST USE)), Disp: 1 each, Rfl: 3   BREO ELLIPTA  100-25 MCG/ACT AEPB, INHALE 1 PUFF BY MOUTH EVERY DAY (Patient taking differently: Inhale 1 puff into the lungs daily.), Disp: 60 each, Rfl: 5   COVID-19 mRNA vaccine, Pfizer, (COMIRNATY ) syringe, Inject into the muscle., Disp: 0.3 mL, Rfl: 0   doxazosin  (CARDURA ) 8 MG tablet, Take 8 mg by mouth daily., Disp: , Rfl:    EPINEPHrine 0.3 mg/0.3 mL IJ SOAJ injection, Inject 0.3 mg into the muscle as needed., Disp: , Rfl:    escitalopram  (LEXAPRO ) 10 MG tablet, Take 10 mg by mouth in the morning., Disp: , Rfl: 2   gabapentin  (NEURONTIN ) 400 MG capsule, Take 400 mg by mouth 4 (four) times daily. , Disp: , Rfl:    Galcanezumab -gnlm (EMGALITY ) 120 MG/ML SOAJ, INJECT SUBCUTANEOUSLY THE  CONTENTS OF 1 PEN (120 MG)  MONTHLY AS MAINTENANCE DOSE, Disp: 3 mL, Rfl: 3   memantine  (NAMENDA ) 10 MG tablet, TAKE 2 TABLETS BY MOUTH IN THE MORNING AND 1 TABLET IN THE  LATE AFTERNOON (Patient taking differently: Take 20 mg by mouth See admin instructions. TAKE 2 TABLETS BY MOUTH IN THE MORNING AND 1 TABLET IN THE LATE AFTERNOON), Disp: 90 tablet, Rfl: 0   Nerve Stimulator (NERIVIO) DEVI, USE WITHIN 1 HR OF MIGRAINE. SET A STRONG YET COMFORTABLE INTENSITY LEVEL & MAINTAIN LEVEL FOR 45 MINS, Disp: 1 each, Rfl: 11   pantoprazole  (PROTONIX ) 40 MG tablet, Take 40 mg by mouth daily., Disp: , Rfl:    rosuvastatin  (CRESTOR ) 20 MG tablet, Take 1 tablet (20 mg total) by mouth daily., Disp: 90 tablet, Rfl: 2   silodosin  (RAPAFLO ) 8 MG CAPS capsule, Take 8 mg by mouth daily with breakfast., Disp: , Rfl:    sucralfate  (CARAFATE ) 1 g tablet, Take 1 g by mouth 4 (four) times daily., Disp: , Rfl:    Ubrogepant  (UBRELVY ) 100 MG TABS, TAKE 1 TABLET MOUTH AS NEEDED AS CLOSE TO MIGRAINE ONSET  AS POSSIBLE. MAY REPEAT IN 2 HOURS IF NEEDED. MAX 200 MG PER 24 HOURS., Disp: 16 tablet, Rfl: 5   Vital signs in last 24 hrs: Vitals:   03/10/24 1403  BP: 98/60  Pulse: (!) 58   Wt Readings from Last 3 Encounters:  03/10/24 152 lb 8 oz (69.2 kg)  01/27/24 150 lb (68 kg)  01/20/24 157 lb (71.2 kg)    Note recent weight decrease  Physical Exam  Pleasant as always, well-appearing HEENT: sclera anicteric, oral mucosa moist without lesions Neck: supple, no thyromegaly, JVD or lymphadenopathy Cardiac: Regular without appreciable murmur,  no peripheral edema Pulm: clear to auscultation bilaterally, normal RR and effort noted Abdomen: soft, no tenderness, with active bowel sounds. No guarding or palpable hepatosplenomegaly. Skin; warm and dry, no jaundice or rash   Labs:     Latest Ref Rng & Units 01/28/2024    5:48 AM 01/27/2024    7:50 AM 01/27/2024    7:40 AM  CBC  WBC 4.0 - 10.5 K/uL 5.0   6.2   Hemoglobin 13.0 - 17.0 g/dL 87.2  85.6  86.1   Hematocrit 39.0 - 52.0 % 38.7  42.0  42.2   Platelets 150 - 400 K/uL 188   199       Latest Ref Rng & Units 01/28/2024    5:48 AM 01/27/2024    7:50 AM 01/27/2024    7:40 AM  CMP  Glucose 70 - 99 mg/dL 85  90  86   BUN 8 - 23 mg/dL 14  10  11    Creatinine 0.61 - 1.24 mg/dL 9.17  8.89  9.12   Sodium 135 - 145 mmol/L 142  139  137   Potassium 3.5 - 5.1 mmol/L 3.9  3.4  3.5   Chloride 98 - 111 mmol/L 106  99  102   CO2 22 - 32 mmol/L 28   25   Calcium  8.9 - 10.3 mg/dL 9.1   9.6   Total Protein 6.5 - 8.1 g/dL 6.2   7.0   Total Bilirubin 0.0 - 1.2 mg/dL 0.5   0.7   Alkaline Phos 38 - 126 U/L 95   96   AST 15 - 41 U/L 39   40   ALT 0 - 44 U/L 38   35    Normal Lipase Aug 2025 ___________________________________________ Radiologic studies:   ____________________________________________ Other:  Echocardiogram 01/27/2024 (inpatient) 1. No apical thrombus with Definity  contrast. Left ventricular ejection  fraction, by estimation,  is 55 to 60%. The left ventricle has normal  function. The left ventricle demonstrates regional wall motion  abnormalities (see scoring diagram/findings for  description). Left ventricular diastolic parameters are consistent with  Grade I diastolic dysfunction (impaired relaxation).   2. Right ventricular systolic function is normal. The right ventricular  size is normal. There is normal pulmonary artery systolic pressure. The  estimated right ventricular systolic pressure is 29.6 mmHg.   3. Left atrial size was moderately dilated.   4. The mitral valve is normal in structure. No evidence of mitral valve  regurgitation. No evidence of mitral stenosis.   5. The aortic valve is tricuspid. Aortic valve regurgitation is not  visualized. No aortic stenosis is present.   6. The inferior vena cava is normal in size with greater than 50%  respiratory variability, suggesting right atrial pressure of 3 mmHg.   7. Agitated saline contrast bubble study was negative, with no evidence  of any interatrial shunt.   Conclusion(s)/Recommendation(s): No intracardiac source of embolism  detected on this transthoracic study. Consider a transesophageal  echocardiogram to exclude cardiac source of embolism if clinically  indicated.  _____________________________________________   Encounter Diagnoses  Name Primary?   Esophageal dysphagia Yes   Gastroesophageal reflux disease without esophagitis     Assessment and Plan Assessment & Plan Dysphagia Intermittent dysphagia with sensation of obstruction in esophagus. Differential includes stricture or motility disorder. Previous endoscopy normal. Recent TIA-like event complicates immediate endoscopy.  Needs to be put off until 3 months from the event per anesthesia guidelines - Ordered barium swallow study to assess esophageal function and also be sure there is no urgent finding. - Schedule upper endoscopy for early to mid-January post-TIA-like event. -  Coordinate with cardiologist regarding Eliquis  management prior to endoscopy.  Gastroesophageal reflux disease (GERD) Increased GERD symptoms managed with Protonix . Symptoms may be stress-related. - Continue Protonix  40 mg. Antireflux diet and lifestyle measures  Epigastric pain Intermittent pain possibly stress-related, improving with decreased PTSD symptoms. - Monitor symptoms and manage stress-related factors.  Constipation and hemorrhoids Thinning stools possibly due to Abilify. Constipation managed with psyllium. Hemorrhoids present. - Continue psyllium. - Monitor bowel habits and adjust treatment as needed.  He was agreeable to an upper endoscopy in 2 months after discussion of procedure and risks.  The benefits and risks of the planned procedure(s) were described in detail with the patient or (when appropriate) their health care proxy.  Risks were outlined as including, but not limited to, bleeding, infection, perforation, adverse medication reaction leading to cardiac or pulmonary decompensation, pancreatitis (if ERCP).  The limitation of incomplete mucosal visualization was also discussed.  No guarantees or warranties were given.  Patient at increased risk for cardiopulmonary complications of procedure due to medical comorbidities. He is aware of the small but real risk of cardiac event or stroke during the brief hold of oral anticoagulation.   Victory LITTIE Brand III

## 2024-03-12 ENCOUNTER — Ambulatory Visit: Admitting: Emergency Medicine

## 2024-03-12 ENCOUNTER — Encounter: Payer: Self-pay | Admitting: Emergency Medicine

## 2024-03-12 VITALS — BP 124/60 | HR 58 | Temp 97.8°F | Ht 66.0 in | Wt 151.6 lb

## 2024-03-12 DIAGNOSIS — J449 Chronic obstructive pulmonary disease, unspecified: Secondary | ICD-10-CM

## 2024-03-12 DIAGNOSIS — Z87891 Personal history of nicotine dependence: Secondary | ICD-10-CM

## 2024-03-12 MED ORDER — ALBUTEROL SULFATE HFA 108 (90 BASE) MCG/ACT IN AERS: 2.0000 | INHALATION_SPRAY | Freq: Four times a day (QID) | RESPIRATORY_TRACT | 4 refills | Status: DC | PRN

## 2024-03-12 NOTE — Assessment & Plan Note (Signed)
 You will be due for your repeat screening CT scan of the chest in June 2026. Following our office in June after your CT so we can review the results together.  Please call sooner if you have any problems.

## 2024-03-12 NOTE — Progress Notes (Signed)
  Subjective:    Patient ID: Kyle Walters, male    DOB: 06-19-1955, 22   HPI  ROV 03/12/2024 --68 year old gentleman with asthmatic COPD and some superimposed restrictive lung disease.  He also has small calcified granulomas on CT chest.  He unfortunately had a fall and a compression fracture in April 2025. He has also had a TIA since I last saw him. He was treated for a possible RLL CAP in September when he had chest discomfort. No no cough, fever, etc.  He has been managed on Breo, has albuterol  that he never needs. His breathing has been doing ok, minimal cough.   Lung cancer screening CT scan of the chest done 09/27/2023 reviewed by me showed some centrilobular paraseptal emphysema, scattered calcified granulomas, no suspicious nodules.  This was a lung RADS 1 study.     Objective:   Physical Exam Vitals:   03/12/24 1516  BP: 124/60  Pulse: (!) 58  Temp: 97.8 F (36.6 C)  SpO2: 98%  Weight: 151 lb 9.6 oz (68.8 kg)  Height: 5' 6 (1.676 m)    Gen: Pleasant, well-nourished, in no distress,  normal affect  ENT: No lesions,  mouth clear,  oropharynx clear, no postnasal drip  Neck: No JVD, no stridor  Lungs: No use of accessory muscles, clear without rales or rhonchi  Cardiovascular: RRR, heart sounds normal, no murmur or gallops, no peripheral edema  Musculoskeletal: No deformities, no cyanosis or clubbing  Neuro: awake, appropriate, non-focal  Skin: Warm, no lesions or rashes     Assessment & Plan:  COPD (chronic obstructive pulmonary disease) (HCC) We will continue your Breo once daily.  Rinse and gargle after using. Keep your albuterol  available to use 2 puffs to be needed for shortness of breath, chest tightness, wheezing. Your flu shot and COVID-19 vaccine are up-to-date  History of tobacco use You will be due for your repeat screening CT scan of the chest in June 2026. Following our office in June after your CT so we can review the results together.  Please call  sooner if you have any problems.      Lamar Chris, MD, PhD [facility] [phone] or if no answer [phone]

## 2024-03-12 NOTE — Assessment & Plan Note (Signed)
 We will continue your Breo once daily.  Rinse and gargle after using. Keep your albuterol  available to use 2 puffs to be needed for shortness of breath, chest tightness, wheezing. Your flu shot and COVID-19 vaccine are up-to-date

## 2024-03-12 NOTE — Patient Instructions (Signed)
 We will continue your Breo once daily.  Rinse and gargle after using. Keep your albuterol  available to use 2 puffs to be needed for shortness of breath, chest tightness, wheezing. Your flu shot and COVID-19 vaccine are up-to-date You will be due for your repeat screening CT scan of the chest in June 2026. Following our office in June after your CT so we can review the results together.  Please call sooner if you have any problems.

## 2024-03-17 ENCOUNTER — Encounter: Payer: Self-pay | Admitting: Cardiology

## 2024-03-17 NOTE — Telephone Encounter (Signed)
   Patient Name: Kyle Walters  DOB: 08/16/1955 MRN: [id number]  Primary Cardiologist: Newman JINNY Lawrence, MD  Clinical pharmacists have reviewed the patient's past medical history, labs, and current medications as part of preoperative protocol coverage. The following recommendations have been made:   Patient with diagnosis of LV thrombus on Eliquis  for anticoagulation.     Procedure: EGD Date of procedure: 05/11/24  CrCl 82 ml/min Platelet count 188K   Per office protocol, patient can hold Eliquis  for 2 days prior to procedure.  Please resume as soon as safe to do so from a bleeding standpoint.  I will route this recommendation to the requesting party via Epic fax function and remove from pre-op pool.  Please call with questions.  Devone Bonilla D Beatric Fulop, NP 03/17/2024, 4:48 PM

## 2024-03-17 NOTE — Telephone Encounter (Signed)
 Patient with diagnosis of LV thrombus on Eliquis  for anticoagulation.    Procedure: EGD Date of procedure: 05/11/24   CrCl 82 ml/min Platelet count 188K    Per office protocol, patient can hold Eliquis  for 2 days prior to procedure.   .  **This guidance is not considered finalized until pre-operative APP has relayed final recommendations.**

## 2024-03-18 NOTE — Telephone Encounter (Signed)
Patient informed he may hold Eliquis.

## 2024-03-30 ENCOUNTER — Other Ambulatory Visit (HOSPITAL_COMMUNITY)

## 2024-03-31 NOTE — Telephone Encounter (Signed)
 Please review meds and advise. Pt would like to know if lamotrigine 25 mg is both safe and if it will interact with cardiac medication?

## 2024-04-06 ENCOUNTER — Ambulatory Visit (HOSPITAL_COMMUNITY)
Admission: RE | Admit: 2024-04-06 | Discharge: 2024-04-06 | Disposition: A | Source: Ambulatory Visit | Attending: Gastroenterology

## 2024-04-06 DIAGNOSIS — R1319 Other dysphagia: Secondary | ICD-10-CM | POA: Diagnosis present

## 2024-04-07 ENCOUNTER — Ambulatory Visit: Admitting: Adult Health

## 2024-04-07 ENCOUNTER — Ambulatory Visit: Payer: Self-pay | Admitting: Gastroenterology

## 2024-04-07 ENCOUNTER — Encounter: Payer: Self-pay | Admitting: Gastroenterology

## 2024-04-11 ENCOUNTER — Emergency Department (HOSPITAL_COMMUNITY)

## 2024-04-11 ENCOUNTER — Encounter (HOSPITAL_COMMUNITY): Payer: Self-pay

## 2024-04-11 ENCOUNTER — Encounter: Payer: Self-pay | Admitting: Cardiology

## 2024-04-11 ENCOUNTER — Other Ambulatory Visit: Payer: Self-pay

## 2024-04-11 ENCOUNTER — Emergency Department (HOSPITAL_COMMUNITY)
Admission: EM | Admit: 2024-04-11 | Discharge: 2024-04-11 | Disposition: A | Discharge status: Home / Self Care | Attending: Emergency Medicine | Admitting: Emergency Medicine

## 2024-04-11 DIAGNOSIS — R001 Bradycardia, unspecified: Secondary | ICD-10-CM | POA: Insufficient documentation

## 2024-04-11 DIAGNOSIS — G319 Degenerative disease of nervous system, unspecified: Secondary | ICD-10-CM | POA: Diagnosis not present

## 2024-04-11 DIAGNOSIS — R5383 Other fatigue: Secondary | ICD-10-CM | POA: Diagnosis present

## 2024-04-11 DIAGNOSIS — R42 Dizziness and giddiness: Secondary | ICD-10-CM | POA: Insufficient documentation

## 2024-04-11 DIAGNOSIS — Z7901 Long term (current) use of anticoagulants: Secondary | ICD-10-CM | POA: Insufficient documentation

## 2024-04-11 LAB — COMPREHENSIVE METABOLIC PANEL WITH GFR
ALT: 23 U/L (ref 0–44)
AST: 33 U/L (ref 15–41)
Albumin: 4 g/dL (ref 3.5–5.0)
Alkaline Phosphatase: 94 U/L (ref 38–126)
Anion gap: 11 (ref 5–15)
BUN: 12 mg/dL (ref 8–23)
CO2: 23 mmol/L (ref 22–32)
Calcium: 9.2 mg/dL (ref 8.9–10.3)
Chloride: 102 mmol/L (ref 98–111)
Creatinine, Ser: 0.87 mg/dL (ref 0.61–1.24)
GFR, Estimated: >60 mL/min
Glucose, Bld: 84 mg/dL (ref 70–99)
Potassium: 4.5 mmol/L (ref 3.5–5.1)
Sodium: 136 mmol/L (ref 135–145)
Total Bilirubin: 0.4 mg/dL (ref 0.0–1.2)
Total Protein: 6.9 g/dL (ref 6.5–8.1)

## 2024-04-11 LAB — RESP PANEL BY RT-PCR (RSV, FLU A&B, COVID)  RVPGX2
Influenza A by PCR: NEGATIVE
Influenza B by PCR: NEGATIVE
Resp Syncytial Virus by PCR: NEGATIVE
SARS Coronavirus 2 by RT PCR: NEGATIVE

## 2024-04-11 LAB — URINALYSIS, ROUTINE W REFLEX MICROSCOPIC
Bilirubin Urine: NEGATIVE
Glucose, UA: NEGATIVE mg/dL
Ketones, ur: NEGATIVE mg/dL
Leukocytes,Ua: NEGATIVE
Nitrite: NEGATIVE
Protein, ur: NEGATIVE mg/dL
Specific Gravity, Urine: <1.005 — ABNORMAL LOW (ref 1.005–1.030)
pH: 6 (ref 5.0–8.0)

## 2024-04-11 LAB — CBC
HCT: 39.4 % (ref 39.0–52.0)
Hemoglobin: 13.1 g/dL (ref 13.0–17.0)
MCH: 31.7 pg (ref 26.0–34.0)
MCHC: 33.2 g/dL (ref 30.0–36.0)
MCV: 95.4 fL (ref 80.0–100.0)
Platelets: 150 K/uL (ref 150–400)
RBC: 4.13 MIL/uL — ABNORMAL LOW (ref 4.22–5.81)
RDW: 13.2 % (ref 11.5–15.5)
WBC: 5.6 K/uL (ref 4.0–10.5)
nRBC: 0 % (ref 0.0–0.2)

## 2024-04-11 LAB — URINALYSIS, MICROSCOPIC (REFLEX): Bacteria, UA: NONE SEEN

## 2024-04-11 LAB — TSH: TSH: 1.57 u[IU]/mL (ref 0.350–4.500)

## 2024-04-11 LAB — CBG MONITORING, ED: Glucose-Capillary: 91 mg/dL (ref 70–99)

## 2024-04-11 MED ORDER — LACTATED RINGERS IV BOLUS: 500.0000 mL | Freq: Once | INTRAVENOUS | Status: DC

## 2024-04-11 NOTE — Discharge Instructions (Signed)
 Cardura  can be associated with hypotension.  As we discussed it is difficult to say if that is the cause of your symptoms since you have been on it for a long time.  You could consider holding it for a few days to see if that helps with your symptoms.  I have placed a referral to your cardiologist office to help them schedule an appointment for you.  Do call the office if you have not heard from them by Tuesday

## 2024-04-11 NOTE — ED Triage Notes (Signed)
 Pt c.o a few days of confusion, fatigue, dizziness and occasional bradycardia in the 30s. Pt denies SOB or chest pain.

## 2024-04-11 NOTE — ED Notes (Signed)
 CCMD contacted to put patient on cardiac monitoring.

## 2024-04-11 NOTE — ED Provider Triage Note (Signed)
 Emergency Medicine Provider Triage Evaluation Note  Kyle Walters , a 69 y.o. male  was evaluated in triage.  Pt complains of dizzy.  For the past 3 days pt have noticed generalized weakness, sinus congestion, dizziness, forgetfulness, and also notice HR in the 30s according to his fitbit.  No recent change in medication, no fever, chills, cough, n/v/d, dysuria  Review of Systems  Positive: As above Negative: As above  Physical Exam  BP 104/84 (BP Location: Left Arm)   Pulse (!) 55   Temp 97.6 F (36.4 C)   Resp 17   Ht 5' 5 (1.651 m)   Wt 65.8 kg   SpO2 98%   BMI 24.13 kg/m  Gen:   Awake, no distress   Resp:  Normal effort  MSK:   Moves extremities without difficulty  Other:    Medical Decision Making  Medically screening exam initiated at 12:10 PM.  Appropriate orders placed.  Kyle Walters was informed that the remainder of the evaluation will be completed by another provider, this initial triage assessment does not replace that evaluation, and the importance of remaining in the ED until their evaluation is complete.     Kyle Colon, PA-C 04/11/24 1212

## 2024-04-11 NOTE — ED Notes (Signed)
 ED Provider at bedside.

## 2024-04-11 NOTE — ED Provider Notes (Signed)
 " [facility] Provider Note   CSN: [id number] Arrival date & time: 04/11/24  1119     Patient presents with: Dizziness and Fatigue   Kyle Walters is a 68 y.o. male.    Dizziness    Pt states he has had a few nights of getting very exhausted.  Pt states he has noticed his heart rate has dropped into the 30s.  Pt has also been having episodes of feeling dizzy and confused.    No fever.  He has had some headaches recently.  He does have migraines.   Prior to Admission medications  Medication Sig Start Date End Date Taking? Authorizing Provider  albuterol  (VENTOLIN  HFA) 108 (90 Base) MCG/ACT inhaler Inhale 2 puffs into the lungs every 6 (six) hours as needed for wheezing or shortness of breath. 03/12/24   Shelah Lamar RAMAN, MD  ALPRAZolam  (XANAX ) 0.5 MG tablet Take 0.5 mg by mouth every 6 (six) hours. 06/04/23   [provider]  apixaban  (ELIQUIS ) 5 MG TABS tablet Take 1 tablet (5 mg total) by mouth 2 (two) times daily. 01/20/24   Patwardhan, Newman PARAS, MD  ARIPiprazole (ABILIFY) 2 MG tablet  03/03/24   [provider]  Atogepant  (QULIPTA ) 60 MG TABS Take 1 tablet (60 mg total) by mouth daily. 10/02/23   Ines Onetha NOVAK, MD  botulinum toxin Type A  (BOTOX ) 200 units injection INJECT 155 UNITS INTRAMUSCULARLY TO HEAD AND NECK EVERY 3 MONTHS  (DISCARD UNUSED AFTER FIRST USE) Patient taking differently: Inject 155 Units into the muscle every 3 (three) months. INJECT 155 UNITS INTRAMUSCULARLY TO HEAD AND NECK EVERY 3 MONTHS  (DISCARD UNUSED AFTER FIRST USE) 06/24/23   Ines Onetha NOVAK, MD  BREO ELLIPTA  100-25 MCG/ACT AEPB INHALE 1 PUFF BY MOUTH EVERY DAY Patient taking differently: Inhale 1 puff into the lungs daily. 11/11/23   Shelah Lamar RAMAN, MD  COVID-19 mRNA vaccine, Pfizer, (COMIRNATY ) syringe Inject into the muscle. 07/15/23     doxazosin  (CARDURA ) 8 MG tablet Take 8 mg by mouth daily.    [provider]  EPINEPHrine 0.3  mg/0.3 mL IJ SOAJ injection Inject 0.3 mg into the muscle as needed. 10/07/22   [provider]  escitalopram  (LEXAPRO ) 10 MG tablet Take 10 mg by mouth in the morning. 10/26/14   [provider]  gabapentin  (NEURONTIN ) 400 MG capsule Take 400 mg by mouth 4 (four) times daily.     [provider]  Galcanezumab -gnlm (EMGALITY ) 120 MG/ML SOAJ INJECT SUBCUTANEOUSLY THE  CONTENTS OF 1 PEN (120 MG)  MONTHLY AS MAINTENANCE DOSE 02/06/24   Millikan, Megan, NP  memantine  (NAMENDA ) 10 MG tablet TAKE 2 TABLETS BY MOUTH IN THE MORNING AND 1 TABLET IN THE LATE AFTERNOON Patient taking differently: Take 20 mg by mouth See admin instructions. TAKE 2 TABLETS BY MOUTH IN THE MORNING AND 1 TABLET IN THE LATE AFTERNOON 11/06/22   Ines Onetha NOVAK, MD  Nerve Stimulator (NERIVIO) DEVI USE WITHIN 1 HR OF MIGRAINE. SET A STRONG YET COMFORTABLE INTENSITY LEVEL & MAINTAIN LEVEL FOR 45 MINS 01/23/23   Ines Onetha NOVAK, MD  pantoprazole  (PROTONIX ) 40 MG tablet Take 40 mg by mouth daily. 12/16/23   [provider]  rosuvastatin  (CRESTOR ) 20 MG tablet Take 1 tablet (20 mg total) by mouth daily. 11/04/23   Patwardhan, Newman PARAS, MD  silodosin (RAPAFLO) 8 MG CAPS capsule Take 8 mg by mouth daily with breakfast.    [provider]  sucralfate  (CARAFATE ) 1 g tablet Take 1 g by mouth 4 (four) times daily. 12/09/23   [provider]  Ubrogepant  (UBRELVY ) 100 MG TABS TAKE 1 TABLET MOUTH AS NEEDED AS CLOSE TO MIGRAINE ONSET AS POSSIBLE. MAY REPEAT IN 2 HOURS IF NEEDED. MAX 200 MG PER 24 HOURS. 10/02/23   Ines Onetha NOVAK, MD    Allergies: Plasticized base [plastibase], Tetracycline, Triptans, Effexor [venlafaxine hcl], Prednisone, and Sulfamethoxazole-trimethoprim    Review of Systems  Neurological:  Positive for dizziness.    Updated Vital Signs BP 113/60   Pulse (!) 51   Temp (!) 97.4 F (36.3 C) (Oral)   Resp 15   Ht 1.651 m (5' 5)   Wt 65.8 kg   SpO2 98%   BMI 24.13 kg/m    Physical Exam Vitals and nursing note reviewed.  Constitutional:      General: He is not in acute distress.    Appearance: He is well-developed.  HENT:     Head: Normocephalic and atraumatic.     Right Ear: External ear normal.     Left Ear: External ear normal.  Eyes:     General: No visual field deficit or scleral icterus.       Right eye: No discharge.        Left eye: No discharge.     Conjunctiva/sclera: Conjunctivae normal.  Neck:     Trachea: No tracheal deviation.  Cardiovascular:     Rate and Rhythm: Normal rate and regular rhythm.  Pulmonary:     Effort: Pulmonary effort is normal. No respiratory distress.     Breath sounds: Normal breath sounds. No stridor. No wheezing or rales.  Abdominal:     General: Bowel sounds are normal. There is no distension.     Palpations: Abdomen is soft.     Tenderness: There is no abdominal tenderness. There is no guarding or rebound.  Musculoskeletal:        General: No tenderness.     Cervical back: Neck supple.  Skin:    General: Skin is warm and dry.     Findings: No rash.  Neurological:     Mental Status: He is alert and oriented to person, place, and time.     Cranial Nerves: No cranial nerve deficit, dysarthria or facial asymmetry.     Sensory: No sensory deficit.     Motor: No abnormal muscle tone, seizure activity or pronator drift.     Coordination: Coordination normal.     Comments:  able to hold both legs off bed for 5 seconds, sensation intact in all extremities,  no left or right sided neglect, normal finger-nose exam bilaterally, no nystagmus noted   Psychiatric:        Mood and Affect: Mood normal.     (all labs ordered are listed, but only abnormal results are displayed) Labs Reviewed  CBC - Abnormal; Notable for the following components:      Result Value   RBC 4.13 (*)    All other components within normal limits  URINALYSIS, ROUTINE W REFLEX MICROSCOPIC - Abnormal; Notable for the following components:    Specific Gravity, Urine <1.005 (*)    Hgb urine dipstick TRACE (*)    All other components within normal limits  RESP PANEL BY RT-PCR (RSV, FLU A&B, COVID)  RVPGX2  COMPREHENSIVE METABOLIC PANEL WITH GFR  TSH  URINALYSIS, MICROSCOPIC (REFLEX)  CBG MONITORING, ED    EKG: EKG Interpretation Date/Time:  Saturday April 11 2024 11:36:19  EST Ventricular Rate:  51 PR Interval:  152 QRS Duration:  88 QT Interval:  444 QTC Calculation: 409 R Axis:   83  Text Interpretation: Sinus bradycardia Cannot rule out Inferior infarct , age undetermined Anterolateral infarct , age undetermined Abnormal ECG When compared with ECG of 27-Jan-2024 08:19, No significant change since last tracing Confirmed by Randol Simmonds [phone]) on 04/11/2024 12:47:56 PM  Radiology: CT Head Wo Contrast Result Date: 04/11/2024 CLINICAL DATA:  Dizziness and fatigue. EXAM: CT HEAD WITHOUT CONTRAST TECHNIQUE: Contiguous axial images were obtained from the base of the skull through the vertex without intravenous contrast. RADIATION DOSE REDUCTION: This exam was performed according to the departmental dose-optimization program which includes automated exposure control, adjustment of the mA and/or kV according to patient size and/or use of iterative reconstruction technique. COMPARISON:  Head CT and brain MRI 01/27/2024 FINDINGS: Brain: No intracranial hemorrhage, mass effect, or midline shift. Stable age related atrophy. No hydrocephalus. The basilar cisterns are patent. No evidence of territorial infarct or acute ischemia. No extra-axial or intracranial fluid collection. Vascular: Atherosclerosis of skullbase vasculature without hyperdense vessel or abnormal calcification. Skull: No fracture or focal lesion. Sinuses/Orbits: No acute finding.  No mastoid effusion. Other: None. IMPRESSION: 1. No acute intracranial abnormality. 2. Stable age related atrophy. Electronically Signed   By: Andrea Gasman M.D.   On: 04/11/2024 13:58      Procedures   Medications Ordered in the ED  lactated ringers  bolus 500 mL (0 mLs Intravenous Hold 04/11/24 1343)    Clinical Course as of 04/11/24 1933  Sat Apr 11, 2024  1350 IV fluids ordered patient declined [JK]  1416 CT Head Wo Contrast [JK]  1416 Urinalysis, Routine w reflex microscopic -Urine, Clean Catch(!) Normal [JK]  1416 Resp panel by RT-PCR (RSV, Flu A&B, Covid) Anterior Nasal Swab Normal [JK]  1416 Comprehensive metabolic panel Normal [JK]  1416 TSH Normal [JK]  1416 CT Head Wo Contrast No acute abnormalities [JK]    Clinical Course User Index [JK] Randol Simmonds, MD                                 Medical Decision Making Amount and/or Complexity of Data Reviewed Labs:  Decision-making details documented in ED Course. Radiology: ordered. Decision-making details documented in ED Course.   Pt presented with complaints of fatigue.  Noted to have bradycardia on his fitbit watch.  No signs of anemia, AKI, electrolyte abnormality.  TSH normal, hypothyroidism unlikely.  Viral panel. Negative.  No clear sings of infection.  BP borderline low.  Sinus brady here in the ED but similar to previous.  Appears stable for discharge.  Will have pt hold his cardura  to see if sx improve.  Will have pt follow up with cardiologist, consider outpt cardiac monitoring.  Evaluation and diagnostic testing in the emergency department does not suggest an emergent condition requiring admission or immediate intervention beyond what has been performed at this time.  The patient is safe for discharge and has been instructed to return immediately for worsening symptoms, change in symptoms or any other concerns.      Final diagnoses:  Other fatigue  Bradycardia    ED Discharge Orders     None          Randol Simmonds, MD 04/11/24 1933  "

## 2024-04-13 NOTE — Telephone Encounter (Signed)
 I do not see that patient is on any AV nodal blocking agents.  Recommend 2-week Zio monitor to assess heart rhythm trends.  Diagnosis: Bradycardia  Thanks MJP

## 2024-04-14 ENCOUNTER — Ambulatory Visit: Attending: Cardiology

## 2024-04-14 DIAGNOSIS — R001 Bradycardia, unspecified: Secondary | ICD-10-CM

## 2024-04-14 NOTE — Progress Notes (Unsigned)
 Enrolled patient for a 14 day Zio XT  monitor to be mailed to patients home

## 2024-04-21 ENCOUNTER — Telehealth: Payer: Self-pay | Admitting: Adult Health

## 2024-04-21 NOTE — Telephone Encounter (Signed)
 Botox  PA renewal submitted via CMM. Key: [id number]

## 2024-04-27 NOTE — Telephone Encounter (Signed)
 Auth#: [id number] (04/21/24-04/22/25)

## 2024-05-04 ENCOUNTER — Ambulatory Visit: Admitting: Adult Health

## 2024-05-04 ENCOUNTER — Encounter: Payer: Self-pay | Admitting: Gastroenterology

## 2024-05-04 VITALS — BP 100/60 | HR 52

## 2024-05-04 DIAGNOSIS — G43711 Chronic migraine without aura, intractable, with status migrainosus: Secondary | ICD-10-CM

## 2024-05-04 MED ORDER — ONABOTULINUMTOXINA 200 UNITS IJ SOLR: 155.0000 [IU] | Freq: Once | INTRAMUSCULAR | Status: DC

## 2024-05-04 MED ORDER — ONABOTULINUMTOXINA 200 UNITS IJ SOLR
155.0000 [IU] | Freq: Once | INTRAMUSCULAR | Status: AC
  Administered 2024-05-04: 155 [IU] via INTRAMUSCULAR

## 2024-05-04 NOTE — Telephone Encounter (Signed)
 Patient said did not get refill  request in for Botox  injection appt for today. Spoke with Botox  Coordinator to make aware, Coordinator said patient is good we have his medication for his appointment today.

## 2024-05-04 NOTE — Progress Notes (Signed)
 "   05/04/24: Patient reports that his headaches have increased.  Reports 9 migraines last month, 4 migraines so far this month. Now just on emgality . Has ubrevly but not working well.  Has been on Nurtec in the past.  Returns today for Botox  injections.   BOTOX  PROCEDURE NOTE FOR MIGRAINE HEADACHE    Contraindications and precautions discussed with patient(above). Aseptic procedure was observed and patient tolerated procedure. Procedure performed by Duwaine Russell, NP  The condition has existed for more than 6 months, and pt does not have a diagnosis of ALS, Myasthenia Gravis or Lambert-Eaton Syndrome.  Risks and benefits of injections discussed and pt agrees to proceed with the procedure.  Written consent obtained  These injections are medically necessary.  The these injections do not cause sedations or hallucinations which the oral therapies may cause.  Indication/Diagnosis: chronic migraine BOTOX (G9414) injection was performed according to protocol by [employer]. 200 units of BOTOX  was dissolved into 4 cc NS.   NDC: [address]-98  Type of toxin: Botox        Botox - 200 units x 1 vial Lot: [id number] Expiration: 2/28 NDC: 9976-6078-97   Bacteriostatic 0.9% Sodium Chloride - 4 mL  Lot: [id number] Expiration: 07/21/25 NDC: 9590-8033-97   Dx: H56.288        Description of procedure:  The patient was placed in a sitting position. The standard protocol was used for Botox  as follows, with 5 units of Botox  injected at each site:   -Procerus muscle, midline injection  -Corrugator muscle, bilateral injection  -Frontalis muscle, bilateral injection, with 2 sites each side, medial injection was performed in the upper one third of the frontalis muscle, in the region vertical from the medial inferior edge of the superior orbital rim. The lateral injection was again in the upper one third of the forehead vertically above the lateral limbus of the cornea, 1.5 cm lateral to the medial injection  site.  -Temporalis muscle injection, 4 sites, bilaterally. The first injection was 3 cm above the tragus of the ear, second injection site was 1.5 cm to 3 cm up from the first injection site in line with the tragus of the ear. The third injection site was 1.5-3 cm forward between the first 2 injection sites. The fourth injection site was 1.5 cm posterior to the second injection site.  -Occipitalis muscle injection, 3 sites, bilaterally. The first injection was done one half way between the occipital protuberance and the tip of the mastoid process behind the ear. The second injection site was done lateral and superior to the first, 1 fingerbreadth from the first injection. The third injection site was 1 fingerbreadth superiorly and medially from the first injection site.  -Cervical paraspinal muscle injection, 2 sites, bilateral knee first injection site was 1 cm from the midline of the cervical spine, 3 cm inferior to the lower border of the occipital protuberance. The second injection site was 1.5 cm superiorly and laterally to the first injection site.  -Trapezius muscle injection was performed at 3 sites, bilaterally. The first injection site was in the upper trapezius muscle halfway between the inflection point of the neck, and the acromion. The second injection site was one half way between the acromion and the first injection site. The third injection was done between the first injection site and the inflection point of the neck.   Will return for repeat injection in 3 months.   A 200 unit sof Botox  was used, 155 units were injected, the rest of the Botox  was wasted.  The patient tolerated the procedure well, there were no complications of the above procedure.  Duwaine Russell, MSN, NP-C 05/04/2024, 2:40 PM [facility] [address], [address] [place], [place] [address] [phone]  "

## 2024-05-04 NOTE — Progress Notes (Signed)
 Botox - 200 units x 1 vial Lot: [id number] Expiration: 2/28 NDC: 9976-6078-97  Bacteriostatic 0.9% Sodium Chloride - 4 mL  Lot: [id number] Expiration: 07/21/25 NDC: 9590-8033-97  Dx: H56.288 S/P  Witnessed by Rojean LATHER

## 2024-05-11 ENCOUNTER — Ambulatory Visit: Admitting: Gastroenterology

## 2024-05-11 ENCOUNTER — Encounter: Payer: Self-pay | Admitting: Gastroenterology

## 2024-05-11 VITALS — BP 104/56 | HR 47 | Temp 97.5°F | Ht 66.0 in | Wt 152.0 lb

## 2024-05-11 DIAGNOSIS — R1319 Other dysphagia: Secondary | ICD-10-CM

## 2024-05-11 MED ORDER — SODIUM CHLORIDE 0.9 % IV SOLN: 500.0000 mL | INTRAVENOUS | Status: DC

## 2024-05-11 NOTE — Progress Notes (Signed)
 Seen in preprocedure area along with CRNA  Heart rate noted to be in the 40s today, which he says is about his baseline.  He is not having chest pain dyspnea lightheadedness at this time.  Noted to have had an ED visit on 04/11/2024 with mental status change and profound fatigue where he was noted to be in sinus bradycardia at a rate of 51 on EKG (reportedly similar to baseline).  However he told that provider (documented in their note) that his Fitbit had recorded a heart rate in the 30s that day which is part of what prompted them to come to the ED.  ED provider communicated with his cardiologist , and patient underwent a 14-day cardiac monitor which he turned in about 5 days ago.  Most likely not reviewed by cardiology yet because there was no report in the chart and the patient has not received notification about the findings.  EGD for today canceled.  Will communicate with patient's cardiologist for eventual results of that study and further planning regarding endoscopic procedure.  VEAR Brand MD

## 2024-05-11 NOTE — Progress Notes (Signed)
 Procedure cancelled due to cardiac clearance.  Dr. Legrand will be in contact with cardiologist.

## 2024-05-13 ENCOUNTER — Encounter: Payer: Self-pay | Admitting: Emergency Medicine

## 2024-05-13 ENCOUNTER — Ambulatory Visit: Payer: Self-pay | Admitting: Cardiology

## 2024-05-13 ENCOUNTER — Ambulatory Visit
Admission: EM | Admit: 2024-05-13 | Discharge: 2024-05-13 | Disposition: A | Discharge status: Home / Self Care | Attending: Student | Admitting: Student

## 2024-05-13 DIAGNOSIS — H6123 Impacted cerumen, bilateral: Secondary | ICD-10-CM

## 2024-05-13 DIAGNOSIS — R0989 Other specified symptoms and signs involving the circulatory and respiratory systems: Secondary | ICD-10-CM

## 2024-05-13 DIAGNOSIS — J438 Other emphysema: Secondary | ICD-10-CM | POA: Diagnosis not present

## 2024-05-13 DIAGNOSIS — R001 Bradycardia, unspecified: Secondary | ICD-10-CM

## 2024-05-13 LAB — POCT INFLUENZA A/B
Influenza A, POC: NEGATIVE
Influenza B, POC: NEGATIVE

## 2024-05-13 LAB — POC SOFIA SARS ANTIGEN FIA: SARS Coronavirus 2 Ag: NEGATIVE

## 2024-05-13 MED ORDER — ALBUTEROL SULFATE HFA 108 (90 BASE) MCG/ACT IN AERS: 1.0000 | INHALATION_SPRAY | Freq: Four times a day (QID) | RESPIRATORY_TRACT | 0 refills | Status: AC | PRN

## 2024-05-13 MED ORDER — AZITHROMYCIN 250 MG PO TABS: 250.0000 mg | ORAL_TABLET | Freq: Every day | ORAL | 0 refills | Status: AC

## 2024-05-13 NOTE — Discharge Instructions (Addendum)
-  Your covid and influenza tests were negative today -I refilled your albuterol  inhaler today -Albuterol  inhaler as needed for cough, wheezing, shortness of breath, 1 to 2 puffs every 6 hours as needed. -Azithromycin  antibiotic x5 days for emphysema -Continue Breo Ellipta  inhaler daily  ---->Your cough should slowly get better instead of worse. If you develop a cough productive of dark or red sputum, new shortness of breath, new chest tightness, new fevers, etc - seek additional care.

## 2024-05-13 NOTE — ED Provider Notes (Addendum)
 [facility]    CSN: [id number] Arrival date & time: 05/13/24  1642      History   Chief Complaint Chief Complaint  Patient presents with   Nasal Congestion   scratchy throat   Otalgia    HPI Kyle Walters is a 69 y.o. male presenting with sore throat and congestion.  Medical history COPD, emphysema, CAD, fibromyalgia, myocardial infarction, TIA. -Endorses: Left ear pain, chest congestion, nasal congestion, sore throat, myalgias.   -Denies:hearing changes, muffled hearing,  Cough,  N/v/d/abd pain. -Duration of symptoms: 2-3 days -Interventions attempted: tylenol  (last dose 1130) -Out of his albuterol  inhaler right now.  Still doing daily Breo Ellipta  inhaler. -Accompanied by: family member  HPI  Past Medical History:  Diagnosis Date   ADHD (attention deficit hyperactivity disorder)    Allergy    Anemia    Anxiety panic attack   Arthritis    COPD (chronic obstructive pulmonary disease) (HCC)    bronchitis/emphysema   Coronary artery disease    Depression    Emphysema lung (HCC)    Emphysema of lung (HCC)    Enlarged prostate    Fibromyalgia    GERD (gastroesophageal reflux disease)    Hyperlipidemia    Hypertension    IBS (irritable bowel syndrome)    Migraine    Myocardial infarction (HCC)    Panic attack     Patient Active Problem List   Diagnosis Date Noted   TIA (transient ischemic attack) 01/27/2024   Diarrhea 01/27/2024   Ischemic cardiomyopathy 01/20/2024   Lumbar compression fracture (HCC) 08/26/2023   Abnormal echocardiogram 03/26/2023   S/P coronary artery stent placement 03/26/2023   Claudication 03/26/2023   OSA (obstructive sleep apnea) 11/14/2022   Daytime sleepiness 10/23/2022   History of tobacco use 09/07/2021   Left ventricular thrombus 04/03/2021   Hyperlipidemia 03/14/2021   Coronary artery disease    Myocardial infarction (HCC) 09/21/2020   Chronic migraine without aura, with intractable migraine, so stated, with  status migrainosus 07/14/2020   Severe episode of recurrent major depressive disorder, without psychotic features (HCC) 07/14/2020   Hemicrania continua 10/30/2019   Anemia 10/30/2019   Sleep-related bruxism 10/30/2019   Anxiety 05/27/2019   Attention deficit hyperactivity disorder, predominantly inattentive type 05/27/2019   Polyclonal gammopathy determined by serum protein electrophoresis 02/25/2018   Primary osteoarthritis of both feet 12/13/2017   History of BPH 12/13/2017   Family history of psoriasis in mother 12/13/2017   History of gastroesophageal reflux (GERD) 12/13/2017   Anxiety and depression 12/13/2017   Temporomandibular joint disorder 03/29/2017   Bilateral impacted cerumen 02/07/2016   Chronic otitis externa of both ears 02/07/2016   COPD (chronic obstructive pulmonary disease) (HCC) 01/29/2013   CHEST PAIN-PRECORDIAL 11/01/2009   Primary osteoarthritis of both hands 10/12/2009   CHEST PAIN 10/12/2009    Past Surgical History:  Procedure Laterality Date   CARDIAC CATHETERIZATION     CORONARY STENT INTERVENTION N/A 03/14/2021   Procedure: CORONARY STENT INTERVENTION;  Surgeon: Dann Candyce RAMAN, MD;  Location: [facility];  Service: Cardiovascular;  Laterality: N/A;   LEFT HEART CATH AND CORONARY ANGIOGRAPHY N/A 03/14/2021   Procedure: LEFT HEART CATH AND CORONARY ANGIOGRAPHY;  Surgeon: Dann Candyce RAMAN, MD;  Location: [facility] CV [facility];  Service: Cardiovascular;  Laterality: N/A;   NO PAST SURGERIES         Home Medications    Prior to Admission medications  Medication Sig Start Date End Date Taking? Authorizing Provider  albuterol  (  VENTOLIN  HFA) 108 (90 Base) MCG/ACT inhaler Inhale 1-2 puffs into the lungs every 6 (six) hours as needed for wheezing or shortness of breath. 05/13/24  Yes Arlyss Leita BRAVO, PA-C  azithromycin  (ZITHROMAX  Z-PAK) 250 MG tablet Take 1 tablet (250 mg total) by mouth daily. Two pills (500mg ) day 1. One pill per day (250mg )  days 2-5. 05/13/24  Yes Windi Toro E, PA-C  ALPRAZolam  (XANAX ) 0.5 MG tablet Take 0.5 mg by mouth every 6 (six) hours. 06/04/23   [provider]  apixaban  (ELIQUIS ) 5 MG TABS tablet Take 1 tablet (5 mg total) by mouth 2 (two) times daily. 01/20/24   Patwardhan, Manish J, MD  botulinum toxin Type A  (BOTOX ) 200 units injection INJECT 155 UNITS INTRAMUSCULARLY TO HEAD AND NECK EVERY 3 MONTHS  (DISCARD UNUSED AFTER FIRST USE) Patient taking differently: Inject 155 Units into the muscle every 3 (three) months. INJECT 155 UNITS INTRAMUSCULARLY TO HEAD AND NECK EVERY 3 MONTHS  (DISCARD UNUSED AFTER FIRST USE) 06/24/23   Ines Onetha NOVAK, MD  BREO ELLIPTA  100-25 MCG/ACT AEPB INHALE 1 PUFF BY MOUTH EVERY DAY Patient taking differently: Inhale 1 puff into the lungs daily. 11/11/23   Shelah Lamar RAMAN, MD  COVID-19 mRNA vaccine, Pfizer, (COMIRNATY ) syringe Inject into the muscle. 07/15/23     doxazosin  (CARDURA ) 8 MG tablet Take 8 mg by mouth daily.    [provider]  EPINEPHrine 0.3 mg/0.3 mL IJ SOAJ injection Inject 0.3 mg into the muscle as needed. 10/07/22   [provider]  escitalopram  (LEXAPRO ) 10 MG tablet Take 10 mg by mouth in the morning. 10/26/14   [provider]  gabapentin  (NEURONTIN ) 400 MG capsule Take 400 mg by mouth 4 (four) times daily.     [provider]  Galcanezumab -gnlm (EMGALITY ) 120 MG/ML SOAJ INJECT SUBCUTANEOUSLY THE  CONTENTS OF 1 PEN (120 MG)  MONTHLY AS MAINTENANCE DOSE 02/06/24   Millikan, Megan, NP  memantine  (NAMENDA ) 10 MG tablet TAKE 2 TABLETS BY MOUTH IN THE MORNING AND 1 TABLET IN THE LATE AFTERNOON Patient taking differently: Take 20 mg by mouth See admin instructions. TAKE 2 TABLETS BY MOUTH IN THE MORNING AND 1 TABLET IN THE LATE AFTERNOON 11/06/22   Ines Onetha NOVAK, MD  Nerve Stimulator (NERIVIO) DEVI USE WITHIN 1 HR OF MIGRAINE. SET A STRONG YET COMFORTABLE INTENSITY LEVEL & MAINTAIN LEVEL FOR 45 MINS 01/23/23   Ines Onetha NOVAK, MD   pantoprazole  (PROTONIX ) 40 MG tablet Take 40 mg by mouth daily. 12/16/23   [provider]  rosuvastatin  (CRESTOR ) 20 MG tablet Take 1 tablet (20 mg total) by mouth daily. 11/04/23   Patwardhan, Newman PARAS, MD  silodosin (RAPAFLO) 8 MG CAPS capsule Take 8 mg by mouth daily with breakfast.    [provider]  Ubrogepant  (UBRELVY ) 100 MG TABS TAKE 1 TABLET MOUTH AS NEEDED AS CLOSE TO MIGRAINE ONSET AS POSSIBLE. MAY REPEAT IN 2 HOURS IF NEEDED. MAX 200 MG PER 24 HOURS. 10/02/23   Ines Onetha NOVAK, MD    Family History Family History  Problem Relation Age of Onset   Heart disease Mother    Breast cancer Mother    Diabetes Mother    Osteoarthritis Mother    Stroke Mother    Other Father        stomach ulcer   Heart disease Father    Bladder Cancer Father    Diabetes Father    Stroke Father    Hypertension Father  Pancreatic cancer Father    Migraines Father    Irritable bowel syndrome Father    Hypertension Brother    Other Brother        stomach ulcer   Colon cancer Neg Hx    Rectal cancer Neg Hx    Esophageal cancer Neg Hx    Stomach cancer Neg Hx     Social History Social History[1]   Allergies   Plasticized base [plastibase], Tetracycline, Triptans, Effexor [venlafaxine hcl], Prednisone, and Sulfamethoxazole-trimethoprim   Review of Systems Review of Systems  Constitutional:  Negative for appetite change, chills and fever.  HENT:  Positive for congestion and sore throat. Negative for ear pain, rhinorrhea, sinus pressure and sinus pain.   Eyes:  Negative for redness and visual disturbance.  Respiratory:  Negative for cough, chest tightness, shortness of breath and wheezing.   Cardiovascular:  Negative for chest pain and palpitations.  Gastrointestinal:  Negative for abdominal pain, constipation, diarrhea, nausea and vomiting.  Genitourinary:  Negative for dysuria, frequency and urgency.  Musculoskeletal:  Negative for myalgias.  Neurological:   Negative for dizziness, weakness and headaches.  Psychiatric/Behavioral:  Negative for confusion.   All other systems reviewed and are negative.    Physical Exam Triage Vital Signs ED Triage Vitals  Encounter Vitals Group     BP      Girls Systolic BP Percentile      Girls Diastolic BP Percentile      Boys Systolic BP Percentile      Boys Diastolic BP Percentile      Pulse      Resp      Temp      Temp src      SpO2      Weight      Height      Head Circumference      Peak Flow      Pain Score      Pain Loc      Pain Education      Exclude from Growth Chart    No data found.  Updated Vital Signs BP 136/73 (BP Location: Right Arm)   Pulse (!) 54   Temp 97.9 F (36.6 C) (Oral)   Resp 19   Wt 145 lb (65.8 kg)   SpO2 93%   BMI 23.40 kg/m   Visual Acuity Right Eye Distance:   Left Eye Distance:   Bilateral Distance:    Right Eye Near:   Left Eye Near:    Bilateral Near:     Physical Exam Vitals reviewed.  Constitutional:      General: He is not in acute distress.    Appearance: Normal appearance. He is not ill-appearing.  HENT:     Head: Normocephalic and atraumatic.     Right Ear: There is impacted cerumen.     Left Ear: There is impacted cerumen.     Ears:     Comments: Bilateral tympanic membranes are occluded by cerumen.    Nose: Nose normal. No congestion.     Mouth/Throat:     Mouth: Mucous membranes are moist.     Pharynx: Uvula midline. No oropharyngeal exudate or posterior oropharyngeal erythema.     Tonsils: No tonsillar exudate.  Eyes:     Extraocular Movements: Extraocular movements intact.     Pupils: Pupils are equal, round, and reactive to light.  Cardiovascular:     Rate and Rhythm: Normal rate and regular rhythm.     Heart sounds: Normal heart  sounds.  Pulmonary:     Effort: Pulmonary effort is normal.     Breath sounds: Normal breath sounds. No decreased breath sounds, wheezing, rhonchi or rales.  Abdominal:     Palpations:  Abdomen is soft.     Tenderness: There is no abdominal tenderness. There is no guarding or rebound.  Lymphadenopathy:     Cervical: No cervical adenopathy.     Right cervical: No superficial, deep or posterior cervical adenopathy.    Left cervical: No superficial, deep or posterior cervical adenopathy.  Skin:    Comments: No rash   Neurological:     General: No focal deficit present.     Mental Status: He is alert and oriented to person, place, and time.  Psychiatric:        Mood and Affect: Mood normal.        Behavior: Behavior normal.        Thought Content: Thought content normal.        Judgment: Judgment normal.      UC Treatments / Results  Labs (all labs ordered are listed, but only abnormal results are displayed) Labs Reviewed  POC SOFIA SARS ANTIGEN FIA  POCT INFLUENZA A/B    EKG   Radiology LONG TERM MONITOR (3-14 DAYS) Result Date: 05/13/2024 Zio patch monitor 14 days 04/22/2024 - 05/06/2024: Dominant rhythm: Sinus. HR 41-111 bpm. Avg HR 51 bpm, in sinus rhythm w/bundle branch block/IVCD. 1 episode of VT, at 141 bpm for 5 beats. <1% isolated SVE, couplets. <1% isolated VE,  couplet/triplets. No atrial fibrillation/atrial flutter/SVT/high grade AV block, sinus pause >3sec noted. 3 patient triggered events correlated with sinus rhythm and occasional VE.    Procedures Procedures (including critical care time)  Medications Ordered in UC Medications - No data to display  Initial Impression / Assessment and Plan / UC Course  I have reviewed the triage vital signs and the nursing notes.  Pertinent labs & imaging results that were available during my care of the patient were reviewed by me and considered in my medical decision making (see chart for details).     Patient is a pleasant 69 y.o. male presenting with viral URI. The patient is afebrile and nontachycardic.  Antipyretic has not been administered today.  -Covid negative -Influenza negative  Following  discussion, considering history of emphysema, azithromycin  sent.  Also refilled his albuterol  inhaler.  He will continue his Breo Ellipta  inhaler.  Bilateral impacted cerumen.  Following discussion, he prefers to have cerumen disimpaction performed by ENT, so he will follow-up with them.  Creatinine clearance estimated to be 78 mL/min based on 03/2024 CMP, using the Cockcroft-Gault equation.     Final Clinical Impressions(s) / UC Diagnoses   Final diagnoses:  Chest congestion  Other emphysema (HCC)  Bilateral impacted cerumen     Discharge Instructions      -Your covid and influenza tests were negative today -I refilled your albuterol  inhaler today -Albuterol  inhaler as needed for cough, wheezing, shortness of breath, 1 to 2 puffs every 6 hours as needed. -Azithromycin  antibiotic x5 days for emphysema -Continue Breo Ellipta  inhaler daily  ---->Your cough should slowly get better instead of worse. If you develop a cough productive of dark or red sputum, new shortness of breath, new chest tightness, new fevers, etc - seek additional care.    -Coding Level 4 for acute exacerbation of chronic condition and prescription drug management.  ED Prescriptions     Medication Sig Dispense Auth. Provider   azithromycin  (  ZITHROMAX  Z-PAK) 250 MG tablet Take 1 tablet (250 mg total) by mouth daily. Two pills (500mg ) day 1. One pill per day (250mg ) days 2-5. 6 tablet Astin Sayre E, PA-C   albuterol  (VENTOLIN  HFA) 108 (90 Base) MCG/ACT inhaler Inhale 1-2 puffs into the lungs every 6 (six) hours as needed for wheezing or shortness of breath. 1 each Arlyss Leita BRAVO, PA-C      PDMP not reviewed this encounter.    Arlyss Leita BRAVO, PA-C 05/13/24 1740     [1]  Social History Tobacco Use   Smoking status: Former    Current packs/day: 0.00    Average packs/day: 1.5 packs/day for 40.0 years (60.0 ttl pk-yrs)    Types: Cigarettes    Start date: 05/17/1973    Quit date: 05/17/2013    Years  since quitting: 10.9   Smokeless tobacco: Never  Vaping Use   Vaping status: Former  Substance Use Topics   Alcohol use: No    Alcohol/week: 0.0 standard drinks of alcohol   Drug use: No     Arlyss Leita BRAVO, PA-C 05/13/24 1741  "

## 2024-05-13 NOTE — ED Triage Notes (Signed)
 Pt c/o left ear pain for about 2 days. Today he began having chest congestion nasal congestion, scratchy throat.

## 2024-05-15 ENCOUNTER — Other Ambulatory Visit

## 2024-05-19 NOTE — Progress Notes (Signed)
 Resp panel as part of the work up for fatigue and confusion

## 2024-08-03 ENCOUNTER — Ambulatory Visit: Admitting: Adult Health
